# Patient Record
Sex: Female | Born: 1960 | Race: White | Hispanic: No | Marital: Married | Smoking: Former smoker
Health system: Southern US, Community
[De-identification: ages and names within clinical notes are randomized; demographics above are authoritative.]

## PROBLEM LIST (undated history)

## (undated) DIAGNOSIS — E039 Hypothyroidism, unspecified: Secondary | ICD-10-CM

## (undated) DIAGNOSIS — E079 Disorder of thyroid, unspecified: Secondary | ICD-10-CM

## (undated) DIAGNOSIS — D6800 Von Willebrand disease, unspecified: Secondary | ICD-10-CM

---

## 1993-12-20 HISTORY — PX: CYST REMOVAL NECK: SHX6281

## 1999-06-15 ENCOUNTER — Ambulatory Visit (HOSPITAL_COMMUNITY): Admission: RE | Admit: 1999-06-15 | Discharge: 1999-06-15 | Payer: Self-pay | Admitting: *Deleted

## 2000-01-29 ENCOUNTER — Inpatient Hospital Stay (HOSPITAL_COMMUNITY): Admission: AD | Admit: 2000-01-29 | Discharge: 2000-01-29 | Payer: Self-pay | Admitting: Gynecology

## 2001-10-11 ENCOUNTER — Other Ambulatory Visit: Admission: RE | Admit: 2001-10-11 | Discharge: 2001-10-11 | Payer: Self-pay | Admitting: Gynecology

## 2003-03-06 ENCOUNTER — Encounter: Admission: RE | Admit: 2003-03-06 | Discharge: 2003-03-06 | Payer: Self-pay | Admitting: Family Medicine

## 2003-03-06 ENCOUNTER — Encounter: Payer: Self-pay | Admitting: Family Medicine

## 2003-12-09 ENCOUNTER — Other Ambulatory Visit: Admission: RE | Admit: 2003-12-09 | Discharge: 2003-12-09 | Payer: Self-pay | Admitting: Gynecology

## 2005-03-24 ENCOUNTER — Other Ambulatory Visit: Admission: RE | Admit: 2005-03-24 | Discharge: 2005-03-24 | Payer: Self-pay | Admitting: Gynecology

## 2006-04-24 ENCOUNTER — Emergency Department (HOSPITAL_COMMUNITY): Admission: EM | Admit: 2006-04-24 | Discharge: 2006-04-24 | Payer: Self-pay | Admitting: Family Medicine

## 2006-05-24 ENCOUNTER — Other Ambulatory Visit: Admission: RE | Admit: 2006-05-24 | Discharge: 2006-05-24 | Payer: Self-pay | Admitting: Gynecology

## 2006-11-11 ENCOUNTER — Inpatient Hospital Stay (HOSPITAL_COMMUNITY): Admission: AD | Admit: 2006-11-11 | Discharge: 2006-11-11 | Payer: Self-pay | Admitting: Gynecology

## 2008-01-16 ENCOUNTER — Other Ambulatory Visit: Admission: RE | Admit: 2008-01-16 | Discharge: 2008-01-16 | Payer: Self-pay | Admitting: Gynecology

## 2009-03-12 ENCOUNTER — Encounter: Payer: Self-pay | Admitting: Cardiology

## 2009-03-12 ENCOUNTER — Ambulatory Visit: Payer: Self-pay

## 2009-03-14 ENCOUNTER — Ambulatory Visit: Payer: Self-pay | Admitting: Cardiology

## 2009-03-14 ENCOUNTER — Encounter: Payer: Self-pay | Admitting: Cardiology

## 2009-03-14 DIAGNOSIS — Z862 Personal history of diseases of the blood and blood-forming organs and certain disorders involving the immune mechanism: Secondary | ICD-10-CM | POA: Insufficient documentation

## 2009-03-14 DIAGNOSIS — Z8639 Personal history of other endocrine, nutritional and metabolic disease: Secondary | ICD-10-CM | POA: Insufficient documentation

## 2009-03-14 DIAGNOSIS — R002 Palpitations: Secondary | ICD-10-CM | POA: Insufficient documentation

## 2009-03-14 LAB — CONVERTED CEMR LAB: TSH: 0.75 microintl units/mL (ref 0.35–5.50)

## 2010-09-22 ENCOUNTER — Encounter: Admission: RE | Admit: 2010-09-22 | Discharge: 2010-09-22 | Payer: Self-pay | Admitting: Internal Medicine

## 2011-01-10 ENCOUNTER — Encounter: Payer: Self-pay | Admitting: Internal Medicine

## 2011-12-23 ENCOUNTER — Emergency Department (HOSPITAL_COMMUNITY): Payer: BC Managed Care – PPO

## 2011-12-23 ENCOUNTER — Emergency Department (HOSPITAL_COMMUNITY)
Admission: EM | Admit: 2011-12-23 | Discharge: 2011-12-23 | Disposition: A | Discharge status: Home / Self Care | Payer: BC Managed Care – PPO | Attending: Emergency Medicine | Admitting: Emergency Medicine

## 2011-12-23 ENCOUNTER — Encounter: Payer: Self-pay | Admitting: Emergency Medicine

## 2011-12-23 DIAGNOSIS — R6883 Chills (without fever): Secondary | ICD-10-CM | POA: Insufficient documentation

## 2011-12-23 DIAGNOSIS — R0602 Shortness of breath: Secondary | ICD-10-CM | POA: Insufficient documentation

## 2011-12-23 DIAGNOSIS — J3489 Other specified disorders of nose and nasal sinuses: Secondary | ICD-10-CM | POA: Insufficient documentation

## 2011-12-23 DIAGNOSIS — J329 Chronic sinusitis, unspecified: Secondary | ICD-10-CM | POA: Insufficient documentation

## 2011-12-23 DIAGNOSIS — R059 Cough, unspecified: Secondary | ICD-10-CM | POA: Insufficient documentation

## 2011-12-23 DIAGNOSIS — E079 Disorder of thyroid, unspecified: Secondary | ICD-10-CM | POA: Insufficient documentation

## 2011-12-23 DIAGNOSIS — J40 Bronchitis, not specified as acute or chronic: Secondary | ICD-10-CM

## 2011-12-23 DIAGNOSIS — R05 Cough: Secondary | ICD-10-CM | POA: Insufficient documentation

## 2011-12-23 DIAGNOSIS — R079 Chest pain, unspecified: Secondary | ICD-10-CM | POA: Insufficient documentation

## 2011-12-23 HISTORY — DX: Disorder of thyroid, unspecified: E07.9

## 2011-12-23 MED ORDER — PREDNISONE 10 MG PO TABS: 20.0000 mg | ORAL_TABLET | Freq: Every day | ORAL | Status: DC

## 2011-12-23 MED ORDER — PREDNISONE 20 MG PO TABS
60.0000 mg | ORAL_TABLET | Freq: Once | ORAL | Status: AC
  Administered 2011-12-23: 60 mg via ORAL
  Filled 2011-12-23: qty 3

## 2011-12-23 NOTE — ED Provider Notes (Signed)
History     CSN: [id number]  Arrival date & time 12/23/11  2130   First MD Initiated Contact with Patient 12/23/11 0715      Chief Complaint  Patient presents with  . Cough  . Nasal Congestion    (Consider location/radiation/quality/duration/timing/severity/associated sxs/prior treatment) Patient is a 51 y.o. female presenting with cough. The history is provided by the patient and the spouse.  Cough This is a recurrent problem. The current episode started more than 1 week ago. The problem occurs constantly. The problem has been gradually worsening. The cough is productive of sputum. The maximum temperature recorded prior to her arrival was 100 to 100.9 F. The fever has been present for 5 days or more. Associated symptoms include chest pain, chills and shortness of breath. Pertinent negatives include no ear pain, no headaches, no sore throat, no myalgias, no wheezing and no eye redness. Her past medical history is significant for bronchitis.    Past Medical History  Diagnosis Date  . Thyroid disease     History reviewed. No pertinent past surgical history.  No family history on file.  History  Substance Use Topics  . Smoking status: Not on file  . Smokeless tobacco: Not on file  . Alcohol Use:     OB History    Grav Para Term Preterm Abortions TAB SAB Ect Mult Living                  Review of Systems  Constitutional: Positive for chills.  HENT: Positive for congestion and sinus pressure. Negative for ear pain, sore throat and neck pain.   Eyes: Negative for redness.  Respiratory: Positive for cough and shortness of breath. Negative for wheezing.   Cardiovascular: Positive for chest pain.  Gastrointestinal: Negative for nausea, vomiting, abdominal pain and diarrhea.  Genitourinary: Negative for dysuria.  Musculoskeletal: Negative for myalgias and back pain.  Skin: Negative for rash.  Neurological: Negative for headaches.  Hematological: Does not bruise/bleed  easily.    Allergies  Levofloxacin and Sulfonamide derivatives  Home Medications   Current Outpatient Rx  Name Route Sig Dispense Refill  . ALBUTEROL SULFATE (2.5 MG/3ML) 0.083% IN NEBU Nebulization Take 2.5 mg by nebulization every 6 (six) hours as needed.      Marland Kitchen CIPROFLOXACIN HCL 500 MG PO TABS Oral Take 500 mg by mouth 2 (two) times daily.      Marland Kitchen PREDNISONE 10 MG PO TABS Oral Take 2 tablets (20 mg total) by mouth daily. 10 tablet 0    BP 129/81  Pulse 91  Temp(Src) 99 F (37.2 C) (Oral)  Resp 16  Wt 110 lb (49.896 kg)  SpO2 99%  Physical Exam  Nursing note and vitals reviewed. Constitutional: She is oriented to person, place, and time. She appears well-developed and well-nourished.  HENT:  Head: Normocephalic and atraumatic.  Mouth/Throat: Oropharynx is clear and moist.  Eyes: Conjunctivae and EOM are normal. Pupils are equal, round, and reactive to light.  Neck: Normal range of motion. Neck supple.  Cardiovascular: Normal rate, regular rhythm and normal heart sounds.   No murmur heard. Pulmonary/Chest: Effort normal and breath sounds normal. She has no wheezes.  Abdominal: Soft. Bowel sounds are normal. There is no tenderness.  Musculoskeletal: Normal range of motion.  Neurological: She is alert and oriented to person, place, and time. No cranial nerve deficit. She exhibits normal muscle tone. Coordination normal.  Skin: Skin is warm. No rash noted.    ED Course  Procedures (including  critical care time)  Labs Reviewed - No data to display Dg Chest 2 View  12/23/2011  *RADIOLOGY REPORT*  Clinical Data: Congestion.  CHEST - 2 VIEW  Comparison: None.  Findings: A mild pectus excavatum deformity. Midline trachea. Normal heart size and mediastinal contours.  Patient minimally rotated to the left. No pleural effusion or pneumothorax.  Diffuse peribronchial thickening.  Clear lungs.  IMPRESSION: 1.  No acute cardiopulmonary disease. 2.  Mild peribronchial thickening which  may relate to chronic bronchitis or smoking.  Original Report Authenticated By: Consuello Bossier, M.D.     1. Bronchitis   2. Sinusitis       MDM   Patient with persistent cough and sinusitis like symptoms since Thanksgiving has been on multiple antibiotics since that time, to include Z-Pak doxycycline and Augmentin and currently now Cipro. 2 weeks ago had a course of prednisone for 5 days. Seen by her primary care provider on Monday and started on Cipro and got a shot of steroid. Patient presented today because she had significant persistent coughing overnight and was having trouble getting her breath. He has recently in the last 5-7 days started with fever and chills. No significant bodyaches no nausea vomiting or diarrhea. Chest x-ray today is negative for pneumonia. Patient already using albuterol inhaler. Suspect persistent bronchitis due to the duration possible consideration for pertussis patient will follow up with her primary care provider for this. We'll give second course of prednisone starting today 60 mg by mouth given in the emergency department a five-day course take at home. Patient using Tussionex, will consider using Robitussin-DM in place of that.        Shelda Jakes, MD 12/23/11 [phone]

## 2011-12-23 NOTE — ED Notes (Signed)
Pt alert, nad, c/o cough, congestion, seen PCP on Monday, placed on Cipro, breathing tx tid, resp even unlabored, skin pwd, moist npc noted

## 2016-05-31 ENCOUNTER — Institutional Professional Consult (permissible substitution): Payer: Self-pay | Admitting: Pulmonary Disease

## 2016-07-13 ENCOUNTER — Encounter: Payer: Self-pay | Admitting: Pulmonary Disease

## 2016-07-13 ENCOUNTER — Ambulatory Visit (INDEPENDENT_AMBULATORY_CARE_PROVIDER_SITE_OTHER): Payer: Self-pay | Admitting: Pulmonary Disease

## 2016-07-13 VITALS — BP 114/76 | HR 66 | Ht 63.75 in

## 2016-07-13 DIAGNOSIS — R05 Cough: Secondary | ICD-10-CM

## 2016-07-13 DIAGNOSIS — R490 Dysphonia: Secondary | ICD-10-CM

## 2016-07-13 DIAGNOSIS — J4520 Mild intermittent asthma, uncomplicated: Secondary | ICD-10-CM | POA: Insufficient documentation

## 2016-07-13 DIAGNOSIS — R059 Cough, unspecified: Secondary | ICD-10-CM | POA: Insufficient documentation

## 2016-07-13 NOTE — Assessment & Plan Note (Signed)
Because of her lengthy smoking history and the hoarseness she has been experiencing in the last several months I would like for her to see her nose and throat to evaluate her larynx. This may also shed some light on the reason why her cough persists for so long.

## 2016-07-13 NOTE — Patient Instructions (Signed)
We will refer you to ENT to evaluate the hoarseness We will call you with the results of the CT chest We will see you back in 6-8 weeks or sooner if needed

## 2016-07-13 NOTE — Progress Notes (Signed)
Subjective:    Patient ID: Rachel Lucas, female    DOB: Sep 01, 1961, 55 y.o.   MRN: [id number]  HPI Chief Complaint  Patient presents with  . Advice Only    referred by Dr. Daleen Squibb for chronic cough.  pt states s/s have been present intermittently Xapprox 2 years.     Rachel Lucas has been referred to me by Dr. Daleen Squibb for recurrent cough.  She says that whenever she gets a cold she will end up having "bronchitis" which she describes as having chest tightness some mucus production and a frequent persistent cough.  She recently had a strep throat infection which eventually led to a cough with ongoing mucus production. This is emblematic of multiple recent episode she's had over the years where she will get an upper respiratory infection that leads to cough.  She feels OK between episodes of cough.  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  Used to smoke 3 cigarettes a day smoked from 25 years, quit 1991; never smoked 3-4 ciegarettes a day.  She had "croup" when she was a kid and she has had recurrent cough ever since.  She was hospitalized and was in an oxygen tent for this around age [age].  She has a family history of asthma in her mother, some other distant relatives with asthma.  She does not have a diagnosis of asthma but she has been given albuterol in the past to help with the cough. It works, but she doesn't like the way it makes her feel.    She will sometimes feel "panicky" when her throat is full of mucus.  She has been hoarse for the last 4-6 months or so.    Past Medical History:  Diagnosis Date  . Thyroid disease      Family History  Problem Relation Age of Onset  . Asthma Mother   . Eczema Mother      Social History   Social History  . Marital status: Married    Spouse name: N/A  . Number of children: N/A  . Years of education: N/A   Occupational History  . Not on file.   Social History Main Topics  . Smoking  status: Former Smoker    Packs/day: 0.20    Years: 10.00    Types: Cigarettes    Quit date: 12/20/1989  . Smokeless tobacco: Not on file     Comment: Pt also notes passive exposure- father smoked in house growing up.   . Alcohol use Not on file  . Drug use: Unknown  . Sexual activity: Not on file   Other Topics Concern  . Not on file   Social History Narrative  . No narrative on file     Allergies  Allergen Reactions  . Levofloxacin   . Sulfonamide Derivatives      Outpatient Medications Prior to Visit  Medication Sig Dispense Refill  . albuterol (PROVENTIL) (2.5 MG/3ML) 0.083% nebulizer solution Take 2.5 mg by nebulization every 6 (six) hours as needed.      . ciprofloxacin (CIPRO) 500 MG tablet Take 500 mg by mouth 2 (two) times daily.      . predniSONE (DELTASONE) 10 MG tablet Take 2 tablets (20 mg total) by mouth daily. 10 tablet 0   No facility-administered medications prior to visit.       Review of Systems  Constitutional: Negative for fever and unexpected weight change.  HENT: Negative for congestion, dental problem, ear  pain, nosebleeds, postnasal drip, rhinorrhea, sinus pressure, sneezing, sore throat and trouble swallowing.   Eyes: Negative for redness and itching.  Respiratory: Negative for cough, chest tightness, shortness of breath and wheezing.   Cardiovascular: Negative for palpitations and leg swelling.  Gastrointestinal: Negative for nausea and vomiting.  Genitourinary: Negative for dysuria.  Musculoskeletal: Negative for joint swelling.  Skin: Negative for rash.  Neurological: Negative for headaches.  Hematological: Does not bruise/bleed easily.  Psychiatric/Behavioral: Negative for dysphoric mood. The patient is not nervous/anxious.        Objective:   Physical Exam  Vitals:   07/13/16 1448  BP: 114/76  Pulse: 66  SpO2: 98%  Height: 5' 3.75" (1.619 m)   RA  Gen: well appearing, no acute distress HENT: NCAT, OP clear, neck supple  without masses Eyes: PERRL, EOMi Lymph: no cervical lymphadenopathy PULM: CTA B CV: RRR, no mgr, no JVD GI: BS+, soft, nontender, no hsm Derm: no rash or skin breakdown MSK: normal bulk and tone Neuro: A&Ox4, CN II-XII intact, strength 5/5 in all 4 extremities Psyche: normal mood and affect  2013 chest x-ray images personally reviewed showing normal-appearing lung parenchyma with the exception of some mild pulmonary wall thickening.        Assessment & Plan:  Cough She describes years of persistent coughing episodes after upper respiratory infections. Objectively her lungs are clear to auscultation but she has a chest x-ray from 2013 which shows some airway thickening.  It's difficult to say that she has an underlying lung condition contributing, but with her history of a severe childhood respiratory illness and the abnormal chest x-ray I do worry about the possibility of bronchiectasis. I also think that there is a significant amount of postnasal drip contributing to the duration of her cough.  Given the recent hoarseness, with her distant smoking history I think it's reasonable for her to have an ear nose and throat evaluation.  She has no dyspnea or wheeze in between these episodes so I do not strongly suspect an underlying lung disease like COPD.  Plan: High-resolution CT scan of the chest to evaluate for bronchiectasis See hoarseness below  Hoarseness Because of her lengthy smoking history and the hoarseness she has been experiencing in the last several months I would like for her to see her nose and throat to evaluate her larynx. This may also shed some light on the reason why her cough persists for so long.    Current Outpatient Prescriptions:  .  estradiol (VIVELLE-DOT) 0.05 MG/24HR patch, Place 1 patch onto the skin 2 (two) times a week., Disp: , Rfl:  .  levothyroxine (SYNTHROID) 88 MCG tablet, Take 88 mcg by mouth daily before breakfast., Disp: , Rfl:  .   progesterone (PROMETRIUM) 200 MG capsule, Take 200 mg by mouth daily., Disp: , Rfl:

## 2016-07-13 NOTE — Assessment & Plan Note (Signed)
She describes years of persistent coughing episodes after upper respiratory infections. Objectively her lungs are clear to auscultation but she has a chest x-ray from 2013 which shows some airway thickening.  It's difficult to say that she has an underlying lung condition contributing, but with her history of a severe childhood respiratory illness and the abnormal chest x-ray I do worry about the possibility of bronchiectasis. I also think that there is a significant amount of postnasal drip contributing to the duration of her cough.  Given the recent hoarseness, with her distant smoking history I think it's reasonable for her to have an ear nose and throat evaluation.  She has no dyspnea or wheeze in between these episodes so I do not strongly suspect an underlying lung disease like COPD.  Plan: High-resolution CT scan of the chest to evaluate for bronchiectasis See hoarseness below

## 2016-07-19 ENCOUNTER — Encounter (INDEPENDENT_AMBULATORY_CARE_PROVIDER_SITE_OTHER): Payer: Self-pay

## 2016-07-19 ENCOUNTER — Ambulatory Visit (INDEPENDENT_AMBULATORY_CARE_PROVIDER_SITE_OTHER)
Admission: RE | Admit: 2016-07-19 | Discharge: 2016-07-19 | Disposition: A | Discharge status: Home / Self Care | Payer: 59 | Source: Ambulatory Visit | Attending: Pulmonary Disease | Admitting: Pulmonary Disease

## 2016-07-19 ENCOUNTER — Inpatient Hospital Stay: Admission: RE | Admit: 2016-07-19 | Payer: Self-pay | Source: Ambulatory Visit

## 2016-07-19 DIAGNOSIS — R05 Cough: Secondary | ICD-10-CM

## 2016-07-19 DIAGNOSIS — R059 Cough, unspecified: Secondary | ICD-10-CM

## 2016-07-22 ENCOUNTER — Telehealth: Payer: Self-pay | Admitting: Pulmonary Disease

## 2016-07-22 DIAGNOSIS — R05 Cough: Secondary | ICD-10-CM

## 2016-07-22 DIAGNOSIS — R059 Cough, unspecified: Secondary | ICD-10-CM

## 2016-07-22 NOTE — Telephone Encounter (Signed)
Called and spoke with pt and she is aware of ct results per BQ.  Pt requested that a copy of this be mailed to her and this has been done.  I have ordered her PFT and pt is aware that we will call her to schedule this.

## 2016-07-22 NOTE — Telephone Encounter (Signed)
[phone] pt calling back very nervous

## 2016-08-19 ENCOUNTER — Ambulatory Visit (HOSPITAL_COMMUNITY)
Admission: RE | Admit: 2016-08-19 | Discharge: 2016-08-19 | Disposition: A | Discharge status: Home / Self Care | Payer: 59 | Source: Ambulatory Visit | Attending: Pulmonary Disease | Admitting: Pulmonary Disease

## 2016-08-19 DIAGNOSIS — R059 Cough, unspecified: Secondary | ICD-10-CM

## 2016-08-19 DIAGNOSIS — R05 Cough: Secondary | ICD-10-CM | POA: Insufficient documentation

## 2016-08-19 LAB — PULMONARY FUNCTION TEST
DL/VA % PRED: 123 %
DL/VA: 5.76 ml/min/mmHg/L
DLCO UNC % PRED: 108 %
DLCO UNC: 24.89 ml/min/mmHg
FEF 25-75 POST: 2.5 L/s
FEF 25-75 PRE: 1.82 L/s
FEF2575-%Change-Post: 37 %
FEF2575-%PRED-PRE: 70 %
FEF2575-%Pred-Post: 96 %
FEV1-%Change-Post: 9 %
FEV1-%PRED-POST: 93 %
FEV1-%Pred-Pre: 85 %
FEV1-Post: 2.49 L
FEV1-Pre: 2.27 L
FEV1FVC-%Change-Post: 4 %
FEV1FVC-%PRED-PRE: 95 %
FEV6-%CHANGE-POST: 4 %
FEV6-%Pred-Post: 95 %
FEV6-%Pred-Pre: 91 %
FEV6-POST: 3.12 L
FEV6-Pre: 2.97 L
FEV6FVC-%CHANGE-POST: 0 %
FEV6FVC-%PRED-POST: 102 %
FEV6FVC-%Pred-Pre: 103 %
FVC-%Change-Post: 4 %
FVC-%PRED-PRE: 88 %
FVC-%Pred-Post: 92 %
FVC-POST: 3.12 L
FVC-PRE: 2.97 L
PRE FEV1/FVC RATIO: 76 %
Post FEV1/FVC ratio: 80 %
Post FEV6/FVC ratio: 100 %
Pre FEV6/FVC Ratio: 100 %
RV % pred: 79 %
RV: 1.42 L
TLC % PRED: 90 %
TLC: 4.43 L

## 2016-08-19 MED ORDER — ALBUTEROL SULFATE (2.5 MG/3ML) 0.083% IN NEBU
2.5000 mg | INHALATION_SOLUTION | Freq: Once | RESPIRATORY_TRACT | Status: AC
  Administered 2016-08-19: 2.5 mg via RESPIRATORY_TRACT

## 2016-08-30 ENCOUNTER — Encounter (INDEPENDENT_AMBULATORY_CARE_PROVIDER_SITE_OTHER): Payer: Self-pay

## 2016-08-30 ENCOUNTER — Encounter: Payer: Self-pay | Admitting: Pulmonary Disease

## 2016-08-30 ENCOUNTER — Ambulatory Visit (INDEPENDENT_AMBULATORY_CARE_PROVIDER_SITE_OTHER): Payer: 59 | Admitting: Pulmonary Disease

## 2016-08-30 VITALS — BP 132/72 | HR 62 | Ht 63.75 in

## 2016-08-30 DIAGNOSIS — R059 Cough, unspecified: Secondary | ICD-10-CM

## 2016-08-30 DIAGNOSIS — R05 Cough: Secondary | ICD-10-CM | POA: Diagnosis not present

## 2016-08-30 DIAGNOSIS — R0602 Shortness of breath: Secondary | ICD-10-CM

## 2016-08-30 DIAGNOSIS — J4520 Mild intermittent asthma, uncomplicated: Secondary | ICD-10-CM

## 2016-08-30 MED ORDER — ALBUTEROL SULFATE HFA 108 (90 BASE) MCG/ACT IN AERS
2.0000 | INHALATION_SPRAY | Freq: Four times a day (QID) | RESPIRATORY_TRACT | 6 refills | Status: DC | PRN
Start: 2016-08-30 — End: 2024-08-08

## 2016-08-30 MED ORDER — ALBUTEROL SULFATE (2.5 MG/3ML) 0.083% IN NEBU: 2.5000 mg | INHALATION_SOLUTION | Freq: Four times a day (QID) | RESPIRATORY_TRACT | 5 refills | Status: DC | PRN

## 2016-08-30 NOTE — Patient Instructions (Signed)
If you get a simple upper respiratory infection (cough or cold without significant mucus production, fever, or shortness of breath): for sinus congestion I recommend that she use saline rinses and or phenylephrine  For cough I recommend that she take dextromethorphan twice a day  If your cough does not improve with these measures then call us and we can call in something like Tessalon to help  When you are ill I recommend that you use albuterol as needed for chest tightness or shortness of breath  We will see back in 6 months or sooner if needed

## 2016-08-30 NOTE — Assessment & Plan Note (Signed)
Her lung function testing from this year showed mild small airways obstruction that improved with a bronchodilator and her high-resolution CT scan of the chest also was suggestive of small airways disease. Given the intermittent nature of her symptoms I think this is most consistent with mild intermittent asthma.  Her symptoms only seem to flareup when she hasn't acute cough or cold.  Plan: Use albuterol as needed for shortness of breath or chest tightness Refused Flu shot

## 2016-08-30 NOTE — Progress Notes (Signed)
   Subjective:    Patient ID: Rachel Lucas, female    DOB: 02/26/1961, 55 y.o.   MRN: [id number]  Synopsis: Referred in July 2017 for chronic cough. High-resolution CT scan of the chest showed no evidence of interstitial lung disease or bronchiectasis, there was a suggestion of small airways disease Lung function testing from August 2017 showed small airway obstruction change with bronchodilator only  HPI  Chief Complaint  Patient presents with  . Follow-up    review CT, pft, and ENT visit.  Pt's cough has improved since last ov.    She has been doing well since the last visit, no cough, cold, or shortness of breath. She saw ear nose and throat and they said that there is a small nodule on her larynx but nothing else. She has not had a cough or cold since the last visit.   Past Medical History:  Diagnosis Date  . Thyroid disease      Review of Systems     Objective:   Physical Exam Vitals:   08/30/16 1418  BP: 132/72  Pulse: 62  SpO2: 100%  Height: 5' 3.75" (1.619 m)   RA  Gen: well appearing HENT: OP clear, TM's clear, neck supple PULM: CTA B, normal percussion CV: RRR, no mgr, trace edema GI: BS+, soft, nontender Derm: no cyanosis or rash Psyche: normal mood and affect        Assessment & Plan:  Mild intermittent asthma Her lung function testing from this year showed mild small airways obstruction that improved with a bronchodilator and her high-resolution CT scan of the chest also was suggestive of small airways disease. Given the intermittent nature of her symptoms I think this is most consistent with mild intermittent asthma.  Her symptoms only seem to flareup when she hasn't acute cough or cold.  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 Today we talked about whenever she gets a cough or cold she should take  over-the-counter cold remedies to treat sinus congestion or cough which may contribute to ongoing irritation and inflammation in her vocal cords. Fortunately, the ear nose and throat evaluation showed no evidence of vocal cord pathology.      Current Outpatient Prescriptions:  .  estradiol (VIVELLE-DOT) 0.05 MG/24HR patch, Place 1 patch onto the skin 2 (two) times a week., Disp: , Rfl:  .  levothyroxine (SYNTHROID) 88 MCG tablet, Take 88 mcg by mouth daily before breakfast., Disp: , Rfl:  .  progesterone (PROMETRIUM) 200 MG capsule, Take 200 mg by mouth daily., Disp: , Rfl:  .  albuterol (PROVENTIL HFA;VENTOLIN HFA) 108 (90 Base) MCG/ACT inhaler, Inhale 2 puffs into the lungs every 6 (six) hours as needed for wheezing or shortness of breath., Disp: 1 Inhaler, Rfl: 6 .  albuterol (PROVENTIL) (2.5 MG/3ML) 0.083% nebulizer solution, Take 3 mLs (2.5 mg total) by nebulization every 6 (six) hours as needed for wheezing or shortness of breath., Disp: 360 mL, Rfl: 5

## 2016-08-30 NOTE — Assessment & Plan Note (Signed)
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 Today we talked about whenever she gets a cough or cold she should take over-the-counter cold remedies to treat sinus congestion or cough which may contribute to ongoing irritation and inflammation in her vocal cords. Fortunately, the ear nose and throat evaluation showed no evidence of vocal cord pathology.

## 2016-08-30 NOTE — Addendum Note (Signed)
Addended by: Velvet BatheAULFIELD, ASHLEY L on: 08/30/2016 03:37 PM   Modules accepted: Orders

## 2016-09-29 ENCOUNTER — Telehealth: Payer: Self-pay | Admitting: Pulmonary Disease

## 2016-09-29 MED ORDER — AZITHROMYCIN 250 MG PO TABS: ORAL_TABLET | ORAL | 0 refills | Status: DC

## 2016-09-29 NOTE — Telephone Encounter (Signed)
lmtcb X1 for pt to make aware of recs. [facility]

## 2016-09-29 NOTE — Telephone Encounter (Signed)
Pt aware of rx sent and nothing more needed at this time.

## 2016-09-29 NOTE — Telephone Encounter (Signed)
Spoke with the pt  She states developed a cold a few wks ago and is having some facial pressure for the past few days  She is coughing with yellow sputum  She is using saline spray and otc cold med (unsure of name)  With no relief  She states that she believes she has sinus infection b/c her teeth and face hurt and wants abx called in  Please advise thanks!

## 2016-09-29 NOTE — Telephone Encounter (Signed)
zpack

## 2017-02-15 ENCOUNTER — Encounter: Payer: Self-pay | Admitting: Obstetrics and Gynecology

## 2017-05-08 IMAGING — CT CT CHEST HIGH RESOLUTION W/O CM
2 of 6 series · 15 of 36 positions shown, 18 images · non-contrast
Comparison: No priors.

CLINICAL DATA: 54-year-old female with history of cough for 1 year.
Prior history of croup. Former smoker.

EXAM:
CT CHEST WITHOUT CONTRAST
TECHNIQUE: Multidetector CT imaging of the chest was performed following the
standard protocol without intravenous contrast. High resolution
imaging of the lungs, as well as inspiratory and expiratory imaging,
was performed.

[Series 4: high resolution · axial · 0.58mm/px · z∈[+1080,+1340]mm · 12 of 148 slices shown, 15 images]
[im 9/148  mediastinal]
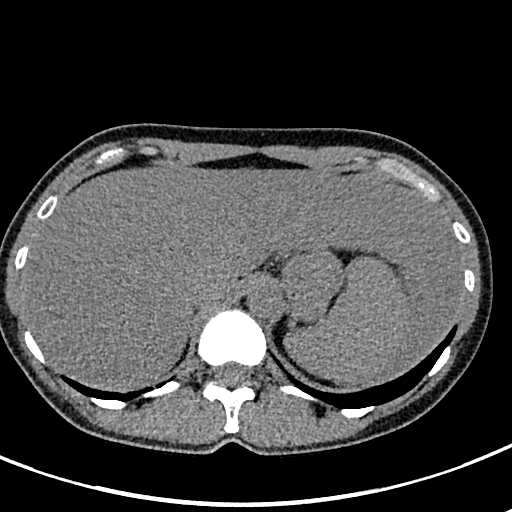
[im 9/148  lung]
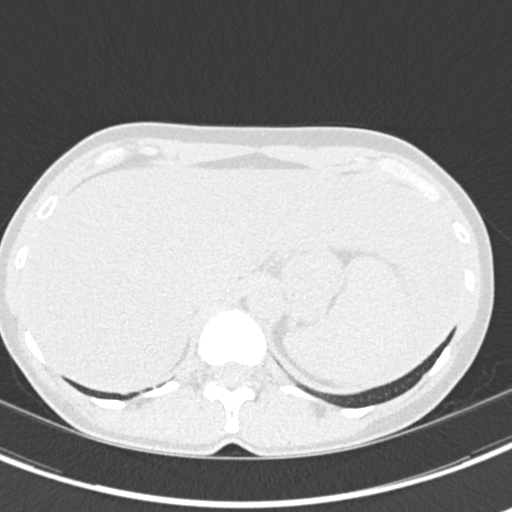
[im 25/148  lung]
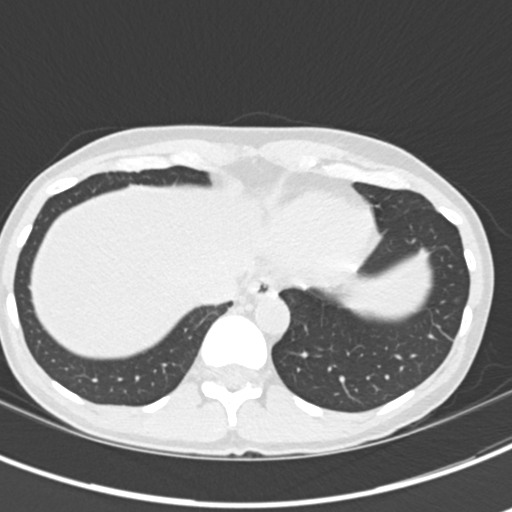
[im 33/148  lung]
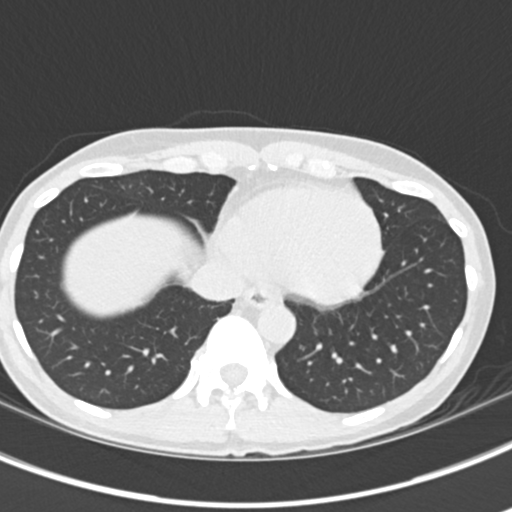
[im 41/148  lung]
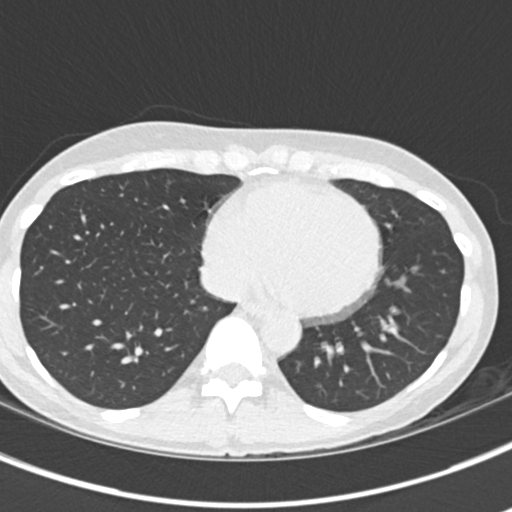
[im 58/148  mediastinal]
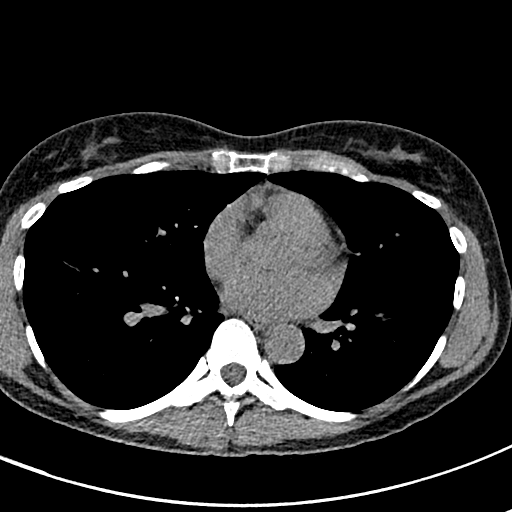
[im 58/148  lung]
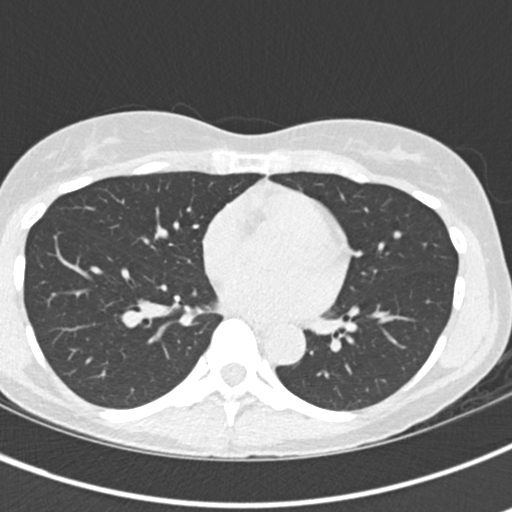
[im 66/148  lung]
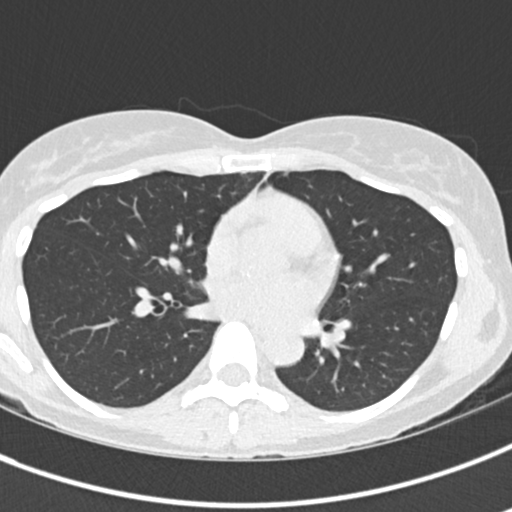
[im 82/148  lung]
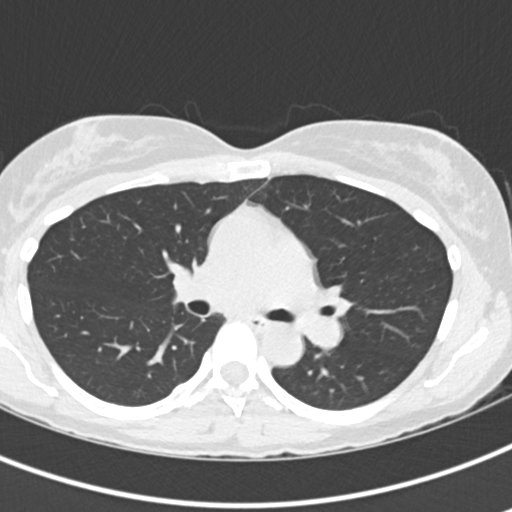
[im 90/148  lung]
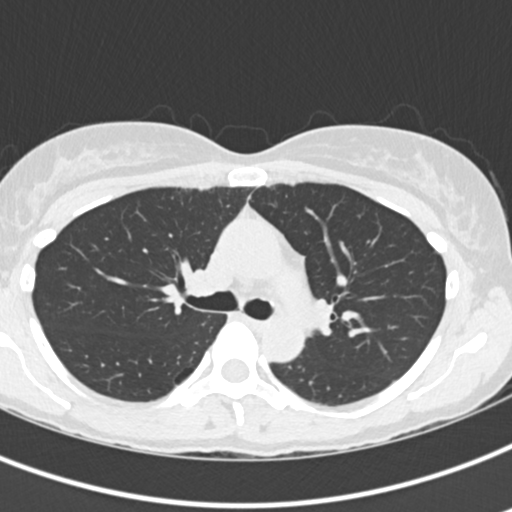
[im 107/148  mediastinal]
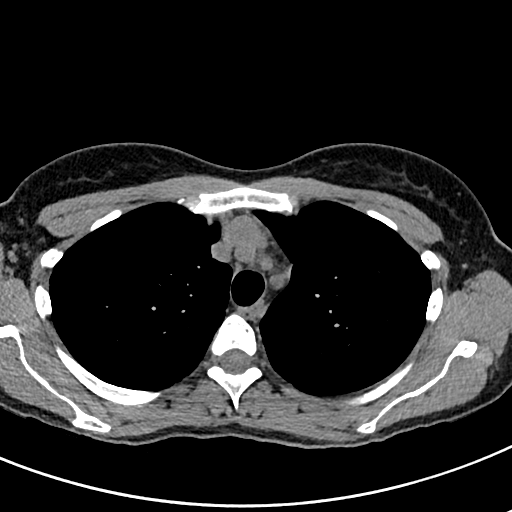
[im 107/148  lung]
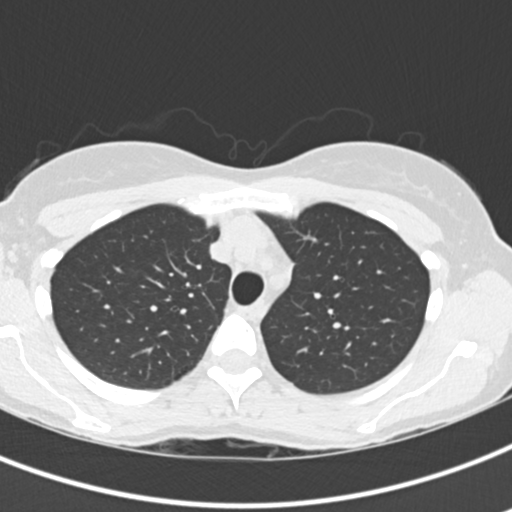
[im 115/148  lung]
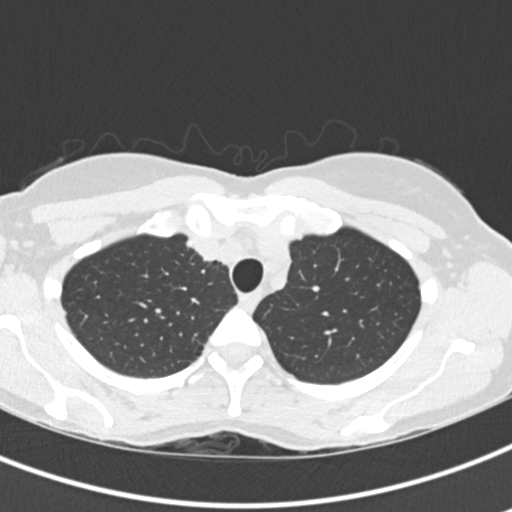
[im 123/148  lung]
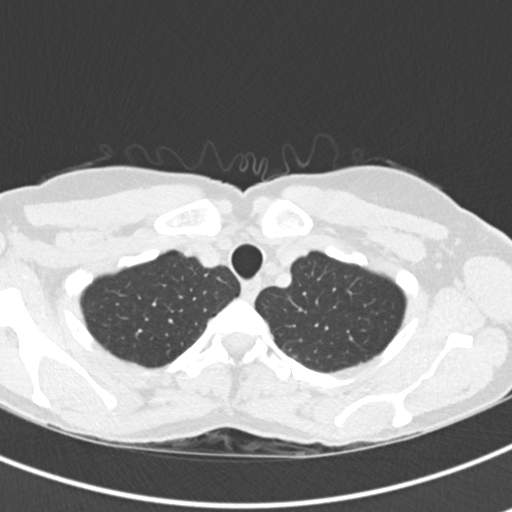
[im 139/148  lung]
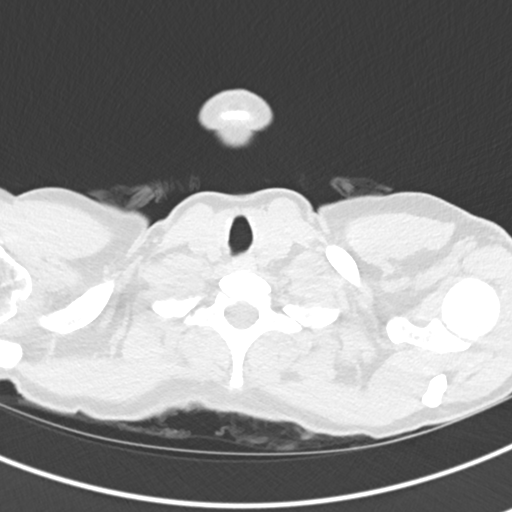

[Series 7: coronal · coronal · 0.57mm/px · 3 of 92 slices shown]
[im 19/92  lung]
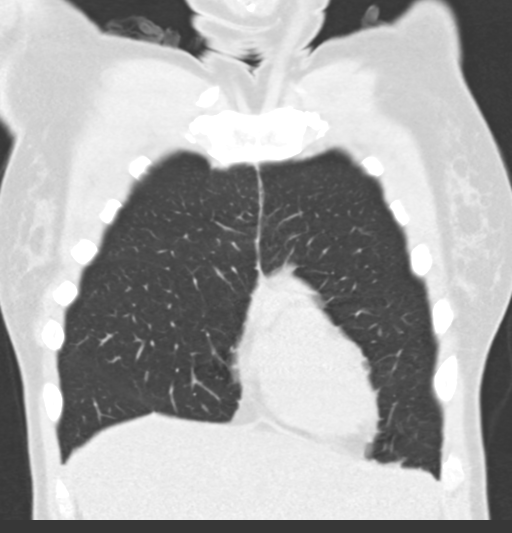
[im 37/92  lung]
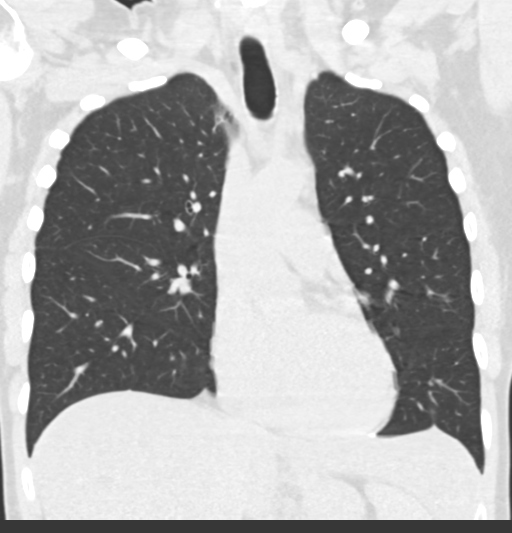
[im 55/92  lung]
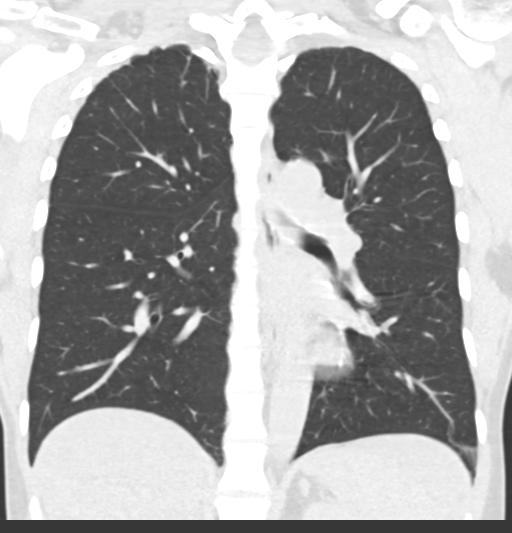

[15 of 36 positions shown; findings below may reference images not displayed]

FINDINGS: Cardiovascular: Heart size is normal. There is no significant
pericardial fluid, thickening or pericardial calcification.
Atherosclerotic calcification noted in the proximal innominate
artery. No coronary artery calcifications are identified.

Mediastinum/Nodes: No pathologically enlarged mediastinal or hilar
lymph nodes. Please note that accurate exclusion of hilar adenopathy
is limited on noncontrast CT scans. Esophagus is unremarkable in
appearance. No axillary lymphadenopathy.

Lungs/Pleura: High-resolution images demonstrate no significant
regions of ground-glass attenuation, subpleural reticulation,
parenchymal banding, traction bronchiectasis or frank honeycombing.
Inspiratory and expiratory imaging demonstrates some mild air
trapping, indicative of mild small airways disease. No acute
consolidative airspace disease. No pleural effusions. No suspicious
appearing pulmonary nodules or masses.

Upper Abdomen: Diffuse low attenuation throughout the visualized
hepatic parenchyma, indicative of hepatic steatosis.

Musculoskeletal: There are no aggressive appearing lytic or blastic
lesions noted in the visualized portions of the skeleton.
IMPRESSION: 1. No findings to suggest interstitial lung disease.
2. Mild air trapping, indicative of mild small airways disease.
3. Mild hepatic steatosis.
4. Atherosclerosis (mild).

## 2017-07-25 ENCOUNTER — Telehealth (INDEPENDENT_AMBULATORY_CARE_PROVIDER_SITE_OTHER): Payer: Self-pay | Admitting: "Endocrinology

## 2017-07-27 NOTE — Telephone Encounter (Signed)
Open wrong patient

## 2018-03-04 ENCOUNTER — Other Ambulatory Visit: Payer: Self-pay | Admitting: Pulmonary Disease

## 2022-03-04 DIAGNOSIS — H04123 Dry eye syndrome of bilateral lacrimal glands: Secondary | ICD-10-CM | POA: Diagnosis not present

## 2022-03-04 DIAGNOSIS — H1789 Other corneal scars and opacities: Secondary | ICD-10-CM | POA: Diagnosis not present

## 2022-03-04 DIAGNOSIS — H5203 Hypermetropia, bilateral: Secondary | ICD-10-CM | POA: Diagnosis not present

## 2022-11-15 DIAGNOSIS — H04122 Dry eye syndrome of left lacrimal gland: Secondary | ICD-10-CM | POA: Diagnosis not present

## 2022-11-15 DIAGNOSIS — H18592 Other hereditary corneal dystrophies, left eye: Secondary | ICD-10-CM | POA: Diagnosis not present

## 2022-11-15 DIAGNOSIS — H0100A Unspecified blepharitis right eye, upper and lower eyelids: Secondary | ICD-10-CM | POA: Diagnosis not present

## 2023-01-06 DIAGNOSIS — E039 Hypothyroidism, unspecified: Secondary | ICD-10-CM | POA: Diagnosis not present

## 2023-01-06 DIAGNOSIS — E7800 Pure hypercholesterolemia, unspecified: Secondary | ICD-10-CM | POA: Diagnosis not present

## 2023-01-06 DIAGNOSIS — Z131 Encounter for screening for diabetes mellitus: Secondary | ICD-10-CM | POA: Diagnosis not present

## 2023-01-06 DIAGNOSIS — Z87891 Personal history of nicotine dependence: Secondary | ICD-10-CM | POA: Diagnosis not present

## 2023-03-21 DIAGNOSIS — H1789 Other corneal scars and opacities: Secondary | ICD-10-CM | POA: Diagnosis not present

## 2023-03-21 DIAGNOSIS — H5203 Hypermetropia, bilateral: Secondary | ICD-10-CM | POA: Diagnosis not present

## 2024-08-08 ENCOUNTER — Inpatient Hospital Stay (HOSPITAL_BASED_OUTPATIENT_CLINIC_OR_DEPARTMENT_OTHER)
Admission: EM | Admit: 2024-08-08 | Discharge: 2024-08-15 | DRG: 432 | Disposition: A | Discharge status: Home / Self Care | Attending: Internal Medicine | Admitting: Internal Medicine

## 2024-08-08 ENCOUNTER — Encounter (HOSPITAL_COMMUNITY): Payer: Self-pay | Admitting: Internal Medicine

## 2024-08-08 ENCOUNTER — Emergency Department (HOSPITAL_BASED_OUTPATIENT_CLINIC_OR_DEPARTMENT_OTHER)

## 2024-08-08 ENCOUNTER — Other Ambulatory Visit: Payer: Self-pay

## 2024-08-08 ENCOUNTER — Inpatient Hospital Stay (HOSPITAL_COMMUNITY)

## 2024-08-08 DIAGNOSIS — Y848 Other medical procedures as the cause of abnormal reaction of the patient, or of later complication, without mention of misadventure at the time of the procedure: Secondary | ICD-10-CM | POA: Diagnosis not present

## 2024-08-08 DIAGNOSIS — B9689 Other specified bacterial agents as the cause of diseases classified elsewhere: Secondary | ICD-10-CM | POA: Diagnosis present

## 2024-08-08 DIAGNOSIS — K7040 Alcoholic hepatic failure without coma: Secondary | ICD-10-CM | POA: Diagnosis not present

## 2024-08-08 DIAGNOSIS — E872 Acidosis, unspecified: Secondary | ICD-10-CM | POA: Diagnosis not present

## 2024-08-08 DIAGNOSIS — R188 Other ascites: Secondary | ICD-10-CM | POA: Diagnosis not present

## 2024-08-08 DIAGNOSIS — T8089XA Other complications following infusion, transfusion and therapeutic injection, initial encounter: Secondary | ICD-10-CM | POA: Diagnosis not present

## 2024-08-08 DIAGNOSIS — E785 Hyperlipidemia, unspecified: Secondary | ICD-10-CM | POA: Diagnosis present

## 2024-08-08 DIAGNOSIS — D684 Acquired coagulation factor deficiency: Secondary | ICD-10-CM | POA: Diagnosis not present

## 2024-08-08 DIAGNOSIS — D696 Thrombocytopenia, unspecified: Secondary | ICD-10-CM

## 2024-08-08 DIAGNOSIS — K7460 Unspecified cirrhosis of liver: Secondary | ICD-10-CM | POA: Diagnosis not present

## 2024-08-08 DIAGNOSIS — Z881 Allergy status to other antibiotic agents status: Secondary | ICD-10-CM

## 2024-08-08 DIAGNOSIS — D689 Coagulation defect, unspecified: Secondary | ICD-10-CM | POA: Diagnosis not present

## 2024-08-08 DIAGNOSIS — K7290 Hepatic failure, unspecified without coma: Secondary | ICD-10-CM | POA: Diagnosis not present

## 2024-08-08 DIAGNOSIS — Z79899 Other long term (current) drug therapy: Secondary | ICD-10-CM

## 2024-08-08 DIAGNOSIS — H9222 Otorrhagia, left ear: Secondary | ICD-10-CM | POA: Diagnosis present

## 2024-08-08 DIAGNOSIS — Z832 Family history of diseases of the blood and blood-forming organs and certain disorders involving the immune mechanism: Secondary | ICD-10-CM

## 2024-08-08 DIAGNOSIS — K767 Hepatorenal syndrome: Secondary | ICD-10-CM | POA: Diagnosis present

## 2024-08-08 DIAGNOSIS — Z7141 Alcohol abuse counseling and surveillance of alcoholic: Secondary | ICD-10-CM

## 2024-08-08 DIAGNOSIS — M799 Soft tissue disorder, unspecified: Secondary | ICD-10-CM | POA: Diagnosis not present

## 2024-08-08 DIAGNOSIS — E876 Hypokalemia: Secondary | ICD-10-CM | POA: Diagnosis not present

## 2024-08-08 DIAGNOSIS — B9620 Unspecified Escherichia coli [E. coli] as the cause of diseases classified elsewhere: Secondary | ICD-10-CM | POA: Diagnosis present

## 2024-08-08 DIAGNOSIS — K5900 Constipation, unspecified: Secondary | ICD-10-CM | POA: Diagnosis present

## 2024-08-08 DIAGNOSIS — R509 Fever, unspecified: Secondary | ICD-10-CM | POA: Diagnosis not present

## 2024-08-08 DIAGNOSIS — E039 Hypothyroidism, unspecified: Secondary | ICD-10-CM | POA: Diagnosis present

## 2024-08-08 DIAGNOSIS — I8500 Esophageal varices without bleeding: Secondary | ICD-10-CM | POA: Diagnosis not present

## 2024-08-08 DIAGNOSIS — I864 Gastric varices: Secondary | ICD-10-CM | POA: Diagnosis not present

## 2024-08-08 DIAGNOSIS — K7031 Alcoholic cirrhosis of liver with ascites: Secondary | ICD-10-CM | POA: Diagnosis present

## 2024-08-08 DIAGNOSIS — K7689 Other specified diseases of liver: Secondary | ICD-10-CM | POA: Diagnosis not present

## 2024-08-08 DIAGNOSIS — E871 Hypo-osmolality and hyponatremia: Secondary | ICD-10-CM | POA: Diagnosis present

## 2024-08-08 DIAGNOSIS — K7010 Alcoholic hepatitis without ascites: Secondary | ICD-10-CM

## 2024-08-08 DIAGNOSIS — Z87891 Personal history of nicotine dependence: Secondary | ICD-10-CM

## 2024-08-08 DIAGNOSIS — H7292 Unspecified perforation of tympanic membrane, left ear: Secondary | ICD-10-CM | POA: Diagnosis not present

## 2024-08-08 DIAGNOSIS — N390 Urinary tract infection, site not specified: Secondary | ICD-10-CM | POA: Diagnosis not present

## 2024-08-08 DIAGNOSIS — F1010 Alcohol abuse, uncomplicated: Secondary | ICD-10-CM | POA: Diagnosis present

## 2024-08-08 DIAGNOSIS — D539 Nutritional anemia, unspecified: Secondary | ICD-10-CM | POA: Diagnosis present

## 2024-08-08 DIAGNOSIS — K921 Melena: Secondary | ICD-10-CM | POA: Diagnosis not present

## 2024-08-08 DIAGNOSIS — K7011 Alcoholic hepatitis with ascites: Secondary | ICD-10-CM | POA: Diagnosis present

## 2024-08-08 DIAGNOSIS — K7030 Alcoholic cirrhosis of liver without ascites: Secondary | ICD-10-CM | POA: Insufficient documentation

## 2024-08-08 DIAGNOSIS — S0990XA Unspecified injury of head, initial encounter: Secondary | ICD-10-CM | POA: Diagnosis not present

## 2024-08-08 DIAGNOSIS — E538 Deficiency of other specified B group vitamins: Secondary | ICD-10-CM | POA: Diagnosis present

## 2024-08-08 DIAGNOSIS — D6959 Other secondary thrombocytopenia: Secondary | ICD-10-CM | POA: Diagnosis not present

## 2024-08-08 DIAGNOSIS — K766 Portal hypertension: Secondary | ICD-10-CM | POA: Diagnosis not present

## 2024-08-08 DIAGNOSIS — R270 Ataxia, unspecified: Secondary | ICD-10-CM | POA: Diagnosis not present

## 2024-08-08 DIAGNOSIS — B961 Klebsiella pneumoniae [K. pneumoniae] as the cause of diseases classified elsewhere: Secondary | ICD-10-CM | POA: Diagnosis present

## 2024-08-08 DIAGNOSIS — Z79890 Hormone replacement therapy (postmenopausal): Secondary | ICD-10-CM

## 2024-08-08 DIAGNOSIS — K8020 Calculus of gallbladder without cholecystitis without obstruction: Secondary | ICD-10-CM | POA: Diagnosis not present

## 2024-08-08 DIAGNOSIS — T8092XA Unspecified transfusion reaction, initial encounter: Secondary | ICD-10-CM

## 2024-08-08 DIAGNOSIS — J90 Pleural effusion, not elsewhere classified: Secondary | ICD-10-CM | POA: Diagnosis not present

## 2024-08-08 DIAGNOSIS — Z882 Allergy status to sulfonamides status: Secondary | ICD-10-CM

## 2024-08-08 DIAGNOSIS — Z79818 Long term (current) use of other agents affecting estrogen receptors and estrogen levels: Secondary | ICD-10-CM

## 2024-08-08 DIAGNOSIS — I8510 Secondary esophageal varices without bleeding: Secondary | ICD-10-CM | POA: Diagnosis present

## 2024-08-08 DIAGNOSIS — J9811 Atelectasis: Secondary | ICD-10-CM | POA: Diagnosis not present

## 2024-08-08 DIAGNOSIS — R0602 Shortness of breath: Secondary | ICD-10-CM | POA: Diagnosis not present

## 2024-08-08 DIAGNOSIS — R109 Unspecified abdominal pain: Secondary | ICD-10-CM | POA: Diagnosis not present

## 2024-08-08 DIAGNOSIS — D529 Folate deficiency anemia, unspecified: Secondary | ICD-10-CM | POA: Diagnosis not present

## 2024-08-08 DIAGNOSIS — Z7189 Other specified counseling: Secondary | ICD-10-CM | POA: Diagnosis not present

## 2024-08-08 DIAGNOSIS — Z515 Encounter for palliative care: Secondary | ICD-10-CM | POA: Diagnosis not present

## 2024-08-08 DIAGNOSIS — R7881 Bacteremia: Secondary | ICD-10-CM | POA: Diagnosis not present

## 2024-08-08 DIAGNOSIS — K8511 Biliary acute pancreatitis with uninfected necrosis: Secondary | ICD-10-CM | POA: Diagnosis not present

## 2024-08-08 DIAGNOSIS — E8770 Fluid overload, unspecified: Secondary | ICD-10-CM | POA: Diagnosis present

## 2024-08-08 DIAGNOSIS — H6690 Otitis media, unspecified, unspecified ear: Secondary | ICD-10-CM | POA: Diagnosis not present

## 2024-08-08 DIAGNOSIS — Z825 Family history of asthma and other chronic lower respiratory diseases: Secondary | ICD-10-CM

## 2024-08-08 HISTORY — DX: Von Willebrand disease, unspecified: D68.00

## 2024-08-08 HISTORY — DX: Hypothyroidism, unspecified: E03.9

## 2024-08-08 LAB — CBC WITH DIFFERENTIAL/PLATELET
Abs Immature Granulocytes: 0.1 K/uL — ABNORMAL HIGH (ref 0.00–0.07)
Basophils Absolute: 0 K/uL (ref 0.0–0.1)
Basophils Relative: 0 %
Eosinophils Absolute: 0 K/uL (ref 0.0–0.5)
Eosinophils Relative: 0 %
HCT: 32 % — ABNORMAL LOW (ref 36.0–46.0)
Hemoglobin: 11.5 g/dL — ABNORMAL LOW (ref 12.0–15.0)
Immature Granulocytes: 1 %
Lymphocytes Relative: 8 %
Lymphs Abs: 0.7 K/uL (ref 0.7–4.0)
MCH: 38 pg — ABNORMAL HIGH (ref 26.0–34.0)
MCHC: 35.9 g/dL (ref 30.0–36.0)
MCV: 105.6 fL — ABNORMAL HIGH (ref 80.0–100.0)
Monocytes Absolute: 1.1 K/uL — ABNORMAL HIGH (ref 0.1–1.0)
Monocytes Relative: 12 %
Neutro Abs: 7.8 K/uL — ABNORMAL HIGH (ref 1.7–7.7)
Neutrophils Relative %: 79 %
Platelets: 33 K/uL — ABNORMAL LOW (ref 150–400)
RBC: 3.03 MIL/uL — ABNORMAL LOW (ref 3.87–5.11)
RDW: 15.7 % — ABNORMAL HIGH (ref 11.5–15.5)
WBC: 9.8 K/uL (ref 4.0–10.5)
nRBC: 0 % (ref 0.0–0.2)

## 2024-08-08 LAB — BASIC METABOLIC PANEL WITH GFR
Anion gap: 14 (ref 5–15)
BUN: 6 mg/dL (ref 6–20)
CO2: 20 mmol/L — ABNORMAL LOW (ref 22–32)
Calcium: 7.3 mg/dL — ABNORMAL LOW (ref 8.9–10.3)
Chloride: 92 mmol/L — ABNORMAL LOW (ref 98–111)
Creatinine, Ser: 0.66 mg/dL (ref 0.44–1.00)
GFR, Estimated: >60 mL/min (ref >60)
Glucose, Bld: 123 mg/dL — ABNORMAL HIGH (ref 70–99)
Potassium: 3.6 mmol/L (ref 3.5–5.1)
Sodium: 126 mmol/L — ABNORMAL LOW (ref 135–145)

## 2024-08-08 LAB — COMPREHENSIVE METABOLIC PANEL WITH GFR
ALT: 77 U/L — ABNORMAL HIGH (ref 0–44)
AST: 353 U/L — ABNORMAL HIGH (ref 15–41)
Albumin: 2.7 g/dL — ABNORMAL LOW (ref 3.5–5.0)
Alkaline Phosphatase: 148 U/L — ABNORMAL HIGH (ref 38–126)
Anion gap: 22 — ABNORMAL HIGH (ref 5–15)
BUN: 7 mg/dL (ref 6–20)
CO2: 17 mmol/L — ABNORMAL LOW (ref 22–32)
Calcium: 8 mg/dL — ABNORMAL LOW (ref 8.9–10.3)
Chloride: 89 mmol/L — ABNORMAL LOW (ref 98–111)
Creatinine, Ser: 0.57 mg/dL (ref 0.44–1.00)
GFR, Estimated: >60 mL/min (ref >60)
Glucose, Bld: 88 mg/dL (ref 70–99)
Potassium: 3.9 mmol/L (ref 3.5–5.1)
Sodium: 128 mmol/L — ABNORMAL LOW (ref 135–145)
Total Bilirubin: 6.7 mg/dL — ABNORMAL HIGH (ref 0.0–1.2)
Total Protein: 7.6 g/dL (ref 6.5–8.1)

## 2024-08-08 LAB — HEPATIC FUNCTION PANEL
ALT: 64 U/L — ABNORMAL HIGH (ref 0–44)
AST: 299 U/L — ABNORMAL HIGH (ref 15–41)
Albumin: 1.8 g/dL — ABNORMAL LOW (ref 3.5–5.0)
Alkaline Phosphatase: 113 U/L (ref 38–126)
Bilirubin, Direct: 3.3 mg/dL — ABNORMAL HIGH (ref 0.0–0.2)
Indirect Bilirubin: 3.9 mg/dL — ABNORMAL HIGH (ref 0.3–0.9)
Total Bilirubin: 7.2 mg/dL — ABNORMAL HIGH (ref 0.0–1.2)
Total Protein: 7.3 g/dL (ref 6.5–8.1)

## 2024-08-08 LAB — PROTIME-INR
INR: 1.6 — ABNORMAL HIGH (ref 0.8–1.2)
Prothrombin Time: 20.1 s — ABNORMAL HIGH (ref 11.4–15.2)

## 2024-08-08 LAB — HEPATITIS PANEL, ACUTE
HCV Ab: NONREACTIVE
Hep A IgM: NONREACTIVE
Hep B C IgM: NONREACTIVE
Hepatitis B Surface Ag: NONREACTIVE

## 2024-08-08 LAB — LIPASE, BLOOD: Lipase: 57 U/L — ABNORMAL HIGH (ref 11–51)

## 2024-08-08 LAB — TYPE AND SCREEN
ABO/RH(D): O POS
Antibody Screen: NEGATIVE

## 2024-08-08 LAB — TSH: TSH: 3.397 u[IU]/mL (ref 0.350–4.500)

## 2024-08-08 LAB — CBC
HCT: 31 % — ABNORMAL LOW (ref 36.0–46.0)
Hemoglobin: 11.2 g/dL — ABNORMAL LOW (ref 12.0–15.0)
MCH: 38.4 pg — ABNORMAL HIGH (ref 26.0–34.0)
MCHC: 36.1 g/dL — ABNORMAL HIGH (ref 30.0–36.0)
MCV: 106.2 fL — ABNORMAL HIGH (ref 80.0–100.0)
Platelets: 27 K/uL — CL (ref 150–400)
RBC: 2.92 MIL/uL — ABNORMAL LOW (ref 3.87–5.11)
RDW: 15.8 % — ABNORMAL HIGH (ref 11.5–15.5)
WBC: 8.1 K/uL (ref 4.0–10.5)
nRBC: 0 % (ref 0.0–0.2)

## 2024-08-08 LAB — ABO/RH: ABO/RH(D): O POS

## 2024-08-08 LAB — PHOSPHORUS: Phosphorus: 1.9 mg/dL — ABNORMAL LOW (ref 2.5–4.6)

## 2024-08-08 LAB — LACTIC ACID, PLASMA: Lactic Acid, Venous: 3.1 mmol/L (ref 0.5–1.9)

## 2024-08-08 LAB — MAGNESIUM: Magnesium: 1.5 mg/dL — ABNORMAL LOW (ref 1.7–2.4)

## 2024-08-08 LAB — AMMONIA: Ammonia: 57 umol/L — ABNORMAL HIGH (ref 9–35)

## 2024-08-08 MED ORDER — ONDANSETRON HCL 4 MG/2ML IJ SOLN: 4.0000 mg | Freq: Four times a day (QID) | INTRAMUSCULAR | Status: DC | PRN

## 2024-08-08 MED ORDER — IOHEXOL 300 MG/ML  SOLN
100.0000 mL | Freq: Once | INTRAMUSCULAR | Status: AC | PRN
  Administered 2024-08-08: 100 mL via INTRAVENOUS

## 2024-08-08 MED ORDER — LACTATED RINGERS IV BOLUS
1000.0000 mL | Freq: Once | INTRAVENOUS | Status: AC
  Administered 2024-08-08: 1000 mL via INTRAVENOUS

## 2024-08-08 MED ORDER — FOLIC ACID 1 MG PO TABS
1.0000 mg | ORAL_TABLET | Freq: Every day | ORAL | Status: DC
  Administered 2024-08-09: 1 mg via ORAL
  Filled 2024-08-08 (×2): qty 1

## 2024-08-08 MED ORDER — BOOST / RESOURCE BREEZE PO LIQD CUSTOM
1.0000 | Freq: Three times a day (TID) | ORAL | Status: DC
  Administered 2024-08-09 – 2024-08-12 (×9): 1 via ORAL

## 2024-08-08 MED ORDER — SODIUM CHLORIDE 0.9% FLUSH
3.0000 mL | Freq: Two times a day (BID) | INTRAVENOUS | Status: DC
  Administered 2024-08-08 – 2024-08-14 (×13): 3 mL via INTRAVENOUS

## 2024-08-08 MED ORDER — LEVOTHYROXINE SODIUM 88 MCG PO TABS
88.0000 ug | ORAL_TABLET | Freq: Every day | ORAL | Status: DC
  Administered 2024-08-09 – 2024-08-15 (×7): 88 ug via ORAL
  Filled 2024-08-08 (×7): qty 1

## 2024-08-08 MED ORDER — SODIUM CHLORIDE 0.9 % IV SOLN
1.0000 g | INTRAVENOUS | Status: DC
  Administered 2024-08-08: 1 g via INTRAVENOUS
  Filled 2024-08-08: qty 10

## 2024-08-08 MED ORDER — DIAZEPAM 5 MG PO TABS
0.0000 mg | ORAL_TABLET | ORAL | Status: DC | PRN
  Filled 2024-08-08 (×2): qty 1

## 2024-08-08 MED ORDER — ADULT MULTIVITAMIN W/MINERALS CH
1.0000 | ORAL_TABLET | Freq: Every day | ORAL | Status: DC
  Administered 2024-08-09 – 2024-08-15 (×7): 1 via ORAL
  Filled 2024-08-08 (×8): qty 1

## 2024-08-08 MED ORDER — PANTOPRAZOLE SODIUM 40 MG IV SOLR
40.0000 mg | Freq: Two times a day (BID) | INTRAVENOUS | Status: DC
  Administered 2024-08-08 – 2024-08-13 (×11): 40 mg via INTRAVENOUS
  Filled 2024-08-08 (×12): qty 10

## 2024-08-08 MED ORDER — MELATONIN 3 MG PO TABS
6.0000 mg | ORAL_TABLET | Freq: Every evening | ORAL | Status: DC | PRN
  Filled 2024-08-08 (×2): qty 2

## 2024-08-08 MED ORDER — DIAZEPAM 5 MG/ML IJ SOLN: 0.0000 mg | INTRAMUSCULAR | Status: DC | PRN

## 2024-08-08 MED ORDER — ALBUTEROL SULFATE (2.5 MG/3ML) 0.083% IN NEBU: 2.5000 mg | INHALATION_SOLUTION | RESPIRATORY_TRACT | Status: DC | PRN

## 2024-08-08 MED ORDER — SODIUM CHLORIDE 0.9% IV SOLUTION: Freq: Once | INTRAVENOUS | Status: DC

## 2024-08-08 MED ORDER — THIAMINE HCL 100 MG/ML IJ SOLN
100.0000 mg | Freq: Every day | INTRAMUSCULAR | Status: DC
  Administered 2024-08-09: 100 mg via INTRAVENOUS
  Filled 2024-08-08 (×4): qty 2

## 2024-08-08 MED ORDER — THIAMINE MONONITRATE 100 MG PO TABS
100.0000 mg | ORAL_TABLET | Freq: Every day | ORAL | Status: DC
  Administered 2024-08-10 – 2024-08-15 (×6): 100 mg via ORAL
  Filled 2024-08-08 (×6): qty 1

## 2024-08-08 NOTE — H&P (Addendum)
 History and Physical    [name] FMW:[id number] DOB: [birth year] DOA: 08/08/2024  PCP: Patient, No Pcp Per   Patient coming from: [facility] ED    Chief Complaint:  Chief Complaint  Patient presents with   Ear Bleeding   Jaundice    HPI:  Rachel Lucas is a 63 y.o. female with hx of hypothyroidism, HLD, alcohol use, who is transferred from [facility] ED with new diagnosed cirrhosis and likely alcoholic hepatitis. 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   Patient reports she noted this morning that she had blood on her pillow.,  Without significant pain.  In the past few days she had noticed some ear fullness on the left side and tightness in her neck.  Denies any recent URI symptoms, fever.  No history of trauma or fall.  She does not recall using a Q-tip on the side although ED said that he had pulled out a remanent.  She continues to have slow bleeding from the left ear canal.  No history of antiplatelet or anticoagulant use.  Otherwise she has no known history of liver disease prior.  Does acknowledge heavy alcohol use about 3 to 4 glasses of wine on a regular basis.  Had not noticed any jaundice although about a week ago saw a chiropractor who commented that she appeared jaundiced.  Other than recent jaundice noted a few months of early satiety, decreased p.o. intake, fatigue, hypersomnolence.  Prior to departing from [facility] ED RN noted dark stool in commode.  Patient and family deny.  Since has been having brown stool denies any dark/black, bloody stools.  Has been having nausea but no vomiting recently.   Review of Systems:  ROS complete and negative except as marked above   Allergies  Allergen Reactions   Levofloxacin    Sulfonamide Derivatives     Prior to Admission medications   Medication Sig Start Date End Date Taking? Authorizing Provider  albuterol  (PROVENTIL  HFA;VENTOLIN  HFA) 108 (90 Base) MCG/ACT  inhaler Inhale 2 puffs into the lungs every 6 (six) hours as needed for wheezing or shortness of breath. 08/30/16   Alaine Vicenta NOVAK, MD  albuterol  (PROVENTIL ) (2.5 MG/3ML) 0.083% nebulizer solution Take 3 mLs (2.5 mg total) by nebulization every 6 (six) hours as needed for wheezing or shortness of breath. 08/30/16   Alaine Vicenta NOVAK, MD  azithromycin  (ZITHROMAX ) 250 MG tablet Take 2 today, then 1 daily until gone. 09/29/16   Alaine Vicenta NOVAK, MD  estradiol (VIVELLE-DOT) 0.05 MG/24HR patch Place 1 patch onto the skin 2 (two) times a week.    [provider]  levothyroxine  (SYNTHROID ) 88 MCG tablet Take 88 mcg by mouth daily before breakfast.    [provider]  progesterone (PROMETRIUM) 200 MG capsule Take 200 mg by mouth daily.    [provider]    Past Medical History:  Diagnosis Date   Thyroid  disease     No past surgical history on file.   reports that she quit smoking about 34 years ago. Her smoking use included cigarettes. She started smoking about 44 years ago. She has a 2 pack-year smoking history. She does not have any smokeless tobacco history on file. No history on file for alcohol use and drug use.  Family History  Problem Relation Age of Onset   Asthma Mother    Eczema Mother      Physical Exam: Vitals:  08/08/24 1730 08/08/24 1745 08/08/24 1800 08/08/24 1815  BP: 104/72 115/73 112/73 121/75  Pulse: (!) 108 (!) 107 (!) 110 (!) 108  Resp:      Temp:      TempSrc:      SpO2: 92% 92% 95% 96%    Gen: Awake, alert, chronically ill-appearing HEENT: Head is atraumatic. There is small amount of frank blood pooling in the left ear  CV: Regular, tachycardic, normal S1, S2, 1/6 SEM Resp: Normal WOB, minimal rales in the right base. Abd: Rounded slightly distended, normoactive, soft, nontender MSK: Symmetric, there is symmetric 1-2+ pitting edema from the mid shin down Skin: Jaundice, scattered ecchymosis on the arms. Neuro: Alert and  interactive, fully oriented, no asterixis. Psych: euthymic, appropriate    Data review:   Labs reviewed, notable for:   NA 128, bicarb 17, AG 22, creatinine 0.57 T. bili 6.7, not fractionated, AST 353, ALT 77, alk phos 148, WBC 9, hemoglobin hemoglobin 11, macrocytic, platelet 33. INR 1.6  Micro:  No results found for this or any previous visit.  Imaging reviewed:  CT ABDOMEN PELVIS W CONTRAST Result Date: 08/08/2024 CLINICAL DATA:  Acute abdominal pain.  Jaundice. EXAM: CT ABDOMEN AND PELVIS WITH CONTRAST TECHNIQUE: Multidetector CT imaging of the abdomen and pelvis was performed using the standard protocol following bolus administration of intravenous contrast. RADIATION DOSE REDUCTION: This exam was performed according to the departmental dose-optimization program which includes automated exposure control, adjustment of the mA and/or kV according to patient size and/or use of iterative reconstruction technique. CONTRAST:  100mL OMNIPAQUE  IOHEXOL  300 MG/ML  SOLN COMPARISON:  None Available. FINDINGS: Lower Chest: Tiny right pleural effusion and mild right lower lobe atelectasis. Hepatobiliary: Mild capsular nodularity of the liver is noted. Numerous punctate low-attenuation nodules are seen throughout the hepatic parenchyma. These findings are consistent with micronodular cirrhosis. No hypervascular hepatic masses are identified. Perihepatic ascites noted. Tiny calcified gallstone noted, without signs of cholecystitis or biliary ductal dilatation. No evidence of biliary ductal dilatation. Pancreas:  No mass or inflammatory changes. Spleen: Within normal limits in size and appearance. Adrenals/Urinary Tract: No suspicious masses identified. No evidence of ureteral calculi or hydronephrosis. Unremarkable unopacified urinary bladder. Stomach/Bowel: No evidence of obstruction, inflammatory process or abnormal fluid collections. Vascular/Lymphatic: No pathologically enlarged lymph nodes. No acute  vascular findings. Portosystemic venous collaterals are seen in the upper abdomen with gastroesophageal varices, consistent with portal venous hypertension. Reproductive:  No pelvic mass or inflammatory process identified. Other:  Moderate ascites and diffuse mesenteric edema seen. Musculoskeletal:  No suspicious bone lesions identified. IMPRESSION: Findings consistent with micronodular hepatic cirrhosis, and portal venous hypertension with gastroesophageal varices. No radiographic evidence of hepatocellular carcinoma. Moderate ascites and diffuse mesenteric edema. Cholelithiasis. No radiographic evidence of cholecystitis or biliary ductal dilatation. Tiny right pleural effusion and mild right lower lobe atelectasis. Electronically Signed   By: Norleen DELENA Kil M.D.   On: 08/08/2024 15:34   CT Temporal Bones Wo Contrast Result Date: 08/08/2024 EXAM: CT TEMPORAL BONES WITHOUT CONTRAST 08/08/2024 03:07:34 PM TECHNIQUE: CT of the temporal bones was performed without the administration of intravenous contrast. Multiplanar reformatted images are provided for review. Automated exposure control, iterative reconstruction, and/or weight based adjustment of the mA/kV was utilized to reduce the radiation dose to as low as reasonably achievable. COMPARISON: None available. CLINICAL HISTORY: Ataxia, head trauma; Persistent Bleeding from Ear, TM perforation. left ear bleeding this morning. No injuries to area., gen abd pain. Patient appears jaundice but not aware FINDINGS: RIGHT  TEMPORAL BONE: EXTERNAL AUDITORY CANAL: Clear. No bony erosion. Scutum is intact. MIDDLE EAR CAVITY: Clear. Ossicular chain is intact. MASTOID AIR CELLS: Clear. INNER EAR: The cochlea and vestibule are unremarkable. Normal mineralization of the otic capsule. Normal semicircular canals. The vestibular aqueduct is not dilated. INTERNAL AUDITORY CANAL: Unremarkable. Normal bony canal of the facial nerve. LEFT TEMPORAL BONE: EXTERNAL AUDITORY CANAL: Large  amount of external material at the opening of the left external auditory canal. The left external auditory canal is completely opacified. MIDDLE EAR CAVITY: There is partial opacification of the left middle ear cavity with sparing of the epitympanum. The tympanic membrane is not visible because of the abutting soft tissue density material. No ossicular erosion. MASTOID AIR CELLS: Clear. INNER EAR: The cochlea and vestibule are unremarkable. Normal mineralization of the otic capsule. Normal semicircular canals. The vestibular aqueduct is not dilated. INTERNAL AUDITORY CANAL: Unremarkable. Normal bony canal of the facial nerve. VASCULAR: Normal jugular bulbs. Normal carotid canals. BRAIN: Unremarkable. ORBITS: No acute abnormality. SINUSES: Clear. IMPRESSION: 1. Large amount of external material at the opening of the left external auditory canal and complete opacification of the canal, obscuring the tympanic membrane. 2. Partial opacification of the left middle ear cavity with sparing of the epitympanum. No ossicular erosion. Electronically signed by: Franky Stanford MD 08/08/2024 03:30 PM EDT RP Workstation: [id number]    EKG: Pending     ED Course:  On exam suctioned out remanant of Q tip on the L.    Assessment/Plan:  63 y.o. female with hx hypothyroidism, HLD, alcohol use, who is transferred from [facility] ED with new diagnosed cirrhosis and likely alcoholic hepatitis. 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  Decompensated cirrhosis, newly diagnosed Likely alcoholic hepatitis, severe  On initial presentation labs with T. bili 6.7, not fractionated, AST 353, ALT 77, alk phos 148. CT with findings of micronodular cirrhosis, moderate ascites, gastroesophageal varices. Associated coagulopathy, thrombocytopenia per below. Etiology of underlying cirrhosis unknown, suspect alcohol related possible other contributors. MELD-Na is 25, MDF is 44 --  GI consult, contacted [facility], Dr. Larita   - For alcoholic hepatitis, start NAC infusion; consideration for glucocorticoids.  -- Workup for new diagnosis of cirrhosis, check Hep B, C serology, defer additional autoimmune / inhereted eval to GI  -- HE: None, check ammonia  -- EV: Imaging suggestive of gastroesophageal varices; will need EGD at some point, consideration for bblockers  -- Ascites: Moderate ascites; IR order for Dx paracentesis  -- Strict alcohol abstinence counseled  Question upper GI bleed Prior to transfer noted to have dark stool in commode, patient and family denies any recent melana / hematochezia. Hb 11, currently hemodynamically stable. PLT 33. Not on antiPLT/ or AC. Does have signs of varices on imaging.  -- GI consult per above  -- Start on Pantoprazole  40 mg IV BID, at this point hold on octreotide gtt does not appear consistent with large bleed  -- CTX 1 g IV q24 hr for SBP ppx  -- Give 1 U of PLT, transfuse for Hb <7 or large volume bleed, or PLT < 20 unless active bleed < 50  -- Maintain 2 large bore IV  -- CLD, NPO except sip with meds after MN   Suspect TM rupture left Small amount of frank blood in the left canal and pooling in the ear. Per ED provider Q tip aspirated from the L ear canal. CT temporal bones  with large amount of blood obscuring TM, partial opacification of the middle ear with sparing of the epitypanum, no ossicular erosion.  -- If bleeding uncontrolled despite PLT transfusion, may need ENT involvement for intervention  - Has Fluoroquinolone allergy unable to use ofloxacin gtt; is on CTX for GI ppx which should provide coverage for OM   Thrombocytopenia Coagulopathy Related to underlying liver disease. - See plan transfusion above - Monitor CBC, INR  Anion gap metabolic acidosis -Check lactate  Hyponatremia Likely related to cirrhosis - Trend BMP  Alcohol use disorder Drinking 3-4 glasses of wine nightly on regular basis. No hx of  withdrawals   -- Counsled on strict alcohol abstinence  - CIWA with diazepam  as needed - Consider medication assisted therapy including acamprosate given her liver injury. - Thiamine , multivitamin, folate. - TOC for substance use resources  Chronic medical problems: Hypothyroidism: Check TSH. Continue home Levothyroxine   Hyperlipidemia: Noted    MELD 3.0: 25 at 08/08/2024  2:29 PM MELD-Na: 25 at 08/08/2024  2:29 PM Calculated from: Serum Creatinine: 0.57 mg/dL (Using min of 1 mg/dL) at 1/79/7974  8:74 PM Serum Sodium: 128 mmol/L at 08/08/2024  1:25 PM Total Bilirubin: 6.7 mg/dL at 1/79/7974  8:74 PM Serum Albumin: 2.7 g/dL at 1/79/7974  8:74 PM INR(ratio): 1.6 at 08/08/2024  2:29 PM Age at listing (hypothetical): 62 years Sex: Female at 08/08/2024  2:29 PM    There is no height or weight on file to calculate BMI.    DVT prophylaxis:  SCDs Code Status:  Full Code   Diet:  Diet Orders (From admission, onward)    None      Family Communication:  Yes discussed with husband, sister/ BIL at bedside    Consults:  [facility]  Admission status:   Inpatient, Step Down Unit  Severity of Illness: The appropriate patient status for this patient is INPATIENT. Inpatient status is judged to be reasonable and necessary in order to provide the required intensity of service to ensure the patient's safety. The patient's presenting symptoms, physical exam findings, and initial radiographic and laboratory data in the context of their chronic comorbidities is felt to place them at high risk for further clinical deterioration. Furthermore, it is not anticipated that the patient will be medically stable for discharge from the hospital within 2 midnights of admission.   * I certify that at the point of admission it is my clinical judgment that the patient will require inpatient hospital care spanning beyond 2 midnights from the point of admission due to high intensity of service, high risk for further  deterioration and high frequency of surveillance required.*   Dorn Dawson, MD [facility]  How to contact the [facility] Attending or Consulting provider 7A - 7P or covering provider during after hours 7P -7A, for this patient.  Check the care team in [facility] and look for a) attending/consulting [facility] provider listed and b) the [facility] team listed Log into [email or web address] and use [place]'s universal password to access. If you do not have the password, please contact the hospital operator. Locate the [facility] provider you are looking for under [facility] and page to a number that you can be directly reached. If you still have difficulty reaching the provider, please page the [facility] (Director on Call) for the Hospitalists listed on amion for assistance.  08/08/2024, 7:56 PM

## 2024-08-08 NOTE — ED Notes (Signed)
 Called [facility] to transport the patient to [facility] rm# [id number]

## 2024-08-08 NOTE — ED Notes (Addendum)
 Patient unsteady walking to the bathroom. Assist needed.   After using the toilet RN noted dark tar colored stool in toilet.   RN attempted to call report to 2 Chad at [facility] and was asked to give a call back number, receiving RN unavailable at this time. Secretary was made aware [facility] had left with patient already.

## 2024-08-08 NOTE — ED Triage Notes (Signed)
 Reports left ear bleeding this morning. No injuries to area.  Patient appears jaundice but not aware.

## 2024-08-08 NOTE — ED Provider Notes (Addendum)
 [place] EMERGENCY DEPARTMENT AT [facility] Provider Note   CSN: [id number] Arrival date & time: 08/08/24  1225     Patient presents with: Ear Bleeding and Jaundice   Rachel Lucas is a 63 y.o. female.   63 year old female with past medical history of hypothyroidism presenting to the emergency department today with bleeding from her left ear.  The patient states she was having some pain there 2 days ago.  Reports that she did try using some Q-tips because she was having some ear fullness.  She states that she woke up this morning and had some spontaneous bleeding from her ear.  She came to the ER for further evaluation regarding this.  She was noted to be jaundiced and have scleral icterus when she arrived here.  The patient states that she does drink 1.5 glasses of wine daily.  She came to the emergency department for further evaluation regarding this.  She denies any abdominal pain but has had decreased appetite now over the past month or 2.        Prior to Admission medications   Medication Sig Start Date End Date Taking? Authorizing Provider  albuterol  (PROVENTIL  HFA;VENTOLIN  HFA) 108 (90 Base) MCG/ACT inhaler Inhale 2 puffs into the lungs every 6 (six) hours as needed for wheezing or shortness of breath. 08/30/16   McQuaid, Douglas B, MD  albuterol  (PROVENTIL ) (2.5 MG/3ML) 0.083% nebulizer solution Take 3 mLs (2.5 mg total) by nebulization every 6 (six) hours as needed for wheezing or shortness of breath. 08/30/16   Alaine Vicenta NOVAK, MD  azithromycin  (ZITHROMAX ) 250 MG tablet Take 2 today, then 1 daily until gone. 09/29/16   Alaine Vicenta NOVAK, MD  estradiol (VIVELLE-DOT) 0.05 MG/24HR patch Place 1 patch onto the skin 2 (two) times a week.    [provider]  levothyroxine  (SYNTHROID ) 88 MCG tablet Take 88 mcg by mouth daily before breakfast.    [provider]  progesterone (PROMETRIUM) 200 MG capsule Take 200 mg by mouth daily.    [provider]    Allergies: Levofloxacin and Sulfonamide derivatives    Review of Systems  Hematological:  Bruises/bleeds easily.  All other systems reviewed and are negative.   Updated Vital Signs BP 116/75   Pulse (!) 108   Temp 98 F (36.7 C) (Oral)   Resp 19   SpO2 98%   Physical Exam Vitals and nursing note reviewed.   Gen: NAD Eyes: PERRL, EOMI, scleral icterus HEENT: no oropharyngeal swelling Neck: trachea midline Resp: clear to auscultation bilaterally Card: Tachycardic, no murmurs, rubs, or gallops Abd: nontender, moderately distended Extremities: no calf tenderness, no edema Vascular: 2+ radial pulses bilaterally, 2+ DP pulses bilaterally Skin: no rashes, jaundiced Psyc: acting appropriately   (all labs ordered are listed, but only abnormal results are displayed) Labs Reviewed  CBC WITH DIFFERENTIAL/PLATELET - Abnormal; Notable for the following components:      Result Value   RBC 3.03 (*)    Hemoglobin 11.5 (*)    HCT 32.0 (*)    MCV 105.6 (*)    MCH 38.0 (*)    RDW 15.7 (*)    Platelets 33 (*)    Neutro Abs 7.8 (*)    Monocytes Absolute 1.1 (*)    Abs Immature Granulocytes 0.10 (*)    All other components within normal limits  COMPREHENSIVE METABOLIC PANEL WITH GFR - Abnormal; Notable for the following components:   Sodium 128 (*)    Chloride 89 (*)  CO2 17 (*)    Calcium 8.0 (*)    Albumin 2.7 (*)    AST 353 (*)    ALT 77 (*)    Alkaline Phosphatase 148 (*)    Total Bilirubin 6.7 (*)    Anion gap 22 (*)    All other components within normal limits  LIPASE, BLOOD - Abnormal; Notable for the following components:   Lipase 57 (*)    All other components within normal limits  PROTIME-INR    EKG: None  Radiology: No results found.   Procedures   Medications Ordered in the ED  iohexol  (OMNIPAQUE ) 300 MG/ML solution 100 mL (has no administration in time range)    Clinical Course as of 08/08/24 1559  Wed Aug 08, 2024  1548  Assumed care from Dr. Ula.  63 year old female presents to the emergency department with bleeding from her ear.  Found to be in liver failure.  Also some cytopenic.  Suspect alcohol use but says that she only drinks 1.5 glasses of wine a day. [RP]    Clinical Course User Index [RP] Yolande Lamar BROCKS, MD                                 Medical Decision Making 63 year old female with past medical history of hypothyroidism presenting to the emergency department today with bleeding from her left ear.  This does appear to be a possible TM rupture on the left.  Was able to suction out what appears to be remnants of a Q-tip.  This does continue to bleed.  Will further evaluate the patient here with basic labs including LFTs and an INR.  Will also obtain a CT scan of her temporal bones as well as CT abdomen for further evaluation for obstructive jaundice.  I will reevaluate but the patient does appear to potentially have decompensated liver failure with coagulopathy so she may require admission.  The patient does have some mild hyponatremia and LFTs and bilirubin are elevated.  Her platelet count is low at 33,000 consistent with acute liver failure.  CT imaging is pending at time of signout.  Plan is for likely admission as the patient is having the spontaneous bleeding in light of decompensated liver failure and thrombocytopenia.  Imaging studies do show cirrhosis.  CT temporal bones is essentially nondiagnostic due to the blood in the ear canal.  Given her presenting with decompensated liver failure calls placed to the hospitalist service for admission.  Amount and/or Complexity of Data Reviewed Labs: ordered. Radiology: ordered.  Risk Prescription drug management. Decision regarding hospitalization.        Final diagnoses:  Liver failure without hepatic coma, unspecified chronicity (HCC)  Thrombocytopenia [facility])    ED Discharge Orders     None          Ula Prentice SAUNDERS, MD 08/08/24  1445    Ula Prentice SAUNDERS, MD 08/08/24 [phone]

## 2024-08-08 NOTE — ED Notes (Signed)
 Blue top saved in lab

## 2024-08-08 NOTE — ED Provider Notes (Signed)
  Physical Exam  BP 108/73   Pulse (!) 103   Temp 98 F (36.7 C) (Oral)   Resp 19   SpO2 95%   Physical Exam  Procedures  Procedures  ED Course / MDM   Clinical Course as of 08/08/24 1630  Wed Aug 08, 2024  1548 Assumed care from Dr. Ula.  63 year old female presents to the emergency department with bleeding from her ear.  Found to be in liver failure.  Also some cytopenic.  Suspect alcohol use but says that she only drinks 1.5 glasses of wine a day. [RP]  1628 Dr Donavon from hospitalist consulted for admission.  [RP]    Clinical Course User Index [RP] Rachel Lamar BROCKS, MD   Medical Decision Making Amount and/or Complexity of Data Reviewed Labs: ordered. Radiology: ordered.  Risk Prescription drug management. Decision regarding hospitalization.      Rachel Lamar BROCKS, MD 08/08/24 1630

## 2024-08-09 ENCOUNTER — Inpatient Hospital Stay (HOSPITAL_COMMUNITY)

## 2024-08-09 DIAGNOSIS — R7881 Bacteremia: Secondary | ICD-10-CM

## 2024-08-09 DIAGNOSIS — E871 Hypo-osmolality and hyponatremia: Secondary | ICD-10-CM

## 2024-08-09 DIAGNOSIS — D529 Folate deficiency anemia, unspecified: Secondary | ICD-10-CM

## 2024-08-09 DIAGNOSIS — H6690 Otitis media, unspecified, unspecified ear: Secondary | ICD-10-CM

## 2024-08-09 DIAGNOSIS — D689 Coagulation defect, unspecified: Secondary | ICD-10-CM | POA: Diagnosis not present

## 2024-08-09 DIAGNOSIS — K7010 Alcoholic hepatitis without ascites: Secondary | ICD-10-CM

## 2024-08-09 DIAGNOSIS — D696 Thrombocytopenia, unspecified: Secondary | ICD-10-CM

## 2024-08-09 DIAGNOSIS — H9222 Otorrhagia, left ear: Secondary | ICD-10-CM | POA: Diagnosis not present

## 2024-08-09 DIAGNOSIS — I8500 Esophageal varices without bleeding: Secondary | ICD-10-CM | POA: Diagnosis not present

## 2024-08-09 DIAGNOSIS — B9689 Other specified bacterial agents as the cause of diseases classified elsewhere: Secondary | ICD-10-CM

## 2024-08-09 DIAGNOSIS — E039 Hypothyroidism, unspecified: Secondary | ICD-10-CM | POA: Diagnosis not present

## 2024-08-09 DIAGNOSIS — R188 Other ascites: Secondary | ICD-10-CM

## 2024-08-09 DIAGNOSIS — K7460 Unspecified cirrhosis of liver: Secondary | ICD-10-CM | POA: Diagnosis not present

## 2024-08-09 DIAGNOSIS — T8092XA Unspecified transfusion reaction, initial encounter: Secondary | ICD-10-CM

## 2024-08-09 LAB — BLOOD CULTURE ID PANEL (REFLEXED) - BCID2

## 2024-08-09 LAB — BASIC METABOLIC PANEL WITH GFR
Anion gap: 12 (ref 5–15)
BUN: <5 mg/dL — ABNORMAL LOW (ref 6–20)
CO2: 21 mmol/L — ABNORMAL LOW (ref 22–32)
Calcium: 7.2 mg/dL — ABNORMAL LOW (ref 8.9–10.3)
Chloride: 93 mmol/L — ABNORMAL LOW (ref 98–111)
Creatinine, Ser: 0.76 mg/dL (ref 0.44–1.00)
GFR, Estimated: >60 mL/min (ref >60)
Glucose, Bld: 98 mg/dL (ref 70–99)
Potassium: 3.1 mmol/L — ABNORMAL LOW (ref 3.5–5.1)
Sodium: 126 mmol/L — ABNORMAL LOW (ref 135–145)

## 2024-08-09 LAB — URINALYSIS, COMPLETE (UACMP) WITH MICROSCOPIC
Glucose, UA: NEGATIVE mg/dL
Ketones, ur: 40 mg/dL — AB
Nitrite: NEGATIVE
Protein, ur: 100 mg/dL — AB
Specific Gravity, Urine: 1.015 (ref 1.005–1.030)
WBC, UA: >50 WBC/hpf (ref 0–5)
pH: 6.5 (ref 5.0–8.0)

## 2024-08-09 LAB — HEPATIC FUNCTION PANEL
ALT: 56 U/L — ABNORMAL HIGH (ref 0–44)
AST: 255 U/L — ABNORMAL HIGH (ref 15–41)
Albumin: 1.7 g/dL — ABNORMAL LOW (ref 3.5–5.0)
Alkaline Phosphatase: 89 U/L (ref 38–126)
Bilirubin, Direct: 2.8 mg/dL — ABNORMAL HIGH (ref 0.0–0.2)
Indirect Bilirubin: 3.1 mg/dL — ABNORMAL HIGH (ref 0.3–0.9)
Total Bilirubin: 5.9 mg/dL — ABNORMAL HIGH (ref 0.0–1.2)
Total Protein: 6.6 g/dL (ref 6.5–8.1)

## 2024-08-09 LAB — IRON AND TIBC: Iron: 42 ug/dL (ref 28–170)

## 2024-08-09 LAB — BPAM PLATELET PHERESIS
Blood Product Expiration Date: 202508232359
ISSUE DATE / TIME: 202508210326
Unit Type and Rh: 6200

## 2024-08-09 LAB — PROTIME-INR
INR: 1.7 — ABNORMAL HIGH (ref 0.8–1.2)
Prothrombin Time: 21 s — ABNORMAL HIGH (ref 11.4–15.2)

## 2024-08-09 LAB — PHOSPHORUS: Phosphorus: 1.8 mg/dL — ABNORMAL LOW (ref 2.5–4.6)

## 2024-08-09 LAB — CBC
HCT: 27.9 % — ABNORMAL LOW (ref 36.0–46.0)
Hemoglobin: 10.1 g/dL — ABNORMAL LOW (ref 12.0–15.0)
MCH: 38.1 pg — ABNORMAL HIGH (ref 26.0–34.0)
MCHC: 36.2 g/dL — ABNORMAL HIGH (ref 30.0–36.0)
MCV: 105.3 fL — ABNORMAL HIGH (ref 80.0–100.0)
Platelets: 43 K/uL — ABNORMAL LOW (ref 150–400)
RBC: 2.65 MIL/uL — ABNORMAL LOW (ref 3.87–5.11)
RDW: 15.9 % — ABNORMAL HIGH (ref 11.5–15.5)
WBC: 7.5 K/uL (ref 4.0–10.5)
nRBC: 0.3 % — ABNORMAL HIGH (ref 0.0–0.2)

## 2024-08-09 LAB — LACTATE DEHYDROGENASE: LDH: 354 U/L — ABNORMAL HIGH (ref 98–192)

## 2024-08-09 LAB — PREPARE PLATELET PHERESIS: Unit division: 0

## 2024-08-09 LAB — HEPATITIS C ANTIBODY: HCV Ab: NONREACTIVE

## 2024-08-09 LAB — VITAMIN B12: Vitamin B-12: 1051 pg/mL — ABNORMAL HIGH (ref 180–914)

## 2024-08-09 LAB — LACTIC ACID, PLASMA
Lactic Acid, Venous: 3.2 mmol/L (ref 0.5–1.9)
Lactic Acid, Venous: 2.5 mmol/L (ref 0.5–1.9)

## 2024-08-09 LAB — FOLATE: Folate: 3.2 ng/mL — ABNORMAL LOW (ref >5.9)

## 2024-08-09 LAB — HIV ANTIBODY (ROUTINE TESTING W REFLEX): HIV Screen 4th Generation wRfx: NONREACTIVE

## 2024-08-09 LAB — FERRITIN: Ferritin: 4931 ng/mL — ABNORMAL HIGH (ref 11–307)

## 2024-08-09 LAB — HEPATITIS B SURFACE ANTIGEN: Hepatitis B Surface Ag: NONREACTIVE

## 2024-08-09 LAB — HEPATITIS B SURFACE ANTIBODY,QUALITATIVE: Hep B S Ab: NONREACTIVE

## 2024-08-09 LAB — MAGNESIUM: Magnesium: 1.3 mg/dL — ABNORMAL LOW (ref 1.7–2.4)

## 2024-08-09 MED ORDER — OXYMETAZOLINE HCL 0.05 % NA SOLN
1.0000 | Freq: Once | NASAL | Status: AC
  Administered 2024-08-09: 1 via NASAL
  Filled 2024-08-09: qty 30

## 2024-08-09 MED ORDER — LACTATED RINGERS IV BOLUS
500.0000 mL | Freq: Once | INTRAVENOUS | Status: AC
  Administered 2024-08-09: 500 mL via INTRAVENOUS

## 2024-08-09 MED ORDER — SODIUM CHLORIDE 0.9% IV SOLUTION: Freq: Once | INTRAVENOUS | Status: DC

## 2024-08-09 MED ORDER — ACETAMINOPHEN 500 MG PO TABS
500.0000 mg | ORAL_TABLET | Freq: Once | ORAL | Status: DC
  Filled 2024-08-09: qty 1

## 2024-08-09 MED ORDER — MAGNESIUM SULFATE 4 GM/100ML IV SOLN
4.0000 g | Freq: Once | INTRAVENOUS | Status: AC
  Administered 2024-08-09: 4 g via INTRAVENOUS
  Filled 2024-08-09: qty 100

## 2024-08-09 MED ORDER — POTASSIUM PHOSPHATES 15 MMOLE/5ML IV SOLN
30.0000 mmol | Freq: Once | INTRAVENOUS | Status: AC
  Administered 2024-08-09: 30 mmol via INTRAVENOUS
  Filled 2024-08-09: qty 10

## 2024-08-09 MED ORDER — POTASSIUM CHLORIDE CRYS ER 20 MEQ PO TBCR
40.0000 meq | EXTENDED_RELEASE_TABLET | Freq: Once | ORAL | Status: AC
  Administered 2024-08-09: 40 meq via ORAL
  Filled 2024-08-09: qty 2

## 2024-08-09 MED ORDER — FOLIC ACID 1 MG PO TABS
2.0000 mg | ORAL_TABLET | Freq: Every day | ORAL | Status: DC
  Administered 2024-08-10 – 2024-08-15 (×6): 2 mg via ORAL
  Filled 2024-08-09 (×6): qty 2

## 2024-08-09 MED ORDER — LACTATED RINGERS IV BOLUS: 500.0000 mL | Freq: Once | INTRAVENOUS | Status: DC

## 2024-08-09 MED ORDER — LIDOCAINE-EPINEPHRINE 1 %-1:100000 IJ SOLN
INTRAMUSCULAR | Status: AC
  Filled 2024-08-09: qty 1

## 2024-08-09 MED ORDER — IBUPROFEN 400 MG PO TABS: 200.0000 mg | ORAL_TABLET | Freq: Once | ORAL | Status: DC

## 2024-08-09 MED ORDER — LACTULOSE 10 GM/15ML PO SOLN
10.0000 g | Freq: Two times a day (BID) | ORAL | Status: DC
  Administered 2024-08-10 – 2024-08-15 (×8): 10 g via ORAL
  Filled 2024-08-09 (×9): qty 30

## 2024-08-09 MED ORDER — FUROSEMIDE 40 MG PO TABS
40.0000 mg | ORAL_TABLET | Freq: Every day | ORAL | Status: DC
  Administered 2024-08-09 – 2024-08-11 (×3): 40 mg via ORAL
  Filled 2024-08-09 (×3): qty 1

## 2024-08-09 MED ORDER — SODIUM CHLORIDE 0.9 % IV SOLN
2.0000 g | INTRAVENOUS | Status: DC
  Administered 2024-08-09 – 2024-08-14 (×6): 2 g via INTRAVENOUS
  Filled 2024-08-09 (×6): qty 20

## 2024-08-09 MED ORDER — PHYTONADIONE 5 MG PO TABS
10.0000 mg | ORAL_TABLET | Freq: Every day | ORAL | Status: AC
  Administered 2024-08-09 – 2024-08-11 (×3): 10 mg via ORAL
  Filled 2024-08-09 (×3): qty 2

## 2024-08-09 MED ORDER — ACETAMINOPHEN 325 MG PO TABS: 650.0000 mg | ORAL_TABLET | Freq: Four times a day (QID) | ORAL | Status: DC | PRN

## 2024-08-09 NOTE — Plan of Care (Signed)
  Problem: Clinical Measurements: Goal: Ability to maintain clinical measurements within normal limits will improve Outcome: Progressing Goal: Will remain free from infection Outcome: Progressing Goal: Diagnostic test results will improve Outcome: Progressing Goal: Respiratory complications will improve Outcome: Progressing   Problem: Activity: Goal: Risk for activity intolerance will decrease Outcome: Progressing   Problem: Nutrition: Goal: Adequate nutrition will be maintained Outcome: Not Progressing

## 2024-08-09 NOTE — Plan of Care (Addendum)
  Patient continues to have left-sided ear bleeding.  Newly diagnosis of decompensated hepatic cirrhosis that causing platelet dysfunction and causing ER bleeding. - Patient receiving total 2 units of platelet transfusion and will give 1 unit of FFP. - Discussed with ENT Dr. Tobie recommended apply 5 to 10 of drops of Afrin soak with a cottonball and apply to the left ear.  ENT will evaluate patient in the morning.   Demetrios Byron, MD [facility] 08/09/2024, 1:31 AM

## 2024-08-09 NOTE — Progress Notes (Signed)
 0057 Pt temp and HR elevated. Mews yellow. Patient continues to have bleeding for her ear. Provider notified. Order for to be placed on cotton swab and placed in ear. ENT will see in the morning.   315 Patient temp remains elevated. Mews now red. Provider, charge nurse and rapid response nurse notified. Order for tylenol  placed. Patient refused. Order for ibuprofen  placed. Patient refuses. Pt agreeable to placing a few ice pack on her to attempt to control fever.   0330 Provider questions if transfusion reaction. Platelets already completed. Protocol initiated. Blood bank notified. Provider states once temp is within normal limits ok to give fresh frozen plasma. Provider also order LR bolus.

## 2024-08-09 NOTE — Plan of Care (Signed)
 Patient arrived to unit A&O X4, family at bedside. Patient bleeding moderately from left and stated that she ruptured her ear drum by using a Q-tip. Scattered bruising bilateral upper extremities, bruising on left flank and anterior right lower leg. Patient abdomin distended. Patient educated on care plan, lab results, and medications. Patient refused all medications except IV antibiotic. Patient and husband re educated on care plan, diagnosis and relation to medication. Patient moved to [address] [place].   Problem: Education: Goal: Knowledge of General Education information will improve Description: Including pain rating scale, medication(s)/side effects and non-pharmacologic comfort measures Outcome: Progressing   Problem: Clinical Measurements: Goal: Ability to maintain clinical measurements within normal limits will improve Outcome: Progressing Goal: Will remain free from infection Outcome: Progressing Goal: Diagnostic test results will improve Outcome: Progressing   Problem: Coping: Goal: Level of anxiety will decrease Outcome: Progressing   Problem: Safety: Goal: Ability to remain free from injury will improve Outcome: Progressing

## 2024-08-09 NOTE — Consult Note (Signed)
 ENT CONSULT:  Reason for Consult: Left ear bleeding  Referring Physician:  Dr. Lee  HPI: Rachel Lucas is an 63 y.o. female currently admitted for Decompensated cirrhosis who ENT was consulted for left ear bleeding. Patient reports that she was in her usual state of health otologically when she used a qtip and reports scratching her left ear with subsequent bleeding. Did not resolve, and thus came to ED. No intervention from bleeding standpoint. Currently reports left ear fullness but denies: Patient denies: ear pain, vertigo, purulent drainage, tinnitus Patient additionally denies: deep pain in ear canal Patient also denies barotrauma, vestibular suppressant use, ototoxic medication use Prior ear surgery: denies Denies ear problems in the past.  Is thrombocytopenic. CT Temporal bones was also done which is interpreted below.    Past Medical History:  Diagnosis Date   Hypothyroidism    Thyroid  disease    Von Willebrand disease (HCC)     Past Surgical History:  Procedure Laterality Date   CYST REMOVAL NECK  1995    Family History  Problem Relation Age of Onset   Asthma Mother    Eczema Mother     Social History:  reports that she quit smoking about 34 years ago. Her smoking use included cigarettes. She started smoking about 44 years ago. She has a 2 pack-year smoking history. She does not have any smokeless tobacco history on file. She reports current alcohol use of about 8.0 standard drinks of alcohol per week. She reports that she does not use drugs.  Allergies:  Allergies  Allergen Reactions   Levofloxacin Other (See Comments)   Sulfonamide Derivatives Palpitations    Medications: I have reviewed the patient's current medications.  Results for orders placed or performed during the hospital encounter of 08/08/24 (from the past 48 hours)  CBC with Differential     Status: Abnormal   Collection Time: 08/08/24  1:25 PM  Result Value Ref Range   WBC 9.8 4.0 -  10.5 K/uL   RBC 3.03 (L) 3.87 - 5.11 MIL/uL   Hemoglobin 11.5 (L) 12.0 - 15.0 g/dL   HCT 67.9 (L) 63.9 - 53.9 %   MCV 105.6 (H) 80.0 - 100.0 fL   MCH 38.0 (H) 26.0 - 34.0 pg   MCHC 35.9 30.0 - 36.0 g/dL   RDW 84.2 (H) 88.4 - 84.4 %   Platelets 33 (L) 150 - 400 K/uL    Comment: SPECIMEN CHECKED FOR CLOTS PLATELET COUNT CONFIRMED BY SMEAR REPEATED TO VERIFY Immature Platelet Fraction may be clinically indicated, consider ordering this additional test [id number]    nRBC 0.0 0.0 - 0.2 %   Neutrophils Relative % 79 %   Neutro Abs 7.8 (H) 1.7 - 7.7 K/uL   Lymphocytes Relative 8 %   Lymphs Abs 0.7 0.7 - 4.0 K/uL   Monocytes Relative 12 %   Monocytes Absolute 1.1 (H) 0.1 - 1.0 K/uL   Eosinophils Relative 0 %   Eosinophils Absolute 0.0 0.0 - 0.5 K/uL   Basophils Relative 0 %   Basophils Absolute 0.0 0.0 - 0.1 K/uL   Immature Granulocytes 1 %   Abs Immature Granulocytes 0.10 (H) 0.00 - 0.07 K/uL    Comment: Performed at [facility], [address] [place], [place], [place] [address]  Comprehensive metabolic panel     Status: Abnormal   Collection Time: 08/08/24  1:25 PM  Result Value Ref Range   Sodium 128 (L) 135 - 145 mmol/L    Comment: Electrolytes repeated to  confirm.   Potassium 3.9 3.5 - 5.1 mmol/L   Chloride 89 (L) 98 - 111 mmol/L   CO2 17 (L) 22 - 32 mmol/L   Glucose, Bld 88 70 - 99 mg/dL    Comment: Glucose reference range applies only to samples taken after fasting for at least 8 hours.   BUN 7 6 - 20 mg/dL   Creatinine, Ser 9.42 0.44 - 1.00 mg/dL    Comment: ICTERUS AT THIS LEVEL MAY AFFECT RESULT   Calcium 8.0 (L) 8.9 - 10.3 mg/dL   Total Protein 7.6 6.5 - 8.1 g/dL   Albumin 2.7 (L) 3.5 - 5.0 g/dL   AST 646 (H) 15 - 41 U/L   ALT 77 (H) 0 - 44 U/L   Alkaline Phosphatase 148 (H) 38 - 126 U/L   Total Bilirubin 6.7 (H) 0.0 - 1.2 mg/dL   GFR, Estimated >39 >39 mL/min    Comment: (NOTE) Calculated using the CKD-EPI Creatinine Equation (2021)    Anion gap  22 (H) 5 - 15    Comment: Performed at [facility], [address] [place], [place] [address]  Lipase, blood     Status: Abnormal   Collection Time: 08/08/24  1:25 PM  Result Value Ref Range   Lipase 57 (H) 11 - 51 U/L    Comment: Performed at [facility], [address] [place], [place] [address]  Protime-INR     Status: Abnormal   Collection Time: 08/08/24  2:29 PM  Result Value Ref Range   Prothrombin Time 20.1 (H) 11.4 - 15.2 seconds   INR 1.6 (H) 0.8 - 1.2    Comment: (NOTE) INR goal varies based on device and disease states. Performed at [facility], [address], [place], [place] [address]   Hepatitis panel, acute     Status: None   Collection Time: 08/08/24  4:02 PM  Result Value Ref Range   Hepatitis B Surface Ag NON REACTIVE NON REACTIVE   HCV Ab NON REACTIVE NON REACTIVE    Comment: (NOTE) Nonreactive HCV antibody screen is consistent with no HCV infections,  unless recent infection is suspected or other evidence exists to indicate HCV infection.     Hep A IgM NON REACTIVE NON REACTIVE   Hep B C IgM NON REACTIVE NON REACTIVE    Comment: Performed at [facility], [address] [place], [place] [address]  Prepare platelet pheresis     Status: None (Preliminary result)   Collection Time: 08/08/24  8:17 PM  Result Value Ref Range   Unit Number [id number]    Blood Component Type PLTP2 PSORALEN TREATED    Unit division 00    Status of Unit ISSUED    Transfusion Status OK TO TRANSFUSE   Type and screen     Status: None   Collection Time: 08/08/24  9:15 PM  Result Value Ref Range   ABO/RH(D) O POS    Antibody Screen NEG    Sample Expiration      08/11/2024,2359 Performed at [facility], [address] [place], [place] [address]   Basic metabolic panel with GFR     Status: Abnormal   Collection Time: 08/08/24  9:18 PM  Result Value Ref Range   Sodium 126 (L) 135 - 145 mmol/L    Potassium 3.6 3.5 - 5.1 mmol/L   Chloride 92 (L) 98 - 111 mmol/L   CO2 20 (L) 22 - 32 mmol/L   Glucose, Bld  123 (H) 70 - 99 mg/dL    Comment: Glucose reference range applies only to samples taken after fasting for at least 8 hours.   BUN 6 6 - 20 mg/dL   Creatinine, Ser 9.33 0.44 - 1.00 mg/dL   Calcium 7.3 (L) 8.9 - 10.3 mg/dL   GFR, Estimated >39 >39 mL/min    Comment: (NOTE) Calculated using the CKD-EPI Creatinine Equation (2021)    Anion gap 14 5 - 15    Comment: Performed at [facility], [address], [place], [place] [address]  Lactic acid, plasma     Status: Abnormal   Collection Time: 08/08/24  9:18 PM  Result Value Ref Range   Lactic Acid, Venous 3.1 (HH) 0.5 - 1.9 mmol/L    Comment: CRITICAL RESULT CALLED TO, READ BACK BY AND VERIFIED WITH LOIS PIETY RN [phone] M. [facility] Performed at [facility], [address] [place], [place] [address]   Hepatic function panel     Status: Abnormal   Collection Time: 08/08/24  9:18 PM  Result Value Ref Range   Total Protein 7.3 6.5 - 8.1 g/dL   Albumin 1.8 (L) 3.5 - 5.0 g/dL   AST 700 (H) 15 - 41 U/L   ALT 64 (H) 0 - 44 U/L   Alkaline Phosphatase 113 38 - 126 U/L   Total Bilirubin 7.2 (H) 0.0 - 1.2 mg/dL   Bilirubin, Direct 3.3 (H) 0.0 - 0.2 mg/dL   Indirect Bilirubin 3.9 (H) 0.3 - 0.9 mg/dL    Comment: Performed at [facility], [address] [place], [place] [address]  Magnesium      Status: Abnormal   Collection Time: 08/08/24  9:18 PM  Result Value Ref Range   Magnesium  1.5 (L) 1.7 - 2.4 mg/dL    Comment: Performed at [facility], [address], [place], [place] [address]  Phosphorus     Status: Abnormal   Collection Time: 08/08/24  9:18 PM  Result Value Ref Range   Phosphorus 1.9 (L) 2.5 - 4.6 mg/dL    Comment: Performed at [facility], [address] [place], [place] [address]  TSH     Status: None   Collection Time: 08/08/24  9:18 PM  Result Value Ref Range   TSH 3.397 0.350  - 4.500 uIU/mL    Comment: Performed by a 3rd Generation assay with a functional sensitivity of <=0.01 uIU/mL. Performed at [facility], [address], [place], [place] [address]   CBC     Status: Abnormal   Collection Time: 08/08/24  9:18 PM  Result Value Ref Range   WBC 8.1 4.0 - 10.5 K/uL   RBC 2.92 (L) 3.87 - 5.11 MIL/uL   Hemoglobin 11.2 (L) 12.0 - 15.0 g/dL   HCT 68.9 (L) 63.9 - 53.9 %   MCV 106.2 (H) 80.0 - 100.0 fL   MCH 38.4 (H) 26.0 - 34.0 pg   MCHC 36.1 (H) 30.0 - 36.0 g/dL   RDW 84.1 (H) 88.4 - 84.4 %   Platelets 27 (LL) 150 - 400 K/uL    Comment: SPECIMEN CHECKED FOR CLOTS PLATELET COUNT CONFIRMED BY SMEAR Immature Platelet Fraction may be clinically indicated, consider ordering this additional test [id number] REPEATED TO VERIFY CRITICAL RESULT CALLED TO, READ BACK BY AND VERIFIED WITH: K. PIETY, RN at [phone] 08/08/24 by Roider Satrain    nRBC 0.0 0.0 - 0.2 %    Comment: Performed at [facility], [address] [place], [place] [address]  Ammonia     Status: Abnormal   Collection Time: 08/08/24  9:18 PM  Result Value Ref Range   Ammonia 57 (H) 9 - 35 umol/L    Comment: Performed at [facility], [address], [place], [place] [address]  ABO/Rh     Status: None   Collection Time: 08/08/24  9:30 PM  Result Value Ref Range   ABO/RH(D)      O POS Performed at [facility], [address] [place], [place] [address]   Lactic acid, plasma     Status: Abnormal   Collection Time: 08/09/24 12:10 AM  Result Value Ref Range   Lactic Acid, Venous 3.2 (HH) 0.5 - 1.9 mmol/L    Comment: CRITICAL VALUE NOTED. VALUE IS CONSISTENT WITH PREVIOUSLY REPORTED/CALLED VALUE Performed at [facility], [address], [place], [place] [address]   Prepare platelet pheresis     Status: None   Collection Time: 08/09/24  1:30 AM  Result Value Ref Range   Unit Number [id number]    Blood Component Type PLTP1 PSORALEN TREATED    Unit division 00     Status of Unit REL FROM [facility]    Transfusion Status      OK TO TRANSFUSE Performed at [facility], [address], [place], [place] [address]   Prepare fresh frozen plasma     Status: None (Preliminary result)   Collection Time: 08/09/24  1:31 AM  Result Value Ref Range   Unit Number [id number]    Blood Component Type THW PLS APHR    Unit division 00    Status of Unit ALLOCATED    Transfusion Status OK TO TRANSFUSE   Basic metabolic panel with GFR     Status: Abnormal   Collection Time: 08/09/24  5:18 AM  Result Value Ref Range   Sodium 126 (L) 135 - 145 mmol/L   Potassium 3.1 (L) 3.5 - 5.1 mmol/L   Chloride 93 (L) 98 - 111 mmol/L   CO2 21 (L) 22 - 32 mmol/L   Glucose, Bld 98 70 - 99 mg/dL    Comment: Glucose reference range applies only to samples taken after fasting for at least 8 hours.   BUN <5 (L) 6 - 20 mg/dL   Creatinine, Ser 9.23 0.44 - 1.00 mg/dL   Calcium 7.2 (L) 8.9 - 10.3 mg/dL   GFR, Estimated >39 >39 mL/min    Comment: (NOTE) Calculated using the CKD-EPI Creatinine Equation (2021)    Anion gap 12 5 - 15    Comment: Performed at [facility], [address], [place], [place] [address]  CBC     Status: Abnormal   Collection Time: 08/09/24  5:18 AM  Result Value Ref Range   WBC 7.5 4.0 - 10.5 K/uL   RBC 2.65 (L) 3.87 - 5.11 MIL/uL   Hemoglobin 10.1 (L) 12.0 - 15.0 g/dL   HCT 72.0 (L) 63.9 - 53.9 %   MCV 105.3 (H) 80.0 - 100.0 fL   MCH 38.1 (H) 26.0 - 34.0 pg   MCHC 36.2 (H) 30.0 - 36.0 g/dL   RDW 84.0 (H) 88.4 - 84.4 %   Platelets 43 (L) 150 - 400 K/uL    Comment: SPECIMEN CHECKED FOR CLOTS REPEATED TO VERIFY PLATELET COUNT CONFIRMED BY SMEAR Immature Platelet Fraction may be clinically indicated, consider ordering this additional test [id number]    nRBC  0.3 (H) 0.0 - 0.2 %    Comment: Performed at [facility] Lab, [address], [place], [place] [address]  Magnesium      Status: Abnormal   Collection Time: 08/09/24  5:18 AM   Result Value Ref Range   Magnesium  1.3 (L) 1.7 - 2.4 mg/dL    Comment: Performed at [facility], [address] [place], [place] [address]  Phosphorus     Status: Abnormal   Collection Time: 08/09/24  5:18 AM  Result Value Ref Range   Phosphorus 1.8 (L) 2.5 - 4.6 mg/dL    Comment: Performed at [facility], [address], [place], [place] [address]  Hepatic function panel     Status: Abnormal   Collection Time: 08/09/24  5:18 AM  Result Value Ref Range   Total Protein 6.6 6.5 - 8.1 g/dL   Albumin 1.7 (L) 3.5 - 5.0 g/dL   AST 744 (H) 15 - 41 U/L   ALT 56 (H) 0 - 44 U/L   Alkaline Phosphatase 89 38 - 126 U/L   Total Bilirubin 5.9 (H) 0.0 - 1.2 mg/dL   Bilirubin, Direct 2.8 (H) 0.0 - 0.2 mg/dL   Indirect Bilirubin 3.1 (H) 0.3 - 0.9 mg/dL    Comment: Performed at [facility], [address] [place], [place] [address]  Vitamin B12     Status: Abnormal   Collection Time: 08/09/24  5:18 AM  Result Value Ref Range   Vitamin B-12 1,051 (H) 180 - 914 pg/mL    Comment: (NOTE) This assay is not validated for testing neonatal or myeloproliferative syndrome specimens for Vitamin B12 levels. Performed at [facility], [address] [place], [place] [address]   Folate     Status: Abnormal   Collection Time: 08/09/24  5:18 AM  Result Value Ref Range   Folate 3.2 (L) >5.9 ng/mL    Comment: Performed at [facility], [address], [place], [place] [address]  Lactic acid, plasma     Status: Abnormal   Collection Time: 08/09/24  5:18 AM  Result Value Ref Range   Lactic Acid, Venous 2.5 (HH) 0.5 - 1.9 mmol/L    Comment: CRITICAL VALUE NOTED. VALUE IS CONSISTENT WITH PREVIOUSLY REPORTED/CALLED VALUE Performed at [facility], [address] [place], [place] [address]   Transfusion reaction     Status: None (Preliminary result)   Collection Time: 08/09/24  5:18 AM  Result Value Ref Range   Post RXN DAT IgG NEG    DAT C3      NEG Performed at  [facility], [address], [place], [place] [address]    Path interp tx rxn PENDING   Lactate dehydrogenase     Status: Abnormal   Collection Time: 08/09/24  5:18 AM  Result Value Ref Range   LDH 354 (H) 98 - 192 U/L    Comment: Performed at [facility], [address] [place], [place] [address]  Protime-INR     Status: Abnormal   Collection Time: 08/09/24  5:18 AM  Result Value Ref Range   Prothrombin Time 21.0 (H) 11.4 - 15.2 seconds   INR 1.7 (H) 0.8 - 1.2    Comment: (NOTE) INR goal varies based on device and disease states. Performed at [facility], [address] [place], [place] [address]     DG Chest 1 View Result Date: 08/08/2024 CLINICAL DATA:  Decompensated cirrhosis. EXAM: CHEST  1 VIEW COMPARISON:  Chest radiograph dated 12/23/2011 FINDINGS: Shallow inspiration. Trace right pleural effusion. There are bibasilar atelectasis. No pneumothorax. The cardiac silhouette is within normal limits. No acute osseous pathology. IMPRESSION: Trace right pleural effusion and bibasilar atelectasis. Electronically Signed   By: Vanetta Chou M.D.   On: 08/08/2024 20:54   CT ABDOMEN PELVIS W CONTRAST Result Date: 08/08/2024 CLINICAL DATA:  Acute abdominal pain.  Jaundice. EXAM: CT ABDOMEN AND PELVIS WITH CONTRAST TECHNIQUE: Multidetector CT imaging of the abdomen and pelvis was performed using the standard protocol following bolus administration of intravenous contrast. RADIATION DOSE REDUCTION: This exam was performed according to the departmental dose-optimization program which includes automated exposure control, adjustment of the mA and/or kV according to patient size and/or use of iterative reconstruction technique. CONTRAST:  100mL OMNIPAQUE  IOHEXOL  300 MG/ML  SOLN COMPARISON:  None Available. FINDINGS: Lower Chest: Tiny right pleural effusion and mild right lower lobe atelectasis. Hepatobiliary: Mild capsular nodularity of the liver is noted. Numerous punctate  low-attenuation nodules are seen throughout the hepatic parenchyma. These findings are consistent with micronodular cirrhosis. No hypervascular hepatic masses are identified. Perihepatic ascites noted. Tiny calcified gallstone noted, without signs of cholecystitis or biliary ductal dilatation. No evidence of biliary ductal dilatation. Pancreas:  No mass or inflammatory changes. Spleen: Within normal limits in size and appearance. Adrenals/Urinary Tract: No suspicious masses identified. No evidence of ureteral calculi or hydronephrosis. Unremarkable unopacified urinary bladder. Stomach/Bowel: No evidence of obstruction, inflammatory process or abnormal fluid collections. Vascular/Lymphatic: No pathologically enlarged lymph nodes. No acute vascular findings. Portosystemic venous collaterals are seen in the upper abdomen with gastroesophageal varices, consistent with portal venous hypertension. Reproductive:  No pelvic mass or inflammatory process identified. Other:  Moderate ascites and diffuse mesenteric edema seen. Musculoskeletal:  No suspicious bone lesions identified. IMPRESSION: Findings consistent with micronodular hepatic cirrhosis, and portal venous hypertension with gastroesophageal varices. No radiographic evidence of hepatocellular carcinoma. Moderate ascites and diffuse mesenteric edema. Cholelithiasis. No radiographic evidence of cholecystitis or biliary ductal dilatation. Tiny right pleural effusion and mild right lower lobe atelectasis. Electronically Signed   By: Norleen DELENA Kil M.D.   On: 08/08/2024 15:34   CT Temporal Bones Wo Contrast Result Date: 08/08/2024 EXAM: CT TEMPORAL BONES WITHOUT CONTRAST 08/08/2024 03:07:34 PM TECHNIQUE: CT of the temporal bones was performed without the administration of intravenous contrast. Multiplanar reformatted images are provided for review. Automated exposure control, iterative reconstruction, and/or weight based adjustment of the mA/kV was utilized to reduce the  radiation dose to as low as reasonably achievable. COMPARISON: None available. CLINICAL HISTORY: Ataxia, head trauma; Persistent Bleeding from Ear, TM perforation. left ear bleeding this morning. No injuries to area., gen abd pain. Patient appears jaundice but not aware FINDINGS: RIGHT TEMPORAL BONE: EXTERNAL AUDITORY CANAL: Clear. No bony erosion. Scutum is intact. MIDDLE EAR CAVITY: Clear. Ossicular chain is intact. MASTOID AIR CELLS: Clear. INNER EAR: The cochlea and vestibule are unremarkable. Normal mineralization of the otic capsule. Normal semicircular canals. The vestibular aqueduct is not dilated. INTERNAL AUDITORY CANAL: Unremarkable. Normal bony canal of the facial nerve. LEFT TEMPORAL BONE: EXTERNAL AUDITORY CANAL: Large amount of external material at the opening of the left external auditory canal. The left external auditory canal is completely opacified. MIDDLE EAR CAVITY: There is partial opacification of the left middle ear cavity with sparing of the epitympanum. The tympanic membrane is not visible because of the abutting soft tissue density material. No ossicular erosion. MASTOID AIR CELLS: Clear. INNER  EAR: The cochlea and vestibule are unremarkable. Normal mineralization of the otic capsule. Normal semicircular canals. The vestibular aqueduct is not dilated. INTERNAL AUDITORY CANAL: Unremarkable. Normal bony canal of the facial nerve. VASCULAR: Normal jugular bulbs. Normal carotid canals. BRAIN: Unremarkable. ORBITS: No acute abnormality. SINUSES: Clear. IMPRESSION: 1. Large amount of external material at the opening of the left external auditory canal and complete opacification of the canal, obscuring the tympanic membrane. 2. Partial opacification of the left middle ear cavity with sparing of the epitympanum. No ossicular erosion. Electronically signed by: Franky Stanford MD 08/08/2024 03:30 PM EDT RP Workstation: [id number]    ROS:as above  Blood pressure 114/67, pulse (!) 108, temperature  (!) 100.7 F (38.2 C), temperature source (P) Oral, resp. rate 20, height 5' 3 (1.6 m), weight 53.5 kg, SpO2 92%.  PHYSICAL EXAM:  CONSTITUTIONAL: well developed, nourished, no distress and alert and oriented x 3 CARDIOVASCULAR: normal rate PULMONARY/CHEST WALL: effort normal and no stridor, no stertor, no dysphonia HENT: Head : normocephalic and atraumatic Ears: Right ear:   canal normal, external ear normal and TM intact with aerated ME Left ear: fair amount of blood in canal so unable to visualize TM; likely small inferior canal laceration; no postauricular fluctuance, tenderness, erythema Nose: nose normal and no purulence Mouth/Throat:  Mouth: uvula midline and no oral lesions EYES: EOM normal and PERRL NECK: supple, trachea normal and no thyromegaly or cervical LAD  Studies Reviewed: CT Temporal bones without contrast 08/08/2024 independently interpreted, agree with read - left EAC opacification, partial left ME opacification without ossicular erosion; b/l mastoids well aerated CBC 08/09/2024: WBC 7.5, Plt 43  Assessment/Plan: 63 y.o. with:  Decompensated Cirrhosis Thrombocytopenia Left ear bleeding - Likely result of q-tip/trauma to canal. Given hemostatic currently, will leave dressing in place; some strike through expected and reinforce as needed; will change dressing tomorrow; recommend no qtips or trauma to ear canal; keep ear dry; start ciprodex  drops once dressing comes down tomorrow afternoon with 4 drops twice daily - Management of thrombocytopenia per primary team - If patient needs O2 supplemental, would recommend face mask, not nasal cannula to avoid epistaxis risk - ENT will see patient tomorrow  Eldora KATHEE Blanch   08/09/2024, 7:23 AM

## 2024-08-09 NOTE — Progress Notes (Signed)
 Provided prayer.    Rayleen Dade, [place],  [facility], Pager [phone]

## 2024-08-09 NOTE — Progress Notes (Signed)
 ENT Note: Notified by staff that patient had some oozing from ear. On evaluation, hemostatic. Dressing removed, and 4 otowicks placed in ear canal. Glasscock dressing placed. Noted hemostatic.  Some serosanguinous or bloody drainage expected for next 24-48 hours. Can reinforce gauze as needed after taking cup dressing off or replace gauze.  Tomorrow afternoon, can take glasscock dressing down, take out cotton ball from ear canal and start putting ciprodex  ear drops on top of visible red packing in ear canal twice daily 4 drops. Replace cotton ball afterwards. In 48 hours if hemostatic, can just take off glasscock dressing. No submerging head under water Can shower in 48 hours and wash hair beauty parlor style, but would recommend cotton ball coated in vaseline on left ear.   ENT will follow peripherally - will change otowicks in 2 weeks if still in hospital, otherwise page for outpatient follow up Eldora KATHEE Blanch

## 2024-08-09 NOTE — Progress Notes (Signed)
 PHARMACY - PHYSICIAN COMMUNICATION CRITICAL VALUE ALERT - BLOOD CULTURE IDENTIFICATION (BCID)  Rachel Lucas is an 63 y.o. female who presented to [facility] on 08/08/2024 with a chief complaint of liver cirrhosis  Assessment:  Pt with kleb pneumo bacteremia  Name of physician (or Provider) Contacted: Dr. Raenelle  Current antibiotics: Rocephin  1gm IV q24h  Changes to prescribed antibiotics recommended:  Change Rocephin  to 2gm IV q24h  Results for orders placed or performed during the hospital encounter of 08/08/24  Blood Culture ID Panel (Reflexed) (Collected: 08/08/2024  9:18 PM)  Result Value Ref Range   Enterococcus faecalis NOT DETECTED NOT DETECTED   Enterococcus Faecium NOT DETECTED NOT DETECTED   Listeria monocytogenes NOT DETECTED NOT DETECTED   Staphylococcus species NOT DETECTED NOT DETECTED   Staphylococcus aureus (BCID) NOT DETECTED NOT DETECTED   Staphylococcus epidermidis NOT DETECTED NOT DETECTED   Staphylococcus lugdunensis NOT DETECTED NOT DETECTED   Streptococcus species NOT DETECTED NOT DETECTED   Streptococcus agalactiae NOT DETECTED NOT DETECTED   Streptococcus pneumoniae NOT DETECTED NOT DETECTED   Streptococcus pyogenes NOT DETECTED NOT DETECTED   A.calcoaceticus-baumannii NOT DETECTED NOT DETECTED   Bacteroides fragilis NOT DETECTED NOT DETECTED   Enterobacterales DETECTED (A) NOT DETECTED   Enterobacter cloacae complex NOT DETECTED NOT DETECTED   Escherichia coli NOT DETECTED NOT DETECTED   Klebsiella aerogenes NOT DETECTED NOT DETECTED   Klebsiella oxytoca NOT DETECTED NOT DETECTED   Klebsiella pneumoniae DETECTED (A) NOT DETECTED   Proteus species NOT DETECTED NOT DETECTED   Salmonella species NOT DETECTED NOT DETECTED   Serratia marcescens NOT DETECTED NOT DETECTED   Haemophilus influenzae NOT DETECTED NOT DETECTED   Neisseria meningitidis NOT DETECTED NOT DETECTED   Pseudomonas aeruginosa NOT DETECTED NOT DETECTED   Stenotrophomonas  maltophilia NOT DETECTED NOT DETECTED   Candida albicans NOT DETECTED NOT DETECTED   Candida auris NOT DETECTED NOT DETECTED   Candida glabrata NOT DETECTED NOT DETECTED   Candida krusei NOT DETECTED NOT DETECTED   Candida parapsilosis NOT DETECTED NOT DETECTED   Candida tropicalis NOT DETECTED NOT DETECTED   Cryptococcus neoformans/gattii NOT DETECTED NOT DETECTED   CTX-M ESBL NOT DETECTED NOT DETECTED   Carbapenem resistance IMP NOT DETECTED NOT DETECTED   Carbapenem resistance KPC NOT DETECTED NOT DETECTED   Carbapenem resistance NDM NOT DETECTED NOT DETECTED   Carbapenem resist OXA 48 LIKE NOT DETECTED NOT DETECTED   Carbapenem resistance VIM NOT DETECTED NOT DETECTED    Vito Ralph, PharmD, BCPS Please see amion for complete clinical pharmacist phone list 08/09/2024  5:38 PM

## 2024-08-09 NOTE — Plan of Care (Addendum)
 Patient developed fever after first unit of platelet transfusion.  Holding the second unit of blood transfusion.  Patient declining both one-time dose of Tylenol  or ibuprofen  as she is very worried that she has a lot of medication side effect.  Will not give the second unit of platelet transfusion.   Plan to give the FFP after fever subsides.  Angely Dietz, MD [facility] 08/09/2024, 4:02 AM

## 2024-08-09 NOTE — Procedures (Signed)
 Patient presents for paracentesis therapeutic and diagnostic. US  limited abdomen shows trace amount of peritoneal fluid noted  Insufficient to perform a safe paracentesis. Procedure not performed.  Thank you for allowing our service to participate in CHAMPAGNE PALETTA 's care.  Electronically Signed: Lavanda JAYSON Jurist, PA-C   08/09/2024, 8:37 AM

## 2024-08-09 NOTE — Consult Note (Addendum)
 Consultation Note   Referring Provider:  [facility] PCP: Patient, No Pcp Per Primary Gastroenterologist:   Unassigned      Reason for Consultation:  Decompensated cirrhosis DOA: 08/08/2024         Hospital Day: 2   ASSESSMENT    Patient Profile:  63 y.o. 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 Also with probabe superimposed Etoh hepatitis.   MELD 3.0: 28 at 08/09/2024  5:18 AM Maddrey's discriminant function 33.5 portending poor prognosis.   ? GI bleed.  Reports of some blood in commode in Epic notes Patient not aware of any GI bleeding at home or inpatient.   Left ear bleeding Evaluated by ENT and felt to be secondary to Q-tip /trauma to canal.  Dressing is in place to start Ciprodex  drops tomorrow.  Received platelet transfusion to help with the bleeding.     Principal Problem:   Liver failure (HCC) Active Problems:   Blood transfusion reaction-febrile illness      PLAN:   --CIWA protocol in place --Continue thiamine  , Ensure --Continue twice daily IV PPI --Continue IV Rocephin  given ?  of GI bleed --Low-dose lactulose  started  --Trend H&H --If continues to bleed from left ear consider giving vitamin K --Despite moderate amount of ascites on CT scan, ultrasound by IR showed insufficient quantity for safe paracentesis today. --Will need to hold off on starting diuretics until sodium improves --She can have a 2 g sodium restricted diet --Will need EGD for esophageal varices screening ( inpatient versus outpatient).  --Hemochromatosis studies given the markedly elevated ferritin (though probably alcohol related ).  Chronic hep B and hepatitis C studies are negative. In outpatient setting will need  to complete remainder of hepatic serologic workup to evaluate for any other causes of cirrhosis.  Additionally will need AFP --Need for indefinite, total abstinence from alcohol.  This was discussed with the patient as well as her husband in the room during this visit   HPI   Patient presented to the ED yesterday with bleeding from her left ear and jaundice.  She has been admitted with newly diagnosed decompensated cirrhosis and ?  GI bleed.  Notes in epic mention some blood in the commode but patient is not aware of any gastrointestinal bleeding from below or above so details are unclear.  Her chiropractor mentioned that she looked jaundiced a while back but she herself says that she had not noticed it.  She has never been told of any prior history of liver disease.  She reports drinking 2 to 3 glasses of wine a night.  No family history of liver disease.   Currently her hemoglobin is 10, down from 11.5 yesterday MCV 105 Platelets 43, up from 27 INR 1.7 Sodium 126 Renal function is normal Phosphorus low at 1.8 Mag low at 1.3 Albumin low at 1.7 AST 255 /ALT 56 Ammonia 57 Total bilirubin 5.9, down from 7.2 yesterday Direct bilirubin 2.8 Ferritin 4931 Lactic acid 2.5, down to 3.1 B12 1051 Viral hepatitis B surface antigen negative, HCV antibody negative  CTAP with  contrast Findings consistent with micronodular hepatic cirrhosis, and portal venous hypertension with gastroesophageal varices. No radiographic evidence of hepatocellular carcinoma. Moderate ascites and diffuse mesenteric edema. Cholelithiasis. No radiographic evidence of cholecystitis or biliary ductal dilatation. Tiny right pleural effusion and mild right lower lobe atelectasis.  Limited ultrasound IR shows only trace amount of ascites, insufficient quantity for safe paracentesis    Labs and Imaging:  Recent Labs    08/08/24 1325 08/08/24 2118 08/09/24 0518  PROT 7.6 7.3 6.6  ALBUMIN 2.7* 1.8* 1.7*  AST 353* 299*  255*  ALT 77* 64* 56*  ALKPHOS 148* 113 89  BILITOT 6.7* 7.2* 5.9*  BILIDIR  --  3.3* 2.8*  IBILI  --  3.9* 3.1*   Recent Labs    08/08/24 1325 08/08/24 2118 08/09/24 0518  WBC 9.8 8.1 7.5  HGB 11.5* 11.2* 10.1*  HCT 32.0* 31.0* 27.9*  MCV 105.6* 106.2* 105.3*  PLT 33* 27* 43*   Recent Labs    08/08/24 1325 08/08/24 2118 08/09/24 0518  NA 128* 126* 126*  K 3.9 3.6 3.1*  CL 89* 92* 93*  CO2 17* 20* 21*  GLUCOSE 88 123* 98  BUN 7 6 <5*  CREATININE 0.57 0.66 0.76  CALCIUM 8.0* 7.3* 7.2*     IR ABDOMEN US  LIMITED INDICATION: 63 year old female with cirrhosis and ascites for diagnostic paracentesis.  EXAM: ULTRASOUND ABDOMEN LIMITED  FINDINGS: Imaging of all 4 quadrants of the abdomen reveal very minimal ascites  IMPRESSION: Trace ascites seen in ultrasound.  After discussion of the risks versus benefits of the procedure the patient decided to defer paracentesis at this time.  Performed By Lavanda Jurist, PA-C  Electronically Signed   By: [name].  Shick M.D.   On: 08/09/2024 09:20     Past Medical History:  Diagnosis Date   Hypothyroidism    Thyroid  disease    Von Willebrand disease (HCC)     *Rachel Lucas has never had a colonoscopy.  She does give a history of negative Cologuard's, last one was a few years back.  There is a possible history of colon cancer in a sister   Past Surgical History:  Procedure Laterality Date   CYST REMOVAL NECK  1995    Family History  Problem Relation Age of Onset   Asthma Mother    Eczema Mother     Prior to Admission medications   Medication Sig Start Date End Date Taking? Authorizing Provider  levothyroxine  (SYNTHROID ) 88 MCG tablet Take 88 mcg by mouth daily before breakfast.   Yes [provider]    Current Facility-Administered Medications  Medication Dose Route Frequency Provider Last Rate Last Admin   0.9 %  sodium chloride  infusion (Manually program via Guardrails IV Fluids)   Intravenous Once  Sundil, Subrina, MD       0.9 %  sodium chloride  infusion (Manually program via Guardrails IV Fluids)   Intravenous Once Sundil, Subrina, MD       acetaminophen  (TYLENOL ) tablet 500 mg  500 mg Oral Once Segars, Jonathan, MD       albuterol  (PROVENTIL ) (2.5 MG/3ML) 0.083% nebulizer solution 2.5 mg  2.5 mg Nebulization Q4H PRN Segars, Dorn, MD       cefTRIAXone  (ROCEPHIN ) 1 g in sodium chloride  0.9 % 100 mL IVPB  1 g Intravenous Q24H Segars, Jonathan, MD 200 mL/hr at 08/08/24 2230 1 g at 08/08/24 2230   diazepam  (VALIUM ) tablet 0-20 mg  0-20 mg Oral Q4H PRN Keturah Dorn, MD  Or   diazepam  (VALIUM ) injection 0-20 mg  0-20 mg Intravenous Q4H PRN Segars, Dorn, MD       feeding supplement (BOOST / RESOURCE BREEZE) liquid 1 Container  1 Container Oral TID BM Anderson, Krystal G, RN   1 Container at 08/09/24 1038   [START ON 08/10/2024] folic acid  (FOLVITE ) tablet 2 mg  2 mg Oral Daily Ghimire, Donalda HERO, MD       furosemide  (LASIX ) tablet 40 mg  40 mg Oral Daily Ghimire, Donalda HERO, MD       ibuprofen  (ADVIL ) tablet 200 mg  200 mg Oral Once Sundil, Subrina, MD       levothyroxine  (SYNTHROID ) tablet 88 mcg  88 mcg Oral QAC breakfast Segars, Jonathan, MD   88 mcg at 08/09/24 9362   melatonin tablet 6 mg  6 mg Oral QHS PRN Segars, Jonathan, MD       multivitamin with minerals tablet 1 tablet  1 tablet Oral Daily Segars, Dorn, MD   1 tablet at 08/09/24 1038   ondansetron  (ZOFRAN ) injection 4 mg  4 mg Intravenous Q6H PRN Keturah Dorn, MD       pantoprazole  (PROTONIX ) injection 40 mg  40 mg Intravenous Q12H Segars, Jonathan, MD   40 mg at 08/09/24 1037   potassium PHOSPHATE  30 mmol in dextrose 5 % 500 mL infusion  30 mmol Intravenous Once Ghimire, Shanker M, MD 85 mL/hr at 08/09/24 1045 30 mmol at 08/09/24 1045   sodium chloride  flush (NS) 0.9 % injection 3 mL  3 mL Intravenous Q12H Segars, Jonathan, MD   3 mL at 08/09/24 1038   thiamine  (VITAMIN B1) tablet 100 mg  100 mg Oral Daily  Segars, Dorn, MD       Or   thiamine  (VITAMIN B1) injection 100 mg  100 mg Intravenous Daily Segars, Jonathan, MD   100 mg at 08/09/24 1038    Allergies as of 08/08/2024 - Review Complete 08/08/2024  Allergen Reaction Noted   Levofloxacin Other (See Comments) 03/14/2009   Sulfonamide derivatives Palpitations 03/14/2009    Social History   Socioeconomic History   Marital status: Married    Spouse name: Rachel Lucas   Number of children: 0   Years of education: 12   Highest education level: Bachelor's degree (e.g., BA, AB, BS)  Occupational History   Not on file  Tobacco Use   Smoking status: Former    Current packs/day: 0.00    Average packs/day: 0.2 packs/day for 10.0 years (2.0 ttl pk-yrs)    Types: Cigarettes    Start date: 12/21/1979    Quit date: 12/20/1989    Years since quitting: 34.6   Smokeless tobacco: Not on file   Tobacco comments:    Pt also notes passive exposure- father smoked in house growing up.   Vaping Use   Vaping status: Never Used  Substance and Sexual Activity   Alcohol use: Yes    Alcohol/week: 8.0 standard drinks of alcohol    Types: 8 Glasses of wine per week   Drug use: Never   Sexual activity: Yes    Birth control/protection: None  Other Topics Concern   Not on file  Social History Narrative   Not on file   Social Drivers of Health   Financial Resource Strain: Not on file  Food Insecurity: No Food Insecurity (08/08/2024)   Hunger Vital Sign    Worried About Running Out of Food in the Last Year: Never true    Ran Out of  Food in the Last Year: Never true  Transportation Needs: No Transportation Needs (08/08/2024)   PRAPARE - Administrator, Civil Service (Medical): No    Lack of Transportation (Non-Medical): No  Physical Activity: Not on file  Stress: Not on file  Social Connections: Not on file  Intimate Partner Violence: Not At Risk (08/08/2024)   Humiliation, Afraid, Rape, and Kick questionnaire    Fear of Current or  Ex-Partner: No    Emotionally Abused: No    Physically Abused: No    Sexually Abused: No     Code Status   Code Status: Full Code  Review of Systems: All systems reviewed and negative except where noted in HPI.  Physical Exam: Vital signs in last 24 hours: Temp:  [98 F (36.7 C)-102.8 F (39.3 C)] 98.9 F (37.2 C) (08/21 0815) Pulse Rate:  [79-118] 109 (08/21 0815) Resp:  [18-29] 27 (08/21 0900) BP: (104-135)/(66-85) 110/68 (08/21 0900) SpO2:  [92 %-100 %] 95 % (08/21 0700) Weight:  [53.5 kg] 53.5 kg (08/20 1954) Last BM Date :  (Patient and husband state she does not remember the last time patient had bowel movement)  General:  Pleasant female in NAD Psych:  Cooperative. Normal mood and affect Eyes: Pupils equal Ears:  Normal auditory acuity.  Fresh blood dressing over left ear Nose: No deformity, discharge or lesions Neck:  Supple, no masses felt Lungs:  Clear to auscultation.  Heart:  Regular rate, regular rhythm.  Abdomen:  Soft, moderately distended , Active bowel sounds, no masses felt Rectal :  Deferred Msk: Symmetrical without gross deformities.  Neurologic:  Alert, oriented, grossly normal neurologically.  No asterixis Extremities :  1-2+ Bilateral LE edema   Intake/Output from previous day: 08/20 0701 - 08/21 0700 In: 302 [Blood:302] Out: -  Intake/Output this shift:  No intake/output data recorded.   Rachel Dasen, NP-C   08/09/2024, 11:25 AM  I personally saw the patient and performed a substantive portion of this encounter (>50% time spent), including a complete performance of at least one of the key components (MDM, Hx and/or Exam), in conjunction with the APP.  I agree with the APP's note, impression, and recommendations with additional input as follows.   63 year old female with history of hypothyroidism, von Willebrand's disease, and alcohol use presented with left ear bleeding and jaundice.  Patient does have significant alcohol use history.   She denies any melena or hematochezia.  Denies confusion.  Her labs upon arrival are notable for significantly low sodium of 126 and elevated LFTs including total bilirubin of 6.9, AST 353, ALT 77, and alk phos of 148.  Patient is also noted to have significantly low platelets of 33 along with anemia with hemoglobin of 11.5.  INR is elevated at 1.6.  Acute hepatitis panel was negative.  Anemia workup showed a low folate of 3.2.  Iron studies appear to be more consistent with anemia of chronic disease.  Overall patient's presentation is consistent with alcoholic hepatitis. Her MDF is >32, which could justify treatment with prednisolone. However, patient's blood culture came back with 1/2 positive for enterobacterales and Klebsiella pneumonia. Thus steroids cannot be started until her infection is fully controlled. IR was not able to find enough fluid for a paracentesis. Urine studies are pending, though these were collected after patient was already started on ceftriaxone  therapy. Will perform a basic liver lab work up. Patient's significant hyponatremia will be monitored over time. Patient would likely benefit from EGD for variceal  surveillance in the future. Patient is being followed by ENT for left ear bleeding. Recommend keeping plts >50 to reduce risk of bleeding. Will give some vitamin K for elevated INR.  Rachel Kidney, MD

## 2024-08-09 NOTE — Progress Notes (Signed)
 PROGRESS NOTE        PATIENT DETAILS Name: Rachel Lucas Age: 63 y.o. Sex: female Date of Birth: 12-21-1960 Admit Date: 08/08/2024 Admitting Physician Mauricio Toribio Furnace, MD ERE:[facility], No Pcp Per  Brief Summary: Patient is a 63 y.o.  female with history of hypothyroidism-longstanding EtOH use-presented with left ear bleeding (Q-tip injury)-upon further evaluation-she was found to have new diagnosis of liver cirrhosis (apparently was told by her chiropractor that she looked yellow approximately 2 weeks back)  Significant events: 8/20>> admit to [facility]  Significant studies: 8/20>> CT abdomen/pelvis: Consistent with micronodular hepatic cirrhosis/portal venous hypertension with gastroesophageal varices.  Moderate ascites. 8/20>> CT temporal bones: Large amount of external material at the opening of the left external auditory canal-complete opacification of the canal.  Significant microbiology data: 8/20>> blood culture: No growth  Procedures: None  Consults: GI ENT  Subjective: Lying comfortably in bed-denies any chest pain or shortness of breath.  Objective: Vitals: Blood pressure 110/68, pulse (!) 109, temperature 98.9 F (37.2 C), temperature source Oral, resp. rate (!) 27, height 5' 3 (1.6 m), weight 53.5 kg, SpO2 95%.   Exam: Gen Exam:Alert awake-not in any distress HEENT:atraumatic, normocephalic Chest: B/L clear to auscultation anteriorly CVS:S1S2 regular Abdomen:soft non tender, non distended Extremities:+ edema Neurology: Non focal Skin: no rash  Pertinent Labs/Radiology:    Latest Ref Rng & Units 08/09/2024    5:18 AM 08/08/2024    9:18 PM 08/08/2024    1:25 PM  CBC  WBC 4.0 - 10.5 K/uL 7.5  8.1  9.8   Hemoglobin 12.0 - 15.0 g/dL 89.8  88.7  88.4   Hematocrit 36.0 - 46.0 % 27.9  31.0  32.0   Platelets 150 - 400 K/uL 43  27  33     Lab Results  Component Value Date   NA 126 (L) 08/09/2024   K 3.1 (L) 08/09/2024    CL 93 (L) 08/09/2024   CO2 21 (L) 08/09/2024      Assessment/Plan: Left ear bleeding Secondary Q-tip trauma to canal No further bleeding-ENT following  Fever. Likely secondary to left ear bleeding/possible otitis media.  No other source apparent-all cultures negative to date. IV Rocephin  Eardrops when hemostatic dressing removed.  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 Evaluated by radiology this a.m.-only trace amount of peritoneal fluid-insufficient for paracentesis-so doubt SBP Since not actively bleeding-do not think patient requires any further plasma or platelet infusions. GI following-await recommendations-regarding further workup to determine other alternative etiologies.  Alcoholic hepatitis Transaminase elevation pattern consistent with alcoholic hepatitis Discriminant function more than 32-admitting MD had discussed with GI-recommended to hold off on starting prednisone  due to question of GI bleed. Follow LFTs-await GI recommendations  ?  1 episode of hematochezia at med center Patient denies any hematochezia Hb relatively stable Has evidence of portal hypertension/varices by imaging Follow Hb-await GI input.  Normocytic anemia Likely secondary to underlying liver cirrhosis/folic acid  deficiency LDH somewhat elevated-bilirubin elevation is likely from liver issues Clinically does not appear to be hemolysis. Increase folic acid  to 2 mg daily Monitor for now  Hyponatremia Second to liver cirrhosis-does appear to have mild volume overload on exam Starting Lasix  Add Aldactone over the next several days  Hypokalemia/hypomagnesemia/hypophosphatemia Due to EtOH use Replete/recheck  EtOH use Last drink per patient on  8/16 Continue CIWA protocol Some tremors but no withdrawal major symptoms Counseled extensively regarding stopping any further alcohol use going forward.  Hypothyroidism TSH stable Synthroid   Code status:   Code Status: Full Code   DVT Prophylaxis: SCDs Start: 08/08/24 2113   Family Communication: None at bedside   Disposition Plan: Status is: Inpatient Remains inpatient appropriate because: Severity of illness   Planned Discharge Destination:Home   Diet: Diet Order             Diet clear liquid Room service appropriate? Yes; Fluid consistency: Thin  Diet effective now                     Antimicrobial agents: Anti-infectives (From admission, onward)    Start     Dose/Rate Route Frequency Ordered Stop   08/08/24 2200  cefTRIAXone  (ROCEPHIN ) 1 g in sodium chloride  0.9 % 100 mL IVPB        1 g 200 mL/hr over 30 Minutes Intravenous Every 24 hours 08/08/24 2133 08/15/24 2159        MEDICATIONS: Scheduled Meds:  sodium chloride    Intravenous Once   sodium chloride    Intravenous Once   acetaminophen   500 mg Oral Once   feeding supplement  1 Container Oral TID BM   folic acid   1 mg Oral Daily   ibuprofen   200 mg Oral Once   levothyroxine   88 mcg Oral QAC breakfast   multivitamin with minerals  1 tablet Oral Daily   pantoprazole  (PROTONIX ) IV  40 mg Intravenous Q12H   sodium chloride  flush  3 mL Intravenous Q12H   thiamine   100 mg Oral Daily   Or   thiamine   100 mg Intravenous Daily   Continuous Infusions:  cefTRIAXone  (ROCEPHIN )  IV 1 g (08/08/24 2230)   potassium PHOSPHATE  IVPB (in mmol) 30 mmol (08/09/24 1045)   PRN Meds:.albuterol , diazepam  **OR** diazepam , melatonin, ondansetron  (ZOFRAN ) IV   I have personally reviewed following labs and imaging studies  LABORATORY DATA: CBC: Recent Labs  Lab 08/08/24 1325 08/08/24 2118 08/09/24 0518  WBC 9.8 8.1 7.5  NEUTROABS 7.8*  --   --   HGB 11.5* 11.2* 10.1*  HCT 32.0* 31.0* 27.9*  MCV 105.6*  106.2* 105.3*  PLT 33* 27* 43*    Basic Metabolic Panel: Recent Labs  Lab 08/08/24 1325 08/08/24 2118 08/09/24 0518  NA 128* 126* 126*  K 3.9 3.6 3.1*  CL 89* 92* 93*  CO2 17* 20* 21*  GLUCOSE 88 123* 98  BUN 7 6 <5*  CREATININE 0.57 0.66 0.76  CALCIUM 8.0* 7.3* 7.2*  MG  --  1.5* 1.3*  PHOS  --  1.9* 1.8*    GFR: Estimated Creatinine Clearance: 61.9 mL/min (by C-G formula based on SCr of 0.76 mg/dL).  Liver Function Tests: Recent Labs  Lab 08/08/24 1325 08/08/24 2118 08/09/24 0518  AST 353* 299* 255*  ALT 77* 64* 56*  ALKPHOS 148* 113 89  BILITOT 6.7* 7.2* 5.9*  PROT 7.6 7.3 6.6  ALBUMIN 2.7* 1.8* 1.7*   Recent Labs  Lab 08/08/24 1325  LIPASE 57*   Recent Labs  Lab 08/08/24 2118  AMMONIA 57*    Coagulation Profile: Recent Labs  Lab 08/08/24 1429 08/09/24 0518  INR 1.6* 1.7*    Cardiac Enzymes: No results for input(s): CKTOTAL, CKMB, CKMBINDEX, TROPONINI in the last 168 hours.  BNP (last 3 results)  No results for input(s): PROBNP in the last 8760 hours.  Lipid Profile: No results for input(s): CHOL, HDL, LDLCALC, TRIG, CHOLHDL, LDLDIRECT in the last 72 hours.  Thyroid  Function Tests: Recent Labs    08/08/24 2118  TSH 3.397    Anemia Panel: Recent Labs    08/09/24 0518 08/09/24 0748  VITAMINB12 1,051*  --   FOLATE 3.2*  --   FERRITIN  --  4,931*  TIBC  --  NOT CALCULATED  IRON  --  42    Urine analysis:    Component Value Date/Time   COLORURINE AMBER (A) 08/09/2024 0752   APPEARANCEUR CLOUDY (A) 08/09/2024 0752   LABSPEC 1.015 08/09/2024 0752   PHURINE 6.5 08/09/2024 0752   GLUCOSEU NEGATIVE 08/09/2024 0752   HGBUR MODERATE (A) 08/09/2024 0752   BILIRUBINUR MODERATE (A) 08/09/2024 0752   KETONESUR 40 (A) 08/09/2024 0752   PROTEINUR 100 (A) 08/09/2024 0752   NITRITE NEGATIVE 08/09/2024 0752   LEUKOCYTESUR MODERATE (A) 08/09/2024 0752    Sepsis Labs: Lactic Acid, Venous    Component Value  Date/Time   LATICACIDVEN 2.5 (HH) 08/09/2024 0518    MICROBIOLOGY: Recent Results (from the past 240 hours)  Culture, blood (Routine X 2) w Reflex to ID Panel     Status: None (Preliminary result)   Collection Time: 08/08/24  9:18 PM   Specimen: BLOOD  Result Value Ref Range Status   Specimen Description BLOOD BLOOD LEFT ARM  Final   Special Requests   Final    BOTTLES DRAWN AEROBIC AND ANAEROBIC Blood Culture adequate volume   Culture   Final    NO GROWTH < 12 HOURS Performed at [facility], [address] [place], [place] [address]    Report Status PENDING  Incomplete  Culture, blood (Routine X 2) w Reflex to ID Panel     Status: None (Preliminary result)   Collection Time: 08/08/24  9:31 PM   Specimen: BLOOD  Result Value Ref Range Status   Specimen Description BLOOD BLOOD RIGHT HAND  Final   Special Requests   Final    BOTTLES DRAWN AEROBIC AND ANAEROBIC Blood Culture adequate volume   Culture   Final    NO GROWTH < 12 HOURS Performed at [facility], [address], [place], [place] [address]    Report Status PENDING  Incomplete    RADIOLOGY STUDIES/RESULTS: IR ABDOMEN US  LIMITED Result Date: 08/09/2024 INDICATION: 63 year old female with cirrhosis and ascites for diagnostic paracentesis. EXAM: ULTRASOUND ABDOMEN LIMITED FINDINGS: Imaging of all 4 quadrants of the abdomen reveal very minimal ascites IMPRESSION: Trace ascites seen in ultrasound. After discussion of the risks versus benefits of the procedure the patient decided to defer paracentesis at this time. Performed By Lavanda Jurist, PA-C Electronically Signed   By: [name].  Shick M.D.   On: 08/09/2024 09:20   DG Chest 1 View Result Date: 08/08/2024 CLINICAL DATA:  Decompensated cirrhosis. EXAM: CHEST  1 VIEW COMPARISON:  Chest radiograph dated 12/23/2011 FINDINGS: Shallow inspiration. Trace right pleural effusion. There are bibasilar atelectasis. No pneumothorax. The cardiac silhouette is within normal  limits. No acute osseous pathology. IMPRESSION: Trace right pleural effusion and bibasilar atelectasis. Electronically Signed   By: Vanetta Chou M.D.   On: 08/08/2024 20:54   CT ABDOMEN PELVIS W CONTRAST Result Date: 08/08/2024 CLINICAL DATA:  Acute abdominal pain.  Jaundice. EXAM: CT ABDOMEN AND PELVIS WITH CONTRAST TECHNIQUE: Multidetector CT imaging of the abdomen and pelvis was performed using the standard  protocol following bolus administration of intravenous contrast. RADIATION DOSE REDUCTION: This exam was performed according to the departmental dose-optimization program which includes automated exposure control, adjustment of the mA and/or kV according to patient size and/or use of iterative reconstruction technique. CONTRAST:  OMNIPAQUE  IOHEXOL  300 MG/ML  SOLN COMPARISON:  None Available. FINDINGS: Lower Chest: Tiny right pleural effusion and mild right lower lobe atelectasis. Hepatobiliary: Mild capsular nodularity of the liver is noted. Numerous punctate low-attenuation nodules are seen throughout the hepatic parenchyma. These findings are consistent with micronodular cirrhosis. No hypervascular hepatic masses are identified. Perihepatic ascites noted. Tiny calcified gallstone noted, without signs of cholecystitis or biliary ductal dilatation. No evidence of biliary ductal dilatation. Pancreas:  No mass or inflammatory changes. Spleen: Within normal limits in size and appearance. Adrenals/Urinary Tract: No suspicious masses identified. No evidence of ureteral calculi or hydronephrosis. Unremarkable unopacified urinary bladder. Stomach/Bowel: No evidence of obstruction, inflammatory process or abnormal fluid collections. Vascular/Lymphatic: No pathologically enlarged lymph nodes. No acute vascular findings. Portosystemic venous collaterals are seen in the upper abdomen with gastroesophageal varices, consistent with portal venous hypertension. Reproductive:  No pelvic mass or inflammatory  process identified. Other:  Moderate ascites and diffuse mesenteric edema seen. Musculoskeletal:  No suspicious bone lesions identified. IMPRESSION: Findings consistent with micronodular hepatic cirrhosis, and portal venous hypertension with gastroesophageal varices. No radiographic evidence of hepatocellular carcinoma. Moderate ascites and diffuse mesenteric edema. Cholelithiasis. No radiographic evidence of cholecystitis or biliary ductal dilatation. Tiny right pleural effusion and mild right lower lobe atelectasis. Electronically Signed   By: Norleen DELENA Kil M.D.   On: 08/08/2024 15:34   CT Temporal Bones Wo Contrast Result Date: 08/08/2024 EXAM: CT TEMPORAL BONES WITHOUT CONTRAST 08/08/2024 03:07:34 PM TECHNIQUE: CT of the temporal bones was performed without the administration of intravenous contrast. Multiplanar reformatted images are provided for review. Automated exposure control, iterative reconstruction, and/or weight based adjustment of the mA/kV was utilized to reduce the radiation dose to as low as reasonably achievable. COMPARISON: None available. CLINICAL HISTORY: Ataxia, head trauma; Persistent Bleeding from Ear, TM perforation. left ear bleeding this morning. No injuries to area., gen abd pain. Patient appears jaundice but not aware FINDINGS: RIGHT TEMPORAL BONE: EXTERNAL AUDITORY CANAL: Clear. No bony erosion. Scutum is intact. MIDDLE EAR CAVITY: Clear. Ossicular chain is intact. MASTOID AIR CELLS: Clear. INNER EAR: The cochlea and vestibule are unremarkable. Normal mineralization of the otic capsule. Normal semicircular canals. The vestibular aqueduct is not dilated. INTERNAL AUDITORY CANAL: Unremarkable. Normal bony canal of the facial nerve. LEFT TEMPORAL BONE: EXTERNAL AUDITORY CANAL: Large amount of external material at the opening of the left external auditory canal. The left external auditory canal is completely opacified. MIDDLE EAR CAVITY: There is partial opacification of the left middle  ear cavity with sparing of the epitympanum. The tympanic membrane is not visible because of the abutting soft tissue density material. No ossicular erosion. MASTOID AIR CELLS: Clear. INNER EAR: The cochlea and vestibule are unremarkable. Normal mineralization of the otic capsule. Normal semicircular canals. The vestibular aqueduct is not dilated. INTERNAL AUDITORY CANAL: Unremarkable. Normal bony canal of the facial nerve. VASCULAR: Normal jugular bulbs. Normal carotid canals. BRAIN: Unremarkable. ORBITS: No acute abnormality. SINUSES: Clear. IMPRESSION: 1. Large amount of external material at the opening of the left external auditory canal and complete opacification of the canal, obscuring the tympanic membrane. 2. Partial opacification of the left middle ear cavity with sparing of the epitympanum. No ossicular erosion. Electronically signed by: Franky Stanford MD 08/08/2024  03:30 PM EDT RP Workstation: [id number]     LOS: 1 day   Donalda Applebaum, MD  [facility]    To contact the attending provider between 7A-7P or the covering provider during after hours 7P-7A, please log into the web site [email or web address] and access using universal  password for that web site. If you do not have the password, please call the hospital operator.  08/09/2024, 10:50 AM

## 2024-08-09 NOTE — Progress Notes (Signed)
 PT Cancellation Note  Patient Details Name: Rachel Lucas MRN: [id number] DOB: 03-06-1961   Cancelled Treatment:    Reason Eval/Treat Not Completed: Other (comment). PT attempted x 2. Pt declining due to bowel incontinence and multiple trips to the bathroom. PT to re-attempt as time allows.    [name] 08/09/2024, 12:28 PM

## 2024-08-10 ENCOUNTER — Telehealth (INDEPENDENT_AMBULATORY_CARE_PROVIDER_SITE_OTHER): Payer: Self-pay | Admitting: Otolaryngology

## 2024-08-10 DIAGNOSIS — D696 Thrombocytopenia, unspecified: Secondary | ICD-10-CM | POA: Diagnosis not present

## 2024-08-10 DIAGNOSIS — K7011 Alcoholic hepatitis with ascites: Secondary | ICD-10-CM | POA: Diagnosis not present

## 2024-08-10 DIAGNOSIS — E871 Hypo-osmolality and hyponatremia: Secondary | ICD-10-CM | POA: Diagnosis not present

## 2024-08-10 DIAGNOSIS — K7010 Alcoholic hepatitis without ascites: Secondary | ICD-10-CM | POA: Diagnosis not present

## 2024-08-10 DIAGNOSIS — B9620 Unspecified Escherichia coli [E. coli] as the cause of diseases classified elsewhere: Secondary | ICD-10-CM

## 2024-08-10 DIAGNOSIS — R7881 Bacteremia: Secondary | ICD-10-CM | POA: Diagnosis not present

## 2024-08-10 DIAGNOSIS — B9689 Other specified bacterial agents as the cause of diseases classified elsewhere: Secondary | ICD-10-CM

## 2024-08-10 DIAGNOSIS — D529 Folate deficiency anemia, unspecified: Secondary | ICD-10-CM

## 2024-08-10 LAB — CBC
HCT: 27.5 % — ABNORMAL LOW (ref 36.0–46.0)
Hemoglobin: 10 g/dL — ABNORMAL LOW (ref 12.0–15.0)
MCH: 38.8 pg — ABNORMAL HIGH (ref 26.0–34.0)
MCHC: 36.4 g/dL — ABNORMAL HIGH (ref 30.0–36.0)
MCV: 106.6 fL — ABNORMAL HIGH (ref 80.0–100.0)
Platelets: 34 K/uL — ABNORMAL LOW (ref 150–400)
RBC: 2.58 MIL/uL — ABNORMAL LOW (ref 3.87–5.11)
RDW: 15.6 % — ABNORMAL HIGH (ref 11.5–15.5)
WBC: 7.8 K/uL (ref 4.0–10.5)
nRBC: 0 % (ref 0.0–0.2)

## 2024-08-10 LAB — BASIC METABOLIC PANEL WITH GFR
Anion gap: 7 (ref 5–15)
BUN: <5 mg/dL — ABNORMAL LOW (ref 6–20)
CO2: 25 mmol/L (ref 22–32)
Calcium: 7 mg/dL — ABNORMAL LOW (ref 8.9–10.3)
Chloride: 94 mmol/L — ABNORMAL LOW (ref 98–111)
Creatinine, Ser: 0.68 mg/dL (ref 0.44–1.00)
GFR, Estimated: >60 mL/min (ref >60)
Glucose, Bld: 88 mg/dL (ref 70–99)
Potassium: 2.8 mmol/L — ABNORMAL LOW (ref 3.5–5.1)
Sodium: 126 mmol/L — ABNORMAL LOW (ref 135–145)

## 2024-08-10 LAB — HEPATIC FUNCTION PANEL
ALT: 60 U/L — ABNORMAL HIGH (ref 0–44)
AST: 255 U/L — ABNORMAL HIGH (ref 15–41)
Albumin: 1.6 g/dL — ABNORMAL LOW (ref 3.5–5.0)
Alkaline Phosphatase: 90 U/L (ref 38–126)
Bilirubin, Direct: 2.9 mg/dL — ABNORMAL HIGH (ref 0.0–0.2)
Indirect Bilirubin: 3.1 mg/dL — ABNORMAL HIGH (ref 0.3–0.9)
Total Bilirubin: 6 mg/dL — ABNORMAL HIGH (ref 0.0–1.2)
Total Protein: 6.5 g/dL (ref 6.5–8.1)

## 2024-08-10 LAB — BPAM FFP
Blood Product Expiration Date: 202508262359
Unit Type and Rh: 5100

## 2024-08-10 LAB — HEPATITIS B CORE ANTIBODY, TOTAL: HEP B CORE AB: NEGATIVE

## 2024-08-10 LAB — PREPARE PLATELET PHERESIS: Unit division: 0

## 2024-08-10 LAB — PROTIME-INR
INR: 2 — ABNORMAL HIGH (ref 0.8–1.2)
Prothrombin Time: 23.7 s — ABNORMAL HIGH (ref 11.4–15.2)

## 2024-08-10 LAB — BPAM PLATELET PHERESIS
Blood Product Expiration Date: 202508232359
ISSUE DATE / TIME: 202508210039
Unit Type and Rh: 202508232359
Unit Type and Rh: 5100

## 2024-08-10 LAB — PHOSPHORUS: Phosphorus: 2.4 mg/dL — ABNORMAL LOW (ref 2.5–4.6)

## 2024-08-10 LAB — TRANSFUSION REACTION
DAT C3: NEGATIVE
Post RXN DAT IgG: NEGATIVE

## 2024-08-10 LAB — MAGNESIUM: Magnesium: 1.9 mg/dL (ref 1.7–2.4)

## 2024-08-10 LAB — PREPARE FRESH FROZEN PLASMA: Unit division: 0

## 2024-08-10 MED ORDER — SIMETHICONE 40 MG/0.6ML PO SUSP
40.0000 mg | Freq: Four times a day (QID) | ORAL | Status: DC | PRN
  Administered 2024-08-10 – 2024-08-15 (×7): 40 mg via ORAL
  Filled 2024-08-10 (×10): qty 0.6

## 2024-08-10 MED ORDER — MAGNESIUM SULFATE 2 GM/50ML IV SOLN
2.0000 g | Freq: Once | INTRAVENOUS | Status: AC
  Administered 2024-08-10: 2 g via INTRAVENOUS
  Filled 2024-08-10: qty 50

## 2024-08-10 MED ORDER — CIPROFLOXACIN-DEXAMETHASONE 0.3-0.1 % OT SUSP
4.0000 [drp] | Freq: Two times a day (BID) | OTIC | Status: DC
  Administered 2024-08-10 – 2024-08-12 (×6): 4 [drp] via OTIC
  Filled 2024-08-10 (×2): qty 7.5

## 2024-08-10 MED ORDER — POTASSIUM CHLORIDE 10 MEQ/100ML IV SOLN
10.0000 meq | INTRAVENOUS | Status: AC
  Administered 2024-08-10 (×4): 10 meq via INTRAVENOUS
  Filled 2024-08-10 (×4): qty 100

## 2024-08-10 MED ORDER — POTASSIUM CHLORIDE CRYS ER 20 MEQ PO TBCR
40.0000 meq | EXTENDED_RELEASE_TABLET | Freq: Once | ORAL | Status: AC
  Administered 2024-08-10: 40 meq via ORAL
  Filled 2024-08-10: qty 2

## 2024-08-10 MED ORDER — POTASSIUM PHOSPHATES 15 MMOLE/5ML IV SOLN
30.0000 mmol | Freq: Once | INTRAVENOUS | Status: AC
  Administered 2024-08-10: 30 mmol via INTRAVENOUS
  Filled 2024-08-10: qty 10

## 2024-08-10 NOTE — Progress Notes (Signed)
 PROGRESS NOTE        PATIENT DETAILS Name: Rachel Lucas Age: 63 y.o. Sex: female Date of Birth: November 24, 1961 Admit Date: 08/08/2024 Admitting Physician Mauricio Toribio Furnace, MD ERE:[id number], No Pcp Per  Brief Summary: Patient is a 63 y.o.  female with history of hypothyroidism-longstanding EtOH use-presented with left ear bleeding (Q-tip injury)-upon further evaluation-she was found to have new diagnosis of liver cirrhosis (apparently was told by her chiropractor that she looked yellow approximately 2 weeks back) and Klebsiella bacteremia.  Significant events: 8/20>> admit to [facility]  Significant studies: 8/20>> CT abdomen/pelvis: Consistent with micronodular hepatic cirrhosis/portal venous hypertension with gastroesophageal varices.  Moderate ascites. 8/20>> CT temporal bones: Large amount of external material at the opening of the left external auditory canal-complete opacification of the canal.  Significant microbiology data: 8/20>> blood culture: Klebsiella pneumoniae  Procedures: None  Consults: GI ENT  Subjective: Left ear dressing is uncomfortable but completely awake and alert.  No major issues overnight.  Objective: Vitals: Blood pressure 98/71, pulse (!) 107, temperature 99.5 F (37.5 C), temperature source Oral, resp. rate 20, height 5' 3 (1.6 m), weight 53.5 kg, SpO2 94%.   Exam: Gen Exam:Alert awake-not in any distress HEENT:atraumatic, normocephalic Chest: B/L clear to auscultation anteriorly CVS:S1S2 regular Abdomen:soft non tender, non distended Extremities:+ edema Neurology: Non focal Skin: no rash  Pertinent Labs/Radiology:    Latest Ref Rng & Units 08/10/2024    5:45 AM 08/09/2024    5:18 AM 08/08/2024    9:18 PM  CBC  WBC 4.0 - 10.5 K/uL 7.8  7.5  8.1   Hemoglobin 12.0 - 15.0 g/dL 89.9  89.8  88.7   Hematocrit 36.0 - 46.0 % 27.5  27.9  31.0   Platelets 150 - 400 K/uL 34  43  27     Lab Results  Component Value  Date   NA 126 (L) 08/10/2024   K 2.8 (L) 08/10/2024   CL 94 (L) 08/10/2024   CO2 25 08/10/2024      Assessment/Plan: Left ear bleeding Secondary Q-tip trauma to canal Appreciate ENT input-Glasscock dressing to be removed later today Once dressing removed-starting Ciprodex  4 drops twice daily.  Klebsiella bacteremia Unclear whether this is from left ear issues or from UTI (UA grossly abnormal-mild dysuria).  Also has new diagnosis of cirrhosis-not enough ascites for paracentesis per IR note on 8/21. Urine cultures ordered on 8/21-never sent CT abdomen without any major issues Continue IV Rocephin -once a bit more stable-will switch to oral antibiotic-7 days total antibiotics planned.  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  Alcoholic hepatitis Transaminase elevation pattern consistent with alcoholic hepatitis Discriminant function more than 32-unable to use steroids given active infection with bacteremia Follow LFTs. GI following.  ?  1 episode of hematochezia at med center Patient denies any hematochezia at med center-this was apparently seen by RN-no further hematochezia episodes since hospitalization Hb relatively stable Has evidence of portal hypertension/varices by CT imaging-GI planning endoscopy at some point in the future-possibly as an outpatient.  Normocytic anemia Likely secondary to underlying liver cirrhosis/folic acid  deficiency LDH somewhat elevated-bilirubin elevation is likely from liver issues Clinically  does not appear to be hemolysis. Continue folic acid  2 mg daily Monitor for  now  Hyponatremia Second to liver cirrhosis-does appear to have mild volume overload on exam Continue furosemide  Salt/fluid restriction emphasized. Add Aldactone over the next several days  Hypokalemia/hypomagnesemia/hypophosphatemia Due to EtOH use Replete/recheck  EtOH use Last drink per patient on 8/16 Continue CIWA protocol Some tremors but no withdrawal major symptoms Counseled extensively regarding stopping any further alcohol use going forward.  Hypothyroidism TSH stable Synthroid   Code status:   Code Status: Full Code   DVT Prophylaxis: SCDs Start: 08/08/24 2113   Family Communication: None at bedside   Disposition Plan: Status is: Inpatient Remains inpatient appropriate because: Severity of illness   Planned Discharge Destination:Home   Diet: Diet Order             Diet clear liquid Room service appropriate? Yes; Fluid consistency: Thin  Diet effective now                     Antimicrobial agents: Anti-infectives (From admission, onward)    Start     Dose/Rate Route Frequency Ordered Stop   08/09/24 2000  cefTRIAXone  (ROCEPHIN ) 2 g in sodium chloride  0.9 % 100 mL IVPB        2 g 200 mL/hr over 30 Minutes Intravenous Every 24 hours 08/09/24 1733     08/08/24 2200  cefTRIAXone  (ROCEPHIN ) 1 g in sodium chloride  0.9 % 100 mL IVPB  Status:  Discontinued        1 g 200 mL/hr over 30 Minutes Intravenous Every 24 hours 08/08/24 2133 08/09/24 1733        MEDICATIONS: Scheduled Meds:  feeding supplement  1 Container Oral TID BM   folic acid   2 mg Oral Daily   furosemide   40 mg Oral Daily   lactulose   10 g Oral BID   levothyroxine   88 mcg Oral QAC breakfast   multivitamin with minerals  1 tablet Oral Daily   pantoprazole  (PROTONIX ) IV  40 mg Intravenous Q12H   phytonadione   10 mg Oral Daily   potassium chloride   40 mEq Oral Once   sodium chloride  flush  3 mL Intravenous Q12H   thiamine   100 mg Oral Daily   Or   thiamine   100 mg  Intravenous Daily   Continuous Infusions:  cefTRIAXone  (ROCEPHIN )  IV 2 g (08/09/24 2048)   potassium chloride  10 mEq (08/10/24 1117)   potassium PHOSPHATE  IVPB (in mmol) 30 mmol (08/10/24 1007)   PRN Meds:.albuterol , diazepam  **OR** diazepam , melatonin, ondansetron  (ZOFRAN ) IV   I have personally reviewed following labs and imaging studies  LABORATORY DATA: CBC: Recent Labs  Lab 08/08/24 1325 08/08/24 2118 08/09/24 0518 08/10/24 0545  WBC 9.8 8.1 7.5 7.8  NEUTROABS 7.8*  --   --   --   HGB 11.5* 11.2* 10.1* 10.0*  HCT 32.0* 31.0* 27.9* 27.5*  MCV 105.6* 106.2* 105.3* 106.6*  PLT 33* 27* 43* 34*    Basic Metabolic Panel: Recent Labs  Lab 08/08/24 1325 08/08/24 2118 08/09/24 0518 08/10/24 0545  NA 128* 126* 126* 126*  K 3.9 3.6 3.1* 2.8*  CL 89* 92* 93* 94*  CO2 17* 20* 21* 25  GLUCOSE 88 123* 98 88  BUN 7 6 <5* <5*  CREATININE 0.57 0.66 0.76 0.68  CALCIUM 8.0* 7.3* 7.2* 7.0*  MG  --  1.5* 1.3* 1.9  PHOS  --  1.9* 1.8* 2.4*    GFR: Estimated Creatinine Clearance: 61.9  mL/min (by C-G formula based on SCr of 0.68 mg/dL).  Liver Function Tests: Recent Labs  Lab 08/08/24 1325 08/08/24 2118 08/09/24 0518 08/10/24 0545  AST 353* 299* 255* 255*  ALT 77* 64* 56* 60*  ALKPHOS 148* 113 89 90  BILITOT 6.7* 7.2* 5.9* 6.0*  PROT 7.6 7.3 6.6 6.5  ALBUMIN 2.7* 1.8* 1.7* 1.6*   Recent Labs  Lab 08/08/24 1325  LIPASE 57*   Recent Labs  Lab 08/08/24 2118  AMMONIA 57*    Coagulation Profile: Recent Labs  Lab 08/08/24 1429 08/09/24 0518 08/10/24 0545  INR 1.6* 1.7* 2.0*    Cardiac Enzymes: No results for input(s): CKTOTAL, CKMB, CKMBINDEX, TROPONINI in the last 168 hours.  BNP (last 3 results) No results for input(s): PROBNP in the last 8760 hours.  Lipid Profile: No results for input(s): CHOL, HDL, LDLCALC, TRIG, CHOLHDL, LDLDIRECT in the last 72 hours.  Thyroid  Function Tests: Recent Labs    08/08/24 2118  TSH 3.397     Anemia Panel: Recent Labs    08/09/24 0518 08/09/24 0748  VITAMINB12 1,051*  --   FOLATE 3.2*  --   FERRITIN  --  4,931*  TIBC  --  NOT CALCULATED  IRON  --  42    Urine analysis:    Component Value Date/Time   COLORURINE AMBER (A) 08/09/2024 0752   APPEARANCEUR CLOUDY (A) 08/09/2024 0752   LABSPEC 1.015 08/09/2024 0752   PHURINE 6.5 08/09/2024 0752   GLUCOSEU NEGATIVE 08/09/2024 0752   HGBUR MODERATE (A) 08/09/2024 0752   BILIRUBINUR MODERATE (A) 08/09/2024 0752   KETONESUR 40 (A) 08/09/2024 0752   PROTEINUR 100 (A) 08/09/2024 0752   NITRITE NEGATIVE 08/09/2024 0752   LEUKOCYTESUR MODERATE (A) 08/09/2024 0752    Sepsis Labs: Lactic Acid, Venous    Component Value Date/Time   LATICACIDVEN 2.5 (HH) 08/09/2024 0518    MICROBIOLOGY: Recent Results (from the past 240 hours)  Culture, blood (Routine X 2) w Reflex to ID Panel     Status: Abnormal (Preliminary result)   Collection Time: 08/08/24  9:18 PM   Specimen: BLOOD LEFT ARM  Result Value Ref Range Status   Specimen Description BLOOD LEFT ARM  Final   Special Requests   Final    BOTTLES DRAWN AEROBIC AND ANAEROBIC Blood Culture adequate volume   Culture  Setup Time   Final    GRAM NEGATIVE RODS CRITICAL RESULT CALLED TO, READ BACK BY AND VERIFIED WITH: PHARMD AMEND, K. [id number] AT 1728, ADC    Culture (A)  Final    KLEBSIELLA PNEUMONIAE SUSCEPTIBILITIES TO FOLLOW Performed at [facility], [address] [place], [place] [address]    Report Status PENDING  Incomplete  Blood Culture ID Panel (Reflexed)     Status: Abnormal   Collection Time: 08/08/24  9:18 PM  Result Value Ref Range Status   Enterococcus faecalis NOT DETECTED NOT DETECTED Final   Enterococcus Faecium NOT DETECTED NOT DETECTED Final   Listeria monocytogenes NOT DETECTED NOT DETECTED Final   Staphylococcus species NOT DETECTED NOT DETECTED Final   Staphylococcus aureus (BCID) NOT DETECTED NOT DETECTED Final   Staphylococcus  epidermidis NOT DETECTED NOT DETECTED Final   Staphylococcus lugdunensis NOT DETECTED NOT DETECTED Final   Streptococcus species NOT DETECTED NOT DETECTED Final   Streptococcus agalactiae NOT DETECTED NOT DETECTED Final   Streptococcus pneumoniae NOT DETECTED NOT DETECTED Final   Streptococcus pyogenes NOT DETECTED NOT DETECTED Final   A.calcoaceticus-baumannii NOT DETECTED NOT DETECTED  Final   Bacteroides fragilis NOT DETECTED NOT DETECTED Final   Enterobacterales DETECTED (A) NOT DETECTED Final    Comment: Enterobacterales represent a large order of gram negative bacteria, not a single organism. CRITICAL RESULT CALLED TO, READ BACK BY AND VERIFIED WITH: PHARMD AMEND, K. [id number] AT 1728, ADC    Enterobacter cloacae complex NOT DETECTED NOT DETECTED Final   Escherichia coli NOT DETECTED NOT DETECTED Final   Klebsiella aerogenes NOT DETECTED NOT DETECTED Final   Klebsiella oxytoca NOT DETECTED NOT DETECTED Final   Klebsiella pneumoniae DETECTED (A) NOT DETECTED Final    Comment: CRITICAL RESULT CALLED TO, READ BACK BY AND VERIFIED WITH: PHARMD AMEND, K. [id number] AT 1728, ADC    Proteus species NOT DETECTED NOT DETECTED Final   Salmonella species NOT DETECTED NOT DETECTED Final   Serratia marcescens NOT DETECTED NOT DETECTED Final   Haemophilus influenzae NOT DETECTED NOT DETECTED Final   Neisseria meningitidis NOT DETECTED NOT DETECTED Final   Pseudomonas aeruginosa NOT DETECTED NOT DETECTED Final   Stenotrophomonas maltophilia NOT DETECTED NOT DETECTED Final   Candida albicans NOT DETECTED NOT DETECTED Final   Candida auris NOT DETECTED NOT DETECTED Final   Candida glabrata NOT DETECTED NOT DETECTED Final   Candida krusei NOT DETECTED NOT DETECTED Final   Candida parapsilosis NOT DETECTED NOT DETECTED Final   Candida tropicalis NOT DETECTED NOT DETECTED Final   Cryptococcus neoformans/gattii NOT DETECTED NOT DETECTED Final   CTX-M ESBL NOT DETECTED NOT DETECTED Final   Carbapenem  resistance IMP NOT DETECTED NOT DETECTED Final   Carbapenem resistance KPC NOT DETECTED NOT DETECTED Final   Carbapenem resistance NDM NOT DETECTED NOT DETECTED Final   Carbapenem resist OXA 48 LIKE NOT DETECTED NOT DETECTED Final   Carbapenem resistance VIM NOT DETECTED NOT DETECTED Final    Comment: Performed at [facility], [address], [place], [place] [address]  Culture, blood (Routine X 2) w Reflex to ID Panel     Status: None (Preliminary result)   Collection Time: 08/08/24  9:31 PM   Specimen: BLOOD  Result Value Ref Range Status   Specimen Description BLOOD BLOOD RIGHT HAND  Final   Special Requests   Final    BOTTLES DRAWN AEROBIC AND ANAEROBIC Blood Culture adequate volume   Culture   Final    NO GROWTH 2 DAYS Performed at [facility], [address], [place], [place] [address]    Report Status PENDING  Incomplete    RADIOLOGY STUDIES/RESULTS: IR ABDOMEN US  LIMITED Result Date: 08/09/2024 INDICATION: 62 year old female with cirrhosis and ascites for diagnostic paracentesis. EXAM: ULTRASOUND ABDOMEN LIMITED FINDINGS: Imaging of all 4 quadrants of the abdomen reveal very minimal ascites IMPRESSION: Trace ascites seen in ultrasound. After discussion of the risks versus benefits of the procedure the patient decided to defer paracentesis at this time. Performed By Lavanda Jurist, PA-C Electronically Signed   By: [name].  Shick M.D.   On: 08/09/2024 09:20   DG Chest 1 View Result Date: 08/08/2024 CLINICAL DATA:  Decompensated cirrhosis. EXAM: CHEST  1 VIEW COMPARISON:  Chest radiograph dated 12/23/2011 FINDINGS: Shallow inspiration. Trace right pleural effusion. There are bibasilar atelectasis. No pneumothorax. The cardiac silhouette is within normal limits. No acute osseous pathology. IMPRESSION: Trace right pleural effusion and bibasilar atelectasis. Electronically Signed   By: Vanetta Chou M.D.   On: 08/08/2024 20:54   CT ABDOMEN PELVIS W CONTRAST Result Date:  08/08/2024 CLINICAL DATA:  Acute abdominal pain.  Jaundice. EXAM:  CT ABDOMEN AND PELVIS WITH CONTRAST TECHNIQUE: Multidetector CT imaging of the abdomen and pelvis was performed using the standard protocol following bolus administration of intravenous contrast. RADIATION DOSE REDUCTION: This exam was performed according to the departmental dose-optimization program which includes automated exposure control, adjustment of the mA and/or kV according to patient size and/or use of iterative reconstruction technique. CONTRAST:  100mL OMNIPAQUE  IOHEXOL  300 MG/ML  SOLN COMPARISON:  None Available. FINDINGS: Lower Chest: Tiny right pleural effusion and mild right lower lobe atelectasis. Hepatobiliary: Mild capsular nodularity of the liver is noted. Numerous punctate low-attenuation nodules are seen throughout the hepatic parenchyma. These findings are consistent with micronodular cirrhosis. No hypervascular hepatic masses are identified. Perihepatic ascites noted. Tiny calcified gallstone noted, without signs of cholecystitis or biliary ductal dilatation. No evidence of biliary ductal dilatation. Pancreas:  No mass or inflammatory changes. Spleen: Within normal limits in size and appearance. Adrenals/Urinary Tract: No suspicious masses identified. No evidence of ureteral calculi or hydronephrosis. Unremarkable unopacified urinary bladder. Stomach/Bowel: No evidence of obstruction, inflammatory process or abnormal fluid collections. Vascular/Lymphatic: No pathologically enlarged lymph nodes. No acute vascular findings. Portosystemic venous collaterals are seen in the upper abdomen with gastroesophageal varices, consistent with portal venous hypertension. Reproductive:  No pelvic mass or inflammatory process identified. Other:  Moderate ascites and diffuse mesenteric edema seen. Musculoskeletal:  No suspicious bone lesions identified. IMPRESSION: Findings consistent with micronodular hepatic cirrhosis, and portal venous  hypertension with gastroesophageal varices. No radiographic evidence of hepatocellular carcinoma. Moderate ascites and diffuse mesenteric edema. Cholelithiasis. No radiographic evidence of cholecystitis or biliary ductal dilatation. Tiny right pleural effusion and mild right lower lobe atelectasis. Electronically Signed   By: Norleen DELENA Kil M.D.   On: 08/08/2024 15:34   CT Temporal Bones Wo Contrast Result Date: 08/08/2024 EXAM: CT TEMPORAL BONES WITHOUT CONTRAST 08/08/2024 03:07:34 PM TECHNIQUE: CT of the temporal bones was performed without the administration of intravenous contrast. Multiplanar reformatted images are provided for review. Automated exposure control, iterative reconstruction, and/or weight based adjustment of the mA/kV was utilized to reduce the radiation dose to as low as reasonably achievable. COMPARISON: None available. CLINICAL HISTORY: Ataxia, head trauma; Persistent Bleeding from Ear, TM perforation. left ear bleeding this morning. No injuries to area., gen abd pain. Patient appears jaundice but not aware FINDINGS: RIGHT TEMPORAL BONE: EXTERNAL AUDITORY CANAL: Clear. No bony erosion. Scutum is intact. MIDDLE EAR CAVITY: Clear. Ossicular chain is intact. MASTOID AIR CELLS: Clear. INNER EAR: The cochlea and vestibule are unremarkable. Normal mineralization of the otic capsule. Normal semicircular canals. The vestibular aqueduct is not dilated. INTERNAL AUDITORY CANAL: Unremarkable. Normal bony canal of the facial nerve. LEFT TEMPORAL BONE: EXTERNAL AUDITORY CANAL: Large amount of external material at the opening of the left external auditory canal. The left external auditory canal is completely opacified. MIDDLE EAR CAVITY: There is partial opacification of the left middle ear cavity with sparing of the epitympanum. The tympanic membrane is not visible because of the abutting soft tissue density material. No ossicular erosion. MASTOID AIR CELLS: Clear. INNER EAR: The cochlea and vestibule are  unremarkable. Normal mineralization of the otic capsule. Normal semicircular canals. The vestibular aqueduct is not dilated. INTERNAL AUDITORY CANAL: Unremarkable. Normal bony canal of the facial nerve. VASCULAR: Normal jugular bulbs. Normal carotid canals. BRAIN: Unremarkable. ORBITS: No acute abnormality. SINUSES: Clear. IMPRESSION: 1. Large amount of external material at the opening of the left external auditory canal and complete opacification of the canal, obscuring the tympanic membrane. 2. Partial opacification of  the left middle ear cavity with sparing of the epitympanum. No ossicular erosion. Electronically signed by: Franky Stanford MD 08/08/2024 03:30 PM EDT RP Workstation: [id number]     LOS: 2 days   Donalda Applebaum, MD  [facility]    To contact the attending provider between 7A-7P or the covering provider during after hours 7P-7A, please log into the web site [email or web address] and access using universal [place] password for that web site. If you do not have the password, please call the hospital operator.  08/10/2024, 12:01 PM

## 2024-08-10 NOTE — Plan of Care (Signed)
   Problem: Education: Goal: Knowledge of General Education information will improve Description Including pain rating scale, medication(s)/side effects and non-pharmacologic comfort measures Outcome: Progressing   Problem: Health Behavior/Discharge Planning: Goal: Ability to manage health-related needs will improve Outcome: Progressing

## 2024-08-10 NOTE — Evaluation (Signed)
 Physical Therapy Evaluation Patient Details Name: Rachel Lucas MRN: [id number] DOB: Dec 19, 1961 Today's Date: 08/10/2024  History of Present Illness  Rachel Lucas is a 63 y.o. female who is transferred from [facility] ED with new diagnosed cirrhosis and likely alcoholic hepatitis. 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 Phx of hypothyroidism, HLD, alcohol use.  Clinical Impression  Pt presents with admitting diagnosis above. Pt today was able to ambulate short distance in hallway. Pt initially started out CGA with no AD however fatigued quickly requiring Min A HHA. Pt also noted to be very tremulous as gait progressed. PTA pt was fully independent. Recommend HHPT upon DC. PT will continue to follow.         If plan is discharge home, recommend the following: A little help with walking and/or transfers;A little help with bathing/dressing/bathroom;Assistance with cooking/housework;Direct supervision/assist for medications management;Assist for transportation;Help with stairs or ramp for entrance;Supervision due to cognitive status   Can travel by private vehicle        Equipment Recommendations Rolling walker (2 wheels)  Recommendations for Other Services       Functional Status Assessment Patient has had a recent decline in their functional status and demonstrates the ability to make significant improvements in function in a reasonable and predictable amount of time.     Precautions / Restrictions Precautions Precautions: Fall Recall of Precautions/Restrictions: Intact Restrictions Weight Bearing Restrictions Per Provider Order: No      Mobility  Bed Mobility Overal bed mobility: Modified Independent             General bed mobility comments: HOB elevated    Transfers Overall transfer level: Needs assistance Equipment used: 1 person hand held assist Transfers: Sit to/from Stand Sit to Stand: Min  assist           General transfer comment: Very light min A to stand. Pt requesting HHA.    Ambulation/Gait Ambulation/Gait assistance: Contact guard assist, Min assist Gait Distance (Feet): 100 Feet Assistive device: None, 1 person hand held assist Gait Pattern/deviations: Wide base of support, Drifts right/left, Decreased stride length, Decreased step length - right Gait velocity: decreased     General Gait Details: Pt initially started out CGA with no AD however fatigued quickly requiring Min A HHA. Pt also noted to be very tremulous as gait progressed.  Stairs            Wheelchair Mobility     Tilt Bed    Modified Rankin (Stroke Patients Only)       Balance Overall balance assessment: Mild deficits observed, not formally tested                                           Pertinent Vitals/Pain Pain Assessment Pain Assessment: Faces Faces Pain Scale: Hurts a little bit Pain Location: IV site Pain Descriptors / Indicators: Burning Pain Intervention(s): Monitored during session, Limited activity within patient's tolerance, Repositioned    Home Living Family/patient expects to be discharged to:: Private residence Living Arrangements: Spouse/significant other Available Help at Discharge: Family;Available PRN/intermittently Type of Home: House Home Access: Stairs to enter Entrance Stairs-Rails: Right;Left Entrance Stairs-Number of Steps: 3   Home Layout: Multi-level;Able to live on main level with bedroom/bathroom Home Equipment: Shower seat - built in      Prior Function  Prior Level of Function : Independent/Modified Independent             Mobility Comments: Ind ADLs Comments: Ind     Extremity/Trunk Assessment   Upper Extremity Assessment Upper Extremity Assessment: Overall WFL for tasks assessed    Lower Extremity Assessment Lower Extremity Assessment: Generalized weakness    Cervical / Trunk Assessment Cervical /  Trunk Assessment: Normal  Communication   Communication Communication: No apparent difficulties    Cognition Arousal: Alert Behavior During Therapy: WFL for tasks assessed/performed   PT - Cognitive impairments: Problem solving, Sequencing                       PT - Cognition Comments: Some slow processing noted. Following commands: Intact       Cueing Cueing Techniques: Verbal cues, Tactile cues     General Comments General comments (skin integrity, edema, etc.): VSS    Exercises     Assessment/Plan    PT Assessment Patient needs continued PT services  PT Problem List Decreased strength;Decreased range of motion;Decreased activity tolerance;Decreased balance;Decreased mobility;Decreased coordination;Decreased cognition;Decreased knowledge of use of DME;Decreased safety awareness;Decreased knowledge of precautions;Cardiopulmonary status limiting activity       PT Treatment Interventions DME instruction;Gait training;Functional mobility training;Stair training;Therapeutic activities;Therapeutic exercise;Balance training;Neuromuscular re-education;Cognitive remediation;Patient/family education    PT Goals (Current goals can be found in the Care Plan section)  Acute Rehab PT Goals Patient Stated Goal: to go home PT Goal Formulation: With patient Time For Goal Achievement: 08/24/24 Potential to Achieve Goals: Good    Frequency Min 2X/week     Co-evaluation               AM-PAC PT 6 Clicks Mobility  Outcome Measure Help needed turning from your back to your side while in a flat bed without using bedrails?: None Help needed moving from lying on your back to sitting on the side of a flat bed without using bedrails?: None Help needed moving to and from a bed to a chair (including a wheelchair)?: A Little Help needed standing up from a chair using your arms (e.g., wheelchair or bedside chair)?: A Little Help needed to walk in hospital room?: A Little Help  needed climbing 3-5 steps with a railing? : A Lot 6 Click Score: 19    End of Session Equipment Utilized During Treatment: Gait belt Activity Tolerance: Patient tolerated treatment well Patient left: in bed;with call bell/phone within reach;with bed alarm set;with family/visitor present Nurse Communication: Mobility status PT Visit Diagnosis: Other abnormalities of gait and mobility (R26.89)    Time: 8988-8963 PT Time Calculation (min) (ACUTE ONLY): 25 min   Charges:   PT Evaluation $PT Eval Moderate Complexity: 1 Mod PT Treatments $Gait Training: 8-22 mins PT General Charges $$ ACUTE PT VISIT: 1 Visit         Sueellen NOVAK, PT, DPT Acute Rehab Services [phone]   Raisa Ditto 08/10/2024, 12:09 PM

## 2024-08-10 NOTE — Progress Notes (Signed)
 Gastroenterology Inpatient Follow Up    Subjective: Left ear bleeding has resolved.  She now just has a cottonball in her left ear and not any overlying gauze.  ENT recommends some eardrops that have helped.  Patient has been passing stools that are brown/green.  She does not describe any excessive swelling at this time.  Objective: Vital signs in last 24 hours: Temp:  [99.1 F (37.3 C)-100.4 F (38 C)] 99.6 F (37.6 C) (08/22 1516) Pulse Rate:  [97-113] 97 (08/22 1600) Resp:  [20-23] 23 (08/22 1600) BP: (93-107)/(62-71) 95/65 (08/22 1600) SpO2:  [94 %-98 %] 94 % (08/22 1600) Last BM Date : 08/10/24  Intake/Output from previous day: 08/21 0701 - 08/22 0700 In: 0  Out: 500 [Urine:500] Intake/Output this shift: Total I/O In: 837.4 [IV Piggyback:837.4] Out: -   General appearance: alert and cooperative Resp: no increased WOB Cardio: regular rate GI: non-tender, soft, non-distended  Lab Results: Recent Labs    08/08/24 2118 08/09/24 0518 08/10/24 0545  WBC 8.1 7.5 7.8  HGB 11.2* 10.1* 10.0*  HCT 31.0* 27.9* 27.5*  PLT 27* 43* 34*   BMET Recent Labs    08/08/24 2118 08/09/24 0518 08/10/24 0545  NA 126* 126* 126*  K 3.6 3.1* 2.8*  CL 92* 93* 94*  CO2 20* 21* 25  GLUCOSE 123* 98 88  BUN 6 <5* <5*  CREATININE 0.66 0.76 0.68  CALCIUM 7.3* 7.2* 7.0*   LFT Recent Labs    08/10/24 0545  PROT 6.5  ALBUMIN 1.6*  AST 255*  ALT 60*  ALKPHOS 90  BILITOT 6.0*  BILIDIR 2.9*  IBILI 3.1*   PT/INR Recent Labs    08/09/24 0518 08/10/24 0545  LABPROT 21.0* 23.7*  INR 1.7* 2.0*   Hepatitis Panel Recent Labs    08/08/24 1602 08/09/24 0518  HEPBSAG NON REACTIVE NON REACTIVE  HCVAB NON REACTIVE NON REACTIVE  HEPAIGM NON REACTIVE  --   HEPBIGM NON REACTIVE  --    C-Diff No results for input(s): CDIFFTOX in the last 72 hours.  Studies/Results: IR ABDOMEN US  LIMITED Result Date: 08/09/2024 INDICATION: 63 year old female with cirrhosis and ascites  for diagnostic paracentesis. EXAM: ULTRASOUND ABDOMEN LIMITED FINDINGS: Imaging of all 4 quadrants of the abdomen reveal very minimal ascites IMPRESSION: Trace ascites seen in ultrasound. After discussion of the risks versus benefits of the procedure the patient decided to defer paracentesis at this time. Performed By Lavanda Jurist, PA-C Electronically Signed   By: [name].  Shick M.D.   On: 08/09/2024 09:20   DG Chest 1 View Result Date: 08/08/2024 CLINICAL DATA:  Decompensated cirrhosis. EXAM: CHEST  1 VIEW COMPARISON:  Chest radiograph dated 12/23/2011 FINDINGS: Shallow inspiration. Trace right pleural effusion. There are bibasilar atelectasis. No pneumothorax. The cardiac silhouette is within normal limits. No acute osseous pathology. IMPRESSION: Trace right pleural effusion and bibasilar atelectasis. Electronically Signed   By: Vanetta Chou M.D.   On: 08/08/2024 20:54    Medications: I have reviewed the patient's current medications. Scheduled:  ciprofloxacin -dexamethasone   4 drop Left EAR BID   feeding supplement  1 Container Oral TID BM   folic acid   2 mg Oral Daily   furosemide   40 mg Oral Daily   lactulose   10 g Oral BID   levothyroxine   88 mcg Oral QAC breakfast   multivitamin with minerals  1 tablet Oral Daily   pantoprazole  (PROTONIX ) IV  40 mg Intravenous Q12H   phytonadione   10 mg Oral Daily   potassium  chloride  40 mEq Oral Once   sodium chloride  flush  3 mL Intravenous Q12H   thiamine   100 mg Oral Daily   Or   thiamine   100 mg Intravenous Daily   Continuous:  cefTRIAXone  (ROCEPHIN )  IV 2 g (08/09/24 2048)   PRN:albuterol , diazepam  **OR** diazepam , melatonin, ondansetron  (ZOFRAN ) IV  Assessment/Plan: 63 year old female with history of hypothyroidism, von Willebrand's disease, and alcohol use presented with left ear bleeding and jaundice.   Alcoholic hepatitis Mild ascites Hyponatremia Elevated INR MDF would warrant steroid therapy but due to active infection we will  hold off for now.  Not enough ascites to be tapped.  Patient would not be able to tolerate diuretic therapy due to significant hyponatremia no signs of hepatic encephalopathy.  LFTs are slowly improving. - Continue to trend LFTs and sodium - Follow-up HFE, ASMA, ANA, and IgG - P.o. vitamin K 10 mg QD x 3 days - Low-sodium diet less than 2 g/day - Encouraged protein intake of 1 to 1.5 g/kg/day  - Nutrition consult ordered to help with diet education - Could reconsider prednisolone therapy in the future if infection is deemed to be well-controlled - Patient will need outpatient follow-up with GI in the future  Klebsiella/Enterobacterales bacteremia Source could be UTI since patient did describe some dysuria prior to presentation - Continue antibiotic therapy - Follow-up urine studies, though these were collected after she was already on antibiotic therapy  Anemia Low folate level Thrombocytopenia Left ear bleeding Patient would benefit from an EGD for variceal screening in the future, but I would hold off for now due to significant thrombocytopenia.  Left ear bleeding appears to be improving.  Hemoglobin remained stable today. -If patient has recurrent bleeding, then would recommend transfusing platelets to over 50 - Continue folate repletion   LOS: 2 days   Rosario JAYSON Kidney 08/10/2024, 5:37 PM

## 2024-08-10 NOTE — Telephone Encounter (Signed)
 Tried to call to schedule follow-up with Dr. Tobie - phone cut off.

## 2024-08-10 NOTE — Progress Notes (Signed)
 NT Note: Glasscock dressing removed, hemostatic without bleeding from ear.  Recommend ciprodex  ear drops 4 drops BID on top of visible red packing in ear canal for 2 weeks. Replace cotton ball afterwards Can shower tomorrow and wash hair beauty parlor style, but would recommend cotton ball coated in vaseline on left ear while showering   Will request follow up in 2 weeks Rachel Lucas

## 2024-08-11 DIAGNOSIS — K7011 Alcoholic hepatitis with ascites: Secondary | ICD-10-CM | POA: Diagnosis not present

## 2024-08-11 DIAGNOSIS — D696 Thrombocytopenia, unspecified: Secondary | ICD-10-CM | POA: Diagnosis not present

## 2024-08-11 DIAGNOSIS — B961 Klebsiella pneumoniae [K. pneumoniae] as the cause of diseases classified elsewhere: Secondary | ICD-10-CM

## 2024-08-11 DIAGNOSIS — R7881 Bacteremia: Secondary | ICD-10-CM | POA: Diagnosis not present

## 2024-08-11 DIAGNOSIS — E871 Hypo-osmolality and hyponatremia: Secondary | ICD-10-CM | POA: Diagnosis not present

## 2024-08-11 LAB — URINALYSIS, ROUTINE W REFLEX MICROSCOPIC
Bacteria, UA: NONE SEEN
Bilirubin Urine: NEGATIVE
Glucose, UA: NEGATIVE mg/dL
Ketones, ur: NEGATIVE mg/dL
Leukocytes,Ua: NEGATIVE
Nitrite: NEGATIVE
Protein, ur: NEGATIVE mg/dL
Specific Gravity, Urine: 1.014 (ref 1.005–1.030)
pH: 5 (ref 5.0–8.0)

## 2024-08-11 LAB — COMPREHENSIVE METABOLIC PANEL WITH GFR
ALT: 67 U/L — ABNORMAL HIGH (ref 0–44)
AST: 266 U/L — ABNORMAL HIGH (ref 15–41)
Albumin: 1.7 g/dL — ABNORMAL LOW (ref 3.5–5.0)
Alkaline Phosphatase: 98 U/L (ref 38–126)
Anion gap: 10 (ref 5–15)
BUN: 5 mg/dL — ABNORMAL LOW (ref 6–20)
CO2: 24 mmol/L (ref 22–32)
Calcium: 7.2 mg/dL — ABNORMAL LOW (ref 8.9–10.3)
Chloride: 91 mmol/L — ABNORMAL LOW (ref 98–111)
Creatinine, Ser: 0.8 mg/dL (ref 0.44–1.00)
GFR, Estimated: >60 mL/min (ref >60)
Glucose, Bld: 99 mg/dL (ref 70–99)
Potassium: 3 mmol/L — ABNORMAL LOW (ref 3.5–5.1)
Sodium: 125 mmol/L — ABNORMAL LOW (ref 135–145)
Total Bilirubin: 8.3 mg/dL — ABNORMAL HIGH (ref 0.0–1.2)
Total Protein: 7.3 g/dL (ref 6.5–8.1)

## 2024-08-11 LAB — ANA: Anti Nuclear Antibody (ANA): NEGATIVE

## 2024-08-11 LAB — CBC
HCT: 31.4 % — ABNORMAL LOW (ref 36.0–46.0)
Hemoglobin: 11.1 g/dL — ABNORMAL LOW (ref 12.0–15.0)
MCH: 37.8 pg — ABNORMAL HIGH (ref 26.0–34.0)
MCHC: 35.4 g/dL (ref 30.0–36.0)
MCV: 106.8 fL — ABNORMAL HIGH (ref 80.0–100.0)
Platelets: 35 K/uL — ABNORMAL LOW (ref 150–400)
RBC: 2.94 MIL/uL — ABNORMAL LOW (ref 3.87–5.11)
RDW: 15.4 % (ref 11.5–15.5)
WBC: 7.5 K/uL (ref 4.0–10.5)
nRBC: 0 % (ref 0.0–0.2)

## 2024-08-11 LAB — PROTIME-INR
INR: 2.3 — ABNORMAL HIGH (ref 0.8–1.2)
Prothrombin Time: 26.2 s — ABNORMAL HIGH (ref 11.4–15.2)

## 2024-08-11 LAB — OSMOLALITY, URINE: Osmolality, Ur: 387 mosm/kg (ref 300–900)

## 2024-08-11 LAB — CULTURE, BLOOD (ROUTINE X 2): Special Requests: ADEQUATE

## 2024-08-11 LAB — SODIUM, URINE, RANDOM: Sodium, Ur: <10 mmol/L

## 2024-08-11 LAB — URIC ACID: Uric Acid, Serum: 3.5 mg/dL (ref 2.5–7.1)

## 2024-08-11 LAB — MAGNESIUM: Magnesium: 1.8 mg/dL (ref 1.7–2.4)

## 2024-08-11 LAB — PHOSPHORUS: Phosphorus: 2.6 mg/dL (ref 2.5–4.6)

## 2024-08-11 LAB — CREATININE, URINE, RANDOM: Creatinine, Urine: 126 mg/dL

## 2024-08-11 LAB — OSMOLALITY: Osmolality: 264 mosm/kg — ABNORMAL LOW (ref 275–295)

## 2024-08-11 MED ORDER — ALBUMIN HUMAN 25 % IV SOLN
25.0000 g | Freq: Once | INTRAVENOUS | Status: AC
  Administered 2024-08-11: 25 g via INTRAVENOUS
  Filled 2024-08-11: qty 100

## 2024-08-11 MED ORDER — MIDODRINE HCL 5 MG PO TABS
5.0000 mg | ORAL_TABLET | Freq: Three times a day (TID) | ORAL | Status: DC
  Administered 2024-08-11 – 2024-08-15 (×12): 5 mg via ORAL
  Filled 2024-08-11 (×12): qty 1

## 2024-08-11 MED ORDER — FUROSEMIDE 40 MG PO TABS: 40.0000 mg | ORAL_TABLET | Freq: Every day | ORAL | Status: DC

## 2024-08-11 MED ORDER — MAGNESIUM SULFATE 2 GM/50ML IV SOLN
2.0000 g | Freq: Once | INTRAVENOUS | Status: AC
  Administered 2024-08-11: 2 g via INTRAVENOUS
  Filled 2024-08-11: qty 50

## 2024-08-11 MED ORDER — POTASSIUM CHLORIDE CRYS ER 20 MEQ PO TBCR
40.0000 meq | EXTENDED_RELEASE_TABLET | Freq: Four times a day (QID) | ORAL | Status: AC
  Administered 2024-08-11 (×2): 40 meq via ORAL
  Filled 2024-08-11 (×2): qty 2

## 2024-08-11 MED ORDER — FUROSEMIDE 40 MG PO TABS: 60.0000 mg | ORAL_TABLET | Freq: Every day | ORAL | Status: DC

## 2024-08-11 MED ORDER — ORAL CARE MOUTH RINSE
15.0000 mL | OROMUCOSAL | Status: DC | PRN
Start: 2024-08-11 — End: 2024-08-15

## 2024-08-11 MED ORDER — SODIUM CHLORIDE 0.9% IV SOLUTION: Freq: Once | INTRAVENOUS | Status: AC

## 2024-08-11 MED ORDER — DIPHENHYDRAMINE HCL 25 MG PO CAPS
25.0000 mg | ORAL_CAPSULE | Freq: Once | ORAL | Status: AC
  Administered 2024-08-11: 25 mg via ORAL
  Filled 2024-08-11: qty 1

## 2024-08-11 MED ORDER — ACETAMINOPHEN 325 MG PO TABS
650.0000 mg | ORAL_TABLET | Freq: Once | ORAL | Status: DC
Start: 2024-08-11 — End: 2024-08-13

## 2024-08-11 MED ORDER — SODIUM CHLORIDE 0.9 % IV SOLN: INTRAVENOUS | Status: AC

## 2024-08-11 MED ORDER — VITAMIN K1 10 MG/ML IJ SOLN: 10.0000 mg | Freq: Once | INTRAVENOUS | Status: DC

## 2024-08-11 NOTE — Progress Notes (Signed)
 Per Nightshift RN, patient has had significant bloody drainage from left ear during the early morning hours.  She paged K. Tobie, MD.  Per Dr. Tobie, oozing to be expected.  Ear drops and Afrin spray given per Dr. Tobie request.

## 2024-08-11 NOTE — Progress Notes (Signed)
 RN called to patient bedside by patient's spouse.  Family and patient concerned with continuous bloody drainage from left ear.  Rn noted saturated cotton ball in left ear and sanguineous drainage running down face and neck from left ear.  Cotton ball removed.  Puddle of drainage noted in ear.  Attempted to soak drainage with cotton ball, but more drainage occurred.  Does not appear any drainage is going down ear canal.   New cotton ball placed and ENT paged to come to bedside per family request.

## 2024-08-11 NOTE — Progress Notes (Signed)
 PROGRESS NOTE        PATIENT DETAILS Name: Rachel Lucas Age: 63 y.o. Sex: female Date of Birth: Nov 24, 1961 Admit Date: 08/08/2024 Admitting Physician Mauricio Toribio Furnace, MD ERE:[id number], No Pcp Per  Brief Summary: Patient is a 63 y.o.  female with history of hypothyroidism-longstanding EtOH use-presented with left ear bleeding (Q-tip injury)-upon further evaluation-she was found to have new diagnosis of liver cirrhosis (apparently was told by her chiropractor that she looked yellow approximately 2 weeks back) and Klebsiella bacteremia.  Significant events: 8/20>> admit to [facility]  Significant studies: 8/20>> CT abdomen/pelvis: Consistent with micronodular hepatic cirrhosis/portal venous hypertension with gastroesophageal varices.  Moderate ascites. 8/20>> CT temporal bones: Large amount of external material at the opening of the left external auditory canal-complete opacification of the canal.  Significant microbiology data: 8/20>> blood culture: Klebsiella pneumoniae  Procedures: None  Consults: GI ENT  Subjective: No chest pain or shortness of breath.  Lying comfortably in bed.  Objective: Vitals: Blood pressure 95/64, pulse 89, temperature 99 F (37.2 C), temperature source Oral, resp. rate 18, height 5' 3 (1.6 m), weight 55.5 kg, SpO2 93%.   Exam: Awake/alert-sclera still icteric Atraumatic/normocephalic Chest: Clear to auscultation CVS: S1-S2 regular Abdomen: Soft nontender nondistended Extremities: No edema Neurology: Nonfocal.  Pertinent Labs/Radiology:    Latest Ref Rng & Units 08/11/2024    9:43 AM 08/10/2024    5:45 AM 08/09/2024    5:18 AM  CBC  WBC 4.0 - 10.5 K/uL 7.5  7.8  7.5   Hemoglobin 12.0 - 15.0 g/dL 88.8  89.9  89.8   Hematocrit 36.0 - 46.0 % 31.4  27.5  27.9   Platelets 150 - 400 K/uL 35  34  43     Lab Results  Component Value Date   NA 125 (L) 08/11/2024   K 3.0 (L) 08/11/2024   CL 91 (L) 08/11/2024    CO2 24 08/11/2024      Assessment/Plan: Left ear bleeding Secondary Q-tip trauma to canal ENT following-Glasscock dressing removed 8/22-on Ciprodex  4 drops twice daily x 2 weeks See ENT note on 8/22 for wound/further instructions.  Klebsiella bacteremia Unclear whether this is from left ear issues or from UTI (UA grossly abnormal-mild dysuria).  No significant ascites on IR evaluation for paracentesis-hence unlikely to be SBP related bacteremia. Urine cultures ordered 8/21-never sent-doubt of any utility at this point CT abdomen/pelvis without any major urologic/biliary issues Continue IV Rocephin -since slowly improving-will plan on transition to oral antibiotic in the next day or so.  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  Alcoholic hepatitis Transaminase elevation pattern consistent with alcoholic hepatitis Discriminant function more than 32-unable to use steroids given active infection with bacteremia Follow LFTs. GI following.  ?  1 episode of hematochezia at med center Patient denies any hematochezia at med center-this was apparently seen by RN-no further hematochezia episodes since hospitalization Hb relatively stable Has evidence of portal hypertension/varices by CT imaging-GI planning endoscopy at some point in the future-possibly as an outpatient.  Normocytic anemia Likely secondary to underlying liver cirrhosis/folic acid  deficiency No active GI bleeding noted Hb stable and actually improving Continue folic  acid supplementation LDH minimally elevated-no clinical evidence of hemolysis.  Bilirubin is  elevated due to liver cirrhosis-pattern of bilirubin elevation is also not consistent with hemolysis. Follow CBC.  Hyponatremia Secondary liver cirrhosis has some leg edema-minimal ascites on imaging studies Continue salt/fluid restriction Increase furosemide  to 60 mg daily-add midodrine  for BP support Follow closely-currently asymptomatic.  Hypokalemia/hypomagnesemia/hypophosphatemia Due to EtOH use Continue to replete/recheck.  EtOH use Last drink per patient on 8/16 Would be out of window for any withdrawal symptoms Counseled extensively regarding stopping any further alcohol use going forward.  Hypothyroidism TSH stable Synthroid   Code status:   Code Status: Full Code   DVT Prophylaxis: SCDs Start: 08/08/24 2113   Family Communication: None at bedside   Disposition Plan: Status is: Inpatient Remains inpatient appropriate because: Severity of illness   Planned Discharge Destination:Home   Diet: Diet Order             Diet Heart Fluid consistency: Thin; Fluid restriction: 1500 mL Fluid  Diet effective now                     Antimicrobial agents: Anti-infectives (From admission, onward)    Start     Dose/Rate Route Frequency Ordered Stop   08/09/24 2000  cefTRIAXone  (ROCEPHIN ) 2 g in sodium chloride  0.9 % 100 mL IVPB        2 g 200 mL/hr over 30 Minutes Intravenous Every 24 hours 08/09/24 1733     08/08/24 2200  cefTRIAXone  (ROCEPHIN ) 1 g in sodium chloride  0.9 % 100 mL IVPB  Status:  Discontinued        1 g 200 mL/hr over 30 Minutes Intravenous Every 24 hours 08/08/24 2133 08/09/24 1733        MEDICATIONS: Scheduled Meds:  ciprofloxacin -dexamethasone   4 drop Left EAR BID   feeding supplement  1 Container Oral TID BM   folic acid   2 mg Oral Daily   furosemide   40 mg Oral Daily   lactulose   10 g Oral BID   levothyroxine   88 mcg Oral QAC breakfast   multivitamin with minerals  1 tablet Oral Daily   pantoprazole  (PROTONIX ) IV  40 mg  Intravenous Q12H   sodium chloride  flush  3 mL Intravenous Q12H   thiamine   100 mg Oral Daily   Or   thiamine   100 mg Intravenous Daily   Continuous Infusions:  cefTRIAXone  (ROCEPHIN )  IV 2 g (08/10/24 2031)   PRN Meds:.albuterol , diazepam  **OR** diazepam , melatonin, ondansetron  (ZOFRAN ) IV, mouth rinse, simethicone    I have personally reviewed following labs and imaging studies  LABORATORY DATA: CBC: Recent Labs  Lab 08/08/24 1325 08/08/24 2118 08/09/24 0518 08/10/24 0545 08/11/24 0943  WBC 9.8 8.1 7.5 7.8 7.5  NEUTROABS 7.8*  --   --   --   --   HGB 11.5* 11.2* 10.1* 10.0* 11.1*  HCT 32.0* 31.0* 27.9* 27.5* 31.4*  MCV 105.6* 106.2* 105.3* 106.6* 106.8*  PLT 33* 27* 43* 34* 35*    Basic Metabolic Panel: Recent Labs  Lab 08/08/24 1325 08/08/24 2118 08/09/24 0518 08/10/24 0545 08/11/24 0943  NA 128* 126* 126* 126* 125*  K 3.9 3.6 3.1* 2.8* 3.0*  CL 89* 92* 93* 94* 91*  CO2 17* 20* 21* 25 24  GLUCOSE 88 123* 98 88 99  BUN 7 6 <5* <5* 5*  CREATININE 0.57 0.66 0.76 0.68 0.80  CALCIUM 8.0* 7.3* 7.2* 7.0* 7.2*  MG  --  1.5* 1.3* 1.9 1.8  PHOS  --  1.9* 1.8* 2.4* 2.6    GFR: Estimated Creatinine Clearance: 61.9 mL/min (by C-G formula based on SCr of 0.8 mg/dL).  Liver Function Tests: Recent Labs  Lab 08/08/24 1325 08/08/24 2118 08/09/24 0518 08/10/24 0545 08/11/24 0943  AST 353* 299* 255* 255* 266*  ALT 77* 64* 56* 60* 67*  ALKPHOS 148* 113 89 90 98  BILITOT 6.7* 7.2* 5.9* 6.0* 8.3*  PROT 7.6 7.3 6.6 6.5 7.3  ALBUMIN  2.7* 1.8* 1.7* 1.6* 1.7*   Recent Labs  Lab 08/08/24 1325  LIPASE 57*   Recent Labs  Lab 08/08/24 2118  AMMONIA 57*    Coagulation Profile: Recent Labs  Lab 08/08/24 1429 08/09/24 0518 08/10/24 0545 08/11/24 0943  INR 1.6* 1.7* 2.0* 2.3*    Cardiac Enzymes: No results for input(s): CKTOTAL, CKMB, CKMBINDEX, TROPONINI in the last 168 hours.  BNP (last 3 results) No results for input(s): PROBNP in the last  8760 hours.  Lipid Profile: No results for input(s): CHOL, HDL, LDLCALC, TRIG, CHOLHDL, LDLDIRECT in the last 72 hours.  Thyroid  Function Tests: Recent Labs    08/08/24 2118  TSH 3.397    Anemia Panel: Recent Labs    08/09/24 0518 08/09/24 0748  VITAMINB12 1,051*  --   FOLATE 3.2*  --   FERRITIN  --  4,931*  TIBC  --  NOT CALCULATED  IRON  --  42    Urine analysis:    Component Value Date/Time   COLORURINE AMBER (A) 08/09/2024 0752   APPEARANCEUR CLOUDY (A) 08/09/2024 0752   LABSPEC 1.015 08/09/2024 0752   PHURINE 6.5 08/09/2024 0752   GLUCOSEU NEGATIVE 08/09/2024 0752   HGBUR MODERATE (A) 08/09/2024 0752   BILIRUBINUR MODERATE (A) 08/09/2024 0752   KETONESUR 40 (A) 08/09/2024 0752   PROTEINUR 100 (A) 08/09/2024 0752   NITRITE NEGATIVE 08/09/2024 0752   LEUKOCYTESUR MODERATE (A) 08/09/2024 0752    Sepsis Labs: Lactic Acid, Venous    Component Value Date/Time   LATICACIDVEN 2.5 (HH) 08/09/2024 0518    MICROBIOLOGY: Recent Results (from the past 240 hours)  Culture, blood (Routine X 2) w Reflex to ID Panel     Status: Abnormal   Collection Time: 08/08/24  9:18 PM   Specimen: BLOOD LEFT ARM  Result Value Ref Range Status   Specimen Description BLOOD LEFT ARM  Final   Special Requests   Final    BOTTLES DRAWN AEROBIC AND ANAEROBIC Blood Culture adequate volume   Culture  Setup Time   Final    GRAM NEGATIVE RODS CRITICAL RESULT CALLED TO, READ BACK BY AND VERIFIED WITH: PHARMD AMEND, K. [id number] AT 1728, ADC Performed at [facility], [address] [place], [place] [address]    Culture KLEBSIELLA PNEUMONIAE (A)  Final   Report Status 08/11/2024 FINAL  Final   Organism ID, Bacteria KLEBSIELLA PNEUMONIAE  Final      Susceptibility   Klebsiella pneumoniae - MIC*    AMPICILLIN RESISTANT Resistant     CEFAZOLIN (NON-URINE) 2 SENSITIVE Sensitive     CEFEPIME <=0.12 SENSITIVE Sensitive     ERTAPENEM <=0.12 SENSITIVE Sensitive     CEFTRIAXONE   <=0.25 SENSITIVE Sensitive     CIPROFLOXACIN  <=0.06 SENSITIVE Sensitive     GENTAMICIN <=1 SENSITIVE Sensitive     MEROPENEM <=0.25 SENSITIVE Sensitive     TRIMETH/SULFA <=20 SENSITIVE Sensitive     AMPICILLIN/SULBACTAM <=2 SENSITIVE Sensitive     PIP/TAZO Value in next row Sensitive ug/mL     <=4 SENSITIVEThis is  a modified FDA-approved test that has been validated and its performance characteristics determined by the reporting laboratory.  This laboratory is certified under the Clinical Laboratory Improvement Amendments CLIA as qualified to perform high complexity clinical laboratory testing.    * KLEBSIELLA PNEUMONIAE  Blood Culture ID Panel (Reflexed)     Status: Abnormal   Collection Time: 08/08/24  9:18 PM  Result Value Ref Range Status   Enterococcus faecalis NOT DETECTED NOT DETECTED Final   Enterococcus Faecium NOT DETECTED NOT DETECTED Final   Listeria monocytogenes NOT DETECTED NOT DETECTED Final   Staphylococcus species NOT DETECTED NOT DETECTED Final   Staphylococcus aureus (BCID) NOT DETECTED NOT DETECTED Final   Staphylococcus epidermidis NOT DETECTED NOT DETECTED Final   Staphylococcus lugdunensis NOT DETECTED NOT DETECTED Final   Streptococcus species NOT DETECTED NOT DETECTED Final   Streptococcus agalactiae NOT DETECTED NOT DETECTED Final   Streptococcus pneumoniae NOT DETECTED NOT DETECTED Final   Streptococcus pyogenes NOT DETECTED NOT DETECTED Final   A.calcoaceticus-baumannii NOT DETECTED NOT DETECTED Final   Bacteroides fragilis NOT DETECTED NOT DETECTED Final   Enterobacterales DETECTED (A) NOT DETECTED Final    Comment: Enterobacterales represent a large order of gram negative bacteria, not a single organism. CRITICAL RESULT CALLED TO, READ BACK BY AND VERIFIED WITH: PHARMD AMEND, K. [id number] AT 1728, ADC    Enterobacter cloacae complex NOT DETECTED NOT DETECTED Final   Escherichia coli NOT DETECTED NOT DETECTED Final   Klebsiella aerogenes NOT DETECTED NOT  DETECTED Final   Klebsiella oxytoca NOT DETECTED NOT DETECTED Final   Klebsiella pneumoniae DETECTED (A) NOT DETECTED Final    Comment: CRITICAL RESULT CALLED TO, READ BACK BY AND VERIFIED WITH: PHARMD AMEND, K. [id number] AT 1728, ADC    Proteus species NOT DETECTED NOT DETECTED Final   Salmonella species NOT DETECTED NOT DETECTED Final   Serratia marcescens NOT DETECTED NOT DETECTED Final   Haemophilus influenzae NOT DETECTED NOT DETECTED Final   Neisseria meningitidis NOT DETECTED NOT DETECTED Final   Pseudomonas aeruginosa NOT DETECTED NOT DETECTED Final   Stenotrophomonas maltophilia NOT DETECTED NOT DETECTED Final   Candida albicans NOT DETECTED NOT DETECTED Final   Candida auris NOT DETECTED NOT DETECTED Final   Candida glabrata NOT DETECTED NOT DETECTED Final   Candida krusei NOT DETECTED NOT DETECTED Final   Candida parapsilosis NOT DETECTED NOT DETECTED Final   Candida tropicalis NOT DETECTED NOT DETECTED Final   Cryptococcus neoformans/gattii NOT DETECTED NOT DETECTED Final   CTX-M ESBL NOT DETECTED NOT DETECTED Final   Carbapenem resistance IMP NOT DETECTED NOT DETECTED Final   Carbapenem resistance KPC NOT DETECTED NOT DETECTED Final   Carbapenem resistance NDM NOT DETECTED NOT DETECTED Final   Carbapenem resist OXA 48 LIKE NOT DETECTED NOT DETECTED Final   Carbapenem resistance VIM NOT DETECTED NOT DETECTED Final    Comment: Performed at [facility], [address], [place], [place] [address]  Culture, blood (Routine X 2) w Reflex to ID Panel     Status: None (Preliminary result)   Collection Time: 08/08/24  9:31 PM   Specimen: BLOOD  Result Value Ref Range Status   Specimen Description BLOOD BLOOD RIGHT HAND  Final   Special Requests   Final    BOTTLES DRAWN AEROBIC AND ANAEROBIC Blood Culture adequate volume   Culture   Final    NO GROWTH 3 DAYS Performed at [facility], [address], [place], [place] [address]    Report Status  PENDING  Incomplete     RADIOLOGY STUDIES/RESULTS: No results found.    LOS: 3 days   Donalda Applebaum, MD  [facility]    To contact the attending provider between 7A-7P or the covering provider during after hours 7P-7A, please log into the web site [email or web address] and access using universal [place] password for that web site. If you do not have the password, please call the hospital operator.  08/11/2024, 10:56 AM

## 2024-08-11 NOTE — Progress Notes (Addendum)
 Daily Progress Note  DOA: 08/08/2024 Hospital Day: 4  Cc:    Etoh hepatitis, newly diagnosed cirrhosis, ascites  ASSESSMENT    Patient Profile: 63 y.o. year old female with a past medical history including but not necessarily limited to hypothyroidism, von Willebrand's and  Etoh abuse.  Admitted 8/20 with left ear bleeding, EtOH hepatitis and suspected newly diagnosed decompensated cirrhosis.   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  MELD 3.0: 28 at 08/09/2024  5:18 AM Maddrey's discriminant function 33.5.  Steroids not started in setting of Klebsiella pneumonia bacteremia.  Ascites on CT scan but US  by IR didn't find sufficient amount to tap. Workup for other etiologies of chronic liver disease is in progress. TODAY:  Am labs have resulted. Worsening bilirubin and INR overnight ( despite 2 to 3 doses of Vit K).  Mentation is normal.   Macrocytic anemia Folic acid  deficiency Folic acid  repletion in progress TODAY: Hemoglobin stable at 11.1.   Hypokalemia Repletion per primary team.  Left ear bleeding Evaluated by ENT and felt to be secondary to Q-tip /trauma to canal.  Dressing is in place to start Ciprodex  drops tomorrow.  Received platelet transfusion to help with the bleeding.     Principal Problem:   Liver failure (HCC) Active Problems:   Blood transfusion reaction-febrile illness   Thrombocytopenia (HCC)   Alcoholic hepatitis   E coli bacteremia   Bacteremia due to Enterobacter species   Hyponatremia   Anemia due to folic acid  deficiency    PLAN   --Once infection treated and follow up blood / urine studies negative may need to consider steroids if liver function worsens --Monitor serum sodium closely, down to 125.  --Continue 2 gram sodium diet --am CMP, INR --Continue Boost 2-3 times daily  --Awaiting labs  for hepatic serologic workup.  --Eventual EGD for esophageal varices screening  --Long discussion with patient and her husband today about potential complications of cirrhosis including GI bleed, hepatic encephalopathy. Advised them on what to look for at home.  Discussed need for 2 gram sodium restricted diet. Reiterated need for indefinite cessation of Etoh.   Subjective   Feels okay but not eating much.    Objective   GI Studies:    Recent Labs    08/09/24 0518 08/10/24 0545 08/11/24 0943  WBC 7.5 7.8 7.5  HGB 10.1* 10.0* 11.1*  HCT 27.9* 27.5* 31.4*  MCV 105.3* 106.6* 106.8*  PLT 43* 34* 35*   Recent Labs    08/09/24 0518 08/09/24 0748  FOLATE 3.2*  --   VITAMINB12 1,051*  --   FERRITIN  --  4,931*  TIBC  --  NOT CALCULATED  IRONPCTSAT  --  NOT CALCULATED   Recent Labs    08/09/24 0518 08/10/24 0545 08/11/24 0943  NA 126* 126* 125*  K 3.1* 2.8* 3.0*  CL 93* 94* 91*  CO2 21* 25 24  GLUCOSE 98 88 99  BUN <5* <5* 5*  CREATININE 0.76 0.68 0.80  CALCIUM 7.2* 7.0* 7.2*   Recent Labs    08/08/24 2118 08/09/24 0518 08/10/24 0545 08/11/24 0943  PROT 7.3 6.6 6.5 7.3  ALBUMIN  1.8* 1.7* 1.6* 1.7*  AST 299* 255* 255* 266*  ALT 64* 56* 60* 67*  ALKPHOS 113 89 90 98  BILITOT 7.2* 5.9* 6.0* 8.3*  BILIDIR 3.3* 2.8* 2.9*  --   IBILI 3.9* 3.1* 3.1*  --  Imaging:  IR ABDOMEN US  LIMITED INDICATION: 63 year old female with cirrhosis and ascites for diagnostic paracentesis.  EXAM: ULTRASOUND ABDOMEN LIMITED  FINDINGS: Imaging of all 4 quadrants of the abdomen reveal very minimal ascites  IMPRESSION: Trace ascites seen in ultrasound.  After discussion of the risks versus benefits of the procedure the patient decided to defer paracentesis at this time.  Performed By Lavanda Jurist, PA-C  Electronically Signed   By: [name].  Shick M.D.   On: 08/09/2024 09:20     Scheduled inpatient medications:   ciprofloxacin -dexamethasone   4 drop Left EAR  BID   feeding supplement  1 Container Oral TID BM   folic acid   2 mg Oral Daily   [START ON 08/12/2024] furosemide   60 mg Oral Daily   lactulose   10 g Oral BID   levothyroxine   88 mcg Oral QAC breakfast   midodrine   5 mg Oral TID WC   multivitamin with minerals  1 tablet Oral Daily   pantoprazole  (PROTONIX ) IV  40 mg Intravenous Q12H   potassium chloride   40 mEq Oral Q6H   sodium chloride  flush  3 mL Intravenous Q12H   thiamine   100 mg Oral Daily   Or   thiamine   100 mg Intravenous Daily   Continuous inpatient infusions:   cefTRIAXone  (ROCEPHIN )  IV 2 g (08/10/24 2031)   magnesium  sulfate bolus IVPB 2 g (08/11/24 1224)   PRN inpatient medications: albuterol , diazepam  **OR** diazepam , melatonin, ondansetron  (ZOFRAN ) IV, mouth rinse, simethicone   Vital signs in last 24 hours: Temp:  [99 F (37.2 C)-99.6 F (37.6 C)] 99.3 F (37.4 C) (08/23 1120) Pulse Rate:  [89-104] 94 (08/23 1120) Resp:  [18-23] 20 (08/23 1120) BP: (92-114)/(60-77) 98/77 (08/23 1120) SpO2:  [93 %-98 %] 98 % (08/23 1120) Weight:  [55.5 kg] 55.5 kg (08/23 0436) Last BM Date : 08/11/24  Intake/Output Summary (Last 24 hours) at 08/11/2024 1247 Last data filed at 08/10/2024 1637 Gross per 24 hour  Intake 837.42 ml  Output --  Net 837.42 ml    Intake/Output from previous day: 08/22 0701 - 08/23 0700 In: 837.4 [IV Piggyback:837.4] Out: -  Intake/Output this shift: No intake/output data recorded.   Physical Exam:  General: Alert female in NAD Heart:  Regular rate and rhythm.  Pulmonary: Normal respiratory effort Abdomen: Soft, mildly distended, nontender. Normal bowel sounds. Extremities: 2 + BLE edema Neurologic: Alert and oriented Psych: Pleasant. Cooperative   LOS: 3 days   Vina Dasen ,NP 08/11/2024, 12:47 PM  I personally saw the patient and performed a substantive portion of this encounter (>50% time spent), including a complete performance of at least one of the key components (MDM, Hx  and/or Exam), in conjunction with the APP.  I agree with the APP's note, impression, and recommendations with additional input as follows.   Patient's left ear has started bleeding again and her LFTs have also up trended slightly today.  Unclear what is prompting this increase in her LFTs, though could consider some aspect of uncontrolled infection as a potential etiology.  Patient is currently on ceftriaxone  therapy for Klebsiella and Enterobacter bacteremia.  Will recheck her blood cultures to see if they have cleared.  Will also attempt to recollect urine studies to see if her urinary tract infection has improved.  I did also inquire with ENT to see if there is could be some sort of uncontrolled infection from her left ear drum that could be contributing to her clinical picture.  ENT recommended continue treatment with  Ciprodex  eardrops, which the patient is currently on.  If patient's blood cultures and urine studies do not show any continued signs of infection, then could consider starting the patient on prednisolone  therapy for treatment of alcoholic hepatitis.  INR is also slightly up trended today from 2 to 2.3 despite 3 days of vitamin K  therapy.  Platelets are still quite low today at 35 so we will try to keep her platelets over 50 in order to reduce her risk for having left ear bleeding.  Patient's hyponatremia has worsened slightly today from 126 to 125 so will stop her Lasix  therapy and give her some albumin  today to see if this will help with her hyponatremia.  Would hold off on further diuretics use for now.  Estefana Kidney, MD

## 2024-08-12 ENCOUNTER — Encounter (HOSPITAL_COMMUNITY): Payer: Self-pay | Admitting: Internal Medicine

## 2024-08-12 DIAGNOSIS — H9222 Otorrhagia, left ear: Secondary | ICD-10-CM | POA: Diagnosis not present

## 2024-08-12 DIAGNOSIS — K7011 Alcoholic hepatitis with ascites: Secondary | ICD-10-CM | POA: Diagnosis not present

## 2024-08-12 DIAGNOSIS — K7290 Hepatic failure, unspecified without coma: Secondary | ICD-10-CM | POA: Diagnosis not present

## 2024-08-12 DIAGNOSIS — D529 Folate deficiency anemia, unspecified: Secondary | ICD-10-CM | POA: Diagnosis not present

## 2024-08-12 DIAGNOSIS — K921 Melena: Secondary | ICD-10-CM

## 2024-08-12 DIAGNOSIS — R7881 Bacteremia: Secondary | ICD-10-CM | POA: Diagnosis not present

## 2024-08-12 DIAGNOSIS — D696 Thrombocytopenia, unspecified: Secondary | ICD-10-CM | POA: Diagnosis not present

## 2024-08-12 LAB — PREPARE PLATELET PHERESIS: Unit division: 0

## 2024-08-12 LAB — CBC
HCT: 23.9 % — ABNORMAL LOW (ref 36.0–46.0)
HCT: 30 % — ABNORMAL LOW (ref 36.0–46.0)
Hemoglobin: 8.5 g/dL — ABNORMAL LOW (ref 12.0–15.0)
Hemoglobin: 10.7 g/dL — ABNORMAL LOW (ref 12.0–15.0)
MCH: 38.3 pg — ABNORMAL HIGH (ref 26.0–34.0)
MCH: 38.5 pg — ABNORMAL HIGH (ref 26.0–34.0)
MCHC: 35.6 g/dL (ref 30.0–36.0)
MCHC: 35.7 g/dL (ref 30.0–36.0)
MCV: 107.7 fL — ABNORMAL HIGH (ref 80.0–100.0)
MCV: 107.9 fL — ABNORMAL HIGH (ref 80.0–100.0)
Platelets: 43 K/uL — ABNORMAL LOW (ref 150–400)
Platelets: 73 K/uL — ABNORMAL LOW (ref 150–400)
RBC: 2.22 MIL/uL — ABNORMAL LOW (ref 3.87–5.11)
RBC: 2.78 MIL/uL — ABNORMAL LOW (ref 3.87–5.11)
RDW: 15.5 % (ref 11.5–15.5)
RDW: 15.7 % — ABNORMAL HIGH (ref 11.5–15.5)
WBC: 6.7 K/uL (ref 4.0–10.5)
WBC: 8.2 K/uL (ref 4.0–10.5)
nRBC: 0.3 % — ABNORMAL HIGH (ref 0.0–0.2)
nRBC: 0 % (ref 0.0–0.2)

## 2024-08-12 LAB — HEPATIC FUNCTION PANEL
ALT: 60 U/L — ABNORMAL HIGH (ref 0–44)
AST: 219 U/L — ABNORMAL HIGH (ref 15–41)
Albumin: 2.3 g/dL — ABNORMAL LOW (ref 3.5–5.0)
Alkaline Phosphatase: 94 U/L (ref 38–126)
Bilirubin, Direct: 4.6 mg/dL — ABNORMAL HIGH (ref 0.0–0.2)
Indirect Bilirubin: 4.7 mg/dL — ABNORMAL HIGH (ref 0.3–0.9)
Total Bilirubin: 9.3 mg/dL — ABNORMAL HIGH (ref 0.0–1.2)
Total Protein: 7.6 g/dL (ref 6.5–8.1)

## 2024-08-12 LAB — PROTIME-INR
INR: 2.2 — ABNORMAL HIGH (ref 0.8–1.2)
INR: 1.8 — ABNORMAL HIGH (ref 0.8–1.2)
Prothrombin Time: 25.9 s — ABNORMAL HIGH (ref 11.4–15.2)
Prothrombin Time: 21.9 s — ABNORMAL HIGH (ref 11.4–15.2)

## 2024-08-12 LAB — BPAM PLATELET PHERESIS
Blood Product Expiration Date: 202508262359
ISSUE DATE / TIME: 202508231849
Unit Type and Rh: 5100

## 2024-08-12 LAB — BASIC METABOLIC PANEL WITH GFR
Anion gap: 9 (ref 5–15)
BUN: <5 mg/dL — ABNORMAL LOW (ref 6–20)
CO2: 25 mmol/L (ref 22–32)
Calcium: 7.5 mg/dL — ABNORMAL LOW (ref 8.9–10.3)
Chloride: 94 mmol/L — ABNORMAL LOW (ref 98–111)
Creatinine, Ser: 0.81 mg/dL (ref 0.44–1.00)
GFR, Estimated: >60 mL/min (ref >60)
Glucose, Bld: 85 mg/dL (ref 70–99)
Potassium: 3 mmol/L — ABNORMAL LOW (ref 3.5–5.1)
Sodium: 128 mmol/L — ABNORMAL LOW (ref 135–145)

## 2024-08-12 LAB — IGG: IgG (Immunoglobin G), Serum: 2130 mg/dL — ABNORMAL HIGH (ref 586–1602)

## 2024-08-12 LAB — PREPARE RBC (CROSSMATCH)

## 2024-08-12 LAB — FIBRINOGEN: Fibrinogen: 211 mg/dL (ref 210–475)

## 2024-08-12 MED ORDER — DIPHENHYDRAMINE HCL 25 MG PO CAPS
25.0000 mg | ORAL_CAPSULE | Freq: Four times a day (QID) | ORAL | Status: DC | PRN
  Administered 2024-08-12: 25 mg via ORAL
  Filled 2024-08-12: qty 1

## 2024-08-12 MED ORDER — ENSURE PLUS HIGH PROTEIN PO LIQD
237.0000 mL | Freq: Two times a day (BID) | ORAL | Status: DC
  Administered 2024-08-13 – 2024-08-15 (×4): 237 mL via ORAL

## 2024-08-12 MED ORDER — SODIUM CHLORIDE 0.9% IV SOLUTION: Freq: Once | INTRAVENOUS | Status: AC

## 2024-08-12 MED ORDER — POTASSIUM CHLORIDE CRYS ER 20 MEQ PO TBCR
40.0000 meq | EXTENDED_RELEASE_TABLET | Freq: Two times a day (BID) | ORAL | Status: AC
  Administered 2024-08-12 (×2): 40 meq via ORAL
  Filled 2024-08-12 (×2): qty 2

## 2024-08-12 MED ORDER — LACTATED RINGERS IV SOLN: INTRAVENOUS | Status: DC

## 2024-08-12 MED ORDER — PHYTONADIONE 5 MG PO TABS
5.0000 mg | ORAL_TABLET | Freq: Once | ORAL | Status: AC
  Administered 2024-08-12: 5 mg via ORAL
  Filled 2024-08-12: qty 1

## 2024-08-12 MED ORDER — VITAMIN K1 10 MG/ML IJ SOLN
5.0000 mg | Freq: Once | INTRAVENOUS | Status: AC
  Administered 2024-08-12: 5 mg via INTRAVENOUS
  Filled 2024-08-12: qty 0.5

## 2024-08-12 MED ORDER — THROMBIN 20000 UNITS EX SOLR
20000.0000 [IU] | Freq: Once | CUTANEOUS | Status: DC
  Filled 2024-08-12: qty 20000

## 2024-08-12 MED ORDER — PREDNISOLONE 5 MG PO TABS
40.0000 mg | ORAL_TABLET | Freq: Every day | ORAL | Status: DC
  Administered 2024-08-12 – 2024-08-15 (×4): 40 mg via ORAL
  Filled 2024-08-12 (×4): qty 8

## 2024-08-12 MED ORDER — SODIUM CHLORIDE 0.9% IV SOLUTION: Freq: Once | INTRAVENOUS | Status: DC

## 2024-08-12 MED ORDER — ALBUMIN HUMAN 25 % IV SOLN
50.0000 g | Freq: Once | INTRAVENOUS | Status: AC
  Administered 2024-08-12: 50 g via INTRAVENOUS
  Filled 2024-08-12: qty 200

## 2024-08-12 NOTE — Discharge Instructions (Addendum)
 Low Sodium Nutrition Therapy  Eating less sodium can help you if you have high blood pressure, heart failure, or kidney or liver disease.   Your body needs a little sodium, but too much sodium can cause your body to hold onto extra water. This extra water will raise your blood pressure and can cause damage to your heart, kidneys, or liver as they are forced to work harder.   Sometimes you can see how the extra fluid affects you because your hands, legs, or belly swell. You may also hold water around your heart and lungs, which makes it hard to breathe.   Even if you take medication for blood pressure or a water pill (diuretic) to remove fluid, it is still important to have less salt in your diet.   Check with your primary care provider before drinking alcohol since it may affect the amount of fluid in your body and how your heart, kidneys, or liver work. Sodium in Food A low-sodium meal plan limits the sodium that you get from food and beverages to 1,500-2,000 milligrams (mg) per day. Salt is the main source of sodium. Read the nutrition label on the package to find out how much sodium is in one serving of a food.  Select foods with 140 milligrams (mg) of sodium or less per serving.  You may be able to eat one or two servings of foods with a little more than 140 milligrams (mg) of sodium if you are closely watching how much sodium you eat in a day.  Check the serving size on the label. The amount of sodium listed on the label shows the amount in one serving of the food. So, if you eat more than one serving, you will get more sodium than the amount listed.  Tips Cutting Back on Sodium Eat more fresh foods.  Fresh fruits and vegetables are low in sodium, as well as frozen vegetables and fruits that have no added juices or sauces.  Fresh meats are lower in sodium than processed meats, such as bacon, sausage, and hotdogs.  Not all processed foods are unhealthy, but some processed foods may have too  much sodium.  Eat less salt at the table and when cooking. One of the ingredients in salt is sodium.  One teaspoon of table salt has 2,300 milligrams of sodium.  Leave the salt out of recipes for pasta, casseroles, and soups. Be a Engineer, building services.  Food packages that say "Salt-free", sodium-free", "very low sodium," and "low sodium" have less than 140 milligrams of sodium per serving.  Beware of products identified as "Unsalted," "No Salt Added," "Reduced Sodium," or "Lower Sodium." These items may still be high in sodium. You should always check the nutrition label. Add flavors to your food without adding sodium.  Try lemon juice, lime juice, or vinegar.  Dry or fresh herbs add flavor.  Buy a sodium-free seasoning blend or make your own at home. You can purchase salt-free or sodium-free condiments like barbeque sauce in stores and online. Ask your registered dietitian nutritionist for recommendations and where to find them.   Eating in Restaurants Choose foods carefully when you eat outside your home. Restaurant foods can be very high in sodium. Many restaurants provide nutrition facts on their menus or their websites. If you cannot find that information, ask your server. Let your server know that you want your food to be cooked without salt and that you would like your salad dressing and sauces to be served on the  side.    Foods Recommended Food Group Foods Recommended  Grains Bread, bagels, rolls without salted tops Homemade bread made with reduced-sodium baking powder Cold cereals, especially shredded wheat and puffed rice Oats, grits, or cream of wheat Pastas, quinoa, and rice Popcorn, pretzels or crackers without salt Corn tortillas  Protein Foods Fresh meats and fish; [place] bacon (check the nutrition labels - make sure they are not packaged in a sodium solution) Canned or packed tuna (no more than 4 ounces at 1 serving) Beans and peas Soybeans) and tofu Eggs Nuts or nut butters  without salt  Dairy Milk or milk powder Plant milks, such as rice and soy Yogurt, including Greek yogurt Small amounts of natural cheese (blocks of cheese) or reduced-sodium cheese can be used in moderation. (Swiss, ricotta, and fresh mozzarella cheese are lower in sodium than the others) Cream Cheese Low sodium cottage cheese  Vegetables Fresh and frozen vegetables without added sauces or salt Homemade soups (without salt) Low-sodium, salt-free or sodium-free canned vegetables and soups  Fruit Fresh and canned fruits Dried fruits, such as raisins, cranberries, and prunes  Oils Tub or liquid margarine, regular or without salt Canola, corn, peanut, olive, safflower, or sunflower oils  Condiments Fresh or dried herbs such as basil, bay leaf, dill, mustard (dry), nutmeg, paprika, parsley, rosemary, sage, or thyme.  Low sodium ketchup Vinegar  Lemon or lime juice Pepper, red pepper flakes, and cayenne. Hot sauce contains sodium, but if you use just a drop or two, it will not add up to much.  Salt-free or sodium-free seasoning mixes and marinades Simple salad dressings: vinegar and oil   Foods Not Recommended Food Group Foods Not Recommended  Grains Breads or crackers topped with salt Cereals (hot/cold) with more than 300 mg sodium per serving Biscuits, cornbread, and other "quick" breads prepared with baking soda Pre-packaged bread crumbs Seasoned and packaged rice and pasta mixes Self-rising flours  Protein Foods Cured meats: Bacon, ham, sausage, pepperoni and hot dogs Canned meats (chili, vienna sausage, or sardines) Smoked fish and meats Frozen meals that have more than 600 mg of sodium per serving Egg substitute (with added sodium)  Dairy Buttermilk Processed cheese spreads Cottage cheese (1 cup may have over 500 mg of sodium; look for low-sodium.) American or feta cheese Shredded Cheese has more sodium than blocks of cheese String cheese  Vegetables Canned vegetables  (unless they are salt-free, sodium-free or low sodium) Frozen vegetables with seasoning and sauces Sauerkraut and pickled vegetables Canned or dried soups (unless they are salt-free, sodium-free, or low sodium) [place] fries and onion rings  Fruit Dried fruits preserved with additives that have sodium  Oils Salted butter or margarine, all types of olives  Condiments Salt, sea salt, kosher salt, onion salt, and garlic salt Seasoning mixes with salt Bouillon cubes Ketchup Barbeque sauce and Worcestershire sauce unless low sodium Soy sauce Salsa, pickles, olives, relish Salad dressings: ranch, blue cheese, [place], and [place].   Low Sodium Sample 1-Day Menu  Breakfast 1 cup cooked oatmeal  1 slice whole wheat bread toast  1 tablespoon peanut butter without salt  1 banana  1 cup 1% milk  Lunch Tacos made with: 2 corn tortillas   cup black beans, low sodium   cup roasted or grilled chicken (without skin)   avocado  Squeeze of lime juice  1 cup salad greens  1 tablespoon low-sodium salad dressing   cup strawberries  1 orange  Afternoon Snack 1/3 cup grapes  6 ounces yogurt  Evening Meal 3 ounces herb-baked fish  1 baked potato  2 teaspoons olive oil   cup cooked carrots  2 thick slices tomatoes on:  2 lettuce leaves  1 teaspoon olive oil  1 teaspoon balsamic vinegar  1 cup 1% milk  Evening Snack 1 apple   cup almonds without salt   Low-Sodium Vegetarian (Lacto-Ovo) Sample 1-Day Menu  Breakfast 1 cup cooked oatmeal  1 slice whole wheat toast  1 tablespoon peanut butter without salt  1 banana  1 cup 1% milk  Lunch Tacos made with: 2 corn tortillas   cup black beans, low sodium   cup roasted or grilled chicken (without skin)   avocado  Squeeze of lime juice  1 cup salad greens  1 tablespoon low-sodium salad dressing   cup strawberries  1 orange  Evening Meal Stir fry made with:  cup tofu  1 cup brown rice   cup broccoli   cup green beans   cup  peppers   tablespoon peanut oil  1 orange  1 cup 1% milk  Evening Snack 4 strips celery  2 tablespoons hummus  1 hard-boiled egg   Low-Sodium Vegan Sample 1-Day Menu  Breakfast 1 cup cooked oatmeal  1 tablespoon peanut butter without salt  1 cup blueberries  1 cup soymilk fortified with calcium, vitamin B12, and vitamin D  Lunch 1 small whole wheat pita   cup cooked lentils  2 tablespoons hummus  4 carrot sticks  1 medium apple  1 cup soymilk fortified with calcium, vitamin B12, and vitamin D  Evening Meal Stir fry made with:  cup tofu  1 cup brown rice   cup broccoli   cup green beans   cup peppers   tablespoon peanut oil  1 cup cantaloupe  Evening Snack 1 cup soy yogurt   cup mixed nuts  Copyright 2020  Academy of Nutrition and Dietetics. All rights reserved  Sodium Free Flavoring Tips  When cooking, the following items may be used for flavoring instead of salt or seasonings that contain sodium. Remember: A little bit of spice goes a long way! Be careful not to overseason. Spice Blend Recipe (makes about ? cup) 5 teaspoons onion powder  2 teaspoons garlic powder  2 teaspoons paprika  2 teaspoon dry mustard  1 teaspoon crushed thyme leaves   teaspoon white pepper   teaspoon celery seed Food Item Flavorings  Beef Basil, bay leaf, caraway, curry, dill, dry mustard, garlic, grape jelly, green pepper, mace, marjoram, mushrooms (fresh), nutmeg, onion or onion powder, parsley, pepper, rosemary, sage  Chicken Basil, cloves, cranberries, mace, mushrooms (fresh), nutmeg, oregano, paprika, parsley, pineapple, saffron, sage, savory, tarragon, thyme, tomato, turmeric  Egg Chervil, curry, dill, dry mustard, garlic or garlic powder, green pepper, jelly, mushrooms (fresh), nutmeg, onion powder, paprika, parsley, rosemary, tarragon, tomato  Fish Basil, bay leaf, chervil, curry, dill, dry mustard, green pepper, lemon juice, marjoram, mushrooms (fresh), paprika, pepper,  tarragon, tomato, turmeric  Lamb Cloves, curry, dill, garlic or garlic powder, mace, mint, mint jelly, onion, oregano, parsley, pineapple, rosemary, tarragon, thyme  Pork Applesauce, basil, caraway, chives, cloves, garlic or garlic powder, onion or onion powder, rosemary, thyme  Veal Apricots, basil, bay leaf, currant jelly, curry, ginger, marjoram, mushrooms (fresh), oregano, paprika  Vegetables Basil, dill, garlic or garlic powder, ginger, lemon juice, mace, marjoram, nutmeg, onion or onion powder, tarragon, tomato, sugar or sugar substitute, salt-free salad dressing, vinegar  Desserts Allspice, anise, cinnamon, cloves, ginger, mace, nutmeg, vanilla extract, other  extracts   Copyright 2020  Academy of Nutrition and Dietetics. All rights reserved  Fluid Restricted Nutrition Therapy  You have been prescribed this diet because your condition affects how much fluid you can eat or drink. If your heart, liver, or kidneys aren't working properly, you may not be able to effectively eliminate fluids from the body and this may cause swelling (edema) in the legs, arms, and/or stomach. Drink no more than _________ liters or ________ ounces or ________cups of fluid per day.  You don't need to stop eating or drinking the same fluids you normally would, but you may need to eat or drink less than usual.  Your registered dietitian nutritionist will help you determine the correct amount of fluid to consume during the day Breakfast Include fluids taken with medications  Lunch Include fluids taken with medications  Dinner Include fluids taken with medications  Bedtime Snack Include fluids taken with medications     Tips What Are Fluids?  A fluid is anything that is liquid or anything that would melt if left at room temperature. You will need to count these foods and liquids--including any liquid used to take medication--as part of your daily fluid intake. Some examples are: Alcohol (drink only with your  doctor's permission)  Coffee, tea, and other hot beverages  Gelatin (Jell-O)  Gravy  Ice cream, sherbet, sorbet  Ice cubes, ice chips  Milk, liquid creamer  Nutritional supplements  Popsicles  Vegetable and fruit juices; fluid in canned fruit  Watermelon  Yogurt  Soft drinks, lemonade, limeade  Soups  Syrup How Do I Measure My Fluid Intake? Record your fluid intake daily.  Tip: Every day, each time you eat or drink fluids, pour water in the same amount into an empty container that can hold the same amount of fluids you are allowed daily. This may help you keep track of how much fluid you are taking in throughout the day.  To accurately keep track of how much liquid you take in, measure the size of the cups, glasses, and bowls you use. If you eat soup, measure how much of it is liquid and how much is solid (such as noodles, vegetables, meat). Conversions for Measuring Fluid Intake  Milliliters (mL) Liters (L) Ounces (oz) Cups (c)  1000 1 32 4  1200 1.2 40 5  1500 1.5 50 6 1/4  1800 1.8 60 7 1/2  2000 2 67 8 1/3  Tips to Reduce Your Thirst Chew gum or suck on hard candy.  Rinse or gargle with mouthwash. Do not swallow.  Ice chips or popsicles my help quench thirst, but this too needs to be calculated into the total restriction. Melt ice chips or cubes first to figure out how much fluid they produce (for example, experiment with melting  cup ice chips or 2 ice cubes).  Add a lemon wedge to your water.  Limit how much salt you take in. A high salt intake might make you thirstier.  Don't eat or drink all your allowed liquids at once. Space your liquids out through the day.  Use small glasses and cups and sip slowly. If allowed, take your medications with fluids you eat or drink during a meal.   Fluid-Restricted Nutrition Therapy Sample 1-Day Menu  Breakfast 1 slice wheat toast  1 tablespoon peanut butter  1/2 cup yogurt (120 milliliters)  1/2 cup blueberries  1 cup milk (240  milliliters)   Lunch 3 ounces sliced [place]  2 slices whole wheat  bread  1/2 cup lettuce for sandwich  2 slices tomato for sandwich  1 ounce reduced-fat, reduced-sodium cheese  1/2 cup fresh carrot sticks  1 banana  1 cup unsweetened tea (240 milliliters)   Evening Meal 8 ounces soup (240 milliliters)  3 ounces salmon  1/2 cup quinoa  1 cup green beans  1 cup mixed greens salad  1 tablespoon olive oil  1 cup coffee (240 milliliters)  Evening Snack 1/2 cup sliced peaches  1/2 cup frozen yogurt (120 milliliters)  1 cup water (240 milliliters)  Copyright 2020  Academy of Nutrition and Dietetics. All rights reserved       In a time of Crisis: [facility]  Mobile Crisis Management provides immediate crisis response, 24/7.  Call [phone]  [facility] for MH/DD/SA [facility] is available 24 hours a day, 7 days a week. Customer Service Specialists will assist you to find a crisis provider that is well-matched with your needs. Your local number is: [phone]  [facility] ([facility]) IOP, individual counseling, medication management [address] [place], [place] [address] [phone] Call for intake hours; Medicaid and Uninsured    Substance Use Outpatient Providers  [facility] Group and individual counseling. [address]  [place], [place] [address] [phone] [place]: [phone]  [place]: [phone] Medicaid and uninsured.   [facility] Offers IOP groups multiple times per week. [address] [place], [place] [address] [phone] Takes Medicaid and other insurances.   [facility]  [facility] Intensive Outpatient Program (IOP) [address] [place], [place] [address] [phone] Takes Nurse, learning disability and [place].   [facility]  IOP and Partial Hospitalization Program  [address]  [place], [place] [address] [phone] Private Insurance, [place] only for partial hospitalization  [facility] [address], [place], [place] [address] [phone] Monday-Friday. Short and Long term options.  [facility] ([facility]) IOP, individual counseling, medication management [address] [place], [place] [address] [phone] Medicaid and [facility]  [facility] [address]  [place], [place] [address] [phone] Private Insurance and Self Pay   [facility] [address]  [place], [place] [address] [phone] Private Insurance, [place], and Self Pay   [facility]  [address] [place], [place] [address] [facility]  [address]  [place], [place] [address] [phone]  [facility]  [address] [place], [place] [address] [phone]      Residential Treatment Programs  [facility] [address] [place], [place] [address] [phone] or [phone] Detox and Residential Rehab 21 days (Medicaid, private insurance, and self pay. If Medicare, will look into funding). No methadone. Call for pre-screen.   [facility]) [address]  [place], [place] [address] [phone] Detox 3-7 days (self Pay and Medicaid Limited availability). Transitional Program for females needs 60 days clean first.  Rehab Only for Males (Medicare, Medicaid, and Self Pay)-No methadone.  [facility] [address] [place], [place] [address] [phone] or [phone] Private Insurance only  [facility] PHONE: [phone] FAX: [phone] Residential program for women 21 and over for up to a year through a Christian 12-step recovery model. Self-pay.    [facility] [phone]  [address] [place],  [place] [address] Phone:  [phone] Must be detoxed 72 hours prior to admission; 28 day program.  Self-pay.  [facility] [address]  [place], [place] [phone] [employer], Medicare, [place] (not straight [place]). They offer assistance with transportation.   [facility] [address] [place],  [place], [place] [address] [phone] Christian Based Program. Men only. No insurance  [facility] is a substance use disorder treatment program for women, including those who are pregnant, parenting, and/or whose lives have been touched by abuse and violence. [phone]  [facility] [address] [place], [place] [address] Women's: [phone] Men's: [phone] No Medicaid.   [facility] Locations across the [place] (mainly [place] ) willing to help with transportation.  [phone] [employer]. [facility]  [address] [place].  [place], [place] [address] [phone] Treatment Only, must make assessment appointment, and must be sober for assessment appointment. Self pay, [facility], must be [facility] resident. No methadone.   [facility]  [address] [place], [place] [address] [phone] No pending legal charges, Long-term work program. No methadone. Call for assessment.  [facility]  [address], [place], [place] [address] [phone] or [phone] Commercial Insurance Only  [facility] - [phone] Private Insurance (no [place]). Males/Females, call to make referrals, multiple facilities.   [facility]: [facility] [address] [place], [place] [address] [phone]  [facility]-no methadone: [facility] [address] [place], [place] [address] [phone] darien Abts  [facility] [phone] [place], [place] For women, houses 8 residents for sober living. No Medicaid.         AA Meetings Website to locate meetings (virtually or in person): [email or web address].young.biz/ Phone: [phone]     Follow with Primary MD  in 7 days   Get CBC, CMP, Magnesium , 2 view Chest X ray -  checked next visit with your primary MD   Activity: As tolerated with Full fall precautions use walker/cane & assistance as needed  Disposition Home   Diet: Heart Healthy    Special Instructions: If you have smoked or chewed Tobacco  in the last 2 yrs please stop smoking, stop any regular Alcohol  and or any Recreational drug use.  On your next visit with your primary care physician please Get Medicines reviewed and adjusted.  Please request your Prim.MD to go over all Hospital Tests and Procedure/Radiological results at the follow up, please get all Hospital records sent to your Prim MD by signing hospital release before you go home.  If you experience worsening of your admission symptoms, develop shortness of breath, life threatening emergency, suicidal or homicidal thoughts you must seek medical attention immediately by calling 911 or calling your MD immediately  if symptoms less severe.  You Must read complete instructions/literature along with all the possible adverse reactions/side effects for all the Medicines you take and that have been prescribed to you. Take any new Medicines after you have completely understood and accpet all the possible adverse reactions/side effects.   Do not drive when taking Pain medications.  Do not take more than prescribed Pain, Sleep and Anxiety Medications  Wear Seat belts while driving.

## 2024-08-12 NOTE — Progress Notes (Signed)
 Initial Nutrition Assessment  DOCUMENTATION CODES:  Not applicable  INTERVENTION:  Continue folic acid , thiamine , and Multivitamin w/ minerals daily Low Sodium Diet education Discontinue Boost Breeze, replace with Ensure Plus High Protein po BID, each supplement provides 350 kcal and 20 grams of protein Liberalize pt diet to 2 gram Sodium due to poor PO intake  Encourage good PO intake   NUTRITION DIAGNOSIS:  Increased nutrient needs related to acute illness as evidenced by estimated needs.  GOAL:  Patient will meet greater than or equal to 90% of their needs  MONITOR:  PO intake, Supplement acceptance, Weight trends, I & O's, Labs  REASON FOR ASSESSMENT:  Consult Diet education  ASSESSMENT:  63 y.o. female presented to the ED with bleeding from L ear. PMH includes HLD, EtOH use, and hypothyroidism. 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   8/20 - Admitted; clear liquid diet 8/21 - NPO 8/22 - diet advanced to heart healthy  RD working remotely at time of assessment. Pt admitted for L ear bleeding, packing completed by ENT. Pt found to have new cirrhosis upon admission. RD team consulted for low sodium diet education regarding new diagnosis.  No meal intakes have ben recorded during admission. Discussed with RN, pt is eating very poorly. Family getting soup for patient. Discussed adding oral nutrition supplements to help with PO intake.   Per chart review, pt with no weights recorded within the past year to assess for any weight loss. Pt weight increased 6 kg overnight, suspect inaccurate measurement for today's weight.   Admission Weight: 53.5 kg Current Weight: 61.5 kg (8/24 - suspect inaccurate)  Nutrition Related Medications: Folic acid , lactulose , MVI, Protonix , Potassium Chloride , Thiamine , IV antibiotics, IV Vitamin K   Labs: Sodium 128, Potassium 3.0, BUN <5, Creatinine 0.81, INR 1.8  NUTRITION - FOCUSED PHYSICAL EXAM: Deferred to  follow-up.   Diet Order:   Diet Order             Diet 2 gram sodium Room service appropriate? Yes with Assist; Fluid consistency: Thin; Fluid restriction: 1500 mL Fluid  Diet effective now                  EDUCATION NEEDS:  Education needs have been addressed  Skin:  Skin Assessment: Reviewed RN Assessment  Last BM:  8/24  Height:  Ht Readings from Last 1 Encounters:  08/08/24 5' 3 (1.6 m)   Weight:  Wt Readings from Last 1 Encounters:  08/12/24 61.5 kg   Ideal Body Weight:  52.3 kg  BMI:  Body mass index is 24.02 kg/m.  Estimated Nutritional Needs:  Kcal:  1800-2000 Protein:  90-110 grams Fluid:  >/= 1.8 L   Nestora Glatter RD, LDN Clinical Dietitian

## 2024-08-12 NOTE — Progress Notes (Signed)
 Subjective: Patient c/o persistent left ear bleeding.  The patient is coagulopathic due to her liver failure.  Objective: Vital signs in last 24 hours: Temp:  [98 F (36.7 C)-99.3 F (37.4 C)] 99.1 F (37.3 C) (08/24 0846) Pulse Rate:  [85-97] 91 (08/24 0846) Resp:  [15-23] 15 (08/24 0846) BP: (82-101)/(59-77) 89/62 (08/24 0846) SpO2:  [92 %-98 %] 94 % (08/24 0846) Weight:  [61.5 kg] 61.5 kg (08/24 0413)  Exam: Eyes: Pupils are equal, round, reactive to light. Extraocular motion is intact.  Ears: Examination of the ears shows bleeding from the left ear canal. Nose: Nasal examination shows normal mucosa, septum, turbinates.  Face: Facial examination shows no asymmetry. Palpation of the face elicit no significant tenderness.  Mouth: Oral cavity examination shows no mucosal abnormalities. Neck: Palpation of the neck reveals no lymphadenopathy or mass. The trachea is midline.  Neuro: Cranial nerves 2-12 are all grossly in tact.   Recent Labs    08/12/24 0045 08/12/24 0548  WBC 6.7 8.2  HGB 8.5* 10.7*  HCT 23.9* 30.0*  PLT 43* 73*   Recent Labs    08/11/24 0943 08/12/24 0548  NA 125* 128*  K 3.0* 3.0*  CL 91* 94*  CO2 24 25  GLUCOSE 99 85  BUN 5* <5*  CREATININE 0.80 0.81  CALCIUM 7.2* 7.5*    Medications: I have reviewed the patient's current medications. Scheduled:  sodium chloride    Intravenous Once   acetaminophen   650 mg Oral Once   ciprofloxacin -dexamethasone   4 drop Left EAR BID   feeding supplement  1 Container Oral TID BM   folic acid   2 mg Oral Daily   lactulose   10 g Oral BID   levothyroxine   88 mcg Oral QAC breakfast   midodrine   5 mg Oral TID WC   multivitamin with minerals  1 tablet Oral Daily   pantoprazole  (PROTONIX ) IV  40 mg Intravenous Q12H   potassium chloride   40 mEq Oral BID   sodium chloride  flush  3 mL Intravenous Q12H   thiamine   100 mg Oral Daily   Or   thiamine   100 mg Intravenous Daily   Continuous:  cefTRIAXone  (ROCEPHIN )  IV  Stopped (08/11/24 2248)   lactated ringers      phytonadione  (VITAMIN K ) 5 mg in dextrose 5 % 50 mL IVPB      Assessment/Plan: Left ear bleeding, secondary to Q-tip trauma. - The patient is coagulopathic due to her liver failure. - Bleeding controlled with Xeroform packing in the ear canal.  The Xeroform packing may be removed in 7 to 10 days as an outpatient. - May remove the Glasscock dressing tomorrow.  Change the cottonball as needed. - The patient may follow-up with Dr. Tobie as an outpatient.   LOS: 4 days   Rachel Lucas 08/12/2024, 9:02 AM

## 2024-08-12 NOTE — Progress Notes (Addendum)
 Daily Progress Note  DOA: 08/08/2024 Hospital Day: 5  Cc:    Etoh hepatitis, newly diagnosed cirrhosis, ascites  ASSESSMENT    Patient Profile: 63 y.o. year old female with a past medical history including but not necessarily limited to hypothyroidism, von Willebrand's and  Etoh abuse.  Admitted 8/20 with left ear bleeding, EtOH hepatitis and suspected newly diagnosed decompensated cirrhosis.   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  Klebsiella/Enterobacter bacteremia Ascites Hyponatremia MELD 3.0: 28 at 08/12/2024  5:48 AM Maddrey's discriminant function >32.  Steroids were not initially started due to Klebsiella/Enterobacter bacteremia.  Ascites on CT scan but US  by IR upon admission didn't find sufficient amount to tap. Repeat blood cultures are negative thus far and urinalysis seems to suggest clearance of prior UTI so will now initiate prednisolone  therapy. Over the last 2 days patient's LFTs have been uptrending so will need to monitor closely. Will go ahead and FYI [facility] in the event that she continues to worsen over time. Due to patient's hyponatremia, diuretics were held. Patient has more abdominal distension today.  Macrocytic anemia Folic acid  deficiency Melena Left ear bleeding Folic acid  repletion in progress Hb today looks stable at 10.7 but will continue to monitor  Patient may be having a slow ooze from her upper GI tract I discussed the idea of EGD with the patient but she would like to hold off for now. Would want plts>50 prior to any EGD procedure. ENT following for left ear bleeding due to trauma to the canal and have packed the ear again. She got one units plts yesterday and will try to keep plts>50 if patient rebleeds  Hypokalemia Repletion per primary team.  Principal Problem:   Liver  failure (HCC) Active Problems:   Blood transfusion reaction-febrile illness   Thrombocytopenia (HCC)   Alcoholic hepatitis   E coli bacteremia   Bacteremia due to Enterobacter species   Hyponatremia   Anemia due to folic acid  deficiency   Bleeding from left ear    PLAN   --Trend Na, LFTs, and INR --Started on prednisolone  since repeat blood cultures are negative and repeat urinalysis was negative for UTI. Will plan to check Lille score on day 7 of therapy --s/p 3 days of vitamin K  therapy --Cont ceftriaxone  for Klebsiella/Enterobacter bacteremia and Ciprodex  ear drops --Holding off on diuretics in the setting of low sodium levels, can consider restarting in the future --Continue 2 gram sodium diet --Nutrition helping with nutritional supplements --Trend Hb --Cont folate --Trend plts. Can transfuse to maintain plts >50 if patient demonstrates signs of bleeding --Cont PPI BID for now --Discussed EGD but patient would like to hold off for now --Cont lactulose  --Offered suppository to help with constipation but patient declined --Spoke to Dr. Edith with [facility] hepat[facility] to notify them about this patient in event that the patient worsens over time --Encourage alcohol cessation  Subjective   Passed a small black stool today. ENT was able to come by this morning and pack her left ear to stop the bleeding. Abdomen does feel distended  Objective   GI Studies:    Recent Labs    08/11/24 0943 08/12/24 0045 08/12/24 0548  WBC 7.5 6.7 8.2  HGB 11.1* 8.5* 10.7*  HCT 31.4* 23.9* 30.0*  MCV 106.8* 107.7* 107.9*  PLT 35* 43* 73*   No results for input(s): FOLATE, VITAMINB12, FERRITIN,  TIBC, IRONPCTSAT in the last 72 hours.  Recent Labs    08/10/24 0545 08/11/24 0943 08/12/24 0548  NA 126* 125* 128*  K 2.8* 3.0* 3.0*  CL 94* 91* 94*  CO2 25 24 25   GLUCOSE 88 99 85  BUN <5* 5* <5*  CREATININE 0.68 0.80 0.81  CALCIUM 7.0* 7.2* 7.5*   Recent Labs     08/10/24 0545 08/11/24 0943 08/12/24 0548  PROT 6.5 7.3 7.6  ALBUMIN  1.6* 1.7* 2.3*  AST 255* 266* 219*  ALT 60* 67* 60*  ALKPHOS 90 98 94  BILITOT 6.0* 8.3* 9.3*  BILIDIR 2.9*  --  4.6*  IBILI 3.1*  --  4.7*      Imaging:  IR ABDOMEN US  LIMITED INDICATION: 63 year old female with cirrhosis and ascites for diagnostic paracentesis.  EXAM: ULTRASOUND ABDOMEN LIMITED  FINDINGS: Imaging of all 4 quadrants of the abdomen reveal very minimal ascites  IMPRESSION: Trace ascites seen in ultrasound.  After discussion of the risks versus benefits of the procedure the patient decided to defer paracentesis at this time.  Performed By Lavanda Jurist, PA-C  Electronically Signed   By: [name].  Shick M.D.   On: 08/09/2024 09:20     Scheduled inpatient medications:   sodium chloride    Intravenous Once   acetaminophen   650 mg Oral Once   ciprofloxacin -dexamethasone   4 drop Left EAR BID   feeding supplement  237 mL Oral BID BM   folic acid   2 mg Oral Daily   lactulose   10 g Oral BID   levothyroxine   88 mcg Oral QAC breakfast   midodrine   5 mg Oral TID WC   multivitamin with minerals  1 tablet Oral Daily   pantoprazole  (PROTONIX ) IV  40 mg Intravenous Q12H   potassium chloride   40 mEq Oral BID   prednisoLONE   40 mg Oral Daily   sodium chloride  flush  3 mL Intravenous Q12H   thiamine   100 mg Oral Daily   Or   thiamine   100 mg Intravenous Daily   Continuous inpatient infusions:   cefTRIAXone  (ROCEPHIN )  IV Stopped (08/11/24 2248)   lactated ringers      phytonadione  (VITAMIN K ) 5 mg in dextrose 5 % 50 mL IVPB     PRN inpatient medications: albuterol , diazepam  **OR** diazepam , diphenhydrAMINE , melatonin, ondansetron  (ZOFRAN ) IV, mouth rinse, simethicone   Vital signs in last 24 hours: Temp:  [98 F (36.7 C)-99.3 F (37.4 C)] 98.4 F (36.9 C) (08/24 1324) Pulse Rate:  [85-97] 86 (08/24 1324) Resp:  [15-24] 24 (08/24 1324) BP: (82-107)/(59-72) 107/69 (08/24 1324) SpO2:   [92 %-98 %] 94 % (08/24 1324) Weight:  [61.5 kg] 61.5 kg (08/24 0413) Last BM Date : 08/12/24  Intake/Output Summary (Last 24 hours) at 08/12/2024 1345 Last data filed at 08/12/2024 1323 Gross per 24 hour  Intake 2120.08 ml  Output --  Net 2120.08 ml    Intake/Output from previous day: 08/23 0701 - 08/24 0700 In: 1059.1 [I.V.:404.4; Blood:356; IV Piggyback:298.7] Out: -  Intake/Output this shift: Total I/O In: 1061 [Blood:1061] Out: -    Physical Exam:  General: Alert female in NAD Heart:  Regular rate Pulmonary: Normal respiratory effort Abdomen: Soft, distended, nontender. Tympanic. Extremities: 1+ BLE edema Neurologic: Alert and oriented Psych: Pleasant. Cooperative   LOS: 4 days   Rosario JAYSON Kidney, MD 08/12/2024, 1:45 PM

## 2024-08-12 NOTE — Progress Notes (Signed)
 PROGRESS NOTE        PATIENT DETAILS Name: Rachel Lucas Age: 63 y.o. Sex: female Date of Birth: January 12, 1961 Admit Date: 08/08/2024 Admitting Physician Mauricio Toribio Furnace, MD ERE:[id number], No Pcp Per  Brief Summary: Patient is a 63 y.o.  female with history of hypothyroidism-longstanding EtOH use-presented with left ear bleeding (Q-tip injury)-upon further evaluation-she was found to have new diagnosis of liver cirrhosis (apparently was told by her chiropractor that she looked yellow approximately 2 weeks back) and Klebsiella bacteremia.  Significant events: 8/20>> admit to [facility]  Significant studies: 8/20>> CT abdomen/pelvis: Consistent with micronodular hepatic cirrhosis/portal venous hypertension with gastroesophageal varices.  Moderate ascites. 8/20>> CT temporal bones: Large amount of external material at the opening of the left external auditory canal-complete opacification of the canal.  Significant microbiology data: 8/20>> blood culture: Klebsiella pneumoniae  Procedures: None  Consults: GI ENT Palliative care  Subjective: No chest pain or shortness of breath.  Lying comfortably in bed.  Objective: Vitals: Blood pressure (!) 89/62, pulse 91, temperature 99.1 F (37.3 C), temperature source Oral, resp. rate 15, height 5' 3 (1.6 m), weight 61.5 kg, SpO2 94%.   Exam:  Severely weak and cachectic Caucasian female, awake Alert, No new F.N deficits, severe icterus, left earlobe under bandage,  [place].AT,PERRAL Supple Neck, No JVD,   Symmetrical Chest wall movement, Good air movement bilaterally, CTAB RRR,No Gallops, Rubs or new Murmurs,  +ve B.Sounds, Abd Soft, No tenderness,   No Cyanosis, Clubbing or edema    Assessment/Plan:  Left ear bleeding Secondary Q-tip trauma to canal ENT following-Glasscock dressing removed 8/22-on Ciprodex  4 drops twice daily x 2 weeks Seen by ENT Dr. ROSALEA on 08/12/2024, given her FFP and vitamin K  on  08/12/2024 as likely ongoing bleeding in the setting of ESLD is due to that, H&H and platelets have improved will continue to monitor  Klebsiella bacteremia Unclear whether this is from left ear issues or from UTI (UA grossly abnormal-mild dysuria).  No significant ascites on IR evaluation for paracentesis-hence unlikely to be SBP related bacteremia. Urine cultures ordered 8/21-never sent-doubt of any utility at this point CT abdomen/pelvis without any major urologic/biliary issues Continue IV Rocephin -since slowly improving-will plan on transition to oral antibiotic in the next day or so.  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  Alcoholic hepatitis Transaminase elevation pattern consistent with alcoholic hepatitis Discriminant function more than 32-unable to use steroids given active infection with bacteremia Follow LFTs. GI following.  Counseled to abstain from alcohol.  ?  1 episode of hematochezia at med center Patient denies any hematochezia at med center-this was apparently seen by RN-no further hematochezia episodes since hospitalization Hb relatively stable Has evidence of portal hypertension/varices by CT imaging-GI planning endoscopy at some point in the future-possibly as an outpatient.  Normocytic anemia Likely secondary to underlying liver cirrhosis/folic acid  deficiency No active GI bleeding noted Hb stable and actually improving Continue folic acid  supplementation LDH minimally elevated-no clinical evidence of hemolysis.  Bilirubin is elevated due to liver cirrhosis-pattern of bilirubin elevation is also not  consistent with hemolysis. Follow CBC.  Hyponatremia Currently appears dehydrated on 08/12/2024 gentle IV  fluids after transfusions are done on 08/12/2024 urine sodium less than 10, monitor  Hypokalemia/hypomagnesemia/hypophosphatemia Due to EtOH use Continue to replete/recheck.  EtOH use Last drink per patient on 8/16, counseled to abstain completely from alcohol use. Would be out of window for any withdrawal symptoms Counseled extensively regarding stopping any further alcohol use going forward.  Hypothyroidism TSH stable Synthroid   Code status:   Code Status: Full Code   DVT Prophylaxis: SCDs Start: 08/08/24 2113   Family Communication: None at bedside   Disposition Plan: Status is: Inpatient Remains inpatient appropriate because: Severity of illness   Planned Discharge Destination:Home   Diet: Diet Order             Diet Heart Fluid consistency: Thin; Fluid restriction: 1500 mL Fluid  Diet effective now                     Data Review:    Data Review:   Inpatient Medications  Scheduled Meds:  sodium chloride    Intravenous Once   acetaminophen   650 mg Oral Once   ciprofloxacin -dexamethasone   4 drop Left EAR BID   feeding supplement  1 Container Oral TID BM   folic acid   2 mg Oral Daily   lactulose   10 g Oral BID   levothyroxine   88 mcg Oral QAC breakfast   midodrine   5 mg Oral TID WC   multivitamin with minerals  1 tablet Oral Daily   pantoprazole  (PROTONIX ) IV  40 mg Intravenous Q12H   potassium chloride   40 mEq Oral BID   sodium chloride  flush  3 mL Intravenous Q12H   thiamine   100 mg Oral Daily   Or   thiamine   100 mg Intravenous Daily   Continuous Infusions:  cefTRIAXone  (ROCEPHIN )  IV Stopped (08/11/24 2248)   lactated ringers      phytonadione  (VITAMIN K ) 5 mg in dextrose 5 % 50 mL IVPB     PRN Meds:.albuterol , diazepam  **OR** diazepam , diphenhydrAMINE , melatonin, ondansetron  (ZOFRAN ) IV, mouth rinse, simethicone   DVT  Prophylaxis  SCDs Start: 08/08/24 2113   Recent Labs  Lab 08/08/24 1325 08/08/24 2118 08/09/24 0518 08/10/24 0545 08/11/24 0943 08/12/24 0045 08/12/24 0548  WBC 9.8   < > 7.5 7.8 7.5 6.7 8.2  HGB 11.5*   < > 10.1* 10.0* 11.1* 8.5* 10.7*  HCT 32.0*   < > 27.9* 27.5* 31.4* 23.9* 30.0*  PLT 33*   < > 43* 34* 35* 43* 73*  MCV 105.6*   < > 105.3* 106.6* 106.8* 107.7* 107.9*  MCH 38.0*   < > 38.1* 38.8* 37.8* 38.3* 38.5*  MCHC 35.9   < > 36.2* 36.4* 35.4 35.6 35.7  RDW 15.7*   < > 15.9* 15.6* 15.4 15.5 15.7*  LYMPHSABS 0.7  --   --   --   --   --   --   MONOABS 1.1*  --   --   --   --   --   --   EOSABS 0.0  --   --   --   --   --   --   BASOSABS 0.0  --   --   --   --   --   --    < > = values in this interval not displayed.    Recent Labs  Lab 08/08/24 2118 08/09/24 0010 08/09/24 0518 08/10/24 0545 08/11/24 0943 08/12/24 0045 08/12/24 0548  NA 126*  --  126* 126* 125*  --  128*  K 3.6  --  3.1* 2.8* 3.0*  --  3.0*  CL 92*  --  93* 94* 91*  --  94*  CO2 20*  --  21* 25 24  --  25  ANIONGAP 14  --  12 7 10   --  9  GLUCOSE 123*  --  98 88 99  --  85  BUN 6  --  <5* <5* 5*  --  <5*  CREATININE 0.66  --  0.76 0.68 0.80  --  0.81  AST 299*  --  255* 255* 266*  --  219*  ALT 64*  --  56* 60* 67*  --  60*  ALKPHOS 113  --  89 90 98  --  94  BILITOT 7.2*  --  5.9* 6.0* 8.3*  --  9.3*  ALBUMIN  1.8*  --  1.7* 1.6* 1.7*  --  2.3*  LATICACIDVEN 3.1* 3.2* 2.5*  --   --   --   --   INR  --   --  1.7* 2.0* 2.3* 2.2* 1.8*  TSH 3.397  --   --   --   --   --   --   AMMONIA 57*  --   --   --   --   --   --   MG 1.5*  --  1.3* 1.9 1.8  --   --   PHOS 1.9*  --  1.8* 2.4* 2.6  --   --   CALCIUM 7.3*  --  7.2* 7.0* 7.2*  --  7.5*      Recent Labs  Lab 08/08/24 2118 08/09/24 0010 08/09/24 0518 08/10/24 0545 08/11/24 0943 08/12/24 0045 08/12/24 0548  LATICACIDVEN 3.1* 3.2* 2.5*  --   --   --   --   INR  --   --  1.7* 2.0* 2.3* 2.2* 1.8*  TSH 3.397  --   --   --   --   --    --   AMMONIA 57*  --   --   --   --   --   --   MG 1.5*  --  1.3* 1.9 1.8  --   --   CALCIUM 7.3*  --  7.2* 7.0* 7.2*  --  7.5*    Radiology Reports  No results found.    Signature  -   Lavada Stank M.D on 08/12/2024 at 8:58 AM   -  To page go to [email or web address]

## 2024-08-12 NOTE — Consult Note (Incomplete)
 Palliative Medicine Inpatient Consult Note  Consulting Provider:  Dennise Lavada POUR, MD   Reason for consult:   Palliative Care Consult Services Palliative Medicine Consult  Reason for Consult? Alcoholic cirrhosis, auto anticoagulation, extremely weak and cachectic, goals of care.   08/12/2024  HPI:  Per intake H&P -->  Patient is a 63 y.o.  female with history of hypothyroidism-longstanding EtOH use-presented with left ear bleeding (Q-tip injury)-upon further evaluation-she was found to have new diagnosis of liver cirrhosis (apparently was told by her chiropractor that she looked yellow approximately 2 weeks back) and Klebsiella bacteremia. Palliative care is involved to support goals of care conversation(s).   Clinical Assessment/Goals of Care:  *Please note that this is a verbal dictation therefore any spelling or grammatical errors are due to the Dragon Medical One system interpretation.  I have reviewed medical records including EPIC notes, labs and imaging, received report from bedside RN, assessed the patient.    I met with *** to further discuss diagnosis prognosis, GOC, EOL wishes, disposition and options.   I introduced Palliative Medicine as specialized medical care for people living with serious illness. It focuses on providing relief from the symptoms and stress of a serious illness. The goal is to improve quality of life for both the patient and the family.  Medical History Review and Understanding:    Social History:    Functional and Nutritional State:    Palliative Symptoms:    Advance Directives: A detailed discussion was had today regarding advanced directives.    Code Status: Concepts specific to code status, artifical feeding and hydration, continued IV antibiotics and rehospitalization was had.  The difference between a aggressive medical intervention path  and a palliative comfort care path for this patient at this time was had.   Encouraged  patient/family to consider DNR/DNI status understanding evidenced based poor outcomes in similar hospitalized patient, as the cause of arrest is likely associated with advanced chronic/terminal illness rather than an easily reversible acute cardio-pulmonary event. I explained that DNR/DNI does not change the medical plan and it only comes into effect after a person has arrested (died).  It is a protective measure to keep us  from harming the patient in their last moments of life. *** was agreeable to DNR/DNI with understanding that patient would not receive CPR, defibrillation, ACLS medications, or intubation.   Discussion:    Discussed the importance of continued conversation with family and their  medical providers regarding overall plan of care and treatment options, ensuring decisions are within the context of the patients values and GOCs.  Provided *** Hard Choices for [employer] booklet.   Provided *** Gone From [employer] booklet.  Decision Maker:  SUMMARY OF RECOMMENDATIONS    Code Status/Advance Care Planning: FULL CODE  DNAR/DNI  Modified CODE   Symptom Management:   Palliative Prophylaxis:  Aspiration, Bowel Regimen, Delirium Protocol, Frequent Pain Assessment, Oral Care, Palliative Wound Care, and Turn Reposition  Additional Recommendations (Limitations, Scope, Preferences): Avoid Hospitalization, Full Scope Treatment, No Artificial Feeding, No Surgical Procedures, and No Tracheostomy  Psycho-social/Spiritual:  Desire for further Chaplaincy support:  Additional Recommendations:    Prognosis:   Discharge Planning:   ROS  Oral Intake %:   I/O:   Bowel Movements:   Mobility:   Vitals:   08/12/24 1405 08/12/24 1410  BP:  104/74  Pulse: 79 83  Resp: (!) 22 20  Temp:  98.6 F (37 C)  SpO2: 94% 96%    Intake/Output Summary (Last  24 hours) at 08/12/2024 1442 Last data filed at 08/12/2024 1405 Gross per 24 hour  Intake 2319.08 ml  Output --  Net  2319.08 ml   Last Weight  Most recent update: 08/12/2024  4:13 AM    Weight  61.5 kg (135 lb 9.3 oz)             LABS: CBC:    Component Value Date/Time   WBC 8.2 08/12/2024 0548   HGB 10.7 (L) 08/12/2024 0548   HCT 30.0 (L) 08/12/2024 0548   PLT 73 (L) 08/12/2024 0548   MCV 107.9 (H) 08/12/2024 0548   NEUTROABS 7.8 (H) 08/08/2024 1325   LYMPHSABS 0.7 08/08/2024 1325   MONOABS 1.1 (H) 08/08/2024 1325   EOSABS 0.0 08/08/2024 1325   BASOSABS 0.0 08/08/2024 1325   Comprehensive Metabolic Panel:    Component Value Date/Time   NA 128 (L) 08/12/2024 0548   K 3.0 (L) 08/12/2024 0548   CL 94 (L) 08/12/2024 0548   CO2 25 08/12/2024 0548   BUN <5 (L) 08/12/2024 0548   CREATININE 0.81 08/12/2024 0548   GLUCOSE 85 08/12/2024 0548   CALCIUM 7.5 (L) 08/12/2024 0548   AST 219 (H) 08/12/2024 0548   ALT 60 (H) 08/12/2024 0548   ALKPHOS 94 08/12/2024 0548   BILITOT 9.3 (H) 08/12/2024 0548   PROT 7.6 08/12/2024 0548   ALBUMIN  2.3 (L) 08/12/2024 0548    Gen:  NAD HEENT: moist mucous membranes CV: Regular rate and rhythm, no murmurs rubs or gallops PULM: clear to auscultation bilaterally. No wheezes/rales/rhonchi*** ABD: soft/nontender/nondistended/normal bowel sounds*** EXT: No edema*** Neuro: Alert and oriented x3***  PPS:   This conversation/these recommendations were discussed with patient primary care team, Dr. PIERRETTE  Time In: Time Out: Total Time: ***  Billing based on MDM: ***  {Problems Addressed:304933}  {Amount and/or Complexity of Ijuj:695065}  {Risks:304936} ______________________________________________________ Rosaline Becton [place] Palliative Medicine Team Team Cell Phone: [phone] Please utilize secure chat with additional questions, if there is no response within 30 minutes please call the above phone number  Palliative Medicine Team providers are available by phone from 7am to 7pm daily and can be reached through the team cell phone.   Should this patient require assistance outside of these hours, please call the patient's attending physician.

## 2024-08-12 NOTE — Progress Notes (Signed)
 Dressing placed over left ear this morning by Dr. Karis has accumulated quite a bit of sersanguineous drainage.  Dr. Karis indicated he would be by again this evening to examine.

## 2024-08-12 NOTE — Progress Notes (Signed)
 Pt dressing being changed hourly due to saturation. Oncall MD notified of severity of issue new orders given to obtain blood stat and continue q2 hour dress changes more frequent if needed dressing changes

## 2024-08-12 NOTE — Progress Notes (Signed)
 Platelets completed pt dressing changed again dressing changes q hour or less for the last few hours,

## 2024-08-12 NOTE — Progress Notes (Addendum)
 Lab results show a trending downward H&H, and elevated PT and INR, MD aware new orders given for Vit K, transfuse platelets and PRBCs. Vit K given, Pt pre medicated for platelet transfusion, platelets started. Awaiting type and screen to be completed. Pt dressing changed as needed has slowed some last dressing lasted 2.5 hours

## 2024-08-12 NOTE — Care Plan (Signed)
     Referral received for Rachel Lucas : goals of care discussion. Chart reviewed and updates received. Patient reports she is having a bad day and would like to postpone meeting with PMT until tomorrow. Her husband Meli Faley confirms he will be available tomorrow.  PMT will re-attempt to meet with patient and family Monday. Detailed note and recommendations to follow once GOC has been completed.   Thank you for your referral and allowing PMT to assist in Rachel Lucas's care.   Stephane Palin, NP Palliative Medicine Team  Team Phone # [phone]   NO CHARGE

## 2024-08-12 NOTE — Progress Notes (Signed)
 In to assess pt found that pt is draining blood from left ear per report and pt cotton ball completely saturated and pt holding bloody washcloth to her ear, pt face, neck, and hair line bloody. Pt cleaned, red bonnet applied to pt head and dry dressing gauze, drainage sponge and paper tape applied.

## 2024-08-13 ENCOUNTER — Inpatient Hospital Stay (HOSPITAL_COMMUNITY)

## 2024-08-13 ENCOUNTER — Telehealth (INDEPENDENT_AMBULATORY_CARE_PROVIDER_SITE_OTHER): Payer: Self-pay | Admitting: Otolaryngology

## 2024-08-13 DIAGNOSIS — Z7189 Other specified counseling: Secondary | ICD-10-CM | POA: Diagnosis not present

## 2024-08-13 DIAGNOSIS — Z515 Encounter for palliative care: Secondary | ICD-10-CM

## 2024-08-13 DIAGNOSIS — K7290 Hepatic failure, unspecified without coma: Secondary | ICD-10-CM | POA: Diagnosis not present

## 2024-08-13 DIAGNOSIS — R0602 Shortness of breath: Secondary | ICD-10-CM | POA: Diagnosis not present

## 2024-08-13 DIAGNOSIS — K7031 Alcoholic cirrhosis of liver with ascites: Secondary | ICD-10-CM | POA: Diagnosis not present

## 2024-08-13 DIAGNOSIS — J90 Pleural effusion, not elsewhere classified: Secondary | ICD-10-CM | POA: Diagnosis not present

## 2024-08-13 DIAGNOSIS — K7010 Alcoholic hepatitis without ascites: Secondary | ICD-10-CM | POA: Diagnosis not present

## 2024-08-13 DIAGNOSIS — H9222 Otorrhagia, left ear: Secondary | ICD-10-CM | POA: Diagnosis not present

## 2024-08-13 DIAGNOSIS — D696 Thrombocytopenia, unspecified: Secondary | ICD-10-CM | POA: Diagnosis not present

## 2024-08-13 DIAGNOSIS — K7011 Alcoholic hepatitis with ascites: Secondary | ICD-10-CM | POA: Diagnosis not present

## 2024-08-13 DIAGNOSIS — K7030 Alcoholic cirrhosis of liver without ascites: Secondary | ICD-10-CM | POA: Insufficient documentation

## 2024-08-13 LAB — PREPARE PLATELET PHERESIS: Unit division: 0

## 2024-08-13 LAB — CBC WITH DIFFERENTIAL/PLATELET
Abs Granulocyte: 4.8 K/uL (ref 1.5–6.5)
Abs Immature Granulocytes: 0.03 K/uL (ref 0.00–0.07)
Basophils Absolute: 0 K/uL (ref 0.0–0.1)
Basophils Relative: 0 %
Eosinophils Absolute: 0 K/uL (ref 0.0–0.5)
Eosinophils Relative: 0 %
HCT: 27.1 % — ABNORMAL LOW (ref 36.0–46.0)
Hemoglobin: 9.4 g/dL — ABNORMAL LOW (ref 12.0–15.0)
Immature Granulocytes: 1 %
Lymphocytes Relative: 8 %
Lymphs Abs: 0.4 K/uL — ABNORMAL LOW (ref 0.7–4.0)
MCH: 36 pg — ABNORMAL HIGH (ref 26.0–34.0)
MCHC: 34.7 g/dL (ref 30.0–36.0)
MCV: 103.8 fL — ABNORMAL HIGH (ref 80.0–100.0)
Monocytes Absolute: 0.6 K/uL (ref 0.1–1.0)
Monocytes Relative: 10 %
Neutro Abs: 4.8 K/uL (ref 1.7–7.7)
Neutrophils Relative %: 81 %
Platelets: 49 K/uL — ABNORMAL LOW (ref 150–400)
RBC: 2.61 MIL/uL — ABNORMAL LOW (ref 3.87–5.11)
RDW: 21.5 % — ABNORMAL HIGH (ref 11.5–15.5)
WBC: 5.9 K/uL (ref 4.0–10.5)
nRBC: 0 % (ref 0.0–0.2)

## 2024-08-13 LAB — BPAM FFP
Blood Product Expiration Date: 202508272359
Blood Product Expiration Date: 202508272359
ISSUE DATE / TIME: 202508241130
ISSUE DATE / TIME: 202508241331
Unit Type and Rh: 6200
Unit Type and Rh: 6200

## 2024-08-13 LAB — TYPE AND SCREEN
ABO/RH(D): O POS
Antibody Screen: NEGATIVE
Unit division: 0

## 2024-08-13 LAB — CULTURE, BLOOD (ROUTINE X 2)
Culture: NO GROWTH
Special Requests: ADEQUATE

## 2024-08-13 LAB — PREPARE FRESH FROZEN PLASMA: Unit division: 0

## 2024-08-13 LAB — COMPREHENSIVE METABOLIC PANEL WITH GFR
ALT: 41 U/L (ref 0–44)
AST: 126 U/L — ABNORMAL HIGH (ref 15–41)
Albumin: 2.5 g/dL — ABNORMAL LOW (ref 3.5–5.0)
Alkaline Phosphatase: 74 U/L (ref 38–126)
Anion gap: 6 (ref 5–15)
BUN: <5 mg/dL — ABNORMAL LOW (ref 6–20)
CO2: 25 mmol/L (ref 22–32)
Calcium: 7.7 mg/dL — ABNORMAL LOW (ref 8.9–10.3)
Chloride: 99 mmol/L (ref 98–111)
Creatinine, Ser: 0.65 mg/dL (ref 0.44–1.00)
GFR, Estimated: >60 mL/min (ref >60)
Glucose, Bld: 120 mg/dL — ABNORMAL HIGH (ref 70–99)
Potassium: 3.4 mmol/L — ABNORMAL LOW (ref 3.5–5.1)
Sodium: 130 mmol/L — ABNORMAL LOW (ref 135–145)
Total Bilirubin: 7.2 mg/dL — ABNORMAL HIGH (ref 0.0–1.2)
Total Protein: 6.3 g/dL — ABNORMAL LOW (ref 6.5–8.1)

## 2024-08-13 LAB — BPAM RBC
Blood Product Expiration Date: 202509162359
ISSUE DATE / TIME: 202508240821
Unit Type and Rh: 5100

## 2024-08-13 LAB — BPAM PLATELET PHERESIS
Blood Product Expiration Date: 202508262359
ISSUE DATE / TIME: 202508240347
Unit Type and Rh: 6200

## 2024-08-13 LAB — MAGNESIUM: Magnesium: 2 mg/dL (ref 1.7–2.4)

## 2024-08-13 LAB — UREA NITROGEN, URINE: Urea Nitrogen, Ur: 452 mg/dL

## 2024-08-13 LAB — PROTIME-INR
INR: 2.1 — ABNORMAL HIGH (ref 0.8–1.2)
Prothrombin Time: 24.4 s — ABNORMAL HIGH (ref 11.4–15.2)

## 2024-08-13 LAB — ANTI-SMOOTH MUSCLE ANTIBODY, IGG: F-Actin IgG: 15 U (ref 0–19)

## 2024-08-13 MED ORDER — POTASSIUM CHLORIDE CRYS ER 20 MEQ PO TBCR
40.0000 meq | EXTENDED_RELEASE_TABLET | Freq: Once | ORAL | Status: AC
  Administered 2024-08-13: 40 meq via ORAL
  Filled 2024-08-13: qty 2

## 2024-08-13 NOTE — TOC CM/SW Note (Signed)
 Transition of Care [facility]) - Inpatient Brief Assessment   Patient Details  Name: Rachel Lucas MRN: [id number] Date of Birth: [birth year]  [facility]) CM/SW Contact:    Tom-Johnson, Harvest Muskrat, RN Phone Number: 08/13/2024, 5:07 PM   Clinical Narrative:  Patient transferred from the [facility] to [facility] with Ear Bleeding and Jaundice. 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 Patient states she drinks 1.5 glasses of wine daily.  Has hx of Hypothyroidism, HLD and Alcohol use.  ENT consulted, no intervention at this time. GI following for Decompensated Liver Cirrhosis.    From home with husband, does not have children. Has three supportive children. Not currently employed, not on disability. Has a shower seat at home.  Patient states her PCP is at [facility] and recently retired. States she has a scheduled appointment with her new PCP but has not seen them yet. Will f/u at discharge.   PT updated recommendation to no f/u.  Patient not Medically ready for discharge.  CM will continue to follow as patient progresses with care towards discharge.             Transition of Care Asessment: Insurance and Status: Insurance coverage has been reviewed Patient has primary care physician: Yes Home environment has been reviewed: Yes Prior level of function:: Independent Prior/Current Home Services: No current home services Social Drivers of Health Review: SDOH reviewed no interventions necessary Readmission risk has been reviewed: Yes Transition of care needs: no transition of care needs at this time

## 2024-08-13 NOTE — Telephone Encounter (Signed)
 LVM for patient to return call to schedule followup with Dr. Tobie 1st week September.

## 2024-08-13 NOTE — Progress Notes (Signed)
 Progress Note   Subjective  Patient states she is feeling better, ate a good breakfast. States her ear bleeding has resolved. Husband at bedside.    Objective   Vital signs in last 24 hours: Temp:  [97.4 F (36.3 C)-98.6 F (37 C)] 97.5 F (36.4 C) (08/25 0900) Pulse Rate:  [72-86] 84 (08/25 0800) Resp:  [16-24] 20 (08/25 0800) BP: (96-112)/(60-76) 97/64 (08/25 0900) SpO2:  [92 %-96 %] 94 % (08/25 0800) Weight:  [56.6 kg] 56.6 kg (08/25 0440) Last BM Date : 08/12/24 General:    white female in NAD, jaundiced Neurologic:  Alert and oriented,  grossly normal neurologically. Psych:  Cooperative. Normal mood and affect.  Intake/Output from previous day: 08/24 0701 - 08/25 0700 In: 1613 [I.V.:3; Blood:1260; IV Piggyback:350] Out: -  Intake/Output this shift: Total I/O In: 360 [P.O.:360] Out: -   Lab Results: Recent Labs    08/12/24 0045 08/12/24 0548 08/13/24 0557  WBC 6.7 8.2 5.9  HGB 8.5* 10.7* 9.4*  HCT 23.9* 30.0* 27.1*  PLT 43* 73* 49*   BMET Recent Labs    08/11/24 0943 08/12/24 0548 08/13/24 0557  NA 125* 128* 130*  K 3.0* 3.0* 3.4*  CL 91* 94* 99  CO2 24 25 25   GLUCOSE 99 85 120*  BUN 5* <5* <5*  CREATININE 0.80 0.81 0.65  CALCIUM 7.2* 7.5* 7.7*   LFT Recent Labs    08/12/24 0548 08/13/24 0557  PROT 7.6 6.3*  ALBUMIN  2.3* 2.5*  AST 219* 126*  ALT 60* 41  ALKPHOS 94 74  BILITOT 9.3* 7.2*  BILIDIR 4.6*  --   IBILI 4.7*  --    PT/INR Recent Labs    08/12/24 0548 08/13/24 0557  LABPROT 21.9* 24.4*  INR 1.8* 2.1*   MELD 3.0: 27 at 08/13/2024  5:57 AM MELD-Na: 26 at 08/13/2024  5:57 AM Calculated from: Serum Creatinine: 0.65 mg/dL (Using min of 1 mg/dL) at 1/74/7974  4:42 AM Serum Sodium: 130 mmol/L at 08/13/2024  5:57 AM Total Bilirubin: 7.2 mg/dL at 1/74/7974  4:42 AM Serum Albumin : 2.5 g/dL at 1/74/7974  4:42 AM INR(ratio): 2.1 at 08/13/2024  5:57 AM Age at listing (hypothetical): 62 years Sex: Female at 08/13/2024  5:57  AM     Assessment / Plan:    63 y/o female here with the following:  Decompensated cirrhosis Alcoholic hepatitis Klebsiella / enterobacter bacteremia -  source, UTI vs. left ear Hyponatremia  Newly diagnosed cirrhosis with alcoholic hepatitis, DF > 32, MELD 27. Infection treated, started on prednisolone  yesterday. Stable at this time, seems to be eating okay. Bilirubin slightly today. Will need to trend labs and check Lille score on day 7.  Discussed severity of her liver disease, at risk for mortality. [facility] has been notified in case she worsens and needs transplant evaluation. I had a lengthy discussion with the patient and her husband today, explained risks for worsening over time, complete alcohol abstinence recommended and she seems motivated to abstain. Hopefully in time she can improve, counseled this will take weeks to months. Only mild ascites on US , no paracentesis done.   Of note, she has stopped bleeding from her ear, appreciate ENT assistance. She reports to me a strong family history of Von Willebrands and thinks she was diagnosed remotely with this. Has had chronic easy bruising / bleeding her entire life, bleeding with dental procedures in the past. May be difficult to assess for that if she has had FFP recently, will  discuss with primary service. 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 It would be good to do an EGD at some point to assess her bleeding risk from varices, can consider it prior to discharge if her platelets can get above 50 and coags stable. Otherwise continue supportive car, will follow.  PLAN: - continue prednisolone , repeat Lille score at day 7 - continue antibiotics - continue low NA diet - monitor for recurrent bleeding - checking INR, LFTs daily - continue lactulose  - complete alcohol abstinence - consideration for EGD prior to discharge if stable for it, screen for varices - workup for VonWillebrands per  primary service  Call with questions, we will follow.  Marcey Naval, MD [facility]

## 2024-08-13 NOTE — Consult Note (Signed)
 Consultation Note Date: 08/13/2024   Patient Name: Rachel Lucas  DOB: [birth year]  MRN: [id number]  Age / Sex: 63 y.o., female  PCP: Patient, No Pcp Per Referring Physician: Dennise Lavada POUR, MD  Reason for Consultation: Establishing goals of care  HPI/Patient Profile: 63 y.o. female  with past medical history of hypothyroidism, HLD, recently diagnosed admitted on 08/08/2024 with L ear bleeding and jaundice found to have decompensated liver cirrhosis, alcohol-related hepatitis, and Klebsiella bacteremia.   Clinical Assessment and Goals of Care: Consult received and chart review completed. I met today at [name]'s bedside along with her husband, [name], as well. I introduced myself and explained that I am from palliative care. [name] is resistant to speaking with me but tolerates my visit. We reviewed how she is feeling and she reports feeling much better today. She is happy that her ear bleeding is much improved after intervention from ENT and they have given her to approval for a shower. She shares that she had no pain but the large dressing and constant bleeding was very irritating. We reviewed labs and that they seem stable with slight improvement in bilirubin. [name] is avoidant of discussing her diagnosis and the significance of her current health issues at this time.   [name] lives at home with her husband and their dog. She enjoys cooking and is a good cook according to her husband - this makes eating the hospital food even more difficult! She is hopeful for further improvement and hopes to return home over the next few days. She reports frustrations with mixed communication with the hospital staff and hearing different things from different people at times. They do have good understanding of her illness but are hopeful for improvement and making changes to keep her as healthy as possible.   I left [name]  and [name] to visit with her sister and brother-in-law. I asked [name] if I could check on her tomorrow and she tells me maybe - it depends on how I'm feeling. She is still resistant to palliative although I did explain my role for support and to help optimize quality of life in the setting of significant life and health changes.   All questions/concerns addressed. Emotional support provided.   Primary Decision Maker PATIENT    SUMMARY OF RECOMMENDATIONS   - Will continue to offer support as patient allows - Resistant to palliative care and conversations  Code Status/Advance Care Planning: Full code - did not discuss today   Symptom Management:  Per attending, GI, ENT  Prognosis:  Overall prognosis poor with decompensated liver cirrhosis  Discharge Planning: Home with Home Health      Primary Diagnoses: Present on Admission:  Liver failure (HCC)   I have reviewed the medical record, interviewed the patient and family, and examined the patient. The following aspects are pertinent.  Past Medical History:  Diagnosis Date   Hypothyroidism    Thyroid  disease    Von Willebrand disease (HCC)    Social History   Socioeconomic History   Marital  status: Married    Spouse name: [name]   Number of children: 0   Years of education: 12   Highest education level: Bachelor's degree (e.g., BA, AB, BS)  Occupational History   Not on file  Tobacco Use   Smoking status: Former    Current packs/day: 0.00    Average packs/day: 0.2 packs/day for 10.0 years (2.0 ttl pk-yrs)    Types: Cigarettes    Start date: 12/21/1979    Quit date: 12/20/1989    Years since quitting: 34.6   Smokeless tobacco: Not on file   Tobacco comments:    Pt also notes passive exposure- father smoked in house growing up.   Vaping Use   Vaping status: Never Used  Substance and Sexual Activity   Alcohol use: Yes    Alcohol/week: 8.0 standard drinks of alcohol    Types: 8 Glasses of wine per week    Drug use: Never   Sexual activity: Yes    Birth control/protection: None  Other Topics Concern   Not on file  Social History Narrative   Not on file   Social Drivers of Health   Financial Resource Strain: Not on file  Food Insecurity: No Food Insecurity (08/08/2024)   Hunger Vital Sign    Worried About Running Out of Food in the Last Year: Never true    Ran Out of Food in the Last Year: Never true  Transportation Needs: No Transportation Needs (08/08/2024)   PRAPARE - Administrator, Civil Service (Medical): No    Lack of Transportation (Non-Medical): No  Physical Activity: Not on file  Stress: Not on file  Social Connections: Not on file   Family History  Problem Relation Age of Onset   Asthma Mother    Eczema Mother    Scheduled Meds:  ciprofloxacin -dexamethasone   4 drop Left EAR BID   feeding supplement  237 mL Oral BID BM   folic acid   2 mg Oral Daily   lactulose   10 g Oral BID   levothyroxine   88 mcg Oral QAC breakfast   midodrine   5 mg Oral TID WC   multivitamin with minerals  1 tablet Oral Daily   pantoprazole  (PROTONIX ) IV  40 mg Intravenous Q12H   prednisoLONE   40 mg Oral Daily   sodium chloride  flush  3 mL Intravenous Q12H   thiamine   100 mg Oral Daily   Or   thiamine   100 mg Intravenous Daily   Continuous Infusions:  cefTRIAXone  (ROCEPHIN )  IV Stopped (08/12/24 2025)   PRN Meds:.albuterol , diazepam  **OR** diazepam , diphenhydrAMINE , melatonin, ondansetron  (ZOFRAN ) IV, mouth rinse, simethicone  Allergies  Allergen Reactions   Levofloxacin Other (See Comments)   Sulfonamide Derivatives Palpitations   Review of Systems  Constitutional:  Positive for appetite change. Negative for activity change.  HENT:         L ear bleeding    Physical Exam Vitals and nursing note reviewed.  Constitutional:      General: She is not in acute distress. HENT:     Ears:     Comments: L ear packed with small amount of dried blood Eyes:     General: Scleral  icterus present.  Cardiovascular:     Rate and Rhythm: Normal rate.  Pulmonary:     Effort: No tachypnea, accessory muscle usage or respiratory distress.  Abdominal:     General: Abdomen is flat.     Palpations: Abdomen is soft.  Neurological:     Mental Status:  She is alert and oriented to person, place, and time.  Psychiatric:     Comments: Frustrated and anxious     Vital Signs: BP 97/64 (BP Location: Left Arm)   Pulse 84   Temp (!) 97.5 F (36.4 C) (Oral)   Resp (!) 22   Ht 5' 3 (1.6 m)   Wt 56.6 kg   SpO2 94%   BMI 22.10 kg/m  Pain Scale: 0-10   Pain Score: 0-No pain   SpO2: SpO2: 94 % O2 Device:SpO2: 94 % O2 Flow Rate: .   IO: Intake/output summary:  Intake/Output Summary (Last 24 hours) at 08/13/2024 1057 Last data filed at 08/13/2024 1000 Gross per 24 hour  Intake 1568.98 ml  Output --  Net 1568.98 ml    LBM: Last BM Date : 08/12/24 Baseline Weight: Weight: 53.5 kg Most recent weight: Weight: 56.6 kg     Palliative Assessment/Data:     Time Total: 75 min  Greater than 50%  of this time was spent counseling and coordinating care related to the above assessment and plan.  Signed by: Bernarda Kitty, NP Palliative Medicine Team Pager # [phone] (M-F 8a-5p) Team Phone # [phone] (Nights/Weekends)

## 2024-08-13 NOTE — Progress Notes (Signed)
 PROGRESS NOTE        PATIENT DETAILS Name: Rachel Lucas Age: 63 y.o. Sex: female Date of Birth: Mar 29, 1961 Admit Date: 08/08/2024 Admitting Physician Mauricio Toribio Furnace, MD ERE:[id number], No Pcp Per  Brief Summary: Patient is a 63 y.o.  female with history of hypothyroidism-longstanding EtOH use-presented with left ear bleeding (Q-tip injury)-upon further evaluation-she was found to have new diagnosis of liver cirrhosis (apparently was told by her chiropractor that she looked yellow approximately 2 weeks back) and Klebsiella bacteremia.  Significant events: 8/20>> admit to [facility]  Significant studies: 8/20>> CT abdomen/pelvis: Consistent with micronodular hepatic cirrhosis/portal venous hypertension with gastroesophageal varices.  Moderate ascites. 8/20>> CT temporal bones: Large amount of external material at the opening of the left external auditory canal-complete opacification of the canal.  Significant microbiology data: 8/20>> blood culture: Klebsiella pneumoniae  Procedures: None  Consults: GI ENT Palliative care  Subjective:  Patient in bed, appears comfortable, denies any headache, no fever, no chest pain or pressure, no shortness of breath , no abdominal pain. No new focal weakness.   Objective: Vitals: Blood pressure 112/76, pulse 76, temperature (!) 97.4 F (36.3 C), temperature source Oral, resp. rate 16, height 5' 3 (1.6 m), weight 56.6 kg, SpO2 94%.   Exam:  Severely weak and cachectic Caucasian female, awake Alert, No new F.N deficits, severe icterus, left earlobe under bandage,  Brethren.AT,PERRAL Supple Neck, No JVD,   Symmetrical Chest wall movement, Good air movement bilaterally, CTAB RRR,No Gallops, Rubs or new Murmurs,  +ve B.Sounds, Abd Soft, No tenderness,   No Cyanosis, Clubbing or edema    Assessment/Plan:  Left ear bleeding Secondary Q-tip trauma to canal ENT following-Glasscock dressing removed 8/22-on  Ciprodex  4 drops twice daily x 2 weeks Seen by ENT Dr. ROSALEA on 08/12/2024, given her FFP and vitamin K  on 08/12/2024 as likely ongoing bleeding in the setting of ESLD is due to that, H&H and platelets have improved will continue to monitor, bleeding has now resolved, appreciate ENT input  Klebsiella bacteremia Unclear whether this is from left ear issues or from UTI (UA grossly abnormal-mild dysuria).  No significant ascites on IR evaluation for paracentesis-hence unlikely to be SBP related bacteremia. Urine cultures ordered 8/21-never sent-doubt of any utility at this point CT abdomen/pelvis without any major urologic/biliary issues Continue IV Rocephin -since slowly improving-will plan on transition to oral antibiotic in the next day or so.  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  Alcoholic hepatitis Transaminase elevation pattern consistent with alcoholic hepatitis Discriminant function more than 32-  GI on board on prednisolone , midodrine , as needed albumin , once blood pressure and renal function stable then diuretics as tolerated. Counseled to abstain from alcohol.  ?  1 episode of hematochezia at med center Patient denies any hematochezia at med center-this was apparently seen by RN-no further hematochezia episodes since hospitalization Hb relatively stable Has evidence of portal hypertension/varices by CT imaging-GI planning endoscopy at some point in the future-possibly as an outpatient.  Normocytic anemia Likely secondary to underlying liver cirrhosis/folic acid  deficiency No active GI bleeding noted Hb stable and actually  improving Continue folic acid  supplementation LDH minimally elevated-no  clinical evidence of hemolysis.  Bilirubin is elevated due to liver cirrhosis-pattern of bilirubin elevation is also not consistent with hemolysis. Follow CBC.  Hyponatremia Is intravascularly dehydrated, diuretics held, s/p albumin  on 08/12/2024 per GI with improvement.  Continue to monitor, continue midodrine , blood pressure also stabilizing  Hypokalemia/hypomagnesemia/hypophosphatemia Due to EtOH use Continue to replete/recheck.  EtOH use Last drink per patient on 8/16, counseled to abstain completely from alcohol use. Would be out of window for any withdrawal symptoms Counseled extensively regarding stopping any further alcohol use going forward.  Hypothyroidism TSH stable Synthroid   Code status:   Code Status: Full Code   DVT Prophylaxis: SCDs Start: 08/08/24 2113   Family Communication: None at bedside   Disposition Plan: Status is: Inpatient Remains inpatient appropriate because: Severity of illness   Planned Discharge Destination:Home   Diet: Diet Order             Diet 2 gram sodium Room service appropriate? Yes with Assist; Fluid consistency: Thin  Diet effective now                     Data Review:    Data Review:   Inpatient Medications  Scheduled Meds:  sodium chloride    Intravenous Once   acetaminophen   650 mg Oral Once   ciprofloxacin -dexamethasone   4 drop Left EAR BID   feeding supplement  237 mL Oral BID BM   folic acid   2 mg Oral Daily   lactulose   10 g Oral BID   levothyroxine   88 mcg Oral QAC breakfast   midodrine   5 mg Oral TID WC   multivitamin with minerals  1 tablet Oral Daily   pantoprazole  (PROTONIX ) IV  40 mg Intravenous Q12H   potassium chloride   40 mEq Oral Once   prednisoLONE   40 mg Oral Daily   sodium chloride  flush  3 mL Intravenous Q12H   thiamine   100 mg Oral Daily   Or   thiamine   100 mg Intravenous Daily   thrombin   20,000 Units Topical  Once   Continuous Infusions:  cefTRIAXone  (ROCEPHIN )  IV Stopped (08/12/24 2025)   PRN Meds:.albuterol , diazepam  **OR** diazepam , diphenhydrAMINE , melatonin, ondansetron  (ZOFRAN ) IV, mouth rinse, simethicone   DVT Prophylaxis  SCDs Start: 08/08/24 2113   Recent Labs  Lab 08/08/24 1325 08/08/24 2118 08/09/24 0518 08/10/24 0545 08/11/24 0943 08/12/24 0045 08/12/24 0548  WBC 9.8   < > 7.5 7.8 7.5 6.7 8.2  HGB 11.5*   < > 10.1* 10.0* 11.1* 8.5* 10.7*  HCT 32.0*   < > 27.9* 27.5* 31.4* 23.9* 30.0*  PLT 33*   < > 43* 34* 35* 43* 73*  MCV 105.6*   < > 105.3* 106.6* 106.8* 107.7* 107.9*  MCH 38.0*   < > 38.1* 38.8* 37.8* 38.3* 38.5*  MCHC 35.9   < > 36.2* 36.4* 35.4 35.6 35.7  RDW 15.7*   < > 15.9* 15.6* 15.4 15.5 15.7*  LYMPHSABS 0.7  --   --   --   --   --   --   MONOABS 1.1*  --   --   --   --   --   --   EOSABS 0.0  --   --   --   --   --   --   BASOSABS 0.0  --   --   --   --   --   --    < > = values in this interval not displayed.  Recent Labs  Lab 08/08/24 2118 08/09/24 0010 08/09/24 0518 08/10/24 0545 08/11/24 0943 08/12/24 0045 08/12/24 0548 08/13/24 0557  NA 126*  --  126* 126* 125*  --  128* 130*  K 3.6  --  3.1* 2.8* 3.0*  --  3.0* 3.4*  CL 92*  --  93* 94* 91*  --  94* 99  CO2 20*  --  21* 25 24  --  25 25  ANIONGAP 14  --  12 7 10   --  9 6  GLUCOSE 123*  --  98 88 99  --  85 120*  BUN 6  --  <5* <5* 5*  --  <5* <5*  CREATININE 0.66  --  0.76 0.68 0.80  --  0.81 0.65  AST 299*  --  255* 255* 266*  --  219* 126*  ALT 64*  --  56* 60* 67*  --  60* 41  ALKPHOS 113  --  89 90 98  --  94 74  BILITOT 7.2*  --  5.9* 6.0* 8.3*  --  9.3* 7.2*  ALBUMIN  1.8*  --  1.7* 1.6* 1.7*  --  2.3* 2.5*  LATICACIDVEN 3.1* 3.2* 2.5*  --   --   --   --   --   INR  --   --  1.7* 2.0* 2.3* 2.2* 1.8* 2.1*  TSH 3.397  --   --   --   --   --   --   --   AMMONIA 57*  --   --   --   --   --   --   --   MG 1.5*  --  1.3* 1.9 1.8  --   --  2.0  PHOS 1.9*  --  1.8* 2.4* 2.6  --    --   --   CALCIUM 7.3*  --  7.2* 7.0* 7.2*  --  7.5* 7.7*      Recent Labs  Lab 08/08/24 2118 08/09/24 0010 08/09/24 0518 08/10/24 0545 08/11/24 0943 08/12/24 0045 08/12/24 0548 08/13/24 0557  LATICACIDVEN 3.1* 3.2* 2.5*  --   --   --   --   --   INR  --   --  1.7* 2.0* 2.3* 2.2* 1.8* 2.1*  TSH 3.397  --   --   --   --   --   --   --   AMMONIA 57*  --   --   --   --   --   --   --   MG 1.5*  --  1.3* 1.9 1.8  --   --  2.0  CALCIUM 7.3*  --  7.2* 7.0* 7.2*  --  7.5* 7.7*    Radiology Reports  DG Chest Port 1 View Result Date: 08/13/2024 EXAM: 1 VIEW XRAY OF THE CHEST 08/13/2024 07:05:00 AM COMPARISON: 08/08/2024 CLINICAL HISTORY: [id number] SOB (shortness of breath) [id number]. Table formatting from the original note was not included.; Reason for exam: ; SOB (shortness of breath SOB (shortness of breath) FINDINGS: LUNGS AND PLEURA: Mild increase in interstitial markings are noted bilaterally. No airspace consolidation or pneumothorax. Small bilateral pleural effusions noted, similar to the previous exam. HEART AND MEDIASTINUM: No acute abnormality of the cardiac and mediastinal silhouettes. BONES AND SOFT TISSUES: No acute osseous abnormality. IMPRESSION: 1. Small bilateral pleural effusions, similar to the previous exam. 2. Mild increase in interstitial markings bilaterally, which may reflect mild edema. 3. No airspace consolidation or  pneumothorax. Electronically signed by: Waddell Calk MD 08/13/2024 07:24 AM EDT RP Workstation: [id number]      Signature  -   Lavada Stank M.D on 08/13/2024 at 8:40 AM   -  To page go to [email or web address]

## 2024-08-13 NOTE — Progress Notes (Signed)
 Physical Therapy Treatment Patient Details Name: Rachel Lucas MRN: [id number] DOB: [birth year] Today's Date: 08/13/2024   History of Present Illness Rachel Lucas is a 63 y.o. female who is transferred from [facility] ED with new diagnosed cirrhosis and likely alcoholic hepatitis. 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 Phx of hypothyroidism, HLD, alcohol use.    PT Comments  Pt tolerated treatment well today. Pt able to progress ambulation in hallway today with no AD at supervision level and navigate stairs with CGA. DC recs updated to no PT follow. PT will continue to follow acutely.     If plan is discharge home, recommend the following: A little help with walking and/or transfers;A little help with bathing/dressing/bathroom;Assistance with cooking/housework;Direct supervision/assist for medications management;Assist for transportation;Help with stairs or ramp for entrance;Supervision due to cognitive status   Can travel by private vehicle        Equipment Recommendations  Rolling walker (2 wheels)    Recommendations for Other Services       Precautions / Restrictions Precautions Precautions: Fall Recall of Precautions/Restrictions: Intact Restrictions Weight Bearing Restrictions Per Provider Order: No     Mobility  Bed Mobility Overal bed mobility: Modified Independent                  Transfers Overall transfer level: Needs assistance Equipment used: None Transfers: Sit to/from Stand Sit to Stand: Supervision           General transfer comment: Supervision for safety due to impulsivity along with assistance with lines and leads.    Ambulation/Gait Ambulation/Gait assistance: Contact guard assist, Supervision Gait Distance (Feet): 400 Feet Assistive device: None Gait Pattern/deviations: Drifts right/left Gait velocity: decreased     General Gait Details: Pt much more steady today  compared to previous session. Occasional drift noted requiring CGA to correct otherwise supervision.   Stairs Stairs: Yes Stairs assistance: Contact guard assist Stair Management: Alternating pattern, No rails, Forwards Number of Stairs: 3 General stair comments: no LOB noted. Impulsive   Wheelchair Mobility     Tilt Bed    Modified Rankin (Stroke Patients Only)       Balance Overall balance assessment: Mild deficits observed, not formally tested                                          Communication Communication Communication: No apparent difficulties  Cognition Arousal: Alert Behavior During Therapy: WFL for tasks assessed/performed   PT - Cognitive impairments: Problem solving, Sequencing                       PT - Cognition Comments: Some slow processing noted. Following commands: Intact      Cueing Cueing Techniques: Verbal cues, Tactile cues  Exercises      General Comments General comments (skin integrity, edema, etc.): VSS on RA      Pertinent Vitals/Pain Pain Assessment Pain Assessment: No/denies pain    Home Living                          Prior Function            PT Goals (current goals can now be found in the care plan section) Progress towards PT goals: Progressing toward goals  Frequency    Min 2X/week      PT Plan      Co-evaluation              AM-PAC PT 6 Clicks Mobility   Outcome Measure  Help needed turning from your back to your side while in a flat bed without using bedrails?: None Help needed moving from lying on your back to sitting on the side of a flat bed without using bedrails?: None Help needed moving to and from a bed to a chair (including a wheelchair)?: A Little Help needed standing up from a chair using your arms (e.g., wheelchair or bedside chair)?: A Little Help needed to walk in hospital room?: A Little Help needed climbing 3-5 steps with a railing? : A  Little 6 Click Score: 20    End of Session Equipment Utilized During Treatment: Gait belt Activity Tolerance: Patient tolerated treatment well Patient left: in bed;with call bell/phone within reach;with bed alarm set;with family/visitor present Nurse Communication: Mobility status PT Visit Diagnosis: Other abnormalities of gait and mobility (R26.89)     Time: 8850-8797 PT Time Calculation (min) (ACUTE ONLY): 13 min  Charges:    $Gait Training: 8-22 mins PT General Charges $$ ACUTE PT VISIT: 1 Visit                    Rachel Lucas, PT, DPT Acute Rehab Services [phone]    Rachel Lucas 08/13/2024, 4:26 PM

## 2024-08-13 NOTE — Progress Notes (Signed)
 Subjective: No significant left ear bleeding overnight.   Objective: Vital signs in last 24 hours: Temp:  [97.4 F (36.3 C)-99.3 F (37.4 C)] 97.4 F (36.3 C) (08/25 0428) Pulse Rate:  [72-92] 76 (08/25 0428) Resp:  [15-24] 16 (08/25 0428) BP: (89-112)/(60-76) 112/76 (08/25 0428) SpO2:  [92 %-98 %] 94 % (08/25 0428) Weight:  [56.6 kg] 56.6 kg (08/25 0440)  Exam: Eyes: Pupils are equal, round, reactive to light. Extraocular motion is intact.  Ears: Examination of the ears shows the Xeroform packing in place. Nose: Nasal examination shows normal mucosa, septum, turbinates.  Face: Facial examination shows no asymmetry. Palpation of the face elicit no significant tenderness.  Mouth: Oral cavity examination shows no mucosal abnormalities. Neck: Palpation of the neck reveals no lymphadenopathy or mass. The trachea is midline.  Neuro: Cranial nerves 2-12 are all grossly in tact.   Recent Labs    08/12/24 0045 08/12/24 0548  WBC 6.7 8.2  HGB 8.5* 10.7*  HCT 23.9* 30.0*  PLT 43* 73*   Recent Labs    08/12/24 0548 08/13/24 0557  NA 128* 130*  K 3.0* 3.4*  CL 94* 99  CO2 25 25  GLUCOSE 85 120*  BUN <5* <5*  CREATININE 0.81 0.65  CALCIUM 7.5* 7.7*    Medications: I have reviewed the patient's current medications. Scheduled:  sodium chloride    Intravenous Once   acetaminophen   650 mg Oral Once   ciprofloxacin -dexamethasone   4 drop Left EAR BID   feeding supplement  237 mL Oral BID BM   folic acid   2 mg Oral Daily   lactulose   10 g Oral BID   levothyroxine   88 mcg Oral QAC breakfast   midodrine   5 mg Oral TID WC   multivitamin with minerals  1 tablet Oral Daily   pantoprazole  (PROTONIX ) IV  40 mg Intravenous Q12H   prednisoLONE   40 mg Oral Daily   sodium chloride  flush  3 mL Intravenous Q12H   thiamine   100 mg Oral Daily   Or   thiamine   100 mg Intravenous Daily   thrombin   20,000 Units Topical Once   Continuous:  cefTRIAXone  (ROCEPHIN )  IV Stopped (08/12/24 2025)     Assessment/Plan: Left ear bleeding, secondary to Q-tip trauma and coagulopathic state. - Bleeding now controlled with xeroform and surgicel packing. - May remove Glasscock dressing. - Leave Xeroform packing in place.  Xeroform packing may be removed in 7 to 10 days as an outpatient. - Change cottonball as needed. - The patient may follow-up with Dr. Tobie as an outpatient.   LOS: 5 days   Rachel Lucas 08/13/2024, 7:28 AM

## 2024-08-13 NOTE — Plan of Care (Signed)
  Problem: Education: Goal: Knowledge of General Education information will improve Description: Including pain rating scale, medication(s)/side effects and non-pharmacologic comfort measures Outcome: Progressing   Problem: Health Behavior/Discharge Planning: Goal: Ability to manage health-related needs will improve Outcome: Progressing   Problem: Clinical Measurements: Goal: Ability to maintain clinical measurements within normal limits will improve Outcome: Progressing Goal: Diagnostic test results will improve Outcome: Progressing Goal: Respiratory complications will improve Outcome: Progressing Goal: Cardiovascular complication will be avoided Outcome: Progressing   Problem: Activity: Goal: Risk for activity intolerance will decrease Outcome: Progressing   Problem: Nutrition: Goal: Adequate nutrition will be maintained Outcome: Progressing   Problem: Coping: Goal: Level of anxiety will decrease Outcome: Progressing   Problem: Pain Managment: Goal: General experience of comfort will improve and/or be controlled Outcome: Progressing   Problem: Safety: Goal: Ability to remain free from injury will improve Outcome: Progressing   Problem: Skin Integrity: Goal: Risk for impaired skin integrity will decrease Outcome: Progressing

## 2024-08-14 DIAGNOSIS — K7030 Alcoholic cirrhosis of liver without ascites: Secondary | ICD-10-CM

## 2024-08-14 DIAGNOSIS — D696 Thrombocytopenia, unspecified: Secondary | ICD-10-CM | POA: Diagnosis not present

## 2024-08-14 DIAGNOSIS — H9222 Otorrhagia, left ear: Secondary | ICD-10-CM | POA: Diagnosis not present

## 2024-08-14 DIAGNOSIS — K7290 Hepatic failure, unspecified without coma: Secondary | ICD-10-CM | POA: Diagnosis not present

## 2024-08-14 DIAGNOSIS — I8500 Esophageal varices without bleeding: Secondary | ICD-10-CM

## 2024-08-14 DIAGNOSIS — K7010 Alcoholic hepatitis without ascites: Secondary | ICD-10-CM | POA: Diagnosis not present

## 2024-08-14 DIAGNOSIS — Z7189 Other specified counseling: Secondary | ICD-10-CM | POA: Diagnosis not present

## 2024-08-14 DIAGNOSIS — Z515 Encounter for palliative care: Secondary | ICD-10-CM | POA: Diagnosis not present

## 2024-08-14 LAB — COMPREHENSIVE METABOLIC PANEL WITH GFR
ALT: 45 U/L — ABNORMAL HIGH (ref 0–44)
AST: 125 U/L — ABNORMAL HIGH (ref 15–41)
Albumin: 2.4 g/dL — ABNORMAL LOW (ref 3.5–5.0)
Alkaline Phosphatase: 111 U/L (ref 38–126)
Anion gap: 10 (ref 5–15)
BUN: 8 mg/dL (ref 6–20)
CO2: 25 mmol/L (ref 22–32)
Calcium: 8.4 mg/dL — ABNORMAL LOW (ref 8.9–10.3)
Chloride: 96 mmol/L — ABNORMAL LOW (ref 98–111)
Creatinine, Ser: 0.67 mg/dL (ref 0.44–1.00)
GFR, Estimated: >60 mL/min (ref >60)
Glucose, Bld: 114 mg/dL — ABNORMAL HIGH (ref 70–99)
Potassium: 3.8 mmol/L (ref 3.5–5.1)
Sodium: 131 mmol/L — ABNORMAL LOW (ref 135–145)
Total Bilirubin: 7 mg/dL — ABNORMAL HIGH (ref 0.0–1.2)
Total Protein: 6.4 g/dL — ABNORMAL LOW (ref 6.5–8.1)

## 2024-08-14 LAB — PROTIME-INR
INR: 2 — ABNORMAL HIGH (ref 0.8–1.2)
Prothrombin Time: 23.3 s — ABNORMAL HIGH (ref 11.4–15.2)

## 2024-08-14 LAB — CBC WITH DIFFERENTIAL/PLATELET
Abs Immature Granulocytes: 0.12 K/uL — ABNORMAL HIGH (ref 0.00–0.07)
Basophils Absolute: 0 K/uL (ref 0.0–0.1)
Basophils Relative: 0 %
Eosinophils Absolute: 0 K/uL (ref 0.0–0.5)
Eosinophils Relative: 0 %
HCT: 29.6 % — ABNORMAL LOW (ref 36.0–46.0)
Hemoglobin: 10.3 g/dL — ABNORMAL LOW (ref 12.0–15.0)
Immature Granulocytes: 1 %
Lymphocytes Relative: 8 %
Lymphs Abs: 0.8 K/uL (ref 0.7–4.0)
MCH: 36.1 pg — ABNORMAL HIGH (ref 26.0–34.0)
MCHC: 34.8 g/dL (ref 30.0–36.0)
MCV: 103.9 fL — ABNORMAL HIGH (ref 80.0–100.0)
Monocytes Absolute: 1.5 K/uL — ABNORMAL HIGH (ref 0.1–1.0)
Monocytes Relative: 14 %
Neutro Abs: 7.8 K/uL — ABNORMAL HIGH (ref 1.7–7.7)
Neutrophils Relative %: 77 %
Platelets: 74 K/uL — ABNORMAL LOW (ref 150–400)
RBC: 2.85 MIL/uL — ABNORMAL LOW (ref 3.87–5.11)
RDW: 21.2 % — ABNORMAL HIGH (ref 11.5–15.5)
WBC: 10.2 K/uL (ref 4.0–10.5)
nRBC: 0 % (ref 0.0–0.2)

## 2024-08-14 LAB — MAGNESIUM: Magnesium: 2 mg/dL (ref 1.7–2.4)

## 2024-08-14 LAB — HEMOCHROMATOSIS DNA-PCR(C282Y,H63D)

## 2024-08-14 MED ORDER — PROPRANOLOL HCL 10 MG PO TABS
10.0000 mg | ORAL_TABLET | Freq: Two times a day (BID) | ORAL | Status: DC
  Administered 2024-08-14 – 2024-08-15 (×3): 10 mg via ORAL
  Filled 2024-08-14 (×3): qty 1

## 2024-08-14 MED ORDER — PANTOPRAZOLE SODIUM 40 MG PO TBEC
40.0000 mg | DELAYED_RELEASE_TABLET | Freq: Two times a day (BID) | ORAL | Status: DC
  Administered 2024-08-14 – 2024-08-15 (×3): 40 mg via ORAL
  Filled 2024-08-14 (×3): qty 1

## 2024-08-14 NOTE — Progress Notes (Signed)
 Physical Therapy Treatment Patient Details Name: Rachel Lucas MRN: [id number] DOB: September 04, 1961 Today's Date: 08/14/2024   History of Present Illness Rachel Lucas is a 63 y.o. female who is transferred from [facility] ED with new diagnosed cirrhosis and likely alcoholic hepatitis. 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 Phx of hypothyroidism, HLD, alcohol use.    PT Comments  Pt tolerated treatment well today. Pt today was able ambulate in hallway independently and navigate stairs with supervision. Pt presents at or near baseline mobility. Pt has no further acute PT needs and will be signing off. Re consult PT if mobility status changes. Pt would benefit from continued mobility with mobility specialist during acute stay.     If plan is discharge home, recommend the following: A little help with walking and/or transfers;A little help with bathing/dressing/bathroom;Assistance with cooking/housework;Direct supervision/assist for medications management;Assist for transportation;Help with stairs or ramp for entrance;Supervision due to cognitive status   Can travel by private vehicle        Equipment Recommendations  Rolling walker (2 wheels)    Recommendations for Other Services       Precautions / Restrictions Precautions Precautions: Fall Recall of Precautions/Restrictions: Intact Restrictions Weight Bearing Restrictions Per Provider Order: No     Mobility  Bed Mobility Overal bed mobility: Modified Independent                  Transfers Overall transfer level: Modified independent Equipment used: None Transfers: Sit to/from Stand Sit to Stand: Modified independent (Device/Increase time)                Ambulation/Gait Ambulation/Gait assistance: Independent Gait Distance (Feet): 400 Feet Assistive device: None Gait Pattern/deviations: Step-through pattern Gait velocity: decreased      General Gait Details: no LOB noted.   Stairs   Stairs assistance: Supervision Stair Management: Alternating pattern, No rails, Forwards Number of Stairs: 10 General stair comments: no LOB noted. Impulsive   Wheelchair Mobility     Tilt Bed    Modified Rankin (Stroke Patients Only)       Balance Overall balance assessment: Mild deficits observed, not formally tested                                          Communication Communication Communication: No apparent difficulties  Cognition Arousal: Alert Behavior During Therapy: WFL for tasks assessed/performed   PT - Cognitive impairments: Problem solving, Sequencing                       PT - Cognition Comments: Some slow processing noted. Following commands: Intact      Cueing Cueing Techniques: Verbal cues, Tactile cues  Exercises      General Comments        Pertinent Vitals/Pain Pain Assessment Pain Assessment: No/denies pain    Home Living                          Prior Function            PT Goals (current goals can now be found in the care plan section) Progress towards PT goals: Goals met/education completed, patient discharged from PT    Frequency    Min 2X/week      PT Plan  Co-evaluation              AM-PAC PT 6 Clicks Mobility   Outcome Measure  Help needed turning from your back to your side while in a flat bed without using bedrails?: None Help needed moving from lying on your back to sitting on the side of a flat bed without using bedrails?: None Help needed moving to and from a bed to a chair (including a wheelchair)?: A Little Help needed standing up from a chair using your arms (e.g., wheelchair or bedside chair)?: A Little Help needed to walk in hospital room?: A Little Help needed climbing 3-5 steps with a railing? : A Little 6 Click Score: 20    End of Session Equipment Utilized During Treatment: Gait belt Activity  Tolerance: Patient tolerated treatment well Patient left: in bed;with call bell/phone within reach;with bed alarm set;with family/visitor present Nurse Communication: Mobility status PT Visit Diagnosis: Other abnormalities of gait and mobility (R26.89)     Time: 8386-8371 PT Time Calculation (min) (ACUTE ONLY): 15 min  Charges:    $Gait Training: 8-22 mins PT General Charges $$ ACUTE PT VISIT: 1 Visit                     Sueellen NOVAK, PT, DPT Acute Rehab Services [phone]    Kami Kube 08/14/2024, 6:07 PM

## 2024-08-14 NOTE — Plan of Care (Signed)
  Problem: Education: Goal: Knowledge of General Education information will improve Description: Including pain rating scale, medication(s)/side effects and non-pharmacologic comfort measures Outcome: Progressing   Problem: Health Behavior/Discharge Planning: Goal: Ability to manage health-related needs will improve Outcome: Progressing   Problem: Clinical Measurements: Goal: Ability to maintain clinical measurements within normal limits will improve Outcome: Progressing Goal: Will remain free from infection Outcome: Progressing Goal: Diagnostic test results will improve Outcome: Progressing Goal: Respiratory complications will improve Outcome: Progressing   Problem: Activity: Goal: Risk for activity intolerance will decrease Outcome: Progressing   Problem: Nutrition: Goal: Adequate nutrition will be maintained Outcome: Progressing   Problem: Coping: Goal: Level of anxiety will decrease Outcome: Progressing   Problem: Elimination: Goal: Will not experience complications related to bowel motility Outcome: Progressing Goal: Will not experience complications related to urinary retention Outcome: Progressing   Problem: Pain Managment: Goal: General experience of comfort will improve and/or be controlled Outcome: Progressing   Problem: Safety: Goal: Ability to remain free from injury will improve Outcome: Progressing   Problem: Skin Integrity: Goal: Risk for impaired skin integrity will decrease Outcome: Progressing

## 2024-08-14 NOTE — Progress Notes (Signed)
   08/14/24 0927  Mobility  Activity Ambulated independently  Level of Assistance Standby assist, set-up cues, supervision of patient - no hands on  Assistive Device None  Distance Ambulated (ft) 400 ft  Activity Response Tolerated fair  Mobility Referral Yes  Mobility visit 1 Mobility  Mobility Specialist Start Time (ACUTE ONLY) [phone]  Mobility Specialist Stop Time (ACUTE ONLY) 0933  Mobility Specialist Time Calculation (min) (ACUTE ONLY) 6 min   Mobility Specialist: Progress Note  During Mobility: HR 116  Post-Mobility:    HR 101, SpO2 98% RA  Pt agreeable to mobility session - received in bed. C/o R sided pain when standing happens at times - none during this occurrence.  Returned to bed with all needs met - call bell within reach. Husband present.   Virgle Boards, BS Mobility Specialist Please contact via SecureChat or  Rehab office at [phone].

## 2024-08-14 NOTE — Progress Notes (Signed)
 Palliative:  HPI: 63 y.o. female  with past medical history of hypothyroidism, HLD, recently diagnosed admitted on 08/08/2024 with L ear bleeding and jaundice found to have decompensated liver cirrhosis, alcohol-related hepatitis, and Klebsiella bacteremia.   I met again today with Rachel Lucas and husband, Rachel Lucas, at bedside. Rachel Lucas is in better spirits today. She feels much more herself after having a shower and having her hair cleaned. She is hoping that she may be able to return home as early as tomorrow. Her appetite has improved and she looks forward to making changes to a healthier lifestyle when she returns home. We discussed no alcohol and being careful of medications to limit toxins to the liver. We were able to discuss further palliative care as a support and assistance to optimize QOL. We discussed palliative care as a help if health declines further in assisting with difficult decisions as well. We reviewed the importance of Advance Directive and they both agree they need to update. I brought them two copies of Advance Directives to review and complete. I did mention that there has also been mention of connecting with [facility] and their transplant team. They seem surprised and share that they hope it doesn't get that far. I did explain that if this is recommended them it is good to keep all options in mind. They express understanding.   All questions/concerns addressed. Emotional support provided.   Exam: Alert, oriented. Good spirits today. No distress. Breathing regular, unlabored. Abd soft. No further bleeding from L ear. Moves all extremities.   Plan: - Full code, full scope - Hopeful for ongoing improvement and maintenance of liver disease  45 min  Bernarda Kitty, NP Palliative Medicine Team Pager [phone] (Please see [email or web address] for schedule) Team Phone [phone]

## 2024-08-14 NOTE — Progress Notes (Signed)
 Progress Note   Subjective  Patient doing much better today - ambulating. Eating well. States she has not eaten this well in months.    Objective   Vital signs in last 24 hours: Temp:  [97.3 F (36.3 C)-98.3 F (36.8 C)] 97.3 F (36.3 C) (08/26 0923) Pulse Rate:  [70-88] 85 (08/26 0923) Resp:  [14-20] 16 (08/26 0923) BP: (90-126)/(61-84) 126/80 (08/26 0900) SpO2:  [92 %-98 %] 98 % (08/26 0923) Weight:  [60.5 kg] 60.5 kg (08/26 0622) Last BM Date : 08/14/24 General:    white female in NAD, jaundiced Neurologic:  Alert and oriented,  grossly normal neurologically. Psych:  Cooperative. Normal mood and affect.  Intake/Output from previous day: 08/25 0701 - 08/26 0700 In: 963 [P.O.:960; I.V.:3] Out: -  Intake/Output this shift: No intake/output data recorded.  Lab Results: Recent Labs    08/12/24 0548 08/13/24 0557 08/14/24 0527  WBC 8.2 5.9 10.2  HGB 10.7* 9.4* 10.3*  HCT 30.0* 27.1* 29.6*  PLT 73* 49* 74*   BMET Recent Labs    08/12/24 0548 08/13/24 0557 08/14/24 0527  NA 128* 130* 131*  K 3.0* 3.4* 3.8  CL 94* 99 96*  CO2 25 25 25   GLUCOSE 85 120* 114*  BUN <5* <5* 8  CREATININE 0.81 0.65 0.67  CALCIUM 7.5* 7.7* 8.4*   LFT Recent Labs    08/12/24 0548 08/13/24 0557 08/14/24 0527  PROT 7.6   < > 6.4*  ALBUMIN  2.3*   < > 2.4*  AST 219*   < > 125*  ALT 60*   < > 45*  ALKPHOS 94   < > 111  BILITOT 9.3*   < > 7.0*  BILIDIR 4.6*  --   --   IBILI 4.7*  --   --    < > = values in this interval not displayed.   PT/INR Recent Labs    08/13/24 0557 08/14/24 0527  LABPROT 24.4* 23.3*  INR 2.1* 2.0*   MELD 3.0: 27 at 08/14/2024  5:27 AM MELD-Na: 26 at 08/14/2024  5:27 AM Calculated from: Serum Creatinine: 0.67 mg/dL (Using min of 1 mg/dL) at 1/73/7974  4:72 AM Serum Sodium: 131 mmol/L at 08/14/2024  5:27 AM Total Bilirubin: 7 mg/dL at 1/73/7974  4:72 AM Serum Albumin : 2.4 g/dL at 1/73/7974  4:72 AM INR(ratio): 2 at 08/14/2024  5:27 AM Age  at listing (hypothetical): 62 years Sex: Female at 08/14/2024  5:27 AM       Assessment / Plan:    63 y/o female here with the following:   Decompensated cirrhosis Alcoholic hepatitis Klebsiella / enterobacter bacteremia -  source, UTI vs. 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  Newly diagnosed cirrhosis with alcoholic hepatitis, DF > 32, MELD 27. Infection treated, started on prednisolone , now day #3. Stable at this time. INR and bili slightly trending in right direction.   We have discussed her course to date, severity of liver disease. The hope is that with time off alcohol she can recover from this, counseled this process will take weeks to months. They seem very motivated to maintain alcohol abstinence.    I reviewed CT changes with them, concern for esophageal varices. We discussed possible EGD to screen for varices. She is coagulopathic with thrombocytopenia but platelets now above 50 and rising which is good. She raises the concern historically that she has Von Willebrands with easy bruising / bleeding in the past, lab has been sent for this but will  take a few weeks to come back. If she truly has this, at much higher risk for variceal bleeding. Discussed if she would want to do a diagnostic EGD as inpatient to screen for varices, or consider empiric low dose beta blocker to see if she would tolerate it and treat empirically for varices, which I suspect she has based on the CT. She is rather anxious about having an EGD without knowing if she has Von Willebrands. I spoke with hematology who thought we could safely do a diagnostic exam but her preference is to hold off on EGD right now and do this in upcoming weeks as outpatient to allow more time to recover and to clarify if she has Von Willebrands.   On the other hand, while CT shows varices, we don't know her true risk for bleeding without doing an EGD. We discussed if she wanted to try an empiric low dose of beta blocker in  light of her bleeding risks. She is on midodrine  for BP but her pressure is 120/80 today on rounds. Discussed with Dr. Dennise, will try her on propranolol  10mg  BID to start and see how she tolerates it, would have less affect on her BP than Coreg. She wants to try this today and if tolerates will continue as outpatient.  Otherwise, continue prednisolone , check Lille score at day 7, complete course of antibiotics. Continue low Na diet, she is eating well. Anticipate home tomorrow should she continue to do well.  PLAN: - continue prednisolone , repeat Lille score at day 7 - complete course of antibiotics - continue low NA diet - monitor for recurrent bleeding - checking INR, LFTs daily - continue lactulose  - complete alcohol abstinence - trial of propranolol  10mg  BID to see if tolerated. As above, she wishes to defer EGD until hematology workup done - workup for VonWillebrands per hematology as outpatient, lab sent, will not be back for a few weeks - possible discharge home tomorrow    Will reassess her tomorrow, call with questions in the interim.  Marcey Naval, MD [facility]

## 2024-08-14 NOTE — Plan of Care (Signed)

## 2024-08-14 NOTE — Progress Notes (Signed)
 PROGRESS NOTE        PATIENT DETAILS Name: Rachel Lucas Age: 63 y.o. Sex: female Date of Birth: 1961-10-26 Admit Date: 08/08/2024 Admitting Physician Mauricio Toribio Furnace, MD ERE:[id number], No Pcp Per  Brief Summary: Patient is a 63 y.o.  female with history of hypothyroidism-longstanding EtOH use-presented with left ear bleeding (Q-tip injury)-upon further evaluation-she was found to have new diagnosis of liver cirrhosis (apparently was told by her chiropractor that she looked yellow approximately 2 weeks back) and Klebsiella bacteremia.  Significant events: 8/20>> admit to [facility]  Significant studies: 8/20>> CT abdomen/pelvis: Consistent with micronodular hepatic cirrhosis/portal venous hypertension with gastroesophageal varices.  Moderate ascites. 8/20>> CT temporal bones: Large amount of external material at the opening of the left external auditory canal-complete opacification of the canal.  Significant microbiology data: 8/20>> blood culture: Klebsiella pneumoniae  Procedures: None  Consults: GI ENT Palliative care  Subjective:  Patient in bed, appears comfortable, denies any headache, no fever, no chest pain or pressure, no shortness of breath , no abdominal pain. No new focal weakness.    Objective: Vitals: Blood pressure 111/74, pulse 88, temperature 97.7 F (36.5 C), temperature source Oral, resp. rate 18, height 5' 3 (1.6 m), weight 60.5 kg, SpO2 95%.   Exam:  Severely weak and cachectic Caucasian female, awake Alert, No new F.N deficits, severe icterus, left earlobe under bandage,  [place].AT,PERRAL Supple Neck, No JVD,   Symmetrical Chest wall movement, Good air movement bilaterally, CTAB RRR,No Gallops, Rubs or new Murmurs,  +ve B.Sounds, Abd Soft, No tenderness,   No Cyanosis, Clubbing or edema    Assessment/Plan:  Left ear bleeding Secondary Q-tip trauma to canal ENT following-Glasscock dressing removed 8/22-on Ciprodex   4 drops twice daily x 2 weeks Seen by ENT Dr. ROSALEA on 08/12/2024, given her FFP and vitamin K  on 08/12/2024 as likely ongoing bleeding in the setting of ESLD is due to that, H&H and platelets have improved will continue to monitor, bleeding has now resolved, appreciate ENT input  Klebsiella bacteremia Unclear whether this is from left ear issues or from UTI (UA grossly abnormal-mild dysuria).  No significant ascites on IR evaluation for paracentesis-hence unlikely to be SBP related bacteremia. Urine cultures ordered 8/21-never sent-doubt of any utility at this point CT abdomen/pelvis without any major urologic/biliary issues Continue IV Rocephin -since slowly improving-will plan on transition to oral antibiotic in the next day or so.  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  Alcoholic hepatitis Transaminase elevation pattern consistent with alcoholic hepatitis Discriminant function more than 32-  GI on board on prednisolone , midodrine , as needed albumin , once blood pressure and renal function stable then diuretics as tolerated. Counseled to abstain from alcohol.  ?  1 episode of hematochezia at med center Patient denies any hematochezia at med center-this was apparently seen by RN-no further hematochezia episodes since hospitalization Hb relatively stable Has evidence of portal hypertension/varices by CT imaging-GI planning endoscopy at some point in the future-possibly as an outpatient.  Normocytic anemia Likely secondary to underlying liver cirrhosis/folic acid   deficiency No active  GI bleeding noted Hb stable and actually improving Continue folic acid  supplementation LDH minimally elevated-no clinical evidence of hemolysis.  Bilirubin is elevated due to liver cirrhosis-pattern of bilirubin elevation is also not consistent with hemolysis. Follow CBC.  Hyponatremia Is intravascularly dehydrated, diuretics held, s/p albumin  on 08/12/2024 per GI with improvement.  Continue to monitor, continue midodrine , blood pressure also stabilizing  Hypokalemia/hypomagnesemia/hypophosphatemia Due to EtOH use Continue to replete/recheck.  EtOH use Last drink per patient on 8/16, counseled to abstain completely from alcohol use. Would be out of window for any withdrawal symptoms Counseled extensively regarding stopping any further alcohol use going forward.  Hypothyroidism TSH stable Synthroid   Code status:   Code Status: Full Code   DVT Prophylaxis: SCDs Start: 08/08/24 2113   Family Communication: None at bedside   Disposition Plan: Status is: Inpatient Remains inpatient appropriate because: Severity of illness   Planned Discharge Destination:Home   Diet: Diet Order             Diet 2 gram sodium Room service appropriate? Yes with Assist; Fluid consistency: Thin  Diet effective now                     Data Review:    Data Review:   Inpatient Medications  Scheduled Meds:  ciprofloxacin -dexamethasone   4 drop Left EAR BID   feeding supplement  237 mL Oral BID BM   folic acid   2 mg Oral Daily   lactulose   10 g Oral BID   levothyroxine   88 mcg Oral QAC breakfast   midodrine   5 mg Oral TID WC   multivitamin with minerals  1 tablet Oral Daily   pantoprazole  (PROTONIX ) IV  40 mg Intravenous Q12H   prednisoLONE   40 mg Oral Daily   sodium chloride  flush  3 mL Intravenous Q12H   thiamine   100 mg Oral Daily   Or   thiamine   100 mg Intravenous Daily   Continuous Infusions:  cefTRIAXone  (ROCEPHIN )  IV 2 g (08/13/24 2002)   PRN  Meds:.albuterol , diazepam  **OR** diazepam , diphenhydrAMINE , melatonin, ondansetron  (ZOFRAN ) IV, mouth rinse, simethicone   DVT Prophylaxis  SCDs Start: 08/08/24 2113   Recent Labs  Lab 08/08/24 1325 08/08/24 2118 08/11/24 0943 08/12/24 0045 08/12/24 0548 08/13/24 0557 08/14/24 0527  WBC 9.8   < > 7.5 6.7 8.2 5.9 10.2  HGB 11.5*   < > 11.1* 8.5* 10.7* 9.4* 10.3*  HCT 32.0*   < > 31.4* 23.9* 30.0* 27.1* 29.6*  PLT 33*   < > 35* 43* 73* 49* 74*  MCV 105.6*   < > 106.8* 107.7* 107.9* 103.8* 103.9*  MCH 38.0*   < > 37.8* 38.3* 38.5* 36.0* 36.1*  MCHC 35.9   < > 35.4 35.6 35.7 34.7 34.8  RDW 15.7*   < > 15.4 15.5 15.7* 21.5* 21.2*  LYMPHSABS 0.7  --   --   --   --  0.4* 0.8  MONOABS 1.1*  --   --   --   --  0.6 1.5*  EOSABS 0.0  --   --   --   --  0.0 0.0  BASOSABS 0.0  --   --   --   --  0.0 0.0   < > = values in this interval not displayed.    Recent Labs  Lab 08/08/24 2118 08/09/24 0010 08/09/24 0518 08/10/24 0545 08/11/24 9056 08/12/24 0045 08/12/24 0548 08/13/24 0557 08/14/24 0527  NA 126*  --  126* 126* 125*  --  128* 130* 131*  K 3.6  --  3.1* 2.8* 3.0*  --  3.0* 3.4* 3.8  CL 92*  --  93* 94* 91*  --  94* 99 96*  CO2 20*  --  21* 25 24  --  25 25 25   ANIONGAP 14  --  12 7 10   --  9 6 10   GLUCOSE 123*  --  98 88 99  --  85 120* 114*  BUN 6  --  <5* <5* 5*  --  <5* <5* 8  CREATININE 0.66  --  0.76 0.68 0.80  --  0.81 0.65 0.67  AST 299*  --  255* 255* 266*  --  219* 126* 125*  ALT 64*  --  56* 60* 67*  --  60* 41 45*  ALKPHOS 113  --  89 90 98  --  94 74 111  BILITOT 7.2*  --  5.9* 6.0* 8.3*  --  9.3* 7.2* 7.0*  ALBUMIN  1.8*  --  1.7* 1.6* 1.7*  --  2.3* 2.5* 2.4*  LATICACIDVEN 3.1* 3.2* 2.5*  --   --   --   --   --   --   INR  --   --  1.7* 2.0* 2.3* 2.2* 1.8* 2.1* 2.0*  TSH 3.397  --   --   --   --   --   --   --   --   AMMONIA 57*  --   --   --   --   --   --   --   --   MG 1.5*  --  1.3* 1.9 1.8  --   --  2.0 2.0  PHOS 1.9*  --  1.8* 2.4* 2.6  --   --    --   --   CALCIUM 7.3*  --  7.2* 7.0* 7.2*  --  7.5* 7.7* 8.4*      Recent Labs  Lab 08/08/24 2118 08/09/24 0010 08/09/24 0518 08/10/24 0545 08/11/24 0943 08/12/24 0045 08/12/24 0548 08/13/24 0557 08/14/24 0527  LATICACIDVEN 3.1* 3.2* 2.5*  --   --   --   --   --   --   INR  --   --  1.7* 2.0* 2.3* 2.2* 1.8* 2.1* 2.0*  TSH 3.397  --   --   --   --   --   --   --   --   AMMONIA 57*  --   --   --   --   --   --   --   --   MG 1.5*  --  1.3* 1.9 1.8  --   --  2.0 2.0  CALCIUM 7.3*  --  7.2* 7.0* 7.2*  --  7.5* 7.7* 8.4*    Radiology Reports  DG Chest Port 1 View Result Date: 08/13/2024 EXAM: 1 VIEW XRAY OF THE CHEST 08/13/2024 07:05:00 AM COMPARISON: 08/08/2024 CLINICAL HISTORY: [id number] SOB (shortness of breath) [id number]. Table formatting from the original note was not included.; Reason for exam: ; SOB (shortness of breath SOB (shortness of breath) FINDINGS: LUNGS AND PLEURA: Mild increase in interstitial markings are noted bilaterally. No airspace consolidation or pneumothorax. Small bilateral pleural effusions noted, similar to the previous exam. HEART AND MEDIASTINUM: No acute abnormality of the cardiac and mediastinal silhouettes. BONES AND SOFT TISSUES: No acute osseous abnormality. IMPRESSION: 1. Small bilateral pleural effusions, similar to the previous exam. 2. Mild increase in interstitial markings bilaterally,  which may reflect mild edema. 3. No airspace consolidation or pneumothorax. Electronically signed by: Waddell Calk MD 08/13/2024 07:24 AM EDT RP Workstation: [id number]      Signature  -   Lavada Stank M.D on 08/14/2024 at 8:18 AM   -  To page go to [email or web address]

## 2024-08-15 ENCOUNTER — Other Ambulatory Visit (HOSPITAL_COMMUNITY): Payer: Self-pay

## 2024-08-15 ENCOUNTER — Telehealth (INDEPENDENT_AMBULATORY_CARE_PROVIDER_SITE_OTHER): Payer: Self-pay | Admitting: Otolaryngology

## 2024-08-15 DIAGNOSIS — K7011 Alcoholic hepatitis with ascites: Secondary | ICD-10-CM | POA: Diagnosis not present

## 2024-08-15 DIAGNOSIS — I8500 Esophageal varices without bleeding: Secondary | ICD-10-CM | POA: Diagnosis not present

## 2024-08-15 DIAGNOSIS — K7031 Alcoholic cirrhosis of liver with ascites: Secondary | ICD-10-CM | POA: Diagnosis not present

## 2024-08-15 DIAGNOSIS — K7290 Hepatic failure, unspecified without coma: Secondary | ICD-10-CM | POA: Diagnosis not present

## 2024-08-15 LAB — URINE CULTURE: Culture: NO GROWTH

## 2024-08-15 LAB — COMPREHENSIVE METABOLIC PANEL WITH GFR
ALT: 56 U/L — ABNORMAL HIGH (ref 0–44)
AST: 140 U/L — ABNORMAL HIGH (ref 15–41)
Albumin: 2.5 g/dL — ABNORMAL LOW (ref 3.5–5.0)
Alkaline Phosphatase: 118 U/L (ref 38–126)
Anion gap: 11 (ref 5–15)
BUN: 8 mg/dL (ref 6–20)
CO2: 25 mmol/L (ref 22–32)
Calcium: 8.5 mg/dL — ABNORMAL LOW (ref 8.9–10.3)
Chloride: 98 mmol/L (ref 98–111)
Creatinine, Ser: 0.63 mg/dL (ref 0.44–1.00)
GFR, Estimated: >60 mL/min (ref >60)
Glucose, Bld: 113 mg/dL — ABNORMAL HIGH (ref 70–99)
Potassium: 3.8 mmol/L (ref 3.5–5.1)
Sodium: 134 mmol/L — ABNORMAL LOW (ref 135–145)
Total Bilirubin: 7.1 mg/dL — ABNORMAL HIGH (ref 0.0–1.2)
Total Protein: 6.7 g/dL (ref 6.5–8.1)

## 2024-08-15 LAB — CBC WITH DIFFERENTIAL/PLATELET
Basophils Absolute: 0 K/uL (ref 0.0–0.1)
Basophils Relative: 0 %
Eosinophils Absolute: 0 K/uL (ref 0.0–0.5)
Eosinophils Relative: 0 %
HCT: 32.8 % — ABNORMAL LOW (ref 36.0–46.0)
Hemoglobin: 11.2 g/dL — ABNORMAL LOW (ref 12.0–15.0)
Lymphocytes Relative: 5 %
Lymphs Abs: 0.5 K/uL — ABNORMAL LOW (ref 0.7–4.0)
MCH: 36 pg — ABNORMAL HIGH (ref 26.0–34.0)
MCHC: 34.1 g/dL (ref 30.0–36.0)
MCV: 105.5 fL — ABNORMAL HIGH (ref 80.0–100.0)
Monocytes Absolute: 1 K/uL (ref 0.1–1.0)
Monocytes Relative: 10 %
Neutro Abs: 8.2 K/uL — ABNORMAL HIGH (ref 1.7–7.7)
Neutrophils Relative %: 85 %
Platelets: 110 K/uL — ABNORMAL LOW (ref 150–400)
RBC: 3.11 MIL/uL — ABNORMAL LOW (ref 3.87–5.11)
RDW: 21.6 % — ABNORMAL HIGH (ref 11.5–15.5)
WBC: 9.7 K/uL (ref 4.0–10.5)
nRBC: 0 % (ref 0.0–0.2)

## 2024-08-15 LAB — PROTIME-INR
INR: 1.9 — ABNORMAL HIGH (ref 0.8–1.2)
Prothrombin Time: 22.4 s — ABNORMAL HIGH (ref 11.4–15.2)

## 2024-08-15 LAB — COAG STUDIES INTERP REPORT

## 2024-08-15 LAB — VON WILLEBRAND PANEL
Coagulation Factor VIII: 335 % — ABNORMAL HIGH (ref 56–140)
Ristocetin Co-factor, Plasma: >300 % — ABNORMAL HIGH (ref 50–200)
Von Willebrand Antigen, Plasma: 125 % (ref 50–200)

## 2024-08-15 LAB — MAGNESIUM: Magnesium: 1.9 mg/dL (ref 1.7–2.4)

## 2024-08-15 MED ORDER — MIDODRINE HCL 5 MG PO TABS
5.0000 mg | ORAL_TABLET | Freq: Two times a day (BID) | ORAL | 0 refills | Status: DC
  Filled 2024-08-15: qty 60, 30d supply, fill #0

## 2024-08-15 MED ORDER — PROPRANOLOL HCL 10 MG PO TABS
5.0000 mg | ORAL_TABLET | Freq: Two times a day (BID) | ORAL | 0 refills | Status: DC
  Filled 2024-08-15: qty 30, 30d supply, fill #0

## 2024-08-15 MED ORDER — LACTULOSE 10 GM/15ML PO SOLN
10.0000 g | Freq: Two times a day (BID) | ORAL | 0 refills | Status: DC
Start: 2024-08-15 — End: 2024-08-21
  Filled 2024-08-15: qty 237, 8d supply, fill #0

## 2024-08-15 MED ORDER — CEPHALEXIN 500 MG PO CAPS
500.0000 mg | ORAL_CAPSULE | Freq: Three times a day (TID) | ORAL | 0 refills | Status: AC
  Filled 2024-08-15: qty 12, 4d supply, fill #0

## 2024-08-15 MED ORDER — LEVOTHYROXINE SODIUM 88 MCG PO TABS
88.0000 ug | ORAL_TABLET | Freq: Every day | ORAL | 1 refills | Status: DC
  Filled 2024-08-15: qty 30, 30d supply, fill #0

## 2024-08-15 MED ORDER — THIAMINE HCL 100 MG PO TABS
100.0000 mg | ORAL_TABLET | Freq: Every day | ORAL | 0 refills | Status: DC
  Filled 2024-08-15: qty 30, 30d supply, fill #0

## 2024-08-15 MED ORDER — NEOMYCIN-POLYMYXIN-DEXAMETH 3.5-10000-0.1 OP SUSP
4.0000 [drp] | Freq: Three times a day (TID) | OPHTHALMIC | 0 refills | Status: DC
Start: 2024-08-15 — End: 2024-08-30
  Filled 2024-08-15: qty 5, 7d supply, fill #0

## 2024-08-15 MED ORDER — FOLIC ACID 1 MG PO TABS
1.0000 mg | ORAL_TABLET | Freq: Every day | ORAL | 0 refills | Status: DC
  Filled 2024-08-15: qty 30, 30d supply, fill #0

## 2024-08-15 MED ORDER — PREDNISONE 20 MG PO TABS
40.0000 mg | ORAL_TABLET | Freq: Every day | ORAL | 0 refills | Status: DC
  Filled 2024-08-15: qty 48, 24d supply, fill #0

## 2024-08-15 NOTE — Plan of Care (Signed)

## 2024-08-15 NOTE — Telephone Encounter (Signed)
 Sending note to Dr Tobie,  per Dr Karis.  The patient's husband only spoke of Dr Karis on the phone call.

## 2024-08-15 NOTE — TOC Transition Note (Signed)
 Transition of Care [facility]) - Discharge Note   Patient Details  Name: Rachel Lucas MRN: [id number] Date of Birth: Mar 28, 1961  [facility] CM/SW Contact:  Marval Gell, RN Phone Number: 08/15/2024, 9:47 AM   Clinical Narrative:     [name] w spouse at bedside, discussed private duty care, left with brochures.  No other ICM needs for DC   Final next level of care: Home/Self Care Barriers to Discharge: No Barriers Identified   Patient Goals and CMS Choice Patient states their goals for this hospitalization and ongoing recovery are:: to go home          Discharge Placement                       Discharge Plan and Services Additional resources added to the After Visit Summary for                  DME Arranged: N/A           HH Agency: NA        Social Drivers of Health (SDOH) Interventions SDOH Screenings   Food Insecurity: No Food Insecurity (08/08/2024)  Housing: Low Risk  (08/08/2024)  Transportation Needs: No Transportation Needs (08/08/2024)  Utilities: Not At Risk (08/08/2024)  Tobacco Use: Medium Risk (08/08/2024)     Readmission Risk Interventions    08/13/2024    5:06 PM  Readmission Risk Prevention Plan  Transportation Screening Complete  PCP or Specialist Appt within 5-7 Days Complete  Home Care Screening Complete  Medication Review (RN CM) Referral to Pharmacy

## 2024-08-15 NOTE — Discharge Summary (Signed)
 Rachel Lucas FMW:[id number] DOB: [birth year] DOA: 08/08/2024  PCP: Patient, No Pcp Per  Admit date: 08/08/2024  Discharge date: 08/15/2024  Admitted From: Home   Disposition:  Home   Recommendations for Outpatient Follow-up:   Follow up with PCP in 1-2 weeks  PCP Please obtain BMP/CBC, 2 view CXR in 1week,  (see Discharge instructions)   PCP Please follow up on the following pending results: Mildly arrange for outpatient GI, ENT and hematology follow-up.  Follow-up on the pending results of von Willebrand panel.   Home Health: None   Equipment/Devices: None  Consultations: GI, hematology Dr. Emery, ENT Discharge Condition: Stable     CODE STATUS: Full     Diet Recommendation: Heart Healthy      Chief Complaint  Patient presents with   Ear Bleeding   Jaundice     Brief history of present illness from the day of admission and additional interim summary    63 y.o.  female with history of hypothyroidism-longstanding EtOH use-presented with left ear bleeding (Q-tip injury)-upon further evaluation-she was found to have new diagnosis of liver cirrhosis (apparently was told by her chiropractor that she looked yellow approximately 2 weeks back) and Klebsiella bacteremia.   Significant events: 8/20>> admit to [facility]   Significant studies: 8/20>> CT abdomen/pelvis: Consistent with micronodular hepatic cirrhosis/portal venous hypertension with gastroesophageal varices.  Moderate ascites. 8/20>> CT temporal bones: Large amount of external material at the opening of the left external auditory canal-complete opacification of the canal.   Significant microbiology data: 8/20>> blood culture: Klebsiella pneumoniae                                                                 Hospital Course   Left ear  bleeding Secondary Q-tip trauma to canal ENT following-Glasscock dressing removed 8/22-on Ciprodex  4 drops twice daily x 2 weeks Seen by ENT Dr. ROSALEA on 08/12/2024, given her FFP and vitamin K  on 08/12/2024 as likely ongoing bleeding in the setting of ESLD is due to that, H&H and platelets have improved will continue to monitor, bleeding has now resolved, appreciate ENT input, she will be discharged on Ciprodex  eyedrops with outpatient follow-up with ENT within a week.  This problem has clinically resolved   Klebsiella bacteremia Unclear whether this is from left ear issues or from UTI (UA grossly abnormal-mild dysuria).  No significant ascites on IR evaluation for paracentesis-hence unlikely to be SBP related bacteremia. No dysuria CT abdomen/pelvis without any major urologic/biliary issues She was treated with IV cephalosporin, clinically no signs of infection, will get 4 more days of oral Keflex  upon discharge to complete total of 10..   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, coagulopathy, portal hypertension/radiographic evidence of varices Likely alcoholic in etiology-hepatitis serology negative-autoimmune workup/hemochromatosis workup still pending (elevated  ferritin levels)  Evaluated by radiology on 8/21-only trace amount of peritoneal fluid-insufficient for paracentesis-so doubt SBP GI saw the patient and planning to do EGD in outpatient setting postdischarge   Alcoholic hepatitis Transaminase elevation pattern consistent with alcoholic hepatitis Discriminant function more than 32-  GI on board on prednisolone  which she will get upon discharge for a total of 28 days, I also wanted her to be on Inderal  due to varices that were noted on CT scan, this was added with low-dose midodrine  to keep her blood pressure stable.  She has tolerated well overnight.   ?  1 episode of hematochezia at med center Patient denies any hematochezia at med center-this was  apparently seen by RN-no further hematochezia episodes since hospitalization Hb relatively stable Has evidence of portal hypertension/varices by CT imaging-GI planning endoscopy at some point in the future-possibly as an outpatient.   Normocytic anemia Likely secondary to underlying liver cirrhosis/folic acid  deficiency Currently stable.  PCP to monitor   Hyponatremia Due to cirrhosis, received few doses of albumin  and midodrine , stable.  PCP to monitor.   Hypokalemia/hypomagnesemia/hypophosphatemia Due to EtOH use Continue to replete/recheck.   EtOH use Last drink per patient on 8/16, counseled to abstain completely from alcohol use. Would be out of window for any withdrawal symptoms Counseled extensively regarding stopping any further alcohol use going forward.   Hypothyroidism TSH stable Synthroid   Strong family history of von Willebrand disease.  Upon patient request hematology consult was requested, Dr. Sherrod reviewed the chart and decided to follow in the outpatient setting von Willebrand panel has been sent out and results are pending PCP to monitor.   Discharge diagnosis     Principal Problem:   Liver failure (HCC) Active Problems:   Blood transfusion reaction-febrile illness   Thrombocytopenia (HCC)   Alcoholic hepatitis   E coli bacteremia   Bacteremia due to Enterobacter species   Hyponatremia   Anemia due to folic acid  deficiency   Bleeding from left ear   Melena   Alcoholic cirrhosis (HCC)   Esophageal varices without bleeding [facility])    Discharge instructions    Discharge Instructions     Discharge instructions   Complete by: As directed    Follow with Primary MD  in 7 days   Get CBC, CMP, Magnesium , 2 view Chest X ray -  checked next visit with your primary MD   Activity: As tolerated with Full fall precautions use walker/cane & assistance as needed  Disposition Home   Diet: Heart Healthy    Special Instructions: If you have smoked or  chewed Tobacco  in the last 2 yrs please stop smoking, stop any regular Alcohol  and or any Recreational drug use.  On your next visit with your primary care physician please Get Medicines reviewed and adjusted.  Please request your Prim.MD to go over all Hospital Tests and Procedure/Radiological results at the follow up, please get all Hospital records sent to your Prim MD by signing hospital release before you go home.  If you experience worsening of your admission symptoms, develop shortness of breath, life threatening emergency, suicidal or homicidal thoughts you must seek medical attention immediately by calling 911 or calling your MD immediately  if symptoms less severe.  You Must read complete instructions/literature along with all the possible adverse reactions/side effects for all the Medicines you take and that have been prescribed to you. Take any new Medicines after you have completely understood and accpet all the possible adverse reactions/side effects.  Do not drive when taking Pain medications.  Do not take more than prescribed Pain, Sleep and Anxiety Medications  Wear Seat belts while driving.   Discharge wound care:   Complete by: As directed    Kindly wear the provided TED stockings throughout the daytime for 2 weeks.   Increase activity slowly   Complete by: As directed        Discharge Medications   Allergies as of 08/15/2024       Reactions   Levofloxacin Other (See Comments)   Sulfonamide Derivatives Palpitations        Medication List     TAKE these medications    cephALEXin  500 MG capsule Commonly known as: KEFLEX  Take 1 capsule (500 mg total) by mouth 3 (three) times daily for 4 days.   ciprofloxacin -dexamethasone  OTIC suspension Commonly known as: CIPRODEX  Place 4 drops into the left ear 2 (two) times daily.   folic acid  1 MG tablet Commonly known as: FOLVITE  Take 1 tablet (1 mg total) by mouth daily. Start taking on: August 16, 2024    lactulose  10 GM/15ML solution Commonly known as: CHRONULAC  Take 15 mLs (10 g total) by mouth 2 (two) times daily.   levothyroxine  88 MCG tablet Commonly known as: Synthroid  Take 1 tablet (88 mcg total) by mouth daily before breakfast.   midodrine  5 MG tablet Commonly known as: PROAMATINE  Take 1 tablet (5 mg total) by mouth 2 (two) times daily with a meal.   prednisoLONE  5 MG Tabs tablet Take 8 tablets (40 mg total) by mouth daily for 24 days. Start taking on: August 16, 2024   propranolol  10 MG tablet Commonly known as: INDERAL  Take 0.5 tablets (5 mg total) by mouth 2 (two) times daily.   thiamine  100 MG tablet Commonly known as: Vitamin B-1 Take 1 tablet (100 mg total) by mouth daily. Start taking on: August 16, 2024               Discharge Care Instructions  (From admission, onward)           Start     Ordered   08/15/24 0000  Discharge wound care:       Comments: Kindly wear the provided TED stockings throughout the daytime for 2 weeks.   08/15/24 0904             Follow-up Information     [facility]. Schedule an appointment as soon as possible for a visit in 1 week(s).   Contact information: [address] [place]  [address] [phone]        Leigh Elspeth SQUIBB, MD. Schedule an appointment as soon as possible for a visit in 1 week(s).   Specialty: Gastroenterology Contact information: [address] [place] [address] [phone]         Sherrod Sherrod, MD. Schedule an appointment as soon as possible for a visit in 1 week(s).   Specialty: Oncology Contact information: [address] [place] [address] [phone]         Karis Clunes, MD. Schedule an appointment as soon as possible for a visit in 2 day(s).   Specialty: Otolaryngology Contact information: [address] [place] [address] [phone]                  Major procedures and Radiology Reports - PLEASE review detailed and final reports thoroughly  -  DG Chest Port 1 View Result Date: 08/13/2024 EXAM: 1 VIEW XRAY OF THE CHEST 08/13/2024 07:05:00 AM COMPARISON: 08/08/2024 CLINICAL HISTORY: 141880 SOB (shortness of breath) [id number]. Table formatting from the original note was not included.; Reason for exam: ; SOB (shortness of breath SOB (shortness of breath) FINDINGS: LUNGS AND PLEURA: Mild increase in interstitial markings are noted bilaterally. No airspace consolidation or pneumothorax. Small bilateral pleural effusions noted, similar to the previous exam. HEART AND MEDIASTINUM: No acute abnormality of the cardiac and mediastinal silhouettes. BONES AND SOFT TISSUES: No acute osseous abnormality. IMPRESSION: 1. Small bilateral pleural effusions, similar to the previous exam. 2. Mild increase in interstitial markings bilaterally, which may reflect mild edema. 3. No airspace consolidation or pneumothorax. Electronically signed by: Waddell Calk MD 08/13/2024 07:24 AM EDT RP Workstation: [id number]   IR ABDOMEN US  LIMITED Result Date: 08/09/2024 INDICATION: 63 year old female with cirrhosis and ascites for diagnostic paracentesis. EXAM: ULTRASOUND ABDOMEN LIMITED FINDINGS: Imaging of all 4 quadrants of the abdomen reveal very minimal ascites IMPRESSION: Trace ascites seen in ultrasound. After discussion of the risks versus benefits of the procedure the patient decided to defer paracentesis at this time. Performed By Lavanda Jurist, PA-C Electronically Signed   By: [name].  Shick M.D.   On: 08/09/2024 09:20   DG Chest 1 View Result Date: 08/08/2024 CLINICAL DATA:  Decompensated cirrhosis. EXAM: CHEST  1 VIEW COMPARISON:  Chest radiograph dated 12/23/2011 FINDINGS: Shallow inspiration. Trace right pleural effusion. There are bibasilar atelectasis. No pneumothorax. The cardiac silhouette is within normal limits. No acute osseous pathology. IMPRESSION: Trace  right pleural effusion and bibasilar atelectasis. Electronically Signed   By: Vanetta Chou M.D.   On: 08/08/2024 20:54   CT ABDOMEN PELVIS W CONTRAST Result Date: 08/08/2024 CLINICAL DATA:  Acute abdominal pain.  Jaundice. EXAM: CT ABDOMEN AND PELVIS WITH CONTRAST TECHNIQUE: Multidetector CT imaging of the abdomen and pelvis was performed using the standard protocol following bolus administration of intravenous contrast. RADIATION DOSE REDUCTION: This exam was performed according to the departmental dose-optimization program which includes automated exposure control, adjustment of the mA and/or kV according to patient size and/or use of iterative reconstruction technique. CONTRAST:  OMNIPAQUE  IOHEXOL  300 MG/ML  SOLN COMPARISON:  None Available. FINDINGS: Lower Chest: Tiny right pleural effusion and mild right lower lobe atelectasis. Hepatobiliary: Mild capsular nodularity of the liver is noted. Numerous punctate low-attenuation nodules are seen throughout the hepatic parenchyma. These findings are consistent with micronodular cirrhosis. No hypervascular hepatic masses are identified. Perihepatic ascites noted. Tiny calcified gallstone noted, without signs of cholecystitis or biliary ductal dilatation. No evidence of biliary ductal dilatation. Pancreas:  No mass or inflammatory changes. Spleen: Within normal limits in size and appearance. Adrenals/Urinary Tract: No suspicious masses identified. No evidence of ureteral calculi or hydronephrosis. Unremarkable unopacified urinary bladder. Stomach/Bowel: No evidence of obstruction, inflammatory process or abnormal fluid collections. Vascular/Lymphatic: No pathologically enlarged lymph nodes. No acute vascular findings. Portosystemic venous collaterals are seen in the upper abdomen with gastroesophageal varices, consistent with portal venous hypertension. Reproductive:  No pelvic mass or inflammatory process identified. Other:  Moderate ascites and diffuse  mesenteric edema seen. Musculoskeletal:  No suspicious bone lesions identified. IMPRESSION: Findings consistent with micronodular hepatic cirrhosis, and portal venous hypertension with gastroesophageal varices. No radiographic evidence of hepatocellular carcinoma. Moderate ascites and diffuse mesenteric edema. Cholelithiasis. No radiographic evidence of cholecystitis or biliary ductal dilatation. Tiny right pleural effusion and mild right lower lobe atelectasis. Electronically Signed   By: Norleen DELENA Graham [name].D.  On: 08/08/2024 15:34   CT Temporal Bones Wo Contrast Result Date: 08/08/2024 EXAM: CT TEMPORAL BONES WITHOUT CONTRAST 08/08/2024 03:07:34 PM TECHNIQUE: CT of the temporal bones was performed without the administration of intravenous contrast. Multiplanar reformatted images are provided for review. Automated exposure control, iterative reconstruction, and/or weight based adjustment of the mA/kV was utilized to reduce the radiation dose to as low as reasonably achievable. COMPARISON: None available. CLINICAL HISTORY: Ataxia, head trauma; Persistent Bleeding from Ear, TM perforation. left ear bleeding this morning. No injuries to area., gen abd pain. Patient appears jaundice but not aware FINDINGS: RIGHT TEMPORAL BONE: EXTERNAL AUDITORY CANAL: Clear. No bony erosion. Scutum is intact. MIDDLE EAR CAVITY: Clear. Ossicular chain is intact. MASTOID AIR CELLS: Clear. INNER EAR: The cochlea and vestibule are unremarkable. Normal mineralization of the otic capsule. Normal semicircular canals. The vestibular aqueduct is not dilated. INTERNAL AUDITORY CANAL: Unremarkable. Normal bony canal of the facial nerve. LEFT TEMPORAL BONE: EXTERNAL AUDITORY CANAL: Large amount of external material at the opening of the left external auditory canal. The left external auditory canal is completely opacified. MIDDLE EAR CAVITY: There is partial opacification of the left middle ear cavity with sparing of the epitympanum. The tympanic  membrane is not visible because of the abutting soft tissue density material. No ossicular erosion. MASTOID AIR CELLS: Clear. INNER EAR: The cochlea and vestibule are unremarkable. Normal mineralization of the otic capsule. Normal semicircular canals. The vestibular aqueduct is not dilated. INTERNAL AUDITORY CANAL: Unremarkable. Normal bony canal of the facial nerve. VASCULAR: Normal jugular bulbs. Normal carotid canals. BRAIN: Unremarkable. ORBITS: No acute abnormality. SINUSES: Clear. IMPRESSION: 1. Large amount of external material at the opening of the left external auditory canal and complete opacification of the canal, obscuring the tympanic membrane. 2. Partial opacification of the left middle ear cavity with sparing of the epitympanum. No ossicular erosion. Electronically signed by: Franky Stanford MD 08/08/2024 03:30 PM EDT RP Workstation: [id number]    Micro Results    Recent Results (from the past 240 hours)  Culture, blood (Routine X 2) w Reflex to ID Panel     Status: Abnormal   Collection Time: 08/08/24  9:18 PM   Specimen: BLOOD LEFT ARM  Result Value Ref Range Status   Specimen Description BLOOD LEFT ARM  Final   Special Requests   Final    BOTTLES DRAWN AEROBIC AND ANAEROBIC Blood Culture adequate volume   Culture  Setup Time   Final    GRAM NEGATIVE RODS CRITICAL RESULT CALLED TO, READ BACK BY AND VERIFIED WITH: PHARMD AMEND, K. [id number] AT 1728, ADC Performed at [facility], [address] [place], [place] [address]    Culture KLEBSIELLA PNEUMONIAE (A)  Final   Report Status 08/11/2024 FINAL  Final   Organism ID, Bacteria KLEBSIELLA PNEUMONIAE  Final      Susceptibility   Klebsiella pneumoniae - MIC*    AMPICILLIN RESISTANT Resistant     CEFAZOLIN (NON-URINE) 2 SENSITIVE Sensitive     CEFEPIME <=0.12 SENSITIVE Sensitive     ERTAPENEM <=0.12 SENSITIVE Sensitive     CEFTRIAXONE  <=0.25 SENSITIVE Sensitive     CIPROFLOXACIN  <=0.06 SENSITIVE Sensitive     GENTAMICIN <=1  SENSITIVE Sensitive     MEROPENEM <=0.25 SENSITIVE Sensitive     TRIMETH/SULFA <=20 SENSITIVE Sensitive     AMPICILLIN/SULBACTAM <=2 SENSITIVE Sensitive     PIP/TAZO Value in next row Sensitive ug/mL     <=4 SENSITIVEThis is a modified FDA-approved test that has  been validated and its performance characteristics determined by the reporting laboratory.  This laboratory is certified under the Clinical Laboratory Improvement Amendments CLIA as qualified to perform high complexity clinical laboratory testing.    * KLEBSIELLA PNEUMONIAE  Blood Culture ID Panel (Reflexed)     Status: Abnormal   Collection Time: 08/08/24  9:18 PM  Result Value Ref Range Status   Enterococcus faecalis NOT DETECTED NOT DETECTED Final   Enterococcus Faecium NOT DETECTED NOT DETECTED Final   Listeria monocytogenes NOT DETECTED NOT DETECTED Final   Staphylococcus species NOT DETECTED NOT DETECTED Final   Staphylococcus aureus (BCID) NOT DETECTED NOT DETECTED Final   Staphylococcus epidermidis NOT DETECTED NOT DETECTED Final   Staphylococcus lugdunensis NOT DETECTED NOT DETECTED Final   Streptococcus species NOT DETECTED NOT DETECTED Final   Streptococcus agalactiae NOT DETECTED NOT DETECTED Final   Streptococcus pneumoniae NOT DETECTED NOT DETECTED Final   Streptococcus pyogenes NOT DETECTED NOT DETECTED Final   A.calcoaceticus-baumannii NOT DETECTED NOT DETECTED Final   Bacteroides fragilis NOT DETECTED NOT DETECTED Final   Enterobacterales DETECTED (A) NOT DETECTED Final    Comment: Enterobacterales represent a large order of gram negative bacteria, not a single organism. CRITICAL RESULT CALLED TO, READ BACK BY AND VERIFIED WITH: PHARMD AMEND, K. [id number] AT 1728, ADC    Enterobacter cloacae complex NOT DETECTED NOT DETECTED Final   Escherichia coli NOT DETECTED NOT DETECTED Final   Klebsiella aerogenes NOT DETECTED NOT DETECTED Final   Klebsiella oxytoca NOT DETECTED NOT DETECTED Final   Klebsiella pneumoniae  DETECTED (A) NOT DETECTED Final    Comment: CRITICAL RESULT CALLED TO, READ BACK BY AND VERIFIED WITH: PHARMD AMEND, K. [id number] AT 1728, ADC    Proteus species NOT DETECTED NOT DETECTED Final   Salmonella species NOT DETECTED NOT DETECTED Final   Serratia marcescens NOT DETECTED NOT DETECTED Final   Haemophilus influenzae NOT DETECTED NOT DETECTED Final   Neisseria meningitidis NOT DETECTED NOT DETECTED Final   Pseudomonas aeruginosa NOT DETECTED NOT DETECTED Final   Stenotrophomonas maltophilia NOT DETECTED NOT DETECTED Final   Candida albicans NOT DETECTED NOT DETECTED Final   Candida auris NOT DETECTED NOT DETECTED Final   Candida glabrata NOT DETECTED NOT DETECTED Final   Candida krusei NOT DETECTED NOT DETECTED Final   Candida parapsilosis NOT DETECTED NOT DETECTED Final   Candida tropicalis NOT DETECTED NOT DETECTED Final   Cryptococcus neoformans/gattii NOT DETECTED NOT DETECTED Final   CTX-M ESBL NOT DETECTED NOT DETECTED Final   Carbapenem resistance IMP NOT DETECTED NOT DETECTED Final   Carbapenem resistance KPC NOT DETECTED NOT DETECTED Final   Carbapenem resistance NDM NOT DETECTED NOT DETECTED Final   Carbapenem resist OXA 48 LIKE NOT DETECTED NOT DETECTED Final   Carbapenem resistance VIM NOT DETECTED NOT DETECTED Final    Comment: Performed at [facility], [address], [place], [place] [address]  Culture, blood (Routine X 2) w Reflex to ID Panel     Status: None   Collection Time: 08/08/24  9:31 PM   Specimen: BLOOD  Result Value Ref Range Status   Specimen Description BLOOD BLOOD RIGHT HAND  Final   Special Requests   Final    BOTTLES DRAWN AEROBIC AND ANAEROBIC Blood Culture adequate volume   Culture   Final    NO GROWTH 5 DAYS Performed at [facility], [address] [place] [place], [place] [address]    Report Status 08/13/2024 FINAL  Final  Culture, blood (Routine  X 2) w Reflex to ID Panel     Status: None (Preliminary result)   Collection Time:  08/11/24  4:41 PM   Specimen: BLOOD RIGHT ARM  Result Value Ref Range Status   Specimen Description BLOOD RIGHT ARM  Final   Special Requests   Final    BOTTLES DRAWN AEROBIC AND ANAEROBIC Blood Culture results may not be optimal due to an inadequate volume of blood received in culture bottles   Culture   Final    NO GROWTH 3 DAYS Performed at [facility], [address], [place], [place] [address]    Report Status PENDING  Incomplete  Culture, blood (Routine X 2) w Reflex to ID Panel     Status: None (Preliminary result)   Collection Time: 08/11/24  4:41 PM   Specimen: BLOOD RIGHT HAND  Result Value Ref Range Status   Specimen Description BLOOD RIGHT HAND  Final   Special Requests   Final    BOTTLES DRAWN AEROBIC AND ANAEROBIC Blood Culture results may not be optimal due to an inadequate volume of blood received in culture bottles   Culture   Final    NO GROWTH 3 DAYS Performed at [facility], [address], [place], [place] [address]    Report Status PENDING  Incomplete    Today   Subjective    Dula Havlik today has no headache,no chest abdominal pain,no new weakness tingling or numbness, feels much better wants to go home today.    Objective   Blood pressure 94/60, pulse 65, temperature (!) 97.4 F (36.3 C), temperature source Oral, resp. rate 16, height 5' 3 (1.6 m), weight 60.5 kg, SpO2 94%.   Intake/Output Summary (Last 24 hours) at 08/15/2024 0909 Last data filed at 08/14/2024 1300 Gross per 24 hour  Intake 480 ml  Output --  Net 480 ml    Exam  Awake Alert, No new F.N deficits,    [place].AT,PERRAL Supple Neck,   Symmetrical Chest wall movement, Good air movement bilaterally, CTAB RRR,No Gallops,   +ve B.Sounds, Abd Soft, Non tender,  Chronic trace left leg edema per patient present for several months   Data Review   Recent Labs  Lab 08/08/24 1325 08/08/24 2118 08/12/24 0045 08/12/24 0548 08/13/24 0557 08/14/24 0527 08/15/24 0502   WBC 9.8   < > 6.7 8.2 5.9 10.2 9.7  HGB 11.5*   < > 8.5* 10.7* 9.4* 10.3* 11.2*  HCT 32.0*   < > 23.9* 30.0* 27.1* 29.6* 32.8*  PLT 33*   < > 43* 73* 49* 74* 110*  MCV 105.6*   < > 107.7* 107.9* 103.8* 103.9* 105.5*  MCH 38.0*   < > 38.3* 38.5* 36.0* 36.1* 36.0*  MCHC 35.9   < > 35.6 35.7 34.7 34.8 34.1  RDW 15.7*   < > 15.5 15.7* 21.5* 21.2* 21.6*  LYMPHSABS 0.7  --   --   --  0.4* 0.8 0.5*  MONOABS 1.1*  --   --   --  0.6 1.5* 1.0  EOSABS 0.0  --   --   --  0.0 0.0 0.0  BASOSABS 0.0  --   --   --  0.0 0.0 0.0   < > = values in this interval not displayed.    Recent Labs  Lab 08/08/24 2118 08/09/24 0010 08/09/24 0518 08/10/24 9454 08/11/24 9056 08/12/24 0045 08/12/24 0548 08/13/24 0557 08/14/24 0527 08/15/24 0502  NA 126*  --  126* 126*  125*  --  128* 130* 131* 134*  K 3.6  --  3.1* 2.8* 3.0*  --  3.0* 3.4* 3.8 3.8  CL 92*  --  93* 94* 91*  --  94* 99 96* 98  CO2 20*  --  21* 25 24  --  25 25 25 25   ANIONGAP 14  --  12 7 10   --  9 6 10 11   GLUCOSE 123*  --  98 88 99  --  85 120* 114* 113*  BUN 6  --  <5* <5* 5*  --  <5* <5* 8 8  CREATININE 0.66  --  0.76 0.68 0.80  --  0.81 0.65 0.67 0.63  AST 299*  --  255* 255* 266*  --  219* 126* 125* 140*  ALT 64*  --  56* 60* 67*  --  60* 41 45* 56*  ALKPHOS 113  --  89 90 98  --  94 74 111 118  BILITOT 7.2*  --  5.9* 6.0* 8.3*  --  9.3* 7.2* 7.0* 7.1*  ALBUMIN  1.8*  --  1.7* 1.6* 1.7*  --  2.3* 2.5* 2.4* 2.5*  LATICACIDVEN 3.1* 3.2* 2.5*  --   --   --   --   --   --   --   INR  --   --  1.7* 2.0* 2.3* 2.2* 1.8* 2.1* 2.0* 1.9*  TSH 3.397  --   --   --   --   --   --   --   --   --   AMMONIA 57*  --   --   --   --   --   --   --   --   --   MG 1.5*  --  1.3* 1.9 1.8  --   --  2.0 2.0 1.9  PHOS 1.9*  --  1.8* 2.4* 2.6  --   --   --   --   --   CALCIUM 7.3*  --  7.2* 7.0* 7.2*  --  7.5* 7.7* 8.4* 8.5*    Total Time in preparing paper work, data evaluation and todays exam - 35 minutes  Signature  -    Lavada Stank M.D on  08/15/2024 at 9:09 AM   -  To page go to [email or web address]

## 2024-08-15 NOTE — Progress Notes (Signed)
 Progress Note   Subjective  Doing well, eating well, tolerated propranolol . To be discharged this AM. Husband had several questions this AM about plans.    Objective   Vital signs in last 24 hours: Temp:  [97.4 F (36.3 C)-98.2 F (36.8 C)] 97.4 F (36.3 C) (08/27 0808) Pulse Rate:  [61-83] 65 (08/27 0808) Resp:  [15-20] 16 (08/27 0443) BP: (94-108)/(60-73) 94/60 (08/27 0808) SpO2:  [94 %-98 %] 94 % (08/27 0443) Last BM Date : 08/14/24 General:    white female in NAD, juandiced. Neurologic:  Alert and oriented,  grossly normal neurologically. Psych:  Cooperative. Normal mood and affect.  Intake/Output from previous day: 08/26 0701 - 08/27 0700 In: 480 [P.O.:480] Out: -  Intake/Output this shift: No intake/output data recorded.  Lab Results: Recent Labs    08/13/24 0557 08/14/24 0527 08/15/24 0502  WBC 5.9 10.2 9.7  HGB 9.4* 10.3* 11.2*  HCT 27.1* 29.6* 32.8*  PLT 49* 74* 110*   BMET Recent Labs    08/13/24 0557 08/14/24 0527 08/15/24 0502  NA 130* 131* 134*  K 3.4* 3.8 3.8  CL 99 96* 98  CO2 25 25 25   GLUCOSE 120* 114* 113*  BUN <5* 8 8  CREATININE 0.65 0.67 0.63  CALCIUM 7.7* 8.4* 8.5*   LFT Recent Labs    08/15/24 0502  PROT 6.7  ALBUMIN  2.5*  AST 140*  ALT 56*  ALKPHOS 118  BILITOT 7.1*   PT/INR Recent Labs    08/14/24 0527 08/15/24 0502  LABPROT 23.3* 22.4*  INR 2.0* 1.9*    Studies/Results: No results found.     Assessment / Plan:    63 y/o female here with the following:  Decompensated cirrhosis Alcoholic hepatitis Klebsiella / enterobacter bacteremia -  source, UTI vs. 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   See prior notes for details. Newly diagnosed cirrhosis with alcoholic hepatitis, DF > 32, MELD 27. Infection treated, started on prednisolone , now day #4. Stable at this time. INR and bili stable.  She is eating well, labs stable, okay for discharge today.   Recall we had a lengthy discussion  yesterday about suspected varices noted on CT scan. Given she likely has Von Willebrands and her thrombocytopenia, at high risk for bleeding. We had discussed doing an EGD to screen for varices but she wanted to do it at a later time as outpatient and give more time to improve. We discussed empirically putting her on a low dose of propranolol  if her BP tolerated it (on midodrine ). We discussed risks of propranolol  vs. risks of not being on it and her risks for bleeding from varices at length. So far she has tolerated it well and wishes to continue it as outpatient. We discussed that if she has dizziness / lightheadedness on it, she will need to stop it.   Otherwise she will continue course of antibiotics, continue prednisolone , continue lactulose . Holding off on diuretics given her BP, keeping on midodrine  while she is on propranolol  trial. We will plan on labs on Tuesday of next week (Monday is a holiday), calculate Lille score to determine if steroids are continue or not. Complete alcohol abstinence discussed. Also recommend low Na diet (< 2gm/day). All of this discussed with them, she agrees with the plan. We will otherwise coordinate close outpatient follow up and also EGD at some point. She will need outpatient follow up with hematology to complete workup and clarify if she has Von Willebrands.  PLAN: - discharge today - low Na diet - continue prednisolone  on discharge - repeat labs on Tuesday of next week, calculate Lille score - continuing lactulose  as outpatient - complete course of antibiotics - continue midodrine  - trial of propranolol  10mg  BID for now as outlined above given risks for bleeding. As above, she wishes to defer EGD until hematology workup done - workup for VonWillebrands per hematology as outpatient, lab sent, will not be back for a few weeks  Call with questions. We will sign off for now.     Rachel Naval, MD [facility]

## 2024-08-15 NOTE — Telephone Encounter (Signed)
 The patient was just discharged today from the hospital.  Her husband called in concerned about whether or not to continue the ear drops and/or to remove the cotton in her ear.  He reported that it took more than one treatment to get her ear to stop bleeding and he does not want to apply any drops into her ear until he hears back from us .  Please call MR as [phone].

## 2024-08-15 NOTE — Plan of Care (Signed)

## 2024-08-16 ENCOUNTER — Other Ambulatory Visit: Payer: Self-pay | Admitting: Oncology

## 2024-08-16 ENCOUNTER — Other Ambulatory Visit: Payer: Self-pay

## 2024-08-16 ENCOUNTER — Telehealth: Payer: Self-pay

## 2024-08-16 DIAGNOSIS — H9222 Otorrhagia, left ear: Secondary | ICD-10-CM

## 2024-08-16 DIAGNOSIS — D696 Thrombocytopenia, unspecified: Secondary | ICD-10-CM

## 2024-08-16 DIAGNOSIS — K7010 Alcoholic hepatitis without ascites: Secondary | ICD-10-CM

## 2024-08-16 NOTE — Telephone Encounter (Signed)
 Spoke to patient and her husband and informed her of providers recommendations. Patient scheduled with Dr. Tobie 08/23/2024.

## 2024-08-16 NOTE — Telephone Encounter (Signed)
 Unable to leave message for patient, VM is not an option. OV scheduled for 9/11 at 8:40 am with Alan, [place] & lab orders placed.

## 2024-08-16 NOTE — Telephone Encounter (Signed)
-----   Message from Rosario JAYSON Kidney sent at 08/15/2024 12:19 PM EDT ----- Regarding: RE: dorsey outpatient Got it, thanks Marcey ----- Message ----- From: Leigh Elspeth SQUIBB, MD Sent: 08/15/2024  10:14 AM EDT To: Rosario JAYSON Kidney, MD; [facility] Pod B Triage; [facility] P# Subject: dorsey outpatient                              This patient is being discharged from the hospital today. On Tuesday of next week she will need labs done - CBC, CMET, INR, if you can order for. She should also have follow up scheduled with APP or Dr. Kidney in the next 2-4 weeks if possible for alcoholic hepatitis / cirrhosis. I sent to both POD B and C, sorry can't recall which POD Dr. Kidney is in  [place] - I think this patient will belong to you, patient did well but please see my notes for details. 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 Very high risk for bleeding, she declined EGD during hospital stay until Vonwillebrands workup is done. Started her empirically on very low dose propranolol , she tolerated it in the hospital, but also on midocrine to keep BP up. Holding off on diuretics in this light. She will continue prednisolone , labs next week to see if it should be continued. Of note, just an FYI, her family is close friends with the [employer].   Marcey

## 2024-08-17 LAB — CULTURE, BLOOD (ROUTINE X 2)
Culture: NO GROWTH
Culture: NO GROWTH

## 2024-08-17 NOTE — Telephone Encounter (Signed)
 Unable to leave message for patient, VM is not an option.

## 2024-08-21 ENCOUNTER — Ambulatory Visit: Payer: Self-pay | Admitting: Internal Medicine

## 2024-08-21 ENCOUNTER — Other Ambulatory Visit: Payer: Self-pay | Admitting: Internal Medicine

## 2024-08-21 ENCOUNTER — Other Ambulatory Visit (INDEPENDENT_AMBULATORY_CARE_PROVIDER_SITE_OTHER)

## 2024-08-21 DIAGNOSIS — K7010 Alcoholic hepatitis without ascites: Secondary | ICD-10-CM | POA: Diagnosis not present

## 2024-08-21 LAB — COMPREHENSIVE METABOLIC PANEL WITH GFR
ALT: 89 U/L — ABNORMAL HIGH (ref 0–35)
AST: 148 U/L — ABNORMAL HIGH (ref 0–37)
Albumin: 3 g/dL — ABNORMAL LOW (ref 3.5–5.2)
Alkaline Phosphatase: 123 U/L — ABNORMAL HIGH (ref 39–117)
BUN: 14 mg/dL (ref 6–23)
CO2: 28 meq/L (ref 19–32)
Calcium: 8.7 mg/dL (ref 8.4–10.5)
Chloride: 102 meq/L (ref 96–112)
Creatinine, Ser: 0.6 mg/dL (ref 0.40–1.20)
GFR: 97.28 mL/min (ref >60.00)
Glucose, Bld: 88 mg/dL (ref 70–99)
Potassium: 3.2 meq/L — ABNORMAL LOW (ref 3.5–5.1)
Sodium: 138 meq/L (ref 135–145)
Total Bilirubin: 10.3 mg/dL — ABNORMAL HIGH (ref 0.2–1.2)
Total Protein: 7.1 g/dL (ref 6.0–8.3)

## 2024-08-21 LAB — CBC
HCT: 36.9 % (ref 36.0–46.0)
Hemoglobin: 12.3 g/dL (ref 12.0–15.0)
MCHC: 33.4 g/dL (ref 30.0–36.0)
MCV: 110.4 fl — ABNORMAL HIGH (ref 78.0–100.0)
Platelets: 103 K/uL — ABNORMAL LOW (ref 150.0–400.0)
RBC: 3.34 Mil/uL — ABNORMAL LOW (ref 3.87–5.11)
RDW: 22.5 % — ABNORMAL HIGH (ref 11.5–15.5)
WBC: 14.9 K/uL — ABNORMAL HIGH (ref 4.0–10.5)

## 2024-08-21 LAB — PROTIME-INR
INR: 2.5 ratio — ABNORMAL HIGH (ref 0.8–1.0)
Prothrombin Time: 25.1 s — ABNORMAL HIGH (ref 9.6–13.1)

## 2024-08-21 MED ORDER — POTASSIUM CHLORIDE CRYS ER 20 MEQ PO TBCR: 20.0000 meq | EXTENDED_RELEASE_TABLET | Freq: Every day | ORAL | 0 refills | Status: AC

## 2024-08-21 MED ORDER — ENULOSE 10 GM/15ML PO SOLN
10.0000 g | Freq: Two times a day (BID) | ORAL | 2 refills | Status: DC
Start: 2024-08-21 — End: 2024-09-18

## 2024-08-21 NOTE — Progress Notes (Signed)
 Attempted to call the patient twice at her mobile number and once at her phone number.  I left a voicemail on her mobile number letting her know that I called her on the second attempt.    Pod B triage, please continue efforts to reach this patient.  Received the results of her labs from today, and it suggests that her liver labs appear to be slightly worse than they were previously.  Based on her Lille score, she has a nonresponder to steroids and as a result I would recommend that she go ahead and stop her prednisone  therapy.  Her potassium was slightly low so I recommend that she take potassium chloride  tablets 20 mEq daily for 2 days (we can send this to her pharmacy).  I do think that she needs to be referred for consideration of liver transplant so lets go ahead and refer her to [facility] transplant since her MELD score is currently 81 (if she wants to be seen in [place], we can refer her to Manuelita Novak in the [place] clinic). Please ensure that she is not drinking any alcohol. I see she has a follow-up appointment scheduled on 08/30/24 so we can recheck her labs at that time.

## 2024-08-21 NOTE — Telephone Encounter (Signed)
 Left message for pt to call back

## 2024-08-21 NOTE — Progress Notes (Signed)
 Spoke to the patient's husband about the results of her labs.  Patient overall is looking well and has been taking walks and trying to eat more.  Although her Lille score suggest that she is not responding to steroids, I think it is still possible that her liver could recover with additional time.  We will continue to trend her labs over time to make sure that they eventually downtrend.  Patient has been taking her lactulose  as prescribed.  She is having 2-3 bowel movements per day currently.  Will refill her lactulose  so that she has enough for the next few months.  Patient and her husband would like to hold off on a referral to liver transplant for now and discuss this in person when she comes in for her clinic appointment on 9/11.  I am in clinic that morning I will try to be available to participate in this discussion when she sees Alan Coombs.  Patient's husband states she will pick up the potassium tablets and have the patient take her potassium.

## 2024-08-22 ENCOUNTER — Other Ambulatory Visit (HOSPITAL_BASED_OUTPATIENT_CLINIC_OR_DEPARTMENT_OTHER): Payer: Self-pay

## 2024-08-22 ENCOUNTER — Institutional Professional Consult (permissible substitution) (INDEPENDENT_AMBULATORY_CARE_PROVIDER_SITE_OTHER): Admitting: Otolaryngology

## 2024-08-22 NOTE — Progress Notes (Unsigned)
 08/22/2024 REANNON CANDELLA [id number] 01-12-1961  Referring provider: Auston Opal, DO Primary GI doctor: Dr. Federico  ASSESSMENT AND PLAN:  Decompensated cirrhosis with likely superimposed alcoholic hepatitis newly diagnosed on admission 07/2024 with associated hyponatremia, ascites, thrombocytopenia, gastric varices, and coagulopathy. 08/21/2024 WBC 14.9 HGB 12.3 Platelets 103.0 08/21/2024 AST 148 ALT 89 Alkphos 123 TBili 10.3 08/21/2024 INR 2.5 MELD 3.0: 27 at 08/21/2024 10:57 AM MELD-Na: 26 at 08/21/2024 10:57 AM Calculated from: Serum Creatinine: 0.6 mg/dL (Using min of 1 mg/dL) at 0/06/7973 89:42 AM Serum Sodium: 138 mEq/L (Using max of 137 mEq/L) at 08/21/2024 10:57 AM Total Bilirubin: 10.3 mg/dL at 0/06/7973 89:42 AM Serum Albumin : 3 g/dL at 0/06/7973 89:42 AM INR(ratio): 2.5 ratio at 08/21/2024 10:57 AM Age at listing (hypothetical): 62 years Sex: Female at 08/21/2024 10:57 AM Maddrey's discriminant function greater than 32 steroids initially not started due to Klebsiella/Enterobacter bacteremia however patient had repeat cultures and urine negative so prednisolone   started 8/24, Lilly score did not improve prednisolone   discontinued Dr. Federico has discussed referral to [facility], Manuelita Novak  Serologic workup: Negative hepatitis panel, negative anti-smooth muscle antibody ANA, hemochromatosis DNA, IgG slightly elevated at 2130. Suggest hepatitis B vaccination  Ascites:      Attempted paracentesis 08/09/2024 only trace ascites -Nutrition and low sodium diet discussed with patient and information given - appears euvolemic ***  Varices screening / surveillance EGD:    Spleen unremarkable on CT but CT did show evidence of esophageal varices Due to severe thrombocytopenia and von Willebrand's EGD was not attempted inpatient, patient was prophylactically started on propranolol  10 mg twice daily with midodrine   Hepatic encephalopathy:  Pt does not report any symptoms  consistent with HE and no asterixis on exam.  Continue lactulose  15 mL by mouth 2 times daily {acaccirrhosishepaticencephalopathy:26796}  Most recent HCC screening:    CTAP W8/20/2025 capsular nodularity of the liver low attention nodules are seen throughout the hepatic parenchyma consistent with micronodular cirrhosis no hypervascular hepatic masses tiny calcified stones without cholecystitis no evidence of biliary duct dilation spleen unremarkable Will get AFP Will place recall ultrasound February 2026  Provided general information to the patient: -Continue daily multivitamin -Recommended 30 minutes of aerobic and resistance exercise 3 days/week -Encouraged pt to increase protein intake  Thrombocytopenia secondary to above Platelets 103.0  Hypokalemia BUN 14 Cr 0.60  GFR >60  Potassium 3.2  Magnesium  1.9  Diuretics on hold Given potassium 20 mill equivalents daily for 2 days  Hyponatremia Significantly improved to 138  Von Willebrand's Has appointment with hematology 08/31/2024 to be evaluated for von Willebrand's and risk of bleeding Attempt to coordinate endoscopy after this office visit.  Alcohol use -Alcohol Abstinence counseling discussed with patient as continued use is strongly associated with worsening liver disease progression - get on MVIT - discuss with PCP about resources and medications, discussed briefly with patient  Hypothyroidism Lab Results  Component Value Date   TSH 3.397 08/08/2024     Patient Care Team: Auston Opal, DO as PCP - General (Family Medicine)  HISTORY OF PRESENT ILLNESS: 63 y.o. female with medical history significant for hypothyroidism, von Willebrand's, newly diagnosed decompensated cirrhosis presents for follow up of cirrhosis secondary to ETOH during hospital visit 8/20 through 8/27  Patient initially seen 08/09/2024 as a new consult for newly diagnosed decompensated cirrhosis with ascites, gastric varices, severe  thrombocytopenia, hyponatremia and coagulopathy and most likely superimposed alcohol hepatitis.  Last EGD was ***.  Last HCC screen: *** Last  AFP No results found for requested labs within last 1095 days. No results found for requested labs within last 1095 days.  Last INR: 08/21/2024 2.5   {cirrhosishepaticencephalopathy:26796} She {Denies/complains:31533} swelling.  She {Actions; are/are not:16769} on spironolactone  and lasix .  Wt Readings from Last 3 Encounters:  08/14/24 133 lb 6.1 oz (60.5 kg)  12/23/11 110 lb (49.9 kg)  03/14/09 118 lb (53.5 kg)    Discussed the use of AI scribe software for clinical note transcription with the patient, who gave verbal consent to proceed.  History of Present Illness            Hepatitis immunity status:  No results found for requested labs within last 1095 days. HepA No results found for requested labs within last 1095 days.  08/09/2024 HepBsAG NON REACTIVE  08/09/2024 HepAsAB NON REACTIVE  No results found for requested labs within last 1095 days. HepCAB No results found for requested labs within last 1095 days.   Social history:  She  reports that she quit smoking about 34 years ago. Her smoking use included cigarettes. She started smoking about 44 years ago. She has a 2 pack-year smoking history. She does not have any smokeless tobacco history on file. She reports current alcohol use of about 8.0 standard drinks of alcohol per week. She reports that she does not use drugs.  RELEVANT GI HISTORY, LABS, IMAGING:  CBC    Component Value Date/Time   WBC 14.9 (H) 08/21/2024 1057   RBC 3.34 (L) 08/21/2024 1057   HGB 12.3 08/21/2024 1057   HCT 36.9 08/21/2024 1057   PLT 103.0 (L) 08/21/2024 1057   MCV 110.4 (H) 08/21/2024 1057   MCH 36.0 (H) 08/15/2024 0502   MCHC 33.4 08/21/2024 1057   RDW 22.5 (H) 08/21/2024 1057   LYMPHSABS 0.5 (L) 08/15/2024 0502   MONOABS 1.0 08/15/2024 0502   EOSABS 0.0 08/15/2024 0502   BASOSABS 0.0 08/15/2024  0502   Recent Labs    08/08/24 2118 08/09/24 0518 08/10/24 0545 08/11/24 0943 08/12/24 0045 08/12/24 0548 08/13/24 0557 08/14/24 0527 08/15/24 0502 08/21/24 1057  HGB 11.2* 10.1* 10.0* 11.1* 8.5* 10.7* 9.4* 10.3* 11.2* 12.3    CMP     Component Value Date/Time   NA 138 08/21/2024 1057   K 3.2 (L) 08/21/2024 1057   CL 102 08/21/2024 1057   CO2 28 08/21/2024 1057   GLUCOSE 88 08/21/2024 1057   BUN 14 08/21/2024 1057   CREATININE 0.60 08/21/2024 1057   CALCIUM 8.7 08/21/2024 1057   PROT 7.1 08/21/2024 1057   ALBUMIN  3.0 (L) 08/21/2024 1057   AST 148 (H) 08/21/2024 1057   ALT 89 (H) 08/21/2024 1057   ALKPHOS 123 (H) 08/21/2024 1057   BILITOT 10.3 (H) 08/21/2024 1057   GFRNONAA >60 08/15/2024 0502      Latest Ref Rng & Units 08/21/2024   10:57 AM 08/15/2024    5:02 AM 08/14/2024    5:27 AM  Hepatic Function  Total Protein 6.0 - 8.3 g/dL 7.1  6.7  6.4   Albumin  3.5 - 5.2 g/dL 3.0  2.5  2.4   AST 0 - 37 U/L 148  140  125   ALT 0 - 35 U/L 89  56  45   Alk Phosphatase 39 - 117 U/L 123  118  111   Total Bilirubin 0.2 - 1.2 mg/dL 89.6  7.1  7.0       Current Medications:   Current Outpatient Medications (Endocrine & Metabolic):  levothyroxine  (SYNTHROID ) 88 MCG tablet, Take 1 tablet (88 mcg total) by mouth daily before breakfast.   predniSONE  (DELTASONE ) 20 MG tablet, Take 2 tablets (40 mg total) by mouth daily for 24 days.  Current Outpatient Medications (Cardiovascular):    midodrine  (PROAMATINE ) 5 MG tablet, Take 1 tablet (5 mg total) by mouth 2 (two) times daily with a meal.   propranolol  (INDERAL ) 10 MG tablet, Take 0.5 tablets (5 mg total) by mouth 2 (two) times daily.    Current Outpatient Medications (Hematological):    folic acid  (FOLVITE ) 1 MG tablet, Take 1 tablet (1 mg total) by mouth daily.  Current Outpatient Medications (Other):    neomycin -polymyxin b-dexamethasone  (MAXITROL ) 3.5-10000-0.1 SUSP, Place 4 drops into left ear 3-4 daily.   lactulose ,  encephalopathy, (ENULOSE ) 10 GM/15ML SOLN, Take 15 mLs (10 g total) by mouth 2 (two) times daily.   potassium chloride  SA (KLOR-CON  M) 20 MEQ tablet, Take 1 tablet (20 mEq total) by mouth daily for 2 days.   thiamine  (VITAMIN B1) 100 MG tablet, Take 1 tablet (100 mg total) by mouth daily.  Medical History:  Past Medical History:  Diagnosis Date   Hypothyroidism    Thyroid  disease    Von Willebrand disease (HCC)    Allergies:  Allergies  Allergen Reactions   Levofloxacin Other (See Comments)   Sulfonamide Derivatives Palpitations     Surgical History:  She  has a past surgical history that includes Cyst removal neck (1995). Family History:  Her family history includes Asthma in her mother; Eczema in her mother.  REVIEW OF SYSTEMS  : All other systems reviewed and negative except where noted in the History of Present Illness.  PHYSICAL EXAM: There were no vitals taken for this visit. General :  Alert, well developed female in no acute distress Head:  Normocephalic and atraumatic. Eyes :  {sclerae:26738},conjunctive {conjuctiva:26739}  Heart:  {HEART EXAM HEM/ONC:21750} Pulm:  Clear anteriorly; no wheezing Abdomen:   {BlankSingle:19197::Distended,Ridged,Soft}, {BlankSingle:19197::Flat,Obese} AB, skin exam {ABDOMEN SKIN EXAM:22649}, {BlankSingle:19197::Absent,Hyperactive, tinkling,Hypoactive,Sluggish,Normal} bowel sounds. {Desc; pc desc - abdomen tenderness:5168} tenderness {anatomy; site abdomen:5010}. {BlankMultiple:19196::Without guarding,With guarding,Without rebound,With rebound}, {Exam; abdomen organomegaly:15152}. {No plus wild card:[phone]}  fluid wave, {No plus wild card:[phone]}  shifting dullness.  Extremities:   {With/Without:304960234} edema. Msk:  Symmetrical without gross deformities. Peripheral pulses intact.  Neurologic: Alert and  oriented x4;  grossly normal neurologically. {With-without:32421} asterixis or clonus.  Skin:    {With-without:32421} jaundice. {No plus wild card:[phone]} palmar erythema or spider angioma.   Psychiatric:  Demonstrates good judgement and reason without abnormal affect or behaviors.    Alan JONELLE Coombs, PA-C 9:29 AM

## 2024-08-22 NOTE — Telephone Encounter (Signed)
 Chart reviewed and noted that following documentation where Dr. Federico had spoke with pt yesterday.   Progress Notes Info  Chartered loss adjuster Note Status Last Update User Last Update Date/Time  Federico Rosario BROCKS, MD Signed Federico Rosario BROCKS, MD 08/21/2024  5:41 PM   Progress Notes   Spoke to the patient's husband about the results of her labs.  Patient overall is looking well and has been taking walks and trying to eat more.  Although her Lille score suggest that she is not responding to steroids, I think it is still possible that her liver could recover with additional time.  We will continue to trend her labs over time to make sure that they eventually downtrend.  Patient has been taking her lactulose  as prescribed.  She is having 2-3 bowel movements per day currently.  Will refill her lactulose  so that she has enough for the next few months.  Patient and her husband would like to hold off on a referral to liver transplant for now and discuss this in person when she comes in for her clinic appointment on 9/11.  I am in clinic that morning I will try to be available to participate in this discussion when she sees Alan Coombs.  Patient's husband states she will pick up the potassium tablets and have the patient take her potassium.

## 2024-08-23 ENCOUNTER — Ambulatory Visit (INDEPENDENT_AMBULATORY_CARE_PROVIDER_SITE_OTHER): Admitting: Otolaryngology

## 2024-08-23 ENCOUNTER — Encounter (INDEPENDENT_AMBULATORY_CARE_PROVIDER_SITE_OTHER): Payer: Self-pay | Admitting: Otolaryngology

## 2024-08-23 VITALS — BP 116/80 | HR 68 | Ht 63.0 in | Wt 110.0 lb

## 2024-08-23 DIAGNOSIS — H9222 Otorrhagia, left ear: Secondary | ICD-10-CM

## 2024-08-23 DIAGNOSIS — H6122 Impacted cerumen, left ear: Secondary | ICD-10-CM | POA: Diagnosis not present

## 2024-08-23 DIAGNOSIS — S01312D Laceration without foreign body of left ear, subsequent encounter: Secondary | ICD-10-CM | POA: Diagnosis not present

## 2024-08-23 DIAGNOSIS — D689 Coagulation defect, unspecified: Secondary | ICD-10-CM | POA: Diagnosis not present

## 2024-08-23 DIAGNOSIS — H7292 Unspecified perforation of tympanic membrane, left ear: Secondary | ICD-10-CM

## 2024-08-23 MED ORDER — NEOMYCIN-POLYMYXIN-HC 3.5-10000-1 OT SOLN
4.0000 [drp] | Freq: Two times a day (BID) | OTIC | 2 refills | Status: DC
Start: 2024-08-23 — End: 2024-08-30

## 2024-08-24 ENCOUNTER — Telehealth (INDEPENDENT_AMBULATORY_CARE_PROVIDER_SITE_OTHER): Payer: Self-pay

## 2024-08-24 NOTE — Telephone Encounter (Signed)
 Patient informed about providers recommendations and verbalizes understanding.

## 2024-08-27 ENCOUNTER — Telehealth (INDEPENDENT_AMBULATORY_CARE_PROVIDER_SITE_OTHER): Payer: Self-pay | Admitting: Otolaryngology

## 2024-08-27 NOTE — Telephone Encounter (Signed)
Informed patient of provider's message.Patient verbalizes understanding.

## 2024-08-28 ENCOUNTER — Telehealth (INDEPENDENT_AMBULATORY_CARE_PROVIDER_SITE_OTHER): Payer: Self-pay | Admitting: Otolaryngology

## 2024-08-28 NOTE — Telephone Encounter (Signed)
 Called in Ciprodex  r/x to [facility]. Patient informed and verbalizes understanding.

## 2024-08-29 ENCOUNTER — Ambulatory Visit: Payer: Self-pay | Admitting: Internal Medicine

## 2024-08-29 ENCOUNTER — Other Ambulatory Visit (INDEPENDENT_AMBULATORY_CARE_PROVIDER_SITE_OTHER)

## 2024-08-29 DIAGNOSIS — K7010 Alcoholic hepatitis without ascites: Secondary | ICD-10-CM | POA: Diagnosis not present

## 2024-08-29 LAB — CBC WITH DIFFERENTIAL/PLATELET
Basophils Absolute: 0.2 K/uL — ABNORMAL HIGH (ref 0.0–0.1)
Basophils Relative: 1.2 % (ref 0.0–3.0)
Eosinophils Absolute: 0.4 K/uL (ref 0.0–0.7)
Eosinophils Relative: 3 % (ref 0.0–5.0)
HCT: 34.2 % — ABNORMAL LOW (ref 36.0–46.0)
Hemoglobin: 11.4 g/dL — ABNORMAL LOW (ref 12.0–15.0)
Lymphocytes Relative: 12.9 % (ref 12.0–46.0)
Lymphs Abs: 1.8 K/uL (ref 0.7–4.0)
MCHC: 33.4 g/dL (ref 30.0–36.0)
MCV: 111.8 fl — ABNORMAL HIGH (ref 78.0–100.0)
Monocytes Absolute: 1.1 K/uL — ABNORMAL HIGH (ref 0.1–1.0)
Monocytes Relative: 8.2 % (ref 3.0–12.0)
Neutro Abs: 10.2 K/uL — ABNORMAL HIGH (ref 1.4–7.7)
Neutrophils Relative %: 74.7 % (ref 43.0–77.0)
Platelets: 102 K/uL — ABNORMAL LOW (ref 150.0–400.0)
RBC: 3.06 Mil/uL — ABNORMAL LOW (ref 3.87–5.11)
RDW: 19.3 % — ABNORMAL HIGH (ref 11.5–15.5)
WBC: 13.6 K/uL — ABNORMAL HIGH (ref 4.0–10.5)

## 2024-08-29 LAB — COMPREHENSIVE METABOLIC PANEL WITH GFR
ALT: 69 U/L — ABNORMAL HIGH (ref 0–35)
AST: 142 U/L — ABNORMAL HIGH (ref 0–37)
Albumin: 2.8 g/dL — ABNORMAL LOW (ref 3.5–5.2)
Alkaline Phosphatase: 156 U/L — ABNORMAL HIGH (ref 39–117)
BUN: 12 mg/dL (ref 6–23)
CO2: 28 meq/L (ref 19–32)
Calcium: 8.4 mg/dL (ref 8.4–10.5)
Chloride: 100 meq/L (ref 96–112)
Creatinine, Ser: 0.69 mg/dL (ref 0.40–1.20)
GFR: 94.05 mL/min (ref >60.00)
Glucose, Bld: 103 mg/dL — ABNORMAL HIGH (ref 70–99)
Potassium: 3.4 meq/L — ABNORMAL LOW (ref 3.5–5.1)
Sodium: 134 meq/L — ABNORMAL LOW (ref 135–145)
Total Bilirubin: 9.8 mg/dL — ABNORMAL HIGH (ref 0.2–1.2)
Total Protein: 6.5 g/dL (ref 6.0–8.3)

## 2024-08-29 LAB — PROTIME-INR
INR: 2.6 ratio — ABNORMAL HIGH (ref 0.8–1.0)
Prothrombin Time: 26.3 s — ABNORMAL HIGH (ref 9.6–13.1)

## 2024-08-30 ENCOUNTER — Encounter: Payer: Self-pay | Admitting: Physician Assistant

## 2024-08-30 ENCOUNTER — Ambulatory Visit: Admitting: Physician Assistant

## 2024-08-30 VITALS — BP 100/62 | HR 62 | Ht 63.0 in | Wt 126.0 lb

## 2024-08-30 DIAGNOSIS — D696 Thrombocytopenia, unspecified: Secondary | ICD-10-CM

## 2024-08-30 DIAGNOSIS — E871 Hypo-osmolality and hyponatremia: Secondary | ICD-10-CM | POA: Diagnosis not present

## 2024-08-30 DIAGNOSIS — K7031 Alcoholic cirrhosis of liver with ascites: Secondary | ICD-10-CM

## 2024-08-30 DIAGNOSIS — D6800 Von Willebrand disease, unspecified: Secondary | ICD-10-CM

## 2024-08-30 DIAGNOSIS — K7010 Alcoholic hepatitis without ascites: Secondary | ICD-10-CM

## 2024-08-30 DIAGNOSIS — K7030 Alcoholic cirrhosis of liver without ascites: Secondary | ICD-10-CM

## 2024-08-30 DIAGNOSIS — D682 Hereditary deficiency of other clotting factors: Secondary | ICD-10-CM | POA: Diagnosis not present

## 2024-08-30 DIAGNOSIS — R188 Other ascites: Secondary | ICD-10-CM

## 2024-08-30 DIAGNOSIS — K7682 Hepatic encephalopathy: Secondary | ICD-10-CM

## 2024-08-30 DIAGNOSIS — I864 Gastric varices: Secondary | ICD-10-CM

## 2024-08-30 DIAGNOSIS — K7290 Hepatic failure, unspecified without coma: Secondary | ICD-10-CM

## 2024-08-30 DIAGNOSIS — E876 Hypokalemia: Secondary | ICD-10-CM

## 2024-08-30 DIAGNOSIS — E039 Hypothyroidism, unspecified: Secondary | ICD-10-CM

## 2024-08-30 DIAGNOSIS — I8500 Esophageal varices without bleeding: Secondary | ICD-10-CM | POA: Diagnosis not present

## 2024-08-30 DIAGNOSIS — F1010 Alcohol abuse, uncomplicated: Secondary | ICD-10-CM

## 2024-08-30 MED ORDER — FUROSEMIDE 20 MG PO TABS: 20.0000 mg | ORAL_TABLET | Freq: Every day | ORAL | 1 refills | Status: DC

## 2024-08-30 MED ORDER — SPIRONOLACTONE 50 MG PO TABS: 50.0000 mg | ORAL_TABLET | Freq: Every day | ORAL | 3 refills | Status: DC

## 2024-08-30 MED ORDER — MIDODRINE HCL 5 MG PO TABS: 5.0000 mg | ORAL_TABLET | Freq: Two times a day (BID) | ORAL | 0 refills | Status: DC

## 2024-08-30 MED ORDER — PROPRANOLOL HCL 10 MG PO TABS: 10.0000 mg | ORAL_TABLET | Freq: Two times a day (BID) | ORAL | 0 refills | Status: DC

## 2024-08-30 NOTE — Patient Instructions (Addendum)
 You have been scheduled for an abdominal paracentesis at [facility] radiology (1st floor of hospital) on Monday, 09-03-24 at 8:30am. Please arrive at least 30 minutes prior to your appointment time for registration. Should you need to reschedule this appointment for any reason, please call our office at [phone].  ___________________________________________________________________________________________  Please come back to the basement of our building and get labs that are in epic.  You do not need an appointment.  Our office is located at [facility] office at [address] [place], [place] [address].  You can come anytime to the basement of our building  Monday through friday between the hours of 7:30 am and 4:00 pm to have labs drawn.   NO NSAIDS (ibuprofen , advil , naproxen, aleve, motrin ...)  HEPATIC ENCEPHALOPATHY HEPATIC ENCEPHALOPATHY: Confusion caused by a build up of toxins in the blood due to the liver not being able to filter toxins. This can cause confusion mild or severe, increase falls. If you have been diagnosed with Hepatic encephalopathy, advised to not drive due to increased risk   LACTULOSE :  helps pull ammonia and other toxins from the blood into your stool when you have a bowel movement NO NEED TO CHECK AMMONIA LEVEL IN BLOOD! Only need to check if you are having symptoms. 30-80ml up to four times a day Take a dose in the morning If by lunch time you have not had AT LEAST 2 bowel movements take another dose If by dinner you have not had AT LEAST 2 bowel movements that day take another dose If by bedtime you still have not had 2 bowel movements take another dose Goal of 2-3 bowel movements per day Avoid taking with food as this will cause more gas  IF YOU ARE VERY SLEEPY, HARD FOR YOUR FAMILY TO WAKE YOU UP, OR FALLING ASLEEP DURING CONVERSATIONS -INCREASE LACTULOSE /GO TO THE EMERGENCY ROOM   SUPPLEMENTS: multivitamin Zinc Branch Chain Amino acids  (BCAA): 12 grams/day L-Ornithine L-Aspartate (LOLA): 6 grams three times/day ([email or web address]) L-Carnitine  PHYSICAL ACTIVITY It is important to continue to be active when you have cirrhosis. Exercise will help reduce muscle loss and weakness.  DISCUSS REFERRAL FOR PHYSICAL THERAPY WITH YOUR PRIMARY CARE PROVIDER  DIET/NUTRITION FOR CIRRHOSIS NO ALCOHOL YOUR GOALS Evening snack - high protein Supplements between meals to help meet calorie and protein goal: Boost Ensure Premier Protein Shakes Protein Greek yogurt Fish, chicken (NO RAW OR UNDERCOOKED FISH/SHELLFISH) Avoid pork and red meat Plant based protein (non-soy)/Vegan: Lentils, Chickpeas, Peanuts (non salted), almonds (non salted), quinoa, chia seeds  Plant based protein supplements (not soy)  Avoid/limit animal based protein supplements: whey, casein 4.  Low sodium (2,000 mg/day) A. Avoid: table salt, canned foods, deli meats, sausages, hot dogs, anything with a long shelf life B. Read nutrition labels and be aware of serving size. Don't go by percent of daily value.  Thank you for entrusting me with your care and for choosing [facility], Alan Coombs, P.A.-C  You have been scheduled for a follow up appointment on Tuesday, 10-02-24 at 9:20am. Please arrive 10 minutes early for registration. If you need to reschedule or cancel this appointment please call [phone] as soon as possible. Thank you.  _______________________________________________________  If your blood pressure at your visit was 140/90 or greater, please contact your primary care physician to follow up on this.  _______________________________________________________  If you are age 6 or older, your body mass index should be between 23-30. Your Body mass index is 22.32 kg/m. If this  is out of the aforementioned range listed, please consider follow up with your Primary Care Provider.  If you are age 78 or younger, your body  mass index should be between 19-25. Your Body mass index is 22.32 kg/m. If this is out of the aformentioned range listed, please consider follow up with your Primary Care Provider.   ________________________________________________________  The [facility] providers would like to encourage you to use MYCHART to communicate with providers for non-urgent requests or questions.  Due to long hold times on the telephone, sending your provider a message by [facility] may be a faster and more efficient way to get a response.  Please allow 48 business hours for a response.  Please remember that this is for non-urgent requests.  _______________________________________________________  [facility] is using a team-based approach to care.  Your team is made up of your doctor and two to three APPS. Our APPS (Nurse Practitioners and Physician Assistants) work with your physician to ensure care continuity for you. They are fully qualified to address your health concerns and develop a treatment plan. They communicate directly with your gastroenterologist to care for you. Seeing the Advanced Practice Practitioners on your physician's team can help you by facilitating care more promptly, often allowing for earlier appointments, access to diagnostic testing, procedures, and other specialty referrals.

## 2024-08-31 ENCOUNTER — Inpatient Hospital Stay (HOSPITAL_BASED_OUTPATIENT_CLINIC_OR_DEPARTMENT_OTHER): Admitting: Hematology and Oncology

## 2024-08-31 ENCOUNTER — Inpatient Hospital Stay

## 2024-08-31 VITALS — BP 136/62 | HR 62 | Temp 97.6°F | Resp 13 | Wt 126.1 lb

## 2024-08-31 DIAGNOSIS — D696 Thrombocytopenia, unspecified: Secondary | ICD-10-CM | POA: Insufficient documentation

## 2024-08-31 DIAGNOSIS — K7030 Alcoholic cirrhosis of liver without ascites: Secondary | ICD-10-CM

## 2024-08-31 DIAGNOSIS — E039 Hypothyroidism, unspecified: Secondary | ICD-10-CM | POA: Insufficient documentation

## 2024-08-31 DIAGNOSIS — D689 Coagulation defect, unspecified: Secondary | ICD-10-CM | POA: Insufficient documentation

## 2024-08-31 DIAGNOSIS — K767 Hepatorenal syndrome: Secondary | ICD-10-CM | POA: Diagnosis not present

## 2024-08-31 DIAGNOSIS — A4150 Gram-negative sepsis, unspecified: Secondary | ICD-10-CM | POA: Diagnosis not present

## 2024-08-31 DIAGNOSIS — H9222 Otorrhagia, left ear: Secondary | ICD-10-CM

## 2024-08-31 DIAGNOSIS — K7460 Unspecified cirrhosis of liver: Secondary | ICD-10-CM | POA: Insufficient documentation

## 2024-08-31 NOTE — Progress Notes (Unsigned)
 [facility] [phone]   [phone]  INITIAL CONSULT NOTE  Patient Care Team: Auston Opal, DO as PCP - General (Family Medicine)  Hematological/Oncological History # Concern for Von Willebrand Disease # Coagulopathy 08/31/2024: establish care with Dr. Federico   CHIEF COMPLAINTS/PURPOSE OF CONSULTATION:  Coagulopathy   HISTORY OF PRESENTING ILLNESS:  Rachel Lucas 63 y.o. female with medical history significant for hypothyroidism and cirrhosis of the liver who presents for evaluation of coagulopathy, concerning for von Willebrand's disease.   On review of the previous records Ms. Patient had von Willebrand studies ordered while in the hospital on 08/13/2024.  Those results showed factor VIII levels of 335, ristocetin cofactor greater than 300, and plasma von Willebrand antigen at 125.  These results are not consistent with von Willebrand's disease.  Additionally her PTT was 24.4 with an INR of 2.1.  Due to concern for these findings the patient was referred to hematology for further evaluation and management.  On exam today Rachel Lucas is accompanied by her husband.  She reports that she has a history of von Willebrand's disease from her childhood.  She reports that her mother had issues with hemorrhages and her maternal grandfather also had hemophilia.  She reports that her mother was diagnosed with von Willebrand's disease as which she in her early 31s.  She reports that she very easily bruises and if hit in the face or a slight bump develops bruising.  She reports she is also prone to nosebleeds and had some ear bleeding in the hospital.  Additionally at that time she reports she was having thrombocytopenia.  She notes that she did have a crown removed on a tooth back in late March or early April and at that time had a very large blood clot developed around that area.  She reports that she has had no major surgeries in the past.  She is  currently under the care of gastroenterology for her cirrhosis.  Rachel Lucas reports today that she has no children though she did want 1.  She and her husband tried unsuccessfully to have a child.  She reports that she is the youngest of 6 children.  She had a sister die during the COVID pandemic.  She reports that she is a non-smoker and does not drink any alcohol.  She notes that she previously worked for [employer].  She otherwise denies any fevers, chills, sweats, nausea, vomiting or diarrhea.  A full 10 point ROS is otherwise negative.  MEDICAL HISTORY:  Past Medical History:  Diagnosis Date   Hypothyroidism    Thyroid  disease    Von Willebrand disease (HCC)     SURGICAL HISTORY: Past Surgical History:  Procedure Laterality Date   CYST REMOVAL NECK  1995    SOCIAL HISTORY: Social History   Socioeconomic History   Marital status: Married    Spouse name: Shakina Choy   Number of children: 0   Years of education: 12   Highest education level: Bachelor's degree (e.g., BA, AB, BS)  Occupational History   Not on file  Tobacco Use   Smoking status: Former    Current packs/day: 0.00    Average packs/day: 0.2 packs/day for 10.0 years (2.0 ttl pk-yrs)    Types: Cigarettes    Start date: 12/21/1979    Quit date: 12/20/1989    Years since quitting: 34.7   Smokeless tobacco: Not on file   Tobacco comments:    Pt also notes passive exposure- father smoked  in house growing up.   Vaping Use   Vaping status: Never Used  Substance and Sexual Activity   Alcohol use: Not Currently   Drug use: Never   Sexual activity: Yes    Birth control/protection: None  Other Topics Concern   Not on file  Social History Narrative   Not on file   Social Drivers of Health   Financial Resource Strain: Not on file  Food Insecurity: No Food Insecurity (08/31/2024)   Hunger Vital Sign    Worried About Running Out of Food in the Last Year: Never true    Ran Out of Food in the Last Year: Never true   Transportation Needs: No Transportation Needs (08/31/2024)   PRAPARE - Administrator, Civil Service (Medical): No    Lack of Transportation (Non-Medical): No  Physical Activity: Not on file  Stress: Not on file  Social Connections: Not on file  Intimate Partner Violence: Not At Risk (08/31/2024)   Humiliation, Afraid, Rape, and Kick questionnaire    Fear of Current or Ex-Partner: No    Emotionally Abused: No    Physically Abused: No    Sexually Abused: No    FAMILY HISTORY: Family History  Problem Relation Age of Onset   Asthma Mother    Eczema Mother    Heart Problems Father     ALLERGIES:  is allergic to levofloxacin and sulfonamide derivatives.  MEDICATIONS:  Current Outpatient Medications  Medication Sig Dispense Refill   ciprofloxacin -dexamethasone  (CIPRODEX ) OTIC suspension 4 drops into left ear Otic Twice a day     folic acid  (FOLVITE ) 1 MG tablet Take 1 tablet (1 mg total) by mouth daily. 30 tablet 0   furosemide  (LASIX ) 20 MG tablet Take 1 tablet (20 mg total) by mouth daily. 90 tablet 1   lactulose  (CHRONULAC ) 10 GM/15ML solution 15 mL as needed Orally twice a day     lactulose , encephalopathy, (ENULOSE ) 10 GM/15ML SOLN Take 15 mLs (10 g total) by mouth 2 (two) times daily. 946 mL 2   levothyroxine  (SYNTHROID ) 88 MCG tablet Take 1 tablet (88 mcg total) by mouth daily before breakfast. 30 tablet 1   midodrine  (PROAMATINE ) 5 MG tablet Take 1 tablet (5 mg total) by mouth 2 (two) times daily with a meal. 60 tablet 0   potassium chloride  SA (KLOR-CON  M) 20 MEQ tablet Take 1 tablet (20 mEq total) by mouth daily for 2 days. 2 tablet 0   propranolol  (INDERAL ) 10 MG tablet Take 1 tablet (10 mg total) by mouth 2 (two) times daily. 30 tablet 0   spironolactone  (ALDACTONE ) 50 MG tablet Take 1 tablet (50 mg total) by mouth daily. 30 tablet 3   thiamine  (VITAMIN B1) 100 MG tablet Take 1 tablet (100 mg total) by mouth daily. 30 tablet 0   No current  facility-administered medications for this visit.    REVIEW OF SYSTEMS:   Constitutional: ( - ) fevers, ( - )  chills , ( - ) night sweats Eyes: ( - ) blurriness of vision, ( - ) double vision, ( - ) watery eyes Ears, nose, mouth, throat, and face: ( - ) mucositis, ( - ) sore throat Respiratory: ( - ) cough, ( - ) dyspnea, ( - ) wheezes Cardiovascular: ( - ) palpitation, ( - ) chest discomfort, ( - ) lower extremity swelling Gastrointestinal:  ( - ) nausea, ( - ) heartburn, ( - ) change in bowel habits Skin: ( - ) abnormal skin  rashes Lymphatics: ( - ) new lymphadenopathy, ( - ) easy bruising Neurological: ( - ) numbness, ( - ) tingling, ( - ) new weaknesses Behavioral/Psych: ( - ) mood change, ( - ) new changes  All other systems were reviewed with the patient and are negative.  PHYSICAL EXAMINATION:  Vitals:   08/31/24 0944  BP: 136/62  Pulse: 62  Resp: 13  Temp: 97.6 F (36.4 C)  SpO2: 100%   Filed Weights   08/31/24 0944  Weight: 126 lb 1.6 oz (57.2 kg)    GENERAL: well appearing elderly Caucasian female, in NAD  SKIN: Jaundiced, but otherwise texture, turgor are normal, no rashes or significant lesions EYES: conjunctiva are pink and non-injected, sclera clear LUNGS: clear to auscultation and percussion with normal breathing effort HEART: regular rate & rhythm and no murmurs and no lower extremity edema Musculoskeletal: no cyanosis of digits and no clubbing  PSYCH: alert & oriented x 3, fluent speech NEURO: no focal motor/sensory deficits  LABORATORY DATA:  I have reviewed the data as listed    Latest Ref Rng & Units 08/29/2024    2:00 PM 08/21/2024   10:57 AM 08/15/2024    5:02 AM  CBC  WBC 4.0 - 10.5 K/uL 13.6  14.9  9.7   Hemoglobin 12.0 - 15.0 g/dL 88.5  87.6  88.7   Hematocrit 36.0 - 46.0 % 34.2  36.9  32.8   Platelets 150.0 - 400.0 K/uL 102.0  103.0  110        Latest Ref Rng & Units 08/29/2024    2:00 PM 08/21/2024   10:57 AM 08/15/2024    5:02 AM  CMP   Glucose 70 - 99 mg/dL 896  88  886   BUN 6 - 23 mg/dL 12  14  8    Creatinine 0.40 - 1.20 mg/dL 9.30  9.39  9.36   Sodium 135 - 145 mEq/L 134  138  134   Potassium 3.5 - 5.1 mEq/L 3.4  3.2  3.8   Chloride 96 - 112 mEq/L 100  102  98   CO2 19 - 32 mEq/L 28  28  25    Calcium 8.4 - 10.5 mg/dL 8.4  8.7  8.5   Total Protein 6.0 - 8.3 g/dL 6.5  7.1  6.7   Total Bilirubin 0.2 - 1.2 mg/dL 9.8  89.6  7.1   Alkaline Phos 39 - 117 U/L 156  123  118   AST 0 - 37 U/L 142  148  140   ALT 0 - 35 U/L 69  89  56      ASSESSMENT & PLAN Rachel Lucas 63 y.o. female with medical history significant for hypothyroidism and cirrhosis of the liver who presents for evaluation of coagulopathy, concerning for von Willebrand's disease.   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  I strongly suspect that she may have had the disease in the past but von Willebrand levels do increase over the course of ones life and it is possible to outgrow the condition. -- The patient likely does have coagulopathy from her liver disease with elevated INR, elevated PT, and thrombocytopenia.  Recommend taking standard cirrhosis/liver disease  precautions to prevent bleeding for endoscopic and surgical procedures. -- No need for routine follow-up in our clinic, though we are happy to help if she develops worsening thrombocytopenia or hematological issues.  No orders of the defined types were placed in this encounter.   All questions were answered. The patient knows to call the clinic with any problems, questions or concerns.  A total of more than 60 minutes were spent on this encounter with  face-to-face time and non-face-to-face time, including preparing to see the patient, ordering tests and/or medications, counseling the patient and coordination of care as outlined above.   Rachel IVAR Kidney, MD Department of Hematology/Oncology [facility] Phone: [phone] Pager: [phone] Email: [email or web address]  09/02/2024 6:41 PM

## 2024-09-02 NOTE — Progress Notes (Signed)
 I agree with the assessment and plan as outlined by Ms. Craig. Based upon her improved platelet count and her recent hematology visit that seems to suggest she no longer has von Willebrand disease, would be reasonable to proceed with EGD for variceal screening in the future. Could also do a colonoscopy at the same time since it is likely that this will be needed as part of liver transplant evaluation. Sent a message to Dr. Manuelita Novak with [facility] about this patient to make her aware of this referral.

## 2024-09-02 NOTE — Progress Notes (Signed)
 Dear Dr. Auston, Here is my assessment for our mutual patient, Rachel Lucas. Thank you for allowing me the opportunity to care for your patient. Please do not hesitate to contact me should you have any other questions. Sincerely, Dr. Eldora Blanch  Otolaryngology Clinic Note Referring provider: Dr. Auston HPI:  Rachel Lucas is a 63 y.o. female kindly referred by Dr. Auston for evaluation of left ear injury and concern for tympanic membrane perforation  Initially seen in August 2025 for left ear bleeding after qtip use and with subsequent bleeding requiring multiple packings, eventually stopped with surgicel and xeroform packing on 08/13/2024. She is noted to be coagulopathic due to liver dysfunction, possible vWD. --------------------------------------------------------- 08/23/2024 Returns for follow up. She reports no bleeding from her ear currently. She is keeping her ear dry. Continues to work with Hepatology. She has been using otic drops. No vertigo, headaches, meningitis sx  Prior to this instance, she denies history of: frequent ear infections, ear pain, fullness, vertigo, drainage, tinnitus Patient additionally denies: deep pain in ear canal, eustachian tube symptoms such as popping, crackling, sensitive to pressure changes Patient also denies barotrauma, vestibular suppressant use, ototoxic medication use Prior ear surgery: no  ENT Surgery: denies  AP/AC: no  Tobacco: former, quit. Alcohol: yes  PMHx: vWD, Cirrhosis, Hypothyroidism  Independent Review of Additional Tests or Records:  CT Temporal bones without contrast 08/08/2024 independently interpreted, agree with read - left EAC opacification, partial left ME opacification without ossicular erosion; b/l mastoids well aerated CBC 08/09/2024: WBC 7.5, Plt 43 08/21/2024 CBC and PT/INR and CMP: WBC 14.9, Plt 103, Hgb ~12, BUN/Cr 14/0.6, Tbili 10, ADT/ALT/Alk Phos elev, PT/INR 25/2.5 Dr. Federico notes 08/21/2024 reviewed: noted  liver not responding to steroids, could recover with additional time; holding off on transplant appt  PMH/Meds/All/SocHx/FamHx/ROS:   Past Medical History:  Diagnosis Date   Hypothyroidism    Thyroid  disease    Von Willebrand disease (HCC)      Past Surgical History:  Procedure Laterality Date   CYST REMOVAL NECK  1995    Family History  Problem Relation Age of Onset   Asthma Mother    Eczema Mother    Heart Problems Father      Social Connections: Not on file      Current Outpatient Medications:    ciprofloxacin -dexamethasone  (CIPRODEX ) OTIC suspension, 4 drops into left ear Otic Twice a day, Disp: , Rfl:    folic acid  (FOLVITE ) 1 MG tablet, Take 1 tablet (1 mg total) by mouth daily., Disp: 30 tablet, Rfl: 0   lactulose  (CHRONULAC ) 10 GM/15ML solution, 15 mL as needed Orally twice a day, Disp: , Rfl:    lactulose , encephalopathy, (ENULOSE ) 10 GM/15ML SOLN, Take 15 mLs (10 g total) by mouth 2 (two) times daily., Disp: 946 mL, Rfl: 2   levothyroxine  (SYNTHROID ) 88 MCG tablet, Take 1 tablet (88 mcg total) by mouth daily before breakfast., Disp: 30 tablet, Rfl: 1   potassium chloride  SA (KLOR-CON  M) 20 MEQ tablet, Take 1 tablet (20 mEq total) by mouth daily for 2 days., Disp: 2 tablet, Rfl: 0   thiamine  (VITAMIN B1) 100 MG tablet, Take 1 tablet (100 mg total) by mouth daily., Disp: 30 tablet, Rfl: 0   furosemide  (LASIX ) 20 MG tablet, Take 1 tablet (20 mg total) by mouth daily., Disp: 90 tablet, Rfl: 1   midodrine  (PROAMATINE ) 5 MG tablet, Take 1 tablet (5 mg total) by mouth 2 (two) times daily with a meal., Disp: 60 tablet, Rfl: 0  propranolol  (INDERAL ) 10 MG tablet, Take 1 tablet (10 mg total) by mouth 2 (two) times daily., Disp: 30 tablet, Rfl: 0   spironolactone  (ALDACTONE ) 50 MG tablet, Take 1 tablet (50 mg total) by mouth daily., Disp: 30 tablet, Rfl: 3   Physical Exam:   BP 116/80 (BP Location: Left Arm, Patient Position: Sitting, Cuff Size: Normal)   Pulse 68   Ht 5'  3 (1.6 m)   Wt 110 lb (49.9 kg)   SpO2 96%   BMI 19.49 kg/m   Salient findings:  CN II-XII intact Given history and complaints, ear microscopy was indicated and performed for evaluation with findings as below in physical exam section and in procedures - Right EAC clear and TM intact with well pneumatized middle ear spaces; on left, wicks absent, surgicel and xeroform packing removed -- significant amount of debris including bloody debris in EAC which was removed; after removal, appears she does have an approximately 50% perforation anteriorly with exposed IS(?) joint; some granulation in EAC so difficult to examine comprehensively; two small wicks placed in EAC and ciprodex  applied. Inferior left ear canal laceration. No evidence of left pulsatile fluid Weber 512: left Anterior rhinoscopy: Septum intact; bilateral inferior turbinates without significant hypertrophy No lesions of oral cavity/oropharynx No obviously palpable neck masses No respiratory distress or stridor Clear jaundice and sclera icteric  Seprately Identifiable Procedures:  Prior to initiating any procedures, risks/benefits/alternatives were explained to the patient and verbal consent obtained. Procedure: Bilateral ear microscopy and cerumen/debris removal using microscope (CPT [phone]) - Mod 25 Pre-procedure diagnosis: Ceruminous debris impaction left external ears Post-procedure diagnosis: same Indication: Ceruminous debris impaction left external ear; given patient's otologic complaints and history as well as for improved and comprehensive examination of external ear and tympanic membrane, bilateral otologic examination using microscope was performed and impacted cerumen removed  Procedure: Patient was placed semi-recumbent. Both ear canals were examined using the microscope with findings above. Left ear xeroform packing and surgicel removed from EAC. Bloody and ceruminous debris removed from [facility]; after removal, findings as  above. Two wicks placed in EAC and ciprodex  applied to wicks. Patient tolerated the procedure well.      Impression & Plans:  [name] is a 63 y.o. female with:  1. Perforation of left tympanic membrane   2. Laceration of left ear canal, subsequent encounter   3. Bleeding from left ear   4. Impacted cerumen of left ear   5. Coagulopathy (HCC)    Noted left ear canal and tympanic membrane trauma after q-tip use. Needs a safe/dry ear first. Coagulopathic. Will start with dry ear precautions, cortisporin drops 4 drops BID left ear. F/u in ~2 weeks. Will need ear to heal first. Avoid manipulation of EAC    Thank you for allowing me the opportunity to care for your patient. Please do not hesitate to contact me should you have any other questions.  Sincerely, Eldora Blanch, MD Otolaryngologist (ENT), [facility] Phone: [phone] Fax: [phone]  09/02/2024, 8:01 AM   MDM:  Level 4 - 99214 Complexity/Problems addressed: multiple acute problems Data complexity: mod - independent review of notes, labs - Morbidity: mod  - Prescription Drug prescribed or managed: y

## 2024-09-03 ENCOUNTER — Inpatient Hospital Stay (HOSPITAL_COMMUNITY)

## 2024-09-03 ENCOUNTER — Other Ambulatory Visit: Payer: Self-pay

## 2024-09-03 ENCOUNTER — Other Ambulatory Visit (HOSPITAL_COMMUNITY)

## 2024-09-03 ENCOUNTER — Telehealth: Payer: Self-pay | Admitting: Physician Assistant

## 2024-09-03 ENCOUNTER — Inpatient Hospital Stay (HOSPITAL_COMMUNITY)
Admission: EM | Admit: 2024-09-03 | Discharge: 2024-09-19 | DRG: 870 | Disposition: E | Attending: Pulmonary Disease | Admitting: Pulmonary Disease

## 2024-09-03 DIAGNOSIS — G9341 Metabolic encephalopathy: Secondary | ICD-10-CM | POA: Diagnosis present

## 2024-09-03 DIAGNOSIS — R6521 Severe sepsis with septic shock: Secondary | ICD-10-CM | POA: Diagnosis present

## 2024-09-03 DIAGNOSIS — E039 Hypothyroidism, unspecified: Secondary | ICD-10-CM | POA: Diagnosis present

## 2024-09-03 DIAGNOSIS — J80 Acute respiratory distress syndrome: Secondary | ICD-10-CM | POA: Diagnosis present

## 2024-09-03 DIAGNOSIS — D539 Nutritional anemia, unspecified: Secondary | ICD-10-CM | POA: Diagnosis present

## 2024-09-03 DIAGNOSIS — K7040 Alcoholic hepatic failure without coma: Secondary | ICD-10-CM | POA: Diagnosis present

## 2024-09-03 DIAGNOSIS — N186 End stage renal disease: Secondary | ICD-10-CM | POA: Diagnosis present

## 2024-09-03 DIAGNOSIS — Z1152 Encounter for screening for COVID-19: Secondary | ICD-10-CM | POA: Diagnosis not present

## 2024-09-03 DIAGNOSIS — N179 Acute kidney failure, unspecified: Secondary | ICD-10-CM

## 2024-09-03 DIAGNOSIS — R7881 Bacteremia: Secondary | ICD-10-CM | POA: Diagnosis not present

## 2024-09-03 DIAGNOSIS — D65 Disseminated intravascular coagulation [defibrination syndrome]: Secondary | ICD-10-CM | POA: Diagnosis present

## 2024-09-03 DIAGNOSIS — I5023 Acute on chronic systolic (congestive) heart failure: Secondary | ICD-10-CM | POA: Diagnosis present

## 2024-09-03 DIAGNOSIS — R918 Other nonspecific abnormal finding of lung field: Secondary | ICD-10-CM | POA: Diagnosis not present

## 2024-09-03 DIAGNOSIS — A419 Sepsis, unspecified organism: Secondary | ICD-10-CM | POA: Diagnosis not present

## 2024-09-03 DIAGNOSIS — M7989 Other specified soft tissue disorders: Secondary | ICD-10-CM | POA: Diagnosis present

## 2024-09-03 DIAGNOSIS — H6693 Otitis media, unspecified, bilateral: Secondary | ICD-10-CM | POA: Diagnosis present

## 2024-09-03 DIAGNOSIS — I2489 Other forms of acute ischemic heart disease: Secondary | ICD-10-CM | POA: Diagnosis present

## 2024-09-03 DIAGNOSIS — I953 Hypotension of hemodialysis: Secondary | ICD-10-CM | POA: Diagnosis not present

## 2024-09-03 DIAGNOSIS — I462 Cardiac arrest due to underlying cardiac condition: Secondary | ICD-10-CM | POA: Diagnosis present

## 2024-09-03 DIAGNOSIS — K7290 Hepatic failure, unspecified without coma: Secondary | ICD-10-CM | POA: Diagnosis not present

## 2024-09-03 DIAGNOSIS — E878 Other disorders of electrolyte and fluid balance, not elsewhere classified: Secondary | ICD-10-CM | POA: Diagnosis not present

## 2024-09-03 DIAGNOSIS — R579 Shock, unspecified: Secondary | ICD-10-CM

## 2024-09-03 DIAGNOSIS — R569 Unspecified convulsions: Secondary | ICD-10-CM | POA: Diagnosis not present

## 2024-09-03 DIAGNOSIS — K7031 Alcoholic cirrhosis of liver with ascites: Secondary | ICD-10-CM | POA: Diagnosis not present

## 2024-09-03 DIAGNOSIS — I5021 Acute systolic (congestive) heart failure: Secondary | ICD-10-CM | POA: Insufficient documentation

## 2024-09-03 DIAGNOSIS — R54 Age-related physical debility: Secondary | ICD-10-CM | POA: Diagnosis present

## 2024-09-03 DIAGNOSIS — R64 Cachexia: Secondary | ICD-10-CM | POA: Diagnosis present

## 2024-09-03 DIAGNOSIS — L139 Bullous disorder, unspecified: Secondary | ICD-10-CM | POA: Diagnosis present

## 2024-09-03 DIAGNOSIS — E861 Hypovolemia: Secondary | ICD-10-CM | POA: Diagnosis present

## 2024-09-03 DIAGNOSIS — B961 Klebsiella pneumoniae [K. pneumoniae] as the cause of diseases classified elsewhere: Secondary | ICD-10-CM | POA: Diagnosis present

## 2024-09-03 DIAGNOSIS — J9601 Acute respiratory failure with hypoxia: Secondary | ICD-10-CM | POA: Diagnosis not present

## 2024-09-03 DIAGNOSIS — K7682 Hepatic encephalopathy: Secondary | ICD-10-CM | POA: Diagnosis not present

## 2024-09-03 DIAGNOSIS — R4182 Altered mental status, unspecified: Secondary | ICD-10-CM | POA: Diagnosis not present

## 2024-09-03 DIAGNOSIS — K767 Hepatorenal syndrome: Principal | ICD-10-CM | POA: Diagnosis present

## 2024-09-03 DIAGNOSIS — E871 Hypo-osmolality and hyponatremia: Secondary | ICD-10-CM | POA: Diagnosis not present

## 2024-09-03 DIAGNOSIS — D509 Iron deficiency anemia, unspecified: Secondary | ICD-10-CM

## 2024-09-03 DIAGNOSIS — Z79899 Other long term (current) drug therapy: Secondary | ICD-10-CM

## 2024-09-03 DIAGNOSIS — D684 Acquired coagulation factor deficiency: Secondary | ICD-10-CM | POA: Diagnosis not present

## 2024-09-03 DIAGNOSIS — K7010 Alcoholic hepatitis without ascites: Secondary | ICD-10-CM | POA: Diagnosis not present

## 2024-09-03 DIAGNOSIS — Z825 Family history of asthma and other chronic lower respiratory diseases: Secondary | ICD-10-CM

## 2024-09-03 DIAGNOSIS — E43 Unspecified severe protein-calorie malnutrition: Secondary | ICD-10-CM | POA: Diagnosis present

## 2024-09-03 DIAGNOSIS — L03116 Cellulitis of left lower limb: Secondary | ICD-10-CM | POA: Diagnosis present

## 2024-09-03 DIAGNOSIS — J9811 Atelectasis: Secondary | ICD-10-CM | POA: Diagnosis present

## 2024-09-03 DIAGNOSIS — D72819 Decreased white blood cell count, unspecified: Secondary | ICD-10-CM | POA: Diagnosis present

## 2024-09-03 DIAGNOSIS — R1310 Dysphagia, unspecified: Secondary | ICD-10-CM | POA: Diagnosis present

## 2024-09-03 DIAGNOSIS — Z781 Physical restraint status: Secondary | ICD-10-CM

## 2024-09-03 DIAGNOSIS — E874 Mixed disorder of acid-base balance: Secondary | ICD-10-CM | POA: Diagnosis present

## 2024-09-03 DIAGNOSIS — R140 Abdominal distension (gaseous): Secondary | ICD-10-CM | POA: Diagnosis present

## 2024-09-03 DIAGNOSIS — Z882 Allergy status to sulfonamides status: Secondary | ICD-10-CM

## 2024-09-03 DIAGNOSIS — D6800 Von Willebrand disease, unspecified: Secondary | ICD-10-CM | POA: Diagnosis present

## 2024-09-03 DIAGNOSIS — E162 Hypoglycemia, unspecified: Secondary | ICD-10-CM | POA: Diagnosis not present

## 2024-09-03 DIAGNOSIS — Z66 Do not resuscitate: Secondary | ICD-10-CM | POA: Diagnosis present

## 2024-09-03 DIAGNOSIS — J90 Pleural effusion, not elsewhere classified: Secondary | ICD-10-CM | POA: Diagnosis not present

## 2024-09-03 DIAGNOSIS — F05 Delirium due to known physiological condition: Secondary | ICD-10-CM | POA: Diagnosis present

## 2024-09-03 DIAGNOSIS — H7092 Unspecified mastoiditis, left ear: Secondary | ICD-10-CM | POA: Diagnosis present

## 2024-09-03 DIAGNOSIS — Z992 Dependence on renal dialysis: Secondary | ICD-10-CM | POA: Diagnosis not present

## 2024-09-03 DIAGNOSIS — K7460 Unspecified cirrhosis of liver: Secondary | ICD-10-CM | POA: Diagnosis present

## 2024-09-03 DIAGNOSIS — Z6823 Body mass index (BMI) 23.0-23.9, adult: Secondary | ICD-10-CM

## 2024-09-03 DIAGNOSIS — I864 Gastric varices: Secondary | ICD-10-CM | POA: Diagnosis present

## 2024-09-03 DIAGNOSIS — I071 Rheumatic tricuspid insufficiency: Secondary | ICD-10-CM | POA: Diagnosis present

## 2024-09-03 DIAGNOSIS — N170 Acute kidney failure with tubular necrosis: Secondary | ICD-10-CM | POA: Diagnosis present

## 2024-09-03 DIAGNOSIS — D72829 Elevated white blood cell count, unspecified: Secondary | ICD-10-CM | POA: Diagnosis not present

## 2024-09-03 DIAGNOSIS — I429 Cardiomyopathy, unspecified: Secondary | ICD-10-CM | POA: Diagnosis present

## 2024-09-03 DIAGNOSIS — D649 Anemia, unspecified: Secondary | ICD-10-CM | POA: Diagnosis not present

## 2024-09-03 DIAGNOSIS — I8500 Esophageal varices without bleeding: Secondary | ICD-10-CM | POA: Diagnosis not present

## 2024-09-03 DIAGNOSIS — Z8249 Family history of ischemic heart disease and other diseases of the circulatory system: Secondary | ICD-10-CM

## 2024-09-03 DIAGNOSIS — H0520 Unspecified exophthalmos: Secondary | ICD-10-CM | POA: Diagnosis present

## 2024-09-03 DIAGNOSIS — D638 Anemia in other chronic diseases classified elsewhere: Secondary | ICD-10-CM | POA: Diagnosis not present

## 2024-09-03 DIAGNOSIS — E876 Hypokalemia: Secondary | ICD-10-CM | POA: Diagnosis present

## 2024-09-03 DIAGNOSIS — Z7189 Other specified counseling: Secondary | ICD-10-CM | POA: Diagnosis not present

## 2024-09-03 DIAGNOSIS — Z515 Encounter for palliative care: Secondary | ICD-10-CM | POA: Diagnosis not present

## 2024-09-03 DIAGNOSIS — Z881 Allergy status to other antibiotic agents status: Secondary | ICD-10-CM

## 2024-09-03 DIAGNOSIS — Z832 Family history of diseases of the blood and blood-forming organs and certain disorders involving the immune mechanism: Secondary | ICD-10-CM

## 2024-09-03 DIAGNOSIS — E785 Hyperlipidemia, unspecified: Secondary | ICD-10-CM | POA: Diagnosis present

## 2024-09-03 DIAGNOSIS — Z79890 Hormone replacement therapy (postmenopausal): Secondary | ICD-10-CM

## 2024-09-03 DIAGNOSIS — A4150 Gram-negative sepsis, unspecified: Secondary | ICD-10-CM | POA: Diagnosis not present

## 2024-09-03 DIAGNOSIS — E8721 Acute metabolic acidosis: Secondary | ICD-10-CM | POA: Diagnosis not present

## 2024-09-03 DIAGNOSIS — I480 Paroxysmal atrial fibrillation: Secondary | ICD-10-CM | POA: Diagnosis present

## 2024-09-03 DIAGNOSIS — R34 Anuria and oliguria: Secondary | ICD-10-CM | POA: Diagnosis not present

## 2024-09-03 DIAGNOSIS — I469 Cardiac arrest, cause unspecified: Secondary | ICD-10-CM | POA: Diagnosis not present

## 2024-09-03 DIAGNOSIS — Z862 Personal history of diseases of the blood and blood-forming organs and certain disorders involving the immune mechanism: Secondary | ICD-10-CM

## 2024-09-03 DIAGNOSIS — K7030 Alcoholic cirrhosis of liver without ascites: Secondary | ICD-10-CM | POA: Diagnosis not present

## 2024-09-03 DIAGNOSIS — R2243 Localized swelling, mass and lump, lower limb, bilateral: Secondary | ICD-10-CM | POA: Diagnosis not present

## 2024-09-03 DIAGNOSIS — K766 Portal hypertension: Secondary | ICD-10-CM | POA: Diagnosis not present

## 2024-09-03 DIAGNOSIS — R195 Other fecal abnormalities: Secondary | ICD-10-CM | POA: Diagnosis present

## 2024-09-03 DIAGNOSIS — Z8701 Personal history of pneumonia (recurrent): Secondary | ICD-10-CM

## 2024-09-03 DIAGNOSIS — K7011 Alcoholic hepatitis with ascites: Secondary | ICD-10-CM | POA: Diagnosis present

## 2024-09-03 DIAGNOSIS — K7469 Other cirrhosis of liver: Secondary | ICD-10-CM | POA: Diagnosis not present

## 2024-09-03 DIAGNOSIS — E8809 Other disorders of plasma-protein metabolism, not elsewhere classified: Secondary | ICD-10-CM | POA: Diagnosis present

## 2024-09-03 DIAGNOSIS — Z87891 Personal history of nicotine dependence: Secondary | ICD-10-CM

## 2024-09-03 DIAGNOSIS — R21 Rash and other nonspecific skin eruption: Secondary | ICD-10-CM | POA: Diagnosis not present

## 2024-09-03 DIAGNOSIS — I5020 Unspecified systolic (congestive) heart failure: Secondary | ICD-10-CM | POA: Diagnosis not present

## 2024-09-03 DIAGNOSIS — I4949 Other premature depolarization: Secondary | ICD-10-CM | POA: Diagnosis present

## 2024-09-03 DIAGNOSIS — D696 Thrombocytopenia, unspecified: Secondary | ICD-10-CM | POA: Diagnosis not present

## 2024-09-03 DIAGNOSIS — M79604 Pain in right leg: Secondary | ICD-10-CM | POA: Diagnosis present

## 2024-09-03 DIAGNOSIS — R609 Edema, unspecified: Secondary | ICD-10-CM | POA: Diagnosis not present

## 2024-09-03 LAB — RESP PANEL BY RT-PCR (RSV, FLU A&B, COVID)  RVPGX2
Influenza A by PCR: NEGATIVE
Influenza B by PCR: NEGATIVE
Resp Syncytial Virus by PCR: NEGATIVE
SARS Coronavirus 2 by RT PCR: NEGATIVE

## 2024-09-03 LAB — CBC WITH DIFFERENTIAL/PLATELET
Abs Immature Granulocytes: 0.01 K/uL (ref 0.00–0.07)
Basophils Absolute: 0 K/uL (ref 0.0–0.1)
Basophils Relative: 1 %
Eosinophils Absolute: 0 K/uL (ref 0.0–0.5)
Eosinophils Relative: 1 %
HCT: 30.7 % — ABNORMAL LOW (ref 36.0–46.0)
Hemoglobin: 9.8 g/dL — ABNORMAL LOW (ref 12.0–15.0)
Immature Granulocytes: 0 %
Lymphocytes Relative: 27 %
Lymphs Abs: 0.9 K/uL (ref 0.7–4.0)
MCH: 38.4 pg — ABNORMAL HIGH (ref 26.0–34.0)
MCHC: 31.9 g/dL (ref 30.0–36.0)
MCV: 120.4 fL — ABNORMAL HIGH (ref 80.0–100.0)
Monocytes Absolute: 0.1 K/uL (ref 0.1–1.0)
Monocytes Relative: 4 %
Neutro Abs: 2.2 K/uL (ref 1.7–7.7)
Neutrophils Relative %: 67 %
Platelets: 93 K/uL — ABNORMAL LOW (ref 150–400)
RBC: 2.55 MIL/uL — ABNORMAL LOW (ref 3.87–5.11)
RDW: 17.9 % — ABNORMAL HIGH (ref 11.5–15.5)
Smear Review: DECREASED
WBC: 3.3 K/uL — ABNORMAL LOW (ref 4.0–10.5)
nRBC: 1.2 % — ABNORMAL HIGH (ref 0.0–0.2)

## 2024-09-03 LAB — PROCALCITONIN: Procalcitonin: 30.61 ng/mL

## 2024-09-03 LAB — COMPREHENSIVE METABOLIC PANEL WITH GFR
ALT: 67 U/L — ABNORMAL HIGH (ref 0–44)
AST: 178 U/L — ABNORMAL HIGH (ref 15–41)
Albumin: 1.8 g/dL — ABNORMAL LOW (ref 3.5–5.0)
Alkaline Phosphatase: 91 U/L (ref 38–126)
Anion gap: 20 — ABNORMAL HIGH (ref 5–15)
BUN: 20 mg/dL (ref 6–20)
CO2: 12 mmol/L — ABNORMAL LOW (ref 22–32)
Calcium: 7.6 mg/dL — ABNORMAL LOW (ref 8.9–10.3)
Chloride: 98 mmol/L (ref 98–111)
Creatinine, Ser: 2.19 mg/dL — ABNORMAL HIGH (ref 0.44–1.00)
GFR, Estimated: 25 mL/min — ABNORMAL LOW (ref >60)
Glucose, Bld: 51 mg/dL — ABNORMAL LOW (ref 70–99)
Potassium: 3.7 mmol/L (ref 3.5–5.1)
Sodium: 130 mmol/L — ABNORMAL LOW (ref 135–145)
Total Bilirubin: 9.8 mg/dL — ABNORMAL HIGH (ref 0.0–1.2)
Total Protein: 5.4 g/dL — ABNORMAL LOW (ref 6.5–8.1)

## 2024-09-03 LAB — GLUCOSE, CAPILLARY
Glucose-Capillary: 97 mg/dL (ref 70–99)
Glucose-Capillary: 85 mg/dL (ref 70–99)
Glucose-Capillary: 211 mg/dL — ABNORMAL HIGH (ref 70–99)

## 2024-09-03 LAB — AMMONIA: Ammonia: 33 umol/L (ref 9–35)

## 2024-09-03 LAB — MRSA NEXT GEN BY PCR, NASAL: MRSA by PCR Next Gen: NOT DETECTED

## 2024-09-03 LAB — LIPASE, BLOOD: Lipase: 37 U/L (ref 11–51)

## 2024-09-03 LAB — PROTIME-INR
INR: 3.9 — ABNORMAL HIGH (ref 0.8–1.2)
Prothrombin Time: 40 s — ABNORMAL HIGH (ref 11.4–15.2)

## 2024-09-03 LAB — LACTIC ACID, PLASMA: Lactic Acid, Venous: 6.5 mmol/L (ref 0.5–1.9)

## 2024-09-03 MED ORDER — HYDROCORTISONE SOD SUC (PF) 100 MG IJ SOLR
100.0000 mg | Freq: Three times a day (TID) | INTRAMUSCULAR | Status: DC
  Administered 2024-09-03 – 2024-09-08 (×14): 100 mg via INTRAVENOUS
  Filled 2024-09-03 (×14): qty 2

## 2024-09-03 MED ORDER — SODIUM CHLORIDE 0.9 % IV SOLN
50.0000 ug/h | INTRAVENOUS | Status: DC
  Administered 2024-09-03: 50 ug/h via INTRAVENOUS
  Filled 2024-09-03: qty 1

## 2024-09-03 MED ORDER — PIPERACILLIN-TAZOBACTAM 3.375 G IVPB 30 MIN
3.3750 g | Freq: Once | INTRAVENOUS | Status: AC
  Administered 2024-09-03: 3.375 g via INTRAVENOUS
  Filled 2024-09-03: qty 50

## 2024-09-03 MED ORDER — SODIUM CHLORIDE 0.9 % IV SOLN: INTRAVENOUS | Status: AC | PRN

## 2024-09-03 MED ORDER — FOLIC ACID 1 MG PO TABS
1.0000 mg | ORAL_TABLET | Freq: Every day | ORAL | Status: DC
  Administered 2024-09-03: 1 mg via ORAL
  Filled 2024-09-03: qty 1

## 2024-09-03 MED ORDER — MIDODRINE HCL 5 MG PO TABS
10.0000 mg | ORAL_TABLET | Freq: Once | ORAL | Status: AC
  Administered 2024-09-03: 10 mg via ORAL
  Filled 2024-09-03: qty 2

## 2024-09-03 MED ORDER — POLYETHYLENE GLYCOL 3350 17 G PO PACK: 17.0000 g | PACK | Freq: Every day | ORAL | Status: DC | PRN

## 2024-09-03 MED ORDER — THIAMINE MONONITRATE 100 MG PO TABS
100.0000 mg | ORAL_TABLET | Freq: Every day | ORAL | Status: DC
  Administered 2024-09-03: 100 mg via ORAL
  Filled 2024-09-03: qty 1

## 2024-09-03 MED ORDER — DEXTROSE 50 % IV SOLN
1.0000 | Freq: Once | INTRAVENOUS | Status: AC
  Administered 2024-09-03: 50 mL via INTRAVENOUS
  Filled 2024-09-03: qty 50

## 2024-09-03 MED ORDER — ALBUMIN HUMAN 25 % IV SOLN
25.0000 g | Freq: Four times a day (QID) | INTRAVENOUS | Status: AC
  Administered 2024-09-03 – 2024-09-04 (×4): 25 g via INTRAVENOUS
  Filled 2024-09-03 (×4): qty 100

## 2024-09-03 MED ORDER — MIDODRINE HCL 5 MG PO TABS: 10.0000 mg | ORAL_TABLET | Freq: Three times a day (TID) | ORAL | Status: DC

## 2024-09-03 MED ORDER — NOREPINEPHRINE 4 MG/250ML-% IV SOLN
0.0000 ug/min | INTRAVENOUS | Status: DC
  Administered 2024-09-03: 22 ug/min via INTRAVENOUS
  Administered 2024-09-03: 10 ug/min via INTRAVENOUS
  Administered 2024-09-03: 40 ug/min via INTRAVENOUS
  Administered 2024-09-04: 5 ug/min via INTRAVENOUS
  Administered 2024-09-04: 14 ug/min via INTRAVENOUS
  Administered 2024-09-04: 24 ug/min via INTRAVENOUS
  Administered 2024-09-04: 20 ug/min via INTRAVENOUS
  Administered 2024-09-05: 6 ug/min via INTRAVENOUS
  Administered 2024-09-05: 10 ug/min via INTRAVENOUS
  Administered 2024-09-05 – 2024-09-06 (×2): 9 ug/min via INTRAVENOUS
  Filled 2024-09-03 (×11): qty 250

## 2024-09-03 MED ORDER — PANTOPRAZOLE SODIUM 40 MG IV SOLR
80.0000 mg | Freq: Once | INTRAVENOUS | Status: AC
Start: 2024-09-03 — End: 2024-09-03
  Administered 2024-09-03: 80 mg via INTRAVENOUS
  Filled 2024-09-03: qty 20

## 2024-09-03 MED ORDER — DOCUSATE SODIUM 100 MG PO CAPS: 100.0000 mg | ORAL_CAPSULE | Freq: Two times a day (BID) | ORAL | Status: DC | PRN

## 2024-09-03 MED ORDER — LACTATED RINGERS IV BOLUS
1000.0000 mL | Freq: Once | INTRAVENOUS | Status: AC
  Administered 2024-09-03: 1000 mL via INTRAVENOUS

## 2024-09-03 MED ORDER — SODIUM CHLORIDE 0.9% FLUSH
10.0000 mL | Freq: Two times a day (BID) | INTRAVENOUS | Status: DC
  Administered 2024-09-03: 40 mL
  Administered 2024-09-05 – 2024-09-06 (×3): 10 mL
  Administered 2024-09-07: 20 mL
  Administered 2024-09-07 – 2024-09-09 (×4): 10 mL
  Administered 2024-09-10: 30 mL
  Administered 2024-09-10 – 2024-09-11 (×2): 10 mL
  Administered 2024-09-11 – 2024-09-12 (×2): 30 mL
  Administered 2024-09-12: 20 mL
  Administered 2024-09-13: 10 mL
  Administered 2024-09-13: 20 mL
  Administered 2024-09-14 (×2): 10 mL
  Administered 2024-09-15: 30 mL
  Administered 2024-09-15 – 2024-09-16 (×2): 10 mL
  Administered 2024-09-16: 20 mL
  Administered 2024-09-17: 10 mL

## 2024-09-03 MED ORDER — PANTOPRAZOLE SODIUM 40 MG IV SOLR
40.0000 mg | Freq: Two times a day (BID) | INTRAVENOUS | Status: DC
  Administered 2024-09-03 – 2024-09-11 (×16): 40 mg via INTRAVENOUS
  Filled 2024-09-03 (×16): qty 10

## 2024-09-03 MED ORDER — SODIUM CHLORIDE 0.9% IV SOLUTION
Freq: Once | INTRAVENOUS | Status: AC
Start: 2024-09-03 — End: 2024-09-04

## 2024-09-03 MED ORDER — PIPERACILLIN-TAZOBACTAM 3.375 G IVPB
3.3750 g | Freq: Three times a day (TID) | INTRAVENOUS | Status: DC
  Administered 2024-09-04 – 2024-09-09 (×17): 3.375 g via INTRAVENOUS
  Filled 2024-09-03 (×18): qty 50

## 2024-09-03 MED ORDER — SODIUM CHLORIDE 0.9 % IV SOLN: 2.0000 g | Freq: Once | INTRAVENOUS | Status: DC

## 2024-09-03 MED ORDER — LEVOTHYROXINE SODIUM 88 MCG PO TABS
88.0000 ug | ORAL_TABLET | Freq: Every day | ORAL | Status: DC
  Filled 2024-09-03 (×2): qty 1

## 2024-09-03 MED ORDER — ORAL CARE MOUTH RINSE: 15.0000 mL | OROMUCOSAL | Status: DC | PRN

## 2024-09-03 MED ORDER — LINEZOLID 600 MG/300ML IV SOLN
600.0000 mg | Freq: Two times a day (BID) | INTRAVENOUS | Status: DC
  Administered 2024-09-03 – 2024-09-04 (×3): 600 mg via INTRAVENOUS
  Filled 2024-09-03 (×4): qty 300

## 2024-09-03 MED ORDER — OCTREOTIDE LOAD VIA INFUSION
50.0000 ug | Freq: Once | INTRAVENOUS | Status: AC
  Administered 2024-09-03: 50 ug via INTRAVENOUS
  Filled 2024-09-03: qty 25

## 2024-09-03 MED ORDER — OCTREOTIDE ACETATE 100 MCG/ML IJ SOLN
100.0000 ug | Freq: Three times a day (TID) | INTRAMUSCULAR | Status: DC
  Administered 2024-09-03 – 2024-09-06 (×7): 100 ug via SUBCUTANEOUS
  Filled 2024-09-03 (×11): qty 1

## 2024-09-03 MED ORDER — CHLORHEXIDINE GLUCONATE CLOTH 2 % EX PADS
6.0000 | MEDICATED_PAD | Freq: Every day | CUTANEOUS | Status: DC
  Administered 2024-09-03 – 2024-09-09 (×8): 6 via TOPICAL

## 2024-09-03 MED ORDER — LACTULOSE 10 GM/15ML PO SOLN
10.0000 g | Freq: Two times a day (BID) | ORAL | Status: DC
  Administered 2024-09-03: 10 g via ORAL
  Filled 2024-09-03 (×6): qty 15

## 2024-09-03 MED ORDER — SODIUM CHLORIDE 0.9% FLUSH: 10.0000 mL | INTRAVENOUS | Status: DC | PRN

## 2024-09-03 MED ORDER — VASOPRESSIN 20 UNITS/100 ML INFUSION FOR SHOCK
0.0300 [IU]/min | INTRAVENOUS | Status: DC
  Administered 2024-09-03 – 2024-09-08 (×12): 0.04 [IU]/min via INTRAVENOUS
  Administered 2024-09-08 – 2024-09-10 (×4): 0.03 [IU]/min via INTRAVENOUS
  Filled 2024-09-03 (×17): qty 100

## 2024-09-03 MED ORDER — MIDODRINE HCL 5 MG PO TABS
10.0000 mg | ORAL_TABLET | Freq: Three times a day (TID) | ORAL | Status: DC
  Administered 2024-09-03 – 2024-09-04 (×2): 10 mg via ORAL
  Filled 2024-09-03 (×3): qty 2

## 2024-09-03 MED ORDER — MIDODRINE HCL 5 MG PO TABS
10.0000 mg | ORAL_TABLET | Freq: Once | ORAL | Status: DC
  Filled 2024-09-03: qty 2

## 2024-09-03 NOTE — Consult Note (Addendum)
 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  She did not respond to steroids.  Currently being admitted with hypotension, concern for septic shock.   Need to rule out SBP, LLE cellulitis, UTI, left ear infection?SABRA MELD 3.0: 29 at 08/29/2024  2:00 PM.  She hasn't not had any Etoh since last admission  Left lower extremity erythema Developed a last night.    Reported episode of dark tarry stool  One episode last night. Resolved. Stool light brown today  Macrocytic anemia Hemoglobin 9.8, down slightly over last few days but overall stable over last several weeks.   AKI Creatinine 2.19 (  baseline 0.6)  ?  History of Von Willebrand's   PLAN:   --PCCM admitting to ICU for broad-spectrum antibiotics, infectious workup to include a diagnostic paracentesis.  --Levophed , midodrine  per PCCM --IV albumin  already ordered --Home diuretics are on hold. Hold beta blocker --2 g sodium restricted diet --Twice daily IV PPI --Trend H&H --Continue octreotide  for now.   --Continue lactulose  twice daily --ENsure TID -- Renal ultrasound was ordered -- When stable will likely proceed with inpatient EGD for varices screening and also due to the reports of a black BM last night.   HPI   Brief history  Oliviana was hospitalized in August with newly diagnosed decompensated cirrhosis with alcoholic hepatitis.  CT scan was consistent with cirrhosis with portal hypertension  / moderate ascites .  Initially she was not started on steroids due to Klebsiella bacteremia.  Liver tests continue to worsen.  Once it was felt she received adequate dose of  antibiotics, blood cultures were repeated and negative so prednisolone  was started. Also during that admission she had bleeding from her left ear.  She traumatized her tympanic membrane with a Q-tip.  ENT saw her in the hospital , packed the ear.  She continued to bleed periodically from her ear throughout that admission in the setting of severe thrombocytopenia which was treated with platelets transfusion and Vitamin K  .  Bleeding finally stopped, she was prescribed Cipro  drops to put in her ear . EGD for varices screening was not done inpatient. She was started empirically on propranolol  10 mg twice daily given her risk for bleeding .  She was also discharged home on midodrine  for hypotension .  After discharge labs checked at our office and prednisolone  was discontinued due to lack of response. Patient was seen in office for hospital follow up on 9/11. A diagnostic and therapeutic was ordered .  She was started on low-dose diuretics.  EGD for varices screening was scheduled . Referral was placed to [facility].   Interval history Earnestine tells me she had been doing fairly well since hospital discharge until last night when she suddenly became very dizzy.  She has not had any alcohol since her last hospitalization.  She started diuretics as prescribed by our office 2 to 3 days ago .  She has been taking midodrine  and beta-blocker twice daily as prescribed . Last night she began having severe chills last night.  Husband checked her temp and it was around 100 at 3 AM.  Isaiah has no cough, dysuria, abdominal pain.  She did not complete the full course of eardrops as they caused pain when she administered them into her ear. She does feel a little short of breath at times but thinks it is possibly due to abdominal distention.  She has developed erythema of her LLE and wonders about whether it is medication related ( ? Sulfa).  Patient has significant bilateral lower extremity edema.  Husband says she  is eating very little and doubts that she is even getting close to her sodium restriction  Relevant workup thus far: WBC 3.3 Globin 9.8 MCV 120 Platelets 93  Sodium 130 BUN 20  creatinine 2.19 Albumin  1.8 AST 178 ALT 67 Total bilirubin 9.8 INR is pending Renal ultrasound pending   Labs and Imaging:  Recent Labs    09/03/24 1320  PROT 5.4*  ALBUMIN  1.8*  AST 178*  ALT 67*  ALKPHOS 91  BILITOT 9.8*   Recent Labs    09/03/24 1320  WBC 3.3*  HGB 9.8*  HCT 30.7*  MCV 120.4*  PLT 93*   Recent Labs    09/03/24 1320  NA 130*  K 3.7  CL 98  CO2 12*  GLUCOSE 51*  BUN 20  CREATININE 2.19*  CALCIUM  7.6*     DG Chest Port 1 View CLINICAL DATA:  [id number] Sepsis [facility]) [id number]  EXAM: PORTABLE CHEST - 1 VIEW  COMPARISON:  08/13/2024  FINDINGS: Low lung volumes. Patchy airspace opacities in both lung bases. No pneumothorax. Unchanged trace right pleural effusion. Biapical pleural scarring. No cardiomegaly. Tortuous aorta with aortic atherosclerosis. No acute fracture or destructive lesions. Multilevel thoracic osteophytosis.  IMPRESSION: 1. Low lung volumes. Patchy airspace opacities in both lung bases, which may represent atelectasis or a developing bronchopneumonia, in the correct clinical context. 2. Unchanged trace right pleural effusion.  Electronically Signed   By: Rogelia Myers M.D.   On: 09/03/2024 17:18    Past Medical History:  Diagnosis Date   Hypothyroidism    Thyroid  disease    Von Willebrand disease (HCC)     Past Surgical History:  Procedure Laterality Date   CYST REMOVAL NECK  1995    Family History  Problem Relation Age of Onset   Asthma Mother    Eczema Mother    Heart Problems Father     Prior to Admission medications   Medication Sig Start Date End Date Taking? Authorizing Provider  folic acid  (FOLVITE ) 1 MG tablet Take 1 tablet (1 mg total) by mouth daily. 08/16/24  Yes Dennise Lavada POUR, MD  furosemide  (LASIX )  20 MG tablet Take 1 tablet (20 mg total) by mouth daily. 08/30/24  Yes Craig Palma R, PA-C  lactulose , encephalopathy, (ENULOSE ) 10 GM/15ML SOLN Take 15 mLs (10 g total) by mouth 2 (two) times daily. 08/21/24  Yes Federico Rosario BROCKS, MD  levothyroxine  (SYNTHROID ) 88 MCG tablet Take 1 tablet (88 mcg total) by mouth daily before breakfast. 08/15/24  Yes Singh, Prashant K, MD  midodrine  (PROAMATINE ) 5 MG tablet Take 1 tablet (5 mg total) by mouth 2 (two) times daily with a meal. 08/30/24  Yes Craig Palma SAUNDERS, PA-C  propranolol  (INDERAL ) 10 MG tablet Take 1 tablet (10 mg total) by mouth 2 (two) times daily. 08/30/24  Yes Craig Palma  R, PA-C  spironolactone  (ALDACTONE ) 50 MG tablet Take 1 tablet (50 mg total) by mouth daily. 08/30/24  Yes Craig Palma R, PA-C  thiamine  (VITAMIN B1) 100 MG tablet Take 1 tablet (100 mg total) by mouth daily. 08/16/24  Yes Singh, Prashant K, MD  potassium chloride  SA (KLOR-CON  M) 20 MEQ tablet Take 1 tablet (20 mEq total) by mouth daily for 2 days. 08/21/24 08/31/24  Federico Rosario BROCKS, MD    Current Facility-Administered Medications  Medication Dose Route Frequency Provider Last Rate Last Admin   albumin  human 25 % solution 25 g  25 g Intravenous Q6H Claudene Toribio BROCKS, MD       Chlorhexidine  Gluconate Cloth 2 % PADS 6 each  6 each Topical Daily Claudene Toribio BROCKS, MD       docusate sodium  (COLACE) capsule 100 mg  100 mg Oral BID PRN Smith, Daniel C, MD       folic acid  (FOLVITE ) tablet 1 mg  1 mg Oral Daily Claudene Toribio BROCKS, MD       lactated ringers  bolus 1,000 mL  1,000 mL Intravenous Once Claudene Toribio BROCKS, MD       linezolid  (ZYVOX ) IVPB 600 mg  600 mg Intravenous Q12H Claudene Toribio BROCKS, MD       midodrine  (PROAMATINE ) tablet 10 mg  10 mg Oral TID WC Claudene Toribio BROCKS, MD       norepinephrine  (LEVOPHED ) 4mg  in (0.016 mg/mL) premix infusion  0-40 mcg/min Intravenous Continuous Simon Lavonia SAILOR, MD 56.3 mL/hr at 09/03/24 1718 15 mcg/min at 09/03/24 1718   octreotide  (SANDOSTATIN )  injection 100 mcg  100 mcg Subcutaneous Q8H Claudene Toribio BROCKS, MD       pantoprazole  (PROTONIX ) injection 40 mg  40 mg Intravenous Q12H Claudene Toribio BROCKS, MD       piperacillin -tazobactam (ZOSYN ) IVPB 3.375 g  3.375 g Intravenous Once Lemly, Tatum N, MD       Followed by   NOREEN ON 09/04/2024] piperacillin -tazobactam (ZOSYN ) IVPB 3.375 g  3.375 g Intravenous Q8H Lemly, Tatum N, MD       polyethylene glycol (MIRALAX  / GLYCOLAX ) packet 17 g  17 g Oral Daily PRN Smith, Daniel C, MD       thiamine  (VITAMIN B1) tablet 100 mg  100 mg Oral Daily Claudene Toribio BROCKS, MD       Current Outpatient Medications  Medication Sig Dispense Refill   folic acid  (FOLVITE ) 1 MG tablet Take 1 tablet (1 mg total) by mouth daily. 30 tablet 0   furosemide  (LASIX ) 20 MG tablet Take 1 tablet (20 mg total) by mouth daily. 90 tablet 1   lactulose , encephalopathy, (ENULOSE ) 10 GM/15ML SOLN Take 15 mLs (10 g total) by mouth 2 (two) times daily. 946 mL 2   levothyroxine  (SYNTHROID ) 88 MCG tablet Take 1 tablet (88 mcg total) by mouth daily before breakfast. 30 tablet 1   midodrine  (PROAMATINE ) 5 MG tablet Take 1 tablet (5 mg total) by mouth 2 (two) times daily with a meal. 60 tablet 0   propranolol  (INDERAL ) 10 MG tablet Take 1 tablet (10 mg total) by mouth 2 (two) times daily. 30 tablet 0   spironolactone  (ALDACTONE ) 50 MG tablet Take 1 tablet (50 mg total) by mouth daily. 30 tablet 3   thiamine  (VITAMIN B1) 100 MG tablet Take 1 tablet (100 mg total) by mouth daily. 30 tablet 0   potassium chloride  SA (KLOR-CON  M) 20 MEQ tablet Take 1 tablet (20 mEq total) by mouth  daily for 2 days. 2 tablet 0    Allergies as of 09/03/2024 - Reviewed 09/03/2024  Allergen Reaction Noted   Levofloxacin Other (See Comments) 03/14/2009   Sulfonamide derivatives Palpitations 03/14/2009    Social History   Socioeconomic History   Marital status: Married    Spouse name: Elinda Bunten   Number of children: 0   Years of education: 12   Highest  education level: Bachelor's degree (e.g., BA, AB, BS)  Occupational History   Not on file  Tobacco Use   Smoking status: Former    Current packs/day: 0.00    Average packs/day: 0.2 packs/day for 10.0 years (2.0 ttl pk-yrs)    Types: Cigarettes    Start date: 12/21/1979    Quit date: 12/20/1989    Years since quitting: 34.7   Smokeless tobacco: Not on file   Tobacco comments:    Pt also notes passive exposure- father smoked in house growing up.   Vaping Use   Vaping status: Never Used  Substance and Sexual Activity   Alcohol use: Not Currently   Drug use: Never   Sexual activity: Yes    Birth control/protection: None  Other Topics Concern   Not on file  Social History Narrative   Not on file   Social Drivers of Health   Financial Resource Strain: Not on file  Food Insecurity: No Food Insecurity (08/31/2024)   Hunger Vital Sign    Worried About Running Out of Food in the Last Year: Never true    Ran Out of Food in the Last Year: Never true  Transportation Needs: No Transportation Needs (08/31/2024)   PRAPARE - Administrator, Civil Service (Medical): No    Lack of Transportation (Non-Medical): No  Physical Activity: Not on file  Stress: Not on file  Social Connections: Not on file  Intimate Partner Violence: Not At Risk (08/31/2024)   Humiliation, Afraid, Rape, and Kick questionnaire    Fear of Current or Ex-Partner: No    Emotionally Abused: No    Physically Abused: No    Sexually Abused: No     Code Status   Code Status: Full Code  Review of Systems: All systems reviewed and negative except where noted in HPI.  Physical Exam: Vital signs in last 24 hours: Pulse Rate:  [74-78] 74 (09/15 1700) Resp:  [19-22] 19 (09/15 1700) BP: (59-120)/(46-70) 73/52 (09/15 1721) SpO2:  [97 %] 97 % (09/15 1700) Weight:  [59 kg] 59 kg (09/15 1316)    General:  Pleasant female in NAD.  Deeply jaundiced Psych:  Cooperative. Normal mood and affect Eyes: Pupils  equal Ears:  Normal auditory acuity Nose: No deformity, discharge or lesions Neck:  Supple, no masses felt Lungs: Bibasilar crackles and diminished breath sounds at both bases .  Heart:  Regular rate, regular rhythm.  Abdomen:  Soft, moderately distended , nontender , active bowel sounds, no masses felt Rectal :  Deferred Msk: Symmetrical without gross deformities.  Neurologic:  Alert, oriented, grossly normal neurologically Extremities : Pitting edema of bilateral lower extremities .  Back of distal left lower extremity is erythematous from ankle to just above the back of her knee .     Intake/Output from previous day: No intake/output data recorded. Intake/Output this shift:  Total I/O In: 1000 [IV Piggyback:1000] Out: -    Vina Dasen, NP-C   09/03/2024, 5:22 PM  ---------------------------------------   Patient was in CT when I came by to see her, so I  was not able see her this evening.  Her presentation seems most consistent with septic shock.  Defer to PCCM team for management of septic shock with likely cellulitis as source, but agree with broad spectrum antibiotics, pan culture, paracentesis to rule out SBP.  Albumin  for volume expansion/AKI.  Hold betablockers/diuretics. Ok with midodrine /octreotide  although unlikely HRS given alternative explanation for AKI (septic shock).   No significant GI bleeding reported, so no plans for EGD urgently. Will follow with you.  Hurshel Bouillon E. Stacia, MD [facility]

## 2024-09-03 NOTE — ED Triage Notes (Signed)
 Pt BIB EMS for bliateral leg swelling, pain in left foot for past 3 days. Pt states one of meds she is prescribed has sulfa in it and she is allergic to sulfa meds. Diagnosed with cirrhosis 3 weeks ago, pt is visibly jaundiced. Pt states has not had a drink in 6 weeks. Pt also noticed dark stool yesterday. AOX4.

## 2024-09-03 NOTE — Telephone Encounter (Signed)
 Patient going to hospital via ambulance for likely decompensated cirrhosis concern for variceal bleeding. Inpatient team has been alerted.

## 2024-09-03 NOTE — Telephone Encounter (Signed)
 PT husband is calling to discuss PT's symptoms. She has heavy fluid on her legs. They are red and painful. She cannot walk. He feels she is having a reaction to the medication. She also has very black stools and fatigued. He wants to know if he should take her to the ED or if she can be seen today. Please advise.

## 2024-09-03 NOTE — H&P (Addendum)
 NAME:  Rachel Lucas, MRN:  [id number], DOB:  11-25-1961, LOS: 0 ADMISSION DATE:  09/03/2024, CONSULTATION DATE:  09/03/24 REFERRING MD:  EDP, CHIEF COMPLAINT:  leg pain   History of Present Illness:  63 year old woman with recently diagnosed alcoholic cirrhosis presenting with chills and leg swelling + redness progressive over past 24h.   Some question of melena but had 1x small dark stool yesterday and remainder have been normal including one she had during interview.  No cough, sinus congestion, hematemesis.  ++LE swelling on left + redness, hurt a lot this am but now looks and feels better.  Recent admit for kleb pna bacteremia in setting of accidental ear canal perforation with Q-tip (8/20-8/27).  States she has been compliant with lactulose  but not taking midodrine .  Baseline BP she states are in 90s systolic.  Here, BP 60/30s unresponsive to fluids, midodrine  and levophed  ordered PCCM to admit.  Pertinent  Medical History   Fam hx of VWD but test 8/25 neg Alcoholic cirrhosis new diagnosis  Significant Hospital Events: Including procedures, antibiotic start and stop dates in addition to other pertinent events   9/15 admit  Interim History / Subjective:  Admit  Objective    Blood pressure (!) 80/69, height 5' 3 (1.6 m), weight 59 kg.        Intake/Output Summary (Last 24 hours) at 09/03/2024 1648 Last data filed at 09/03/2024 1554 Gross per 24 hour  Intake 1000 ml  Output --  Net 1000 ml   Filed Weights   09/03/24 1316  Weight: 59 kg    Examination: General: chronically ill jaundiced woman HENT: scleral icterus, tracking Lungs: transmitted upper airway sounds Cardiovascular: regular, +murmur LLSB, ext warm Abdomen: protuberant, nontender, +BS, ++ascites mostly below umbillicus Extremities: Marked L>R swelling, violacious rash over medial and anterior shin, tender to deep but not light palpation, no proximal tracking Neuro: moves all ext, Aox3, no  asterixis  Labs/imaging reviewed  Resolved problem list   Assessment and Plan  Shock state- acute on chronic, background hepatic vasoplegia.  Initially some concern for GIB but had only 1 small dark stool and another in ER that was normal appearing.  With chills, leukopenia, LLE redness more concerned for sepsis from LLE cellulitis.  No obvious s/s of nec fasc but difficult to tell by clinical exam. Decompensated alcoholic cirrhosis with ascites- sober x 1 mo per husband/her Acute kidney injury- still looks dry intravascularly Chronic macrocytic anemia Recent admit for klebsiella bacteremia and accidental eardrum perforation Hypothyroidism- on PTA synthroid   - Another liter LR - Diagnostic para - Levophed  for MAP 65 - Hold spironolactone , propranolol  for now - Albumin , midodrine , subQ octreotide  in case HRS - Thiamine , folate - Check renal US  - CT LLE and CT ear to check on prior trauma area from last month - May need CVL - Check INR - GI has seen, no plans for scope unless actually showing s/s of bleeding, similarly no role for octreotide  drip at present (subQ serves similar anyway) - Linezolid +zosyn , follow culture data - Pct, lactate - Check RVP, COVID - Full code, confirmed with husband - Synthroid  - Appreciate GI recs - RLE SCD for DVT ppx for now  Labs   CBC: Recent Labs  Lab 08/29/24 1400 09/03/24 1320  WBC 13.6* 3.3*  NEUTROABS 10.2* 2.2  HGB 11.4* 9.8*  HCT 34.2* 30.7*  MCV 111.8 repeated to verify* 120.4*  PLT 102.0* 93*    Basic Metabolic Panel: Recent Labs  Lab 08/29/24  1400 09/03/24 1320  NA 134* 130*  K 3.4* 3.7  CL 100 98  CO2 28 12*  GLUCOSE 103* 51*  BUN 12 20  CREATININE 0.69 2.19*  CALCIUM  8.4 7.6*   GFR: Estimated Creatinine Clearance: 22.6 mL/min (A) (by C-G formula based on SCr of 2.19 mg/dL (H)). Recent Labs  Lab 08/29/24 1400 09/03/24 1320  WBC 13.6* 3.3*    Liver Function Tests: Recent Labs  Lab 08/29/24 1400  09/03/24 1320  AST 142* 178*  ALT 69* 67*  ALKPHOS 156* 91  BILITOT 9.8* 9.8*  PROT 6.5 5.4*  ALBUMIN  2.8* 1.8*   Recent Labs  Lab 09/03/24 1320  LIPASE 37   Recent Labs  Lab 09/03/24 1320  AMMONIA 33    ABG No results found for: PHART, PCO2ART, PO2ART, HCO3, TCO2, ACIDBASEDEF, O2SAT   Coagulation Profile: Recent Labs  Lab 08/29/24 1400  INR 2.6*    Cardiac Enzymes: No results for input(s): CKTOTAL, CKMB, CKMBINDEX, TROPONINI in the last 168 hours.  HbA1C: No results found for: HGBA1C  CBG: No results for input(s): GLUCAP in the last 168 hours.  Review of Systems:    Positive Symptoms in bold:  Constitutional fevers, chills, weight loss, fatigue, anorexia, malaise  Eyes decreased vision, double vision, eye irritation  Ears, Nose, Mouth, Throat sore throat, trouble swallowing, sinus congestion  Cardiovascular chest pain, paroxysmal nocturnal dyspnea, lower ext edema, palpitations   Respiratory SOB, cough, DOE, hemoptysis, wheezing  Gastrointestinal nausea, vomiting, diarrhea  Genitourinary burning with urination, trouble urinating  Musculoskeletal joint aches, joint swelling, back pain  Integumentary  rashes, skin lesions  Neurological focal weakness, focal numbness, trouble speaking, headaches  Psychiatric depression, anxiety, confusion  Endocrine polyuria, polydipsia, cold intolerance, heat intolerance  Hematologic abnormal bruising, abnormal bleeding, unexplained nose bleeds  Allergic/Immunologic recurrent infections, hives, swollen lymph nodes     Past Medical History:  She,  has a past medical history of Hypothyroidism, Thyroid  disease, and Von Willebrand disease (HCC).   Surgical History:   Past Surgical History:  Procedure Laterality Date   CYST REMOVAL NECK  1995     Social History:   reports that she quit smoking about 34 years ago. Her smoking use included cigarettes. She started smoking about 44 years ago. She  has a 2 pack-year smoking history. She does not have any smokeless tobacco history on file. She reports that she does not currently use alcohol. She reports that she does not use drugs.   Family History:  Her family history includes Asthma in her mother; Eczema in her mother; Heart Problems in her father.   Allergies Allergies  Allergen Reactions   Levofloxacin Other (See Comments)   Sulfonamide Derivatives Palpitations     Home Medications  Prior to Admission medications   Medication Sig Start Date End Date Taking? Authorizing Provider  ciprofloxacin -dexamethasone  (CIPRODEX ) OTIC suspension 4 drops into left ear Otic Twice a day 08/15/24   [provider]  folic acid  (FOLVITE ) 1 MG tablet Take 1 tablet (1 mg total) by mouth daily. 08/16/24   Dennise Lavada POUR, MD  furosemide  (LASIX ) 20 MG tablet Take 1 tablet (20 mg total) by mouth daily. 08/30/24   Craig Alan SAUNDERS, PA-C  lactulose  (CHRONULAC ) 10 GM/15ML solution 15 mL as needed Orally twice a day 08/15/24   [provider]  lactulose , encephalopathy, (ENULOSE ) 10 GM/15ML SOLN Take 15 mLs (10 g total) by mouth 2 (two) times daily. 08/21/24   Federico Rosario BROCKS, MD  levothyroxine  (SYNTHROID ) [phone]  MCG tablet Take 1 tablet (88 mcg total) by mouth daily before breakfast. 08/15/24   Singh, Prashant K, MD  midodrine  (PROAMATINE ) 5 MG tablet Take 1 tablet (5 mg total) by mouth 2 (two) times daily with a meal. 08/30/24   Craig Alan SAUNDERS, PA-C  potassium chloride  SA (KLOR-CON  M) 20 MEQ tablet Take 1 tablet (20 mEq total) by mouth daily for 2 days. 08/21/24 08/31/24  Federico Rosario BROCKS, MD  propranolol  (INDERAL ) 10 MG tablet Take 1 tablet (10 mg total) by mouth 2 (two) times daily. 08/30/24   Craig Alan SAUNDERS, PA-C  spironolactone  (ALDACTONE ) 50 MG tablet Take 1 tablet (50 mg total) by mouth daily. 08/30/24   Craig Alan SAUNDERS, PA-C  thiamine  (VITAMIN B1) 100 MG tablet Take 1 tablet (100 mg total) by mouth daily. 08/16/24   Dennise Lavada POUR, MD      Critical care time: 43 min cc time

## 2024-09-03 NOTE — ED Provider Notes (Signed)
 [facility] Provider Note   CSN: [id number] Arrival date & time: 09/03/24  1310     Patient presents with: Leg Swelling and Leg Pain   Rachel Lucas is a 63 y.o. female.   63 year old female with past medical history of end-stage liver disease presenting to the emergency department today with concern for lower extremity swelling.  The patient's husband brought her in for this.  She has not been feeling well overall in general the past few days.  She has not been eating or drinking much and has not taken her medications today due to this.  She is brought to the ER for further evaluation regarding this.  The patient has had some dark stools over the past few days.  Denies any abdominal pain.   Leg Pain      Prior to Admission medications   Medication Sig Start Date End Date Taking? Authorizing Provider  ciprofloxacin -dexamethasone  (CIPRODEX ) OTIC suspension 4 drops into left ear Otic Twice a day 08/15/24   [provider]  folic acid  (FOLVITE ) 1 MG tablet Take 1 tablet (1 mg total) by mouth daily. 08/16/24   Dennise Lavada POUR, MD  furosemide  (LASIX ) 20 MG tablet Take 1 tablet (20 mg total) by mouth daily. 08/30/24   Craig Alan SAUNDERS, PA-C  lactulose  (CHRONULAC ) 10 GM/15ML solution 15 mL as needed Orally twice a day 08/15/24   [provider]  lactulose , encephalopathy, (ENULOSE ) 10 GM/15ML SOLN Take 15 mLs (10 g total) by mouth 2 (two) times daily. 08/21/24   Federico Rosario BROCKS, MD  levothyroxine  (SYNTHROID ) 88 MCG tablet Take 1 tablet (88 mcg total) by mouth daily before breakfast. 08/15/24   Singh, Prashant K, MD  midodrine  (PROAMATINE ) 5 MG tablet Take 1 tablet (5 mg total) by mouth 2 (two) times daily with a meal. 08/30/24   Craig Alan SAUNDERS, PA-C  potassium chloride  SA (KLOR-CON  M) 20 MEQ tablet Take 1 tablet (20 mEq total) by mouth daily for 2 days. 08/21/24 08/31/24  Federico Rosario BROCKS, MD  propranolol  (INDERAL ) 10 MG tablet Take  1 tablet (10 mg total) by mouth 2 (two) times daily. 08/30/24   Craig Alan SAUNDERS, PA-C  spironolactone  (ALDACTONE ) 50 MG tablet Take 1 tablet (50 mg total) by mouth daily. 08/30/24   Craig Alan SAUNDERS, PA-C  thiamine  (VITAMIN B1) 100 MG tablet Take 1 tablet (100 mg total) by mouth daily. 08/16/24   Dennise Lavada POUR, MD    Allergies: Levofloxacin and Sulfonamide derivatives    Review of Systems  Constitutional:  Positive for activity change.  Cardiovascular:  Positive for leg swelling.  All other systems reviewed and are negative.   Updated Vital Signs BP (!) 80/69 (BP Location: Right Arm)   Ht 5' 3 (1.6 m)   Wt 59 kg   BMI 23.03 kg/m   Physical Exam Vitals and nursing note reviewed.   Gen: Jaundiced, chronically ill appearing Eyes: PERRL, EOMI, scleral icterus noted HEENT: no oropharyngeal swelling Neck: trachea midline Resp: clear to auscultation bilaterally Card: RRR, no murmurs, rubs, or gallops Abd: nontender, nondistended Extremities: 2+ pitting edema noted to the bilateral Vascular: 2+ radial pulses bilaterally, 2+ DP pulses bilaterally Skin: no rashes Psyc: acting appropriately   (all labs ordered are listed, but only abnormal results are displayed) Labs Reviewed  CBC WITH DIFFERENTIAL/PLATELET - Abnormal; Notable for the following components:      Result Value   WBC 3.3 (*)    RBC 2.55 (*)  Hemoglobin 9.8 (*)    HCT 30.7 (*)    MCV 120.4 (*)    MCH 38.4 (*)    RDW 17.9 (*)    Platelets 93 (*)    nRBC 1.2 (*)    All other components within normal limits  COMPREHENSIVE METABOLIC PANEL WITH GFR - Abnormal; Notable for the following components:   Sodium 130 (*)    CO2 12 (*)    Glucose, Bld 51 (*)    Creatinine, Ser 2.19 (*)    Calcium  7.6 (*)    Total Protein 5.4 (*)    Albumin  1.8 (*)    AST 178 (*)    ALT 67 (*)    Total Bilirubin 9.8 (*)    GFR, Estimated 25 (*)    Anion gap 20 (*)    All other components within normal limits  LIPASE, BLOOD   AMMONIA  URINALYSIS, ROUTINE W REFLEX MICROSCOPIC  PROTIME-INR  TYPE AND SCREEN    EKG: None  Radiology: No results found.   Procedures   Medications Ordered in the ED  octreotide  (SANDOSTATIN ) 2 mcg/mL load via infusion 50 mcg (50 mcg Intravenous Bolus from Bag 09/03/24 1517)    And  octreotide  (SANDOSTATIN ) 500 mcg in sodium chloride  0.9 % 250 mL (2 mcg/mL) infusion (50 mcg/hr Intravenous New Bag/Given 09/03/24 1516)  pantoprazole  (PROTONIX ) injection 80 mg (has no administration in time range)  lactated ringers  bolus 1,000 mL (has no administration in time range)  dextrose  50 % solution 50 mL (has no administration in time range)  norepinephrine  (LEVOPHED ) 4mg  in (0.016 mg/mL) premix infusion (has no administration in time range)  lactated ringers  bolus 1,000 mL (0 mLs Intravenous Stopped 09/03/24 1554)  midodrine  (PROAMATINE ) tablet 10 mg (10 mg Oral Given 09/03/24 1511)                                    Medical Decision Making 63 year old female with past medical history of end-stage liver disease presenting to the emergency department today with generalized weakness and lower extremity swelling.  I will further evaluate the patient here with basic labs as well as an ammonia and INR level.  The patient's initial blood pressure here is low on arrival.  The patient normally takes midodrine  and has not taken it this morning.  Even with this her blood pressures normally are in the low 90s systolic.  Given the complaint of the dark stools and known varices we will cover the patient with octreotide  and Protonix .  I will give patient IV fluids and obtain a type and screen and CBC to evaluate for anemia.  I will give her 1 L of IV fluids to start and reevaluate.  On reassessment after the initial liter of fluid the patient's blood pressure has improved to 80/68.  Her hemoglobin came back at 9.8 this does not appear to need transfusion at this time.  Creatinine is elevated here as  well with concerns for hepatorenal syndrome.  The patient is ordered a second liter of IV fluids.  Dr. Jackquline will follow-up on response to fluids and midodrine  to determine disposition.  If blood pressures continue to improve she may be able to be admitted to the floor.  If not ICU consultation may be needed.  I did send a secure chat to [facility] regarding the patient but she is not having any active hematemesis at this time.  CRITICAL  CARE Performed by: Prentice JONELLE Medicus   Total critical care time: 35 minutes  Critical care time was exclusive of separately billable procedures and treating other patients.  Critical care was necessary to treat or prevent imminent or life-threatening deterioration.  Critical care was time spent personally by me on the following activities: development of treatment plan with patient and/or surrogate as well as nursing, discussions with consultants, evaluation of patient's response to treatment, examination of patient, obtaining history from patient or surrogate, ordering and performing treatments and interventions, ordering and review of laboratory studies, ordering and review of radiographic studies, pulse oximetry and re-evaluation of patient's condition.   Amount and/or Complexity of Data Reviewed Labs: ordered.  Risk Prescription drug management.        Final diagnoses:  Hepatorenal syndrome (HCC)  AKI (acute kidney injury) (HCC)  Decompensated liver disease [facility])    ED Discharge Orders     None          Medicus Prentice JONELLE, MD 09/03/24 1609

## 2024-09-03 NOTE — Procedures (Signed)
 Central Venous Catheter Insertion Procedure Note  Rachel Lucas  [id number]  [birth year]  Date:09/03/24  Time:9:30 PM   Provider Performing:Rhylan Kagel [name] Ply   Procedure: Insertion of Non-tunneled Central Venous Catheter(36556) with US  guidance (23062)   Indication(s) Medication administration and Difficult access  Consent Risks of the procedure as well as the alternatives and risks of each were explained to the patient and/or caregiver.  Consent for the procedure was obtained and is signed in the bedside chart  Anesthesia Topical only with 1% lidocaine    Timeout Verified patient identification, verified procedure, site/side was marked, verified correct patient position, special equipment/implants available, medications/allergies/relevant history reviewed, required imaging and test results available.  Sterile Technique Maximal sterile technique including full sterile barrier drape, hand hygiene, sterile gown, sterile gloves, mask, hair covering, sterile ultrasound probe cover (if used).  Procedure Description Area of catheter insertion was cleaned with chlorhexidine  and draped in sterile fashion.  With real-time ultrasound guidance a central venous catheter was placed into the right femoral vein. Nonpulsatile blood flow and easy flushing noted in all ports.  The catheter was sutured in place and sterile dressing applied.  Complications/Tolerance None; patient tolerated the procedure well. Chest X-ray is ordered to verify placement for internal jugular or subclavian cannulation.   Chest x-ray is not ordered for femoral cannulation.  EBL Minimal  Specimen(s) None

## 2024-09-03 NOTE — ED Notes (Signed)
 Pt to ultrasound then to CT. This RN to transport patient upstairs once she's brought back to room.

## 2024-09-03 NOTE — Progress Notes (Signed)
 Pharmacy Antibiotic Note  Rachel Lucas is a 63 y.o. female for which pharmacy has been consulted for zosyn  dosing for pneumonia.  Estimated Creatinine Clearance: 22.6 mL/min (A) (by C-G formula based on SCr of 2.19 mg/dL (H)).   Plan: Linezolid  per MD Zosyn  3.375g IV q8h (4 hour infusion) Monitor WBC, fever, renal function, cultures De-escalate when able  Height: 5' 3 (160 cm) Weight: 59 kg (130 lb) IBW/kg (Calculated) : 52.4  No data recorded.  Recent Labs  Lab 08/29/24 1400 09/03/24 1320  WBC 13.6* 3.3*  CREATININE 0.69 2.19*    Estimated Creatinine Clearance: 22.6 mL/min (A) (by C-G formula based on SCr of 2.19 mg/dL (H)).    Allergies  Allergen Reactions   Levofloxacin Other (See Comments)   Sulfonamide Derivatives Palpitations   Microbiology results: Pending  Thank you for allowing pharmacy to be a part of this patient's care.  Dorn Buttner, PharmD, BCPS 09/03/2024 4:52 PM ED Clinical Pharmacist -  [phone]

## 2024-09-03 NOTE — Plan of Care (Incomplete)
 This patient remains on [id number] as of time of writing. The patient is AA+Ox4. No supplemental O2 requirement. SR on wall monitor. PIV access only upon arrival to unit. CVC and arterial line placed by Dr. Dub overnight in the patient's right femoral region. Multiple vasopressors infusing. FFP and albumin  received overnight as well. Overnight, the patient is not cooperative with not removing medical equipment, I.e.: Bair hugger, oxygen, SCDs, etc; and required almost constant presence at the bedside to maintain safety.   09/03/24:  1910 hours: Call placed to E-Link to discuss venous access as patient was receiving 22 mcg/min of Levophed  via PIV. Provider to assess at bedside.  2020 hours: Verbal order received from Dr. Dub for 2 units of FFP and per emergent consent. Orders entered by this RN at this time as verbal order.  2106 hours: Levophed  raised from 22 mcg to mcg 40 per Dr. Dub (MD at bedside).   2203 hours: Call received from Lab at this time; LA is critical high at 6.5. E-Link team notified of the value; see new orders.  2311 hours: Secure chat sent to E-Link RN, Gretchin, to discuss patient's elevated serum glucose. No new orders at this time. Will recheck glucose as scheduled.  2300 hours: Patient placed on a Bair Hugger at this time; unable to obtain peripheral temperature.   2338 hours: Transfusion of 1st unit of FFP initiated at this time.  2353 hours: 15 minute transfusion VS obtained.  09/04/24  0035 hours: 1st unit FFP completed at this time.  0110 hours: 2nd unit of FFP initiated at this time.   0120 hours: LA is again noted to be critically high at 6.8; E-Link team notified at this time.  0125 hours: 15-minute transfusion VS obtained at this time.   0220 hours: 2nd unit FFP completed at this time.  0515 hours: Call placed to E-Link for worsening mottling, worsening delirium and agitation. At this time, the we are unable to obtain SPO2 or temp  as patient is no lnger able to cooperate with basic care. See new orders and note by Dr. Haze @ 0532 hours.  Problem: Education: Goal: Knowledge of General Education information will improve Description: Including pain rating scale, medication(s)/side effects and non-pharmacologic comfort measures Outcome: Progressing   Problem: Health Behavior/Discharge Planning: Goal: Ability to manage health-related needs will improve Outcome: Progressing   Problem: Clinical Measurements: Goal: Ability to maintain clinical measurements within normal limits will improve Outcome: Progressing Goal: Will remain free from infection Outcome: Progressing Goal: Diagnostic test results will improve Outcome: Progressing Goal: Respiratory complications will improve Outcome: Progressing Goal: Cardiovascular complication will be avoided Outcome: Progressing   Problem: Activity: Goal: Risk for activity intolerance will decrease Outcome: Progressing   Problem: Nutrition: Goal: Adequate nutrition will be maintained Outcome: Progressing   Problem: Coping: Goal: Level of anxiety will decrease Outcome: Progressing   Problem: Elimination: Goal: Will not experience complications related to bowel motility Outcome: Progressing Goal: Will not experience complications related to urinary retention Outcome: Progressing   Problem: Pain Managment: Goal: General experience of comfort will improve and/or be controlled Outcome: Progressing   Problem: Safety: Goal: Ability to remain free from injury will improve Outcome: Progressing   Problem: Skin Integrity: Goal: Risk for impaired skin integrity will decrease Outcome: Progressing   Problem: Fluid Volume: Goal: Hemodynamic stability will improve Outcome: Progressing   Problem: Clinical Measurements: Goal: Diagnostic test results will improve Outcome: Progressing Goal: Signs and symptoms of infection will decrease Outcome: Progressing   Problem:  Respiratory: Goal: Ability to maintain adequate ventilation will improve Outcome: Progressing

## 2024-09-03 NOTE — ED Provider Notes (Signed)
  Physical Exam  BP (!) 80/69 (BP Location: Right Arm)   Ht 5' 3 (1.6 m)   Wt 59 kg   BMI 23.03 kg/m   Physical Exam Vitals and nursing note reviewed.  Constitutional:      General: She is not in acute distress.    Appearance: She is well-developed.  HENT:     Head: Normocephalic and atraumatic.  Eyes:     Conjunctiva/sclera: Conjunctivae normal.  Cardiovascular:     Rate and Rhythm: Normal rate and regular rhythm.     Heart sounds: No murmur heard. Pulmonary:     Effort: Pulmonary effort is normal. No respiratory distress.     Breath sounds: Normal breath sounds.  Abdominal:     Palpations: Abdomen is soft.     Tenderness: There is no abdominal tenderness.  Musculoskeletal:        General: No swelling.     Cervical back: Neck supple.  Skin:    General: Skin is warm and dry.     Capillary Refill: Capillary refill takes less than 2 seconds.     Coloration: Skin is jaundiced.  Neurological:     Mental Status: She is alert.  Psychiatric:        Mood and Affect: Mood normal.     Procedures  Procedures  ED Course / MDM    Medical Decision Making Amount and/or Complexity of Data Reviewed Labs: ordered.  Risk Prescription drug management. Decision regarding hospitalization.   Presents because of hypotension.  Concern for hepatorenal syndrome.  Patient is edematous in her lower extremities.  History gastric varices.  Intermittent dark stools over the past couple weeks.  Ordered 2 L of fluid this point in time.  Very little p.o. intake over the past couple days as well.  To go see the patient.  Persistent hypotension despite 1 L of fluid resuscitation.  Nurse was walking in with another liter of fluid at that time.  I went ahead and ordered norepinephrine  to have a bedside because concern for ongoing hypotension.  Patient was mentating well.  Patient is soft and benign abdomen.  No concerns for an Kuneff SBP.  Did review patient's chart.  Recently did have Pseudomonas  and was treated with ceftriaxone .  Restarted ceftriaxone  and obtain blood cultures.  Patient's glucose was found to be in the 60s.  D50 received.   Given patient's hypotension, I did go ahead and consult ICU.  They will admit the patient for further care     Simon Lavonia SAILOR, MD 09/03/24 [phone]

## 2024-09-03 NOTE — Progress Notes (Signed)
 eLink Physician-Brief Progress Note Patient Name: Rachel Lucas DOB: [birth year] MRN: [id number]   Date of Service  09/03/2024  HPI/Events of Note  63 year old woman with alcoholic cirrhosis and recent admission for Klebsiella pneumonia bacteremia in the setting of otitis media returns to the hospital with septic shock thought to be secondary to cellulitis.  Results show hypotension but otherwise normal vitals.  The patient is on norepinephrine  infusion and status post broad-spectrum antibiotics.  Results consistent with upper glycemia, elevated creatinine, leukopenia, macrocytic anemia.  Imaging consistent with persistent otitis media and lower extremity swelling/inflammation  eICU Interventions  Maintain broad-spectrum antibiotics-linezolid  and Zosyn   Continue albumin  resuscitation per HRS protocol, add vasopressin  and stress dose steroids in the setting of refractory shock.  Referred to ground team for potential CVC  Given vasopressor requirements, maintain n.p.o. except meds  DVT prophylaxis chemoprophylaxis held until evaluation of INR, mechanical prophylaxis held in the setting of active cellulitis in lower extremities GI prophylaxis with therapeutic Protonix    0356 - Add Zofran  0525 -patient has increasing mottling of the lower extremities and abdomen, increased confusion, pressor requirements are downtrending.  ABG obtained with pH 7.09/60/38 with 56% saturation.  2 ampoules of bicarb plus bicarb infusion.  Magnesium  replacement.  Initiate restraints for patient safety, apply oxygen.  Ideally would utilize BiPAP, but the patient is delirious and pulling off even the oxygen cannula and pulling at lines.  If the patient does not have improvement with oxygenation and ventilation with the current support on repeat gas in 1 hour, will refer to ground team for likely intubation after shift change   Intervention Category Evaluation Type: New Patient Evaluation  [name] 09/03/2024, 7:36 PM

## 2024-09-03 NOTE — Telephone Encounter (Signed)
 Patient spouse calling back in regards to previous note., please advise.   Thank you

## 2024-09-03 NOTE — Procedures (Signed)
 Central Venous Catheter Insertion Procedure Note  Rachel Lucas  [id number]  1961-08-08  Date:09/03/24  Time:9:28 PM   Provider Performing:Raksha Wolfgang [name] Ply   Procedure: Insertion of Non-tunneled Central Venous [phone]) with US  guidance (23062)   Indication(s) Medication administration and Difficult access  Consent Risks of the procedure as well as the alternatives and risks of each were explained to the patient and/or caregiver.  Consent for the procedure was obtained and is signed in the bedside chart  Anesthesia Topical only with 1% lidocaine    Timeout Verified patient identification, verified procedure, site/side was marked, verified correct patient position, special equipment/implants available, medications/allergies/relevant history reviewed, required imaging and test results available.  Sterile Technique Maximal sterile technique including full sterile barrier drape, hand hygiene, sterile gown, sterile gloves, mask, hair covering, sterile ultrasound probe cover (if used).  Procedure Description Area of catheter insertion was cleaned with chlorhexidine  and draped in sterile fashion.  With real-time ultrasound guidance a central venous catheter failed to be placed into the left internal jugular vein, due to collapsibility of the vein.   Complications/Tolerance None; patient tolerated the procedure well. Chest X-ray is ordered to verify placement for internal jugular or subclavian cannulation.   Chest x-ray is not ordered for femoral cannulation.  EBL Hematoma: manual pressure was applied for 5 min  Specimen(s) None

## 2024-09-03 NOTE — Telephone Encounter (Signed)
 Called patient and spoke with her husband. Husband states she started to get worse last night after dinner.  She has had increased swelling and now blisters/redness in left more than right leg, having weakness, some confusion.   Then this morning she had large-volume tarry black stool. While on the phone with him he had already called the ambulance and they arrived to take her to the hospital.  Very concerned for possible variceal bleed or hemorrhage. Will alert our inpatient team.  Will likely be going to [facility]. Patient was referred to [facility] transplant center but has not made appointment yet.

## 2024-09-03 NOTE — Telephone Encounter (Signed)
 Noted

## 2024-09-03 NOTE — Procedures (Signed)
 Arterial Catheter Insertion Procedure Note  HOLLIS OH  [id number]  10-02-1961  Date:09/03/24  Time:9:31 PM    Provider Performing: Mancel [name] Ply    Procedure: Insertion of Arterial Line (63379) with US  guidance (23062)   Indication(s) Blood pressure monitoring and/or need for frequent ABGs  Consent Risks of the procedure as well as the alternatives and risks of each were explained to the patient and/or caregiver.  Consent for the procedure was obtained and is signed in the bedside chart  Anesthesia None   Time Out Verified patient identification, verified procedure, site/side was marked, verified correct patient position, special equipment/implants available, medications/allergies/relevant history reviewed, required imaging and test results available.   Sterile Technique Maximal sterile technique including full sterile barrier drape, hand hygiene, sterile gown, sterile gloves, mask, hair covering, sterile ultrasound probe cover (if used).   Procedure Description Area of catheter insertion was cleaned with chlorhexidine  and draped in sterile fashion. With real-time ultrasound guidance an arterial catheter was placed into the right femoral artery.  Appropriate arterial tracings confirmed on monitor.     Complications/Tolerance None; patient tolerated the procedure well.   EBL Minimal   Specimen(s) None

## 2024-09-04 ENCOUNTER — Other Ambulatory Visit: Payer: Self-pay

## 2024-09-04 ENCOUNTER — Inpatient Hospital Stay (HOSPITAL_COMMUNITY)

## 2024-09-04 ENCOUNTER — Encounter (HOSPITAL_COMMUNITY): Payer: Self-pay | Admitting: Internal Medicine

## 2024-09-04 ENCOUNTER — Other Ambulatory Visit (HOSPITAL_COMMUNITY)

## 2024-09-04 DIAGNOSIS — R609 Edema, unspecified: Secondary | ICD-10-CM

## 2024-09-04 DIAGNOSIS — A419 Sepsis, unspecified organism: Secondary | ICD-10-CM | POA: Diagnosis not present

## 2024-09-04 DIAGNOSIS — D649 Anemia, unspecified: Secondary | ICD-10-CM

## 2024-09-04 DIAGNOSIS — I469 Cardiac arrest, cause unspecified: Secondary | ICD-10-CM | POA: Diagnosis not present

## 2024-09-04 DIAGNOSIS — R4182 Altered mental status, unspecified: Secondary | ICD-10-CM

## 2024-09-04 DIAGNOSIS — R34 Anuria and oliguria: Secondary | ICD-10-CM | POA: Diagnosis not present

## 2024-09-04 DIAGNOSIS — E8721 Acute metabolic acidosis: Secondary | ICD-10-CM

## 2024-09-04 DIAGNOSIS — K7040 Alcoholic hepatic failure without coma: Secondary | ICD-10-CM | POA: Diagnosis not present

## 2024-09-04 DIAGNOSIS — D65 Disseminated intravascular coagulation [defibrination syndrome]: Secondary | ICD-10-CM

## 2024-09-04 DIAGNOSIS — R6521 Severe sepsis with septic shock: Secondary | ICD-10-CM | POA: Diagnosis not present

## 2024-09-04 DIAGNOSIS — E162 Hypoglycemia, unspecified: Secondary | ICD-10-CM

## 2024-09-04 DIAGNOSIS — R569 Unspecified convulsions: Secondary | ICD-10-CM | POA: Diagnosis not present

## 2024-09-04 LAB — URINALYSIS, W/ REFLEX TO CULTURE (INFECTION SUSPECTED)
Glucose, UA: NEGATIVE mg/dL
Hgb urine dipstick: NEGATIVE
Ketones, ur: NEGATIVE mg/dL
Leukocytes,Ua: NEGATIVE
Nitrite: NEGATIVE
Protein, ur: 100 mg/dL — AB
Specific Gravity, Urine: 1.014 (ref 1.005–1.030)
pH: 5 (ref 5.0–8.0)

## 2024-09-04 LAB — RESPIRATORY PANEL BY PCR

## 2024-09-04 LAB — PROTIME-INR
INR: 3 — ABNORMAL HIGH (ref 0.8–1.2)
INR: 5.6 (ref 0.8–1.2)
INR: 5.4 (ref 0.8–1.2)
Prothrombin Time: 32.2 s — ABNORMAL HIGH (ref 11.4–15.2)
Prothrombin Time: 53.2 s — ABNORMAL HIGH (ref 11.4–15.2)
Prothrombin Time: 51.3 s — ABNORMAL HIGH (ref 11.4–15.2)

## 2024-09-04 LAB — PHOSPHORUS: Phosphorus: 7.4 mg/dL — ABNORMAL HIGH (ref 2.5–4.6)

## 2024-09-04 LAB — BASIC METABOLIC PANEL WITH GFR
Anion gap: 13 (ref 5–15)
Anion gap: 25 — ABNORMAL HIGH (ref 5–15)
BUN: 22 mg/dL — ABNORMAL HIGH (ref 6–20)
BUN: 23 mg/dL — ABNORMAL HIGH (ref 6–20)
CO2: 16 mmol/L — ABNORMAL LOW (ref 22–32)
CO2: 15 mmol/L — ABNORMAL LOW (ref 22–32)
Calcium: 7.3 mg/dL — ABNORMAL LOW (ref 8.9–10.3)
Calcium: 7.8 mg/dL — ABNORMAL LOW (ref 8.9–10.3)
Chloride: 95 mmol/L — ABNORMAL LOW (ref 98–111)
Chloride: 87 mmol/L — ABNORMAL LOW (ref 98–111)
Creatinine, Ser: 2.43 mg/dL — ABNORMAL HIGH (ref 0.44–1.00)
Creatinine, Ser: 2.61 mg/dL — ABNORMAL HIGH (ref 0.44–1.00)
GFR, Estimated: 22 mL/min — ABNORMAL LOW (ref >60)
GFR, Estimated: 20 mL/min — ABNORMAL LOW (ref >60)
Glucose, Bld: 134 mg/dL — ABNORMAL HIGH (ref 70–99)
Glucose, Bld: 137 mg/dL — ABNORMAL HIGH (ref 70–99)
Potassium: 4.1 mmol/L (ref 3.5–5.1)
Potassium: 4.2 mmol/L (ref 3.5–5.1)
Sodium: 124 mmol/L — ABNORMAL LOW (ref 135–145)
Sodium: 127 mmol/L — ABNORMAL LOW (ref 135–145)

## 2024-09-04 LAB — HEMOGLOBIN AND HEMATOCRIT, BLOOD
HCT: 24.2 % — ABNORMAL LOW (ref 36.0–46.0)
Hemoglobin: 8.1 g/dL — ABNORMAL LOW (ref 12.0–15.0)

## 2024-09-04 LAB — POCT I-STAT 7, (LYTES, BLD GAS, ICA,H+H)
Acid-base deficit: 11 mmol/L — ABNORMAL HIGH (ref 0.0–2.0)
Acid-base deficit: 5 mmol/L — ABNORMAL HIGH (ref 0.0–2.0)
Acid-base deficit: 8 mmol/L — ABNORMAL HIGH (ref 0.0–2.0)
Acid-base deficit: 10 mmol/L — ABNORMAL HIGH (ref 0.0–2.0)
Bicarbonate: 18.6 mmol/L — ABNORMAL LOW (ref 20.0–28.0)
Bicarbonate: 21.3 mmol/L (ref 20.0–28.0)
Bicarbonate: 19.4 mmol/L — ABNORMAL LOW (ref 20.0–28.0)
Bicarbonate: 16.7 mmol/L — ABNORMAL LOW (ref 20.0–28.0)
Calcium, Ion: 1.07 mmol/L — ABNORMAL LOW (ref 1.15–1.40)
Calcium, Ion: 0.98 mmol/L — ABNORMAL LOW (ref 1.15–1.40)
Calcium, Ion: 1.22 mmol/L (ref 1.15–1.40)
Calcium, Ion: 0.97 mmol/L — ABNORMAL LOW (ref 1.15–1.40)
HCT: 27 % — ABNORMAL LOW (ref 36.0–46.0)
HCT: 24 % — ABNORMAL LOW (ref 36.0–46.0)
HCT: 27 % — ABNORMAL LOW (ref 36.0–46.0)
HCT: 22 % — ABNORMAL LOW (ref 36.0–46.0)
Hemoglobin: 9.2 g/dL — ABNORMAL LOW (ref 12.0–15.0)
Hemoglobin: 8.2 g/dL — ABNORMAL LOW (ref 12.0–15.0)
Hemoglobin: 9.2 g/dL — ABNORMAL LOW (ref 12.0–15.0)
Hemoglobin: 7.5 g/dL — ABNORMAL LOW (ref 12.0–15.0)
O2 Saturation: 56 %
O2 Saturation: 96 %
O2 Saturation: 100 %
O2 Saturation: 89 %
Patient temperature: 96.3
Patient temperature: 35.6
Patient temperature: 96.5
Potassium: 4.5 mmol/L (ref 3.5–5.1)
Potassium: 4.2 mmol/L (ref 3.5–5.1)
Potassium: 3.9 mmol/L (ref 3.5–5.1)
Potassium: 4 mmol/L (ref 3.5–5.1)
Sodium: 129 mmol/L — ABNORMAL LOW (ref 135–145)
Sodium: 129 mmol/L — ABNORMAL LOW (ref 135–145)
Sodium: 128 mmol/L — ABNORMAL LOW (ref 135–145)
Sodium: 124 mmol/L — ABNORMAL LOW (ref 135–145)
TCO2: 21 mmol/L — ABNORMAL LOW (ref 22–32)
TCO2: 23 mmol/L (ref 22–32)
TCO2: 21 mmol/L — ABNORMAL LOW (ref 22–32)
TCO2: 18 mmol/L — ABNORMAL LOW (ref 22–32)
pCO2 arterial: 60 mmHg — ABNORMAL HIGH (ref 32–48)
pCO2 arterial: 44.1 mmHg (ref 32–48)
pCO2 arterial: 44.2 mmHg (ref 32–48)
pCO2 arterial: 38 mmHg (ref 32–48)
pH, Arterial: 7.092 — CL (ref 7.35–7.45)
pH, Arterial: 7.285 — ABNORMAL LOW (ref 7.35–7.45)
pH, Arterial: 7.243 — ABNORMAL LOW (ref 7.35–7.45)
pH, Arterial: 7.251 — ABNORMAL LOW (ref 7.35–7.45)
pO2, Arterial: 38 mmHg — CL (ref 83–108)
pO2, Arterial: 89 mmHg (ref 83–108)
pO2, Arterial: 283 mmHg — ABNORMAL HIGH (ref 83–108)
pO2, Arterial: 65 mmHg — ABNORMAL LOW (ref 83–108)

## 2024-09-04 LAB — HEPATIC FUNCTION PANEL
ALT: 55 U/L — ABNORMAL HIGH (ref 0–44)
AST: 126 U/L — ABNORMAL HIGH (ref 15–41)
Albumin: 3 g/dL — ABNORMAL LOW (ref 3.5–5.0)
Alkaline Phosphatase: 57 U/L (ref 38–126)
Bilirubin, Direct: 4.9 mg/dL — ABNORMAL HIGH (ref 0.0–0.2)
Indirect Bilirubin: 5.7 mg/dL — ABNORMAL HIGH (ref 0.3–0.9)
Total Bilirubin: 10.6 mg/dL — ABNORMAL HIGH (ref 0.0–1.2)
Total Protein: 6.1 g/dL — ABNORMAL LOW (ref 6.5–8.1)

## 2024-09-04 LAB — GLUCOSE, CAPILLARY
Glucose-Capillary: 114 mg/dL — ABNORMAL HIGH (ref 70–99)
Glucose-Capillary: 129 mg/dL — ABNORMAL HIGH (ref 70–99)
Glucose-Capillary: 195 mg/dL — ABNORMAL HIGH (ref 70–99)
Glucose-Capillary: 47 mg/dL — ABNORMAL LOW (ref 70–99)
Glucose-Capillary: 63 mg/dL — ABNORMAL LOW (ref 70–99)
Glucose-Capillary: 94 mg/dL (ref 70–99)

## 2024-09-04 LAB — CBC
HCT: 24.4 % — ABNORMAL LOW (ref 36.0–46.0)
HCT: 20.5 % — ABNORMAL LOW (ref 36.0–46.0)
Hemoglobin: 8.3 g/dL — ABNORMAL LOW (ref 12.0–15.0)
Hemoglobin: 6.8 g/dL — CL (ref 12.0–15.0)
MCH: 39.3 pg — ABNORMAL HIGH (ref 26.0–34.0)
MCH: 38.4 pg — ABNORMAL HIGH (ref 26.0–34.0)
MCHC: 34 g/dL (ref 30.0–36.0)
MCHC: 33.2 g/dL (ref 30.0–36.0)
MCV: 115.6 fL — ABNORMAL HIGH (ref 80.0–100.0)
MCV: 115.8 fL — ABNORMAL HIGH (ref 80.0–100.0)
Platelets: 73 K/uL — ABNORMAL LOW (ref 150–400)
Platelets: 46 K/uL — ABNORMAL LOW (ref 150–400)
RBC: 2.11 MIL/uL — ABNORMAL LOW (ref 3.87–5.11)
RBC: 1.77 MIL/uL — ABNORMAL LOW (ref 3.87–5.11)
RDW: 17.3 % — ABNORMAL HIGH (ref 11.5–15.5)
RDW: 17.3 % — ABNORMAL HIGH (ref 11.5–15.5)
WBC: 5.8 K/uL (ref 4.0–10.5)
WBC: 6.4 K/uL (ref 4.0–10.5)
nRBC: 0 % (ref 0.0–0.2)
nRBC: 0.6 % — ABNORMAL HIGH (ref 0.0–0.2)

## 2024-09-04 LAB — DIC (DISSEMINATED INTRAVASCULAR COAGULATION)PANEL
D-Dimer, Quant: 8.62 ug{FEU}/mL — ABNORMAL HIGH (ref 0.00–0.50)
Fibrinogen: 171 mg/dL — ABNORMAL LOW (ref 210–475)
INR: 4.4 (ref 0.8–1.2)
Platelets: 52 K/uL — ABNORMAL LOW (ref 150–400)
Prothrombin Time: 43.8 s — ABNORMAL HIGH (ref 11.4–15.2)
aPTT: 63 s — ABNORMAL HIGH (ref 24–36)

## 2024-09-04 LAB — ECHOCARDIOGRAM COMPLETE
Height: 63 in
Single Plane A4C EF: 40.6 %
Weight: 2246.93 [oz_av]

## 2024-09-04 LAB — COMPREHENSIVE METABOLIC PANEL WITH GFR
ALT: 39 U/L (ref 0–44)
AST: 102 U/L — ABNORMAL HIGH (ref 15–41)
Albumin: 3.1 g/dL — ABNORMAL LOW (ref 3.5–5.0)
Alkaline Phosphatase: 34 U/L — ABNORMAL LOW (ref 38–126)
Anion gap: 20 — ABNORMAL HIGH (ref 5–15)
BUN: 21 mg/dL — ABNORMAL HIGH (ref 6–20)
CO2: 19 mmol/L — ABNORMAL LOW (ref 22–32)
Calcium: 10.5 mg/dL — ABNORMAL HIGH (ref 8.9–10.3)
Chloride: 91 mmol/L — ABNORMAL LOW (ref 98–111)
Creatinine, Ser: 2.6 mg/dL — ABNORMAL HIGH (ref 0.44–1.00)
GFR, Estimated: 20 mL/min — ABNORMAL LOW (ref >60)
Glucose, Bld: 92 mg/dL (ref 70–99)
Potassium: 4.2 mmol/L (ref 3.5–5.1)
Sodium: 130 mmol/L — ABNORMAL LOW (ref 135–145)
Total Bilirubin: 8.5 mg/dL — ABNORMAL HIGH (ref 0.0–1.2)
Total Protein: 5.4 g/dL — ABNORMAL LOW (ref 6.5–8.1)

## 2024-09-04 LAB — POCT ACTIVATED CLOTTING TIME: Activated Clotting Time: 0 s

## 2024-09-04 LAB — TROPONIN I (HIGH SENSITIVITY)
Troponin I (High Sensitivity): 194 ng/L (ref <18)
Troponin I (High Sensitivity): 1424 ng/L (ref <18)

## 2024-09-04 LAB — CK: Total CK: 318 U/L — ABNORMAL HIGH (ref 38–234)

## 2024-09-04 LAB — LACTIC ACID, PLASMA
Lactic Acid, Venous: 6.8 mmol/L (ref 0.5–1.9)
Lactic Acid, Venous: 5.8 mmol/L (ref 0.5–1.9)
Lactic Acid, Venous: 6.1 mmol/L (ref 0.5–1.9)
Lactic Acid, Venous: >9 mmol/L (ref 0.5–1.9)
Lactic Acid, Venous: >9 mmol/L (ref 0.5–1.9)

## 2024-09-04 LAB — MAGNESIUM
Magnesium: 1.3 mg/dL — ABNORMAL LOW (ref 1.7–2.4)
Magnesium: 2.9 mg/dL — ABNORMAL HIGH (ref 1.7–2.4)

## 2024-09-04 LAB — PREPARE RBC (CROSSMATCH)

## 2024-09-04 LAB — APTT: aPTT: 65 s — ABNORMAL HIGH (ref 24–36)

## 2024-09-04 MED ORDER — POLYETHYLENE GLYCOL 3350 17 G PO PACK: 17.0000 g | PACK | Freq: Every day | ORAL | Status: DC

## 2024-09-04 MED ORDER — EPINEPHRINE HCL 5 MG/250ML IV SOLN IN NS
INTRAVENOUS | Status: AC
  Administered 2024-09-04: 1 ug/min via INTRAVENOUS
  Filled 2024-09-04: qty 250

## 2024-09-04 MED ORDER — MIDAZOLAM HCL 2 MG/2ML IJ SOLN
1.0000 mg | INTRAMUSCULAR | Status: DC | PRN
  Administered 2024-09-09 – 2024-09-10 (×3): 2 mg via INTRAVENOUS
  Filled 2024-09-04 (×3): qty 2

## 2024-09-04 MED ORDER — FENTANYL BOLUS VIA INFUSION
25.0000 ug | INTRAVENOUS | Status: DC | PRN
  Administered 2024-09-04 – 2024-09-05 (×2): 50 ug via INTRAVENOUS
  Administered 2024-09-06 (×2): 100 ug via INTRAVENOUS

## 2024-09-04 MED ORDER — LACTATED RINGERS IV BOLUS
1000.0000 mL | Freq: Once | INTRAVENOUS | Status: AC
  Administered 2024-09-04: 1000 mL via INTRAVENOUS

## 2024-09-04 MED ORDER — EPINEPHRINE 1 MG/10ML IV SOSY
PREFILLED_SYRINGE | INTRAVENOUS | Status: AC
  Filled 2024-09-04: qty 30

## 2024-09-04 MED ORDER — MAGNESIUM SULFATE 50 % IJ SOLN
6.0000 g | Freq: Once | INTRAMUSCULAR | Status: AC
  Administered 2024-09-04: 6 g via INTRAVENOUS
  Filled 2024-09-04: qty 10

## 2024-09-04 MED ORDER — DEXTROSE 50 % IV SOLN
INTRAVENOUS | Status: AC
  Administered 2024-09-04: 50 mL via INTRAVENOUS
  Filled 2024-09-04: qty 50

## 2024-09-04 MED ORDER — DEXTROSE 10 % IV SOLN: INTRAVENOUS | Status: DC

## 2024-09-04 MED ORDER — LACTULOSE ENEMA
300.0000 mL | Freq: Two times a day (BID) | ORAL | Status: DC
  Filled 2024-09-04 (×2): qty 300

## 2024-09-04 MED ORDER — DOCUSATE SODIUM 50 MG/5ML PO LIQD
100.0000 mg | Freq: Two times a day (BID) | ORAL | Status: DC
Start: 2024-09-04 — End: 2024-09-08
  Administered 2024-09-04 – 2024-09-05 (×3): 100 mg
  Filled 2024-09-04 (×4): qty 10

## 2024-09-04 MED ORDER — SODIUM BICARBONATE 8.4 % IV SOLN
INTRAVENOUS | Status: AC
  Filled 2024-09-04: qty 100

## 2024-09-04 MED ORDER — INFLUENZA VIRUS VACC SPLIT PF (FLUZONE) 0.5 ML IM SUSY
0.5000 mL | PREFILLED_SYRINGE | INTRAMUSCULAR | Status: DC
Start: 2024-09-07 — End: 2024-09-07
  Filled 2024-09-04: qty 0.5

## 2024-09-04 MED ORDER — LACTULOSE 10 GM/15ML PO SOLN: 20.0000 g | Freq: Three times a day (TID) | ORAL | Status: DC

## 2024-09-04 MED ORDER — SODIUM CHLORIDE 0.9% IV SOLUTION: Freq: Once | INTRAVENOUS | Status: DC

## 2024-09-04 MED ORDER — PNEUMOCOCCAL 20-VAL CONJ VACC 0.5 ML IM SUSY: 0.5000 mL | PREFILLED_SYRINGE | INTRAMUSCULAR | Status: DC | PRN

## 2024-09-04 MED ORDER — ONDANSETRON HCL 4 MG/2ML IJ SOLN
4.0000 mg | Freq: Four times a day (QID) | INTRAMUSCULAR | Status: DC | PRN
  Administered 2024-09-04: 4 mg via INTRAVENOUS
  Filled 2024-09-04: qty 2

## 2024-09-04 MED ORDER — RIFAXIMIN 550 MG PO TABS
550.0000 mg | ORAL_TABLET | Freq: Two times a day (BID) | ORAL | Status: DC
  Administered 2024-09-04: 550 mg via ORAL
  Filled 2024-09-04: qty 1

## 2024-09-04 MED ORDER — DEXTROSE 50 % IV SOLN: 50.0000 mL | INTRAVENOUS | Status: AC

## 2024-09-04 MED ORDER — FENTANYL 2500MCG IN NS 250ML (10MCG/ML) PREMIX INFUSION
INTRAVENOUS | Status: AC
  Filled 2024-09-04: qty 250

## 2024-09-04 MED ORDER — BUSPIRONE HCL 15 MG PO TABS: 30.0000 mg | ORAL_TABLET | Freq: Three times a day (TID) | ORAL | Status: DC | PRN

## 2024-09-04 MED ORDER — STERILE WATER FOR INJECTION IV SOLN
INTRAMUSCULAR | Status: DC
  Filled 2024-09-04 (×2): qty 1000

## 2024-09-04 MED ORDER — SODIUM BICARBONATE 8.4 % IV SOLN
INTRAVENOUS | Status: DC
  Filled 2024-09-04 (×2): qty 150
  Filled 2024-09-04: qty 1000
  Filled 2024-09-04: qty 150

## 2024-09-04 MED ORDER — DEXTROSE 50 % IV SOLN: 50.0000 mL | Freq: Once | INTRAVENOUS | Status: AC

## 2024-09-04 MED ORDER — CIPROFLOXACIN-DEXAMETHASONE 0.3-0.1 % OT SUSP
4.0000 [drp] | Freq: Two times a day (BID) | OTIC | Status: DC
  Administered 2024-09-04 – 2024-09-07 (×6): 4 [drp] via OTIC
  Filled 2024-09-04: qty 7.5

## 2024-09-04 MED ORDER — ALBUMIN HUMAN 25 % IV SOLN
25.0000 g | Freq: Four times a day (QID) | INTRAVENOUS | Status: DC
  Administered 2024-09-04 – 2024-09-05 (×3): 25 g via INTRAVENOUS
  Filled 2024-09-04 (×3): qty 100

## 2024-09-04 MED ORDER — SODIUM CHLORIDE 0.9 % IV SOLN
4.0000 g | Freq: Once | INTRAVENOUS | Status: AC
  Administered 2024-09-04: 4 g via INTRAVENOUS
  Filled 2024-09-04: qty 40

## 2024-09-04 MED ORDER — CALCIUM GLUCONATE-NACL 1-0.675 GM/50ML-% IV SOLN
1.0000 g | Freq: Once | INTRAVENOUS | Status: AC
  Administered 2024-09-04: 1000 mg via INTRAVENOUS
  Filled 2024-09-04: qty 50

## 2024-09-04 MED ORDER — FENTANYL 2500MCG IN NS 250ML (10MCG/ML) PREMIX INFUSION
0.0000 ug/h | INTRAVENOUS | Status: DC
  Administered 2024-09-04: 25 ug/h via INTRAVENOUS
  Administered 2024-09-05: 150 ug/h via INTRAVENOUS
  Administered 2024-09-05 – 2024-09-06 (×3): 300 ug/h via INTRAVENOUS
  Administered 2024-09-06: 325 ug/h via INTRAVENOUS
  Administered 2024-09-07: 175 ug/h via INTRAVENOUS
  Filled 2024-09-04 (×6): qty 250

## 2024-09-04 MED ORDER — SODIUM BICARBONATE 8.4 % IV SOLN
100.0000 meq | Freq: Once | INTRAVENOUS | Status: AC
  Administered 2024-09-04: 100 meq via INTRAVENOUS

## 2024-09-04 MED ORDER — LACTULOSE 10 GM/15ML PO SOLN
30.0000 g | Freq: Three times a day (TID) | ORAL | Status: DC
  Administered 2024-09-04 – 2024-09-12 (×21): 30 g
  Filled 2024-09-04 (×22): qty 45

## 2024-09-04 MED ORDER — EPINEPHRINE HCL 5 MG/250ML IV SOLN IN NS
1.0000 ug/min | INTRAVENOUS | Status: DC
  Administered 2024-09-05: 5 ug/min via INTRAVENOUS
  Administered 2024-09-06: 2 ug/min via INTRAVENOUS
  Filled 2024-09-04 (×3): qty 250

## 2024-09-04 MED ORDER — FENTANYL CITRATE PF 50 MCG/ML IJ SOSY: 25.0000 ug | PREFILLED_SYRINGE | Freq: Once | INTRAMUSCULAR | Status: DC

## 2024-09-04 NOTE — Progress Notes (Addendum)
 LLE venous duplex has been completed.   Results can be found under chart review under CV PROC. 09/04/2024 2:54 PM Rider Ermis RVT, RDMS

## 2024-09-04 NOTE — Procedures (Signed)
 Patient Name: Rachel Lucas  MRN: [id number]  Epilepsy Attending: Arlin MALVA Krebs  Referring Physician/Provider: Toma Matas, MD  Date: 09/04/2024 Duration: 22.19 mins  Patient history: 63yo F with ams getting eeg to evaluate for seizure  Level of alertness: Awake  AEDs during EEG study: None  Technical aspects: This EEG study was done with scalp electrodes positioned according to the 10-20 International system of electrode placement. Electrical activity was reviewed with band pass filter of 1-70Hz , sensitivity of 7 uV/mm, display speed of 5mm/sec with a 60Hz  notched filter applied as appropriate. EEG data were recorded continuously and digitally stored.  Video monitoring was available and reviewed as appropriate.  Description: EEG showed continuous generalized 3 to 6 Hz theta-delta slowing. Hyperventilation and photic stimulation were not performed.     ABNORMALITY - Continuous slow, generalized  IMPRESSION: This study is suggestive of moderate diffuse encephalopathy. No seizures or epileptiform discharges were seen throughout the recording.  Saja Bartolini O Madilynn Montante

## 2024-09-04 NOTE — Progress Notes (Signed)
 VAST RN responded to Code [employer]. Pt with appropriate access at this time. No further access needed.

## 2024-09-04 NOTE — Procedures (Signed)
 PATIENT NAME: Rachel Lucas MEDICAL RECORD NUMBER: [id number] Birthday: [birth year]  Age: 63 y.o. Admit Date: 09/03/2024  Provider: DR Geronimo and CCM team - resident and APP Corean Burner  Indication: sudden PEA  Technical Description:  CPR performance duration: approx 6-7 min Was defibrillation or cardioversion used ? no Was external pacer placed ? no Was patient intubated pre/post CPR ? During CPR Was transvenous pacer placed ? Not needed  Medications Administered Include      Yes/no Amiodarone   Atropin   Calcium  yes  Epinephrine  X 3  Lidocaine    Magnesium    Norepinephrine  Already on levophed   Phenylephrine   Sodium bicarbonate  Previously on bic gtt  Vasopression Perviously on this   Evaluation Final Status - Was patient successfully resuscitated ? yes If successfully resuscitated - what is current rhythm ? sinus If successfully resuscitated - what is current hemodynamic status ? Ongoing circulatory shoc  Miscellaneous Information Check all labs  Husband and her sister updated at bedside Grave prognosis explained    SIGNATURE    Dr. Dorethia Geronimo, M.D., F.C.C.P,  Pulmonary and Critical Care Medicine Staff Physician, [facility] Director - Interstitial Lung Disease  Program  Pulmonary Fibrosis [facility] at [facility] [place], [place], [address]   Pager: [phone], If no answer  -> Check AMION or Try [phone] Telephone (clinical office): [phone] Telephone (research): [phone]  1:40 PM 09/04/2024

## 2024-09-04 NOTE — Progress Notes (Signed)
 ATTESTATION & SIGNATURE   STAFF NOTE: I, Dr [name] Cave have personally reviewed patient's available data, including medical history, events of note, physical examination and test results as part of my evaluation. I have discussed with resident/NP and other care providers such as pharmacist, RN and RRT.  In addition,  I personally evaluated patient and elicited key findings of   S: Date of admit 09/03/2024 with LOS 1 for today 09/04/2024 : Rachel Lucas is  not o nvent. Husband at bedside. But per husband say she did complete prednisolone  at dc  - not making urine, rising creat  - ongoing confusion  needing restraints (muttering and moving slightly) - 2 pressors  + bic gtt + got albumin  - persistent high lactate - rising INR   - per husband  - 5d before admit stopped ear drop cipro  due to pain (prematurely)-> CT shows severe mastoiditis left ear this admit  - 2-3 dy beofr admit - likely cellulitis LLE    O:  Blood pressure (!) 87/66, pulse 79, temperature 97.6 F (36.4 C), temperature source Axillary, resp. rate (!) 26, height 5' 3 (1.6 m), weight 63.7 kg, SpO2 92%.   [place]  Jaundiced ? Ascites No active bleeding  LLE discolored calf with edema but no warmth  Husband at bedside   LABS    PULMONARY Recent Labs  Lab 09/04/24 0523 09/04/24 0631  PHART 7.092* 7.285*  PCO2ART 60.0* 44.1  PO2ART 38* 89  HCO3 18.6* 21.3  TCO2 21* 23  O2SAT 56 96    CBC Recent Labs  Lab 08/29/24 1400 09/03/24 1320 09/04/24 0415 09/04/24 0523 09/04/24 0631  HGB 11.4* 9.8* 8.3* 9.2* 8.2*  HCT 34.2* 30.7* 24.4* 27.0* 24.0*  WBC 13.6* 3.3* 5.8  --   --   PLT 102.0* 93* 73*  --   --     COAGULATION Recent Labs  Lab 08/29/24 1400 09/03/24 2130 09/04/24 0415  INR 2.6* 3.9* 3.0*    CARDIAC  No results for input(s): TROPONINI in the last 168 hours. No results for input(s): PROBNP in the last 168 hours.   CHEMISTRY Recent Labs  Lab 08/29/24 1400  09/03/24 1320 09/04/24 0415 09/04/24 0523 09/04/24 0631  NA 134* 130* 124* 129* 129*  K 3.4* 3.7 4.1 4.5 4.2  CL 100 98 95*  --   --   CO2 28 12* 16*  --   --   GLUCOSE 103* 51* 134*  --   --   BUN 12 20 22*  --   --   CREATININE 0.69 2.19* 2.43*  --   --   CALCIUM  8.4 7.6* 7.3*  --   --   MG  --   --  1.3*  --   --   PHOS  --   --  7.4*  --   --    Estimated Creatinine Clearance: 22.1 mL/min (A) (by C-G formula based on SCr of 2.43 mg/dL (H)).   LIVER Recent Labs  Lab 08/29/24 1400 09/03/24 1320 09/03/24 2130 09/04/24 0415  AST 142* 178*  --  126*  ALT 69* 67*  --  55*  ALKPHOS 156* 91  --  57  BILITOT 9.8* 9.8*  --  10.6*  PROT 6.5 5.4*  --  6.1*  ALBUMIN  2.8* 1.8*  --  3.0*  INR 2.6*  --  3.9* 3.0*     INFECTIOUS Recent Labs  Lab 09/03/24 2130 09/04/24 0039 09/04/24 0415  LATICACIDVEN 6.5* 6.8* 5.8*  PROCALCITON 30.61  --   --  ENDOCRINE CBG (last 3)  Recent Labs    09/04/24 0423 09/04/24 0757 09/04/24 1138  GLUCAP 129* 94 114*         IMAGING x24h  - image(s) personally visualized  -   highlighted in bold DG Chest 1 View Result Date: 09/03/2024 CLINICAL DATA:  Septic shock EXAM: PORTABLE CHEST 1 VIEW COMPARISON:  Film from earlier in the same day. FINDINGS: Cardiac shadow is stable. The lungs are well aerated bilaterally. Mild basilar atelectasis is seen. No focal confluent infiltrate is noted. Small right effusion is again seen. No bony abnormality is noted. IMPRESSION: Mild basilar atelectasis and right effusion stable from the prior study. Electronically Signed   By: Oneil Devonshire M.D.   On: 09/03/2024 21:52   CT TEMPORAL BONES WO CONTRAST Result Date: 09/03/2024 EXAM: CT TEMPORAL BONES WITHOUT CONTRAST 09/03/2024 06:32:41 PM TECHNIQUE: CT of the temporal bones was performed without the administration of intravenous contrast. Multiplanar reformatted images are provided for review. Automated exposure control, iterative reconstruction, and/or  weight based adjustment of the mA/kV was utilized to reduce the radiation dose to as low as reasonably achievable. COMPARISON: None available. CLINICAL HISTORY: Hearing loss, mixed. FINDINGS: RIGHT TEMPORAL BONE: EXTERNAL AUDITORY CANAL: Clear. No bony erosion. Scutum is intact. MIDDLE EAR CAVITY: Clear. Ossicular chain is intact. MASTOID AIR CELLS: Clear. INNER EAR: The cochlea and vestibule are unremarkable. Normal mineralization of the otic capsule. Normal semicircular canals. The vestibular aqueduct is not dilated. INTERNAL AUDITORY CANAL: Unremarkable. Normal bony canal of the facial nerve. LEFT TEMPORAL BONE: EXTERNAL AUDITORY CANAL: There is lobulated debris within the left external auditory canal possibly related to history of tympanic membrane perforation. The left tympanic membrane is intact. There is no definite soft tissue swelling or abnormal enhancement along the left external auditory canal. MIDDLE EAR CAVITY: There is complete opacification of the left middle ear. Abnormal soft tissue attenuation surrounds the left ossicles without evidence of erosive change. The left ossicles appear normal in morphology and position. The left tympanic membrane is obscured due to adjacent soft tissue. There is thickening and heterogeneity of the left tympanic membrane likely reflecting chronic changes. MASTOID AIR CELLS: Moderate left mastoid effusion. INNER EAR: The cochlea and vestibule are unremarkable. Normal mineralization of the otic capsule. Normal semicircular canals. The vestibular aqueduct is not dilated. INTERNAL AUDITORY CANAL: Unremarkable. Normal bony canal of the facial nerve. VASCULAR: Normal jugular bulbs. Normal carotid canals. BRAIN: Unremarkable. ORBITS: No acute abnormality. SINUSES: Clear. IMPRESSION: 1. Complete opacification of the left middle ear concerning for otitis media. Underlying small mass cannot be excluded. No erosive change of the ossicles. 2. Moderate left mastoid effusion. 3.  Lobulated debris within the left external auditory canal, possibly related to history of tympanic membrane perforation. 4. Thickening and heterogeneity of the left tympanic membrane, likely reflecting chronic inflammatory changes. 5. Unremarkable CT of the right temporal bone. Electronically signed by: Donnice Mania MD 09/03/2024 07:17 PM EDT RP Workstation: [id number]   CT TIBIA FIBULA LEFT WO CONTRAST Result Date: 09/03/2024 CLINICAL DATA:  Soft tissue swelling in the lower extremity, evaluate for underlying infection EXAM: CT OF THE LOWER LEFT EXTREMITY WITHOUT CONTRAST TECHNIQUE: Multidetector CT imaging of the lower left extremity was performed according to the standard protocol. RADIATION DOSE REDUCTION: This exam was performed according to the departmental dose-optimization program which includes automated exposure control, adjustment of the mA and/or kV according to patient size and/or use of iterative reconstruction technique. COMPARISON:  None Available. FINDINGS: Bones/Joint/Cartilage Distal femur is within  normal limits. Tibia and fibula are well visualized without acute fracture or erosive changes. No joint effusion is noted in the knee. Ligaments Suboptimally assessed by CT. Muscles and Tendons Surrounding musculature reveals fatty infiltration of the medial head of the gastrocnemius. The Achilles tendon is intact. The remainder of the musculature appears within normal limits. Visualized tendons appear intact. Soft tissues Considerable soft tissue edema is noted throughout the lower extremity and extending into the distal thigh. No focal findings to suggest abscess are identified. IMPRESSION: No acute fracture or erosive changes are noted. Considerable subcutaneous edema without definitive abscess. Incidental note is made of fatty infiltration of the medial head of the gastrocnemius. Electronically Signed   By: Oneil Devonshire M.D.   On: 09/03/2024 19:10   US  RENAL Result Date: 09/03/2024 CLINICAL  DATA:  Acute kidney injury EXAM: RENAL / URINARY TRACT ULTRASOUND COMPLETE COMPARISON:  CT abdomen pelvis August 08, 2024 FINDINGS: Right Kidney: Renal measurements: 10.5 x 4.7 x 5.4 cm = volume: 137 mL. Echogenicity is increased. No mass or hydronephrosis visualized. Left Kidney: Renal measurements: 11.2 x 4.9 x 4.5 cm = volume: 132 cm mL. Echogenicity is increased. No mass or hydronephrosis visualized. Bladder: Not well seen due to moderate ascites and under distension. Other: Moderate ascites is identified throughout the abdominopelvic cavity. Cirrhotic liver morphology. IMPRESSION: Bilateral increased renal echogenicity which can be seen the setting of parenchymal renal disease. (hepatocellular disease) Partially visualized cirrhotic liver. Moderate ascites. Electronically Signed   By: Megan  Zare M.D.   On: 09/03/2024 18:53   DG Chest Port 1 View Result Date: 09/03/2024 CLINICAL DATA:  [id number] Sepsis (HCC) [id number] EXAM: PORTABLE CHEST - 1 VIEW COMPARISON:  08/13/2024 FINDINGS: Low lung volumes. Patchy airspace opacities in both lung bases. No pneumothorax. Unchanged trace right pleural effusion. Biapical pleural scarring. No cardiomegaly. Tortuous aorta with aortic atherosclerosis. No acute fracture or destructive lesions. Multilevel thoracic osteophytosis. IMPRESSION: 1. Low lung volumes. Patchy airspace opacities in both lung bases, which may represent atelectasis or a developing bronchopneumonia, in the correct clinical context. 2. Unchanged trace right pleural effusion. Electronically Signed   By: Rogelia Myers M.D.   On: 09/03/2024 17:18      A: -  Severe sepsis + septic shock (? Mastoiditis v LLE celuitis v both) - Acute metabolic enchelpopathy (sepsis + liver)  - Deocmpensated cirroshis 0- AKI heading into failure due to above  -Coagulaopathy (Sepsis/DIC v decomp cirrosis v both)  P:  Fluid resus Scheduled albumin  restart Coontinue bic gtt Rechck lactatem Check DIC panel Await  Doppler LE Get ENT consult - ? Operative indication Likely need sfoley Assess for parancentisis Lactulose  enema 09/04/2024 -> cortrak 09/05/24    Might not survive Husband informed of  above + poor prognosis    Anti-infectives (From admission, onward)    Start     Dose/Rate Route Frequency Ordered Stop   09/04/24 1230  rifaximin  (XIFAXAN ) tablet 550 mg        550 mg Oral 2 times daily 09/04/24 1132     09/04/24 0000  piperacillin -tazobactam (ZOSYN ) IVPB 3.375 g       Placed in Followed by Linked Group   3.375 g 12.5 mL/hr over 240 Minutes Intravenous Every 8 hours 09/03/24 1648     09/03/24 1800  linezolid  (ZYVOX ) IVPB 600 mg        600 mg 300 mL/hr over 60 Minutes Intravenous Every 12 hours 09/03/24 1643     09/03/24 1700  piperacillin -tazobactam (ZOSYN ) IVPB 3.375 g  Placed in Followed by Linked Group   3.375 g 100 mL/hr over 30 Minutes Intravenous  Once 09/03/24 1648 09/03/24 2302   09/03/24 1615  cefTRIAXone  (ROCEPHIN ) 2 g in sodium chloride  0.9 % 100 mL IVPB  Status:  Discontinued        2 g 200 mL/hr over 30 Minutes Intravenous  Once 09/03/24 1609 09/03/24 1641        Rest per NP/medical resident whose note is outlined above and that I agree with  The patient is critically ill with multiple organ systems failure and requires high complexity decision making for assessment and support, frequent evaluation and titration of therapies, application of advanced monitoring technologies and extensive interpretation of multiple databases.   Critical Care Time devoted to patient care services described in this note is  40  Minutes. This time reflects time of care of this signee Dr Dorethia Cave. This critical care time does not reflect procedure time, or teaching time or supervisory time of PA/NP/Med student/Med Resident etc but could involve care discussion time     Dr. Dorethia Cave, M.D., [facility].C.P Pulmonary and Critical Care Medicine Staff Physician [facility] Pulmonary and Critical Care Pager: [phone], If no answer or between  15:00h - 7:00h: call [phone]  09/04/2024 11:43 AM

## 2024-09-04 NOTE — Progress Notes (Signed)
 Echocardiogram 2D Echocardiogram has been performed.  Damien FALCON Germain Koopmann RDCS 09/04/2024, 2:16 PM  Notified Dr Gardenia of stat

## 2024-09-04 NOTE — TOC CM/SW Note (Signed)
 Transition of Care [facility]) - Inpatient Brief Assessment   Patient Details  Name: MILDRETH REEK MRN: [id number] Date of Birth: 1961-04-04  Transition of Care [facility] CM/SW Contact:    Tom-Johnson, Harvest Muskrat, RN Phone Number: 09/04/2024, 12:52 PM   Clinical Narrative:  Patient presented to the ED with Lower Extremity Swelling, Chills and dark Stools. Patient was recently admitted with PNA Bacteremia, Ear Bleeding and Jaundice 08/08/24, treated and was discharged home. Newly diagnosed Decompensated Cirrhosis with Alcoholic Hepatitis. GI following. Patient currently on 14L HFNC. On IV abx, Levo and Vaso.  CM unable to assess at this time, patient with A&O self, CM will continue to follow an assess at an appropriate time.            Transition of Care Asessment: Insurance and Status: Insurance coverage has been reviewed Patient has primary care physician: Yes Home environment has been reviewed: Yes Prior level of function:: Independent Prior/Current Home Services: No current home services Social Drivers of Health Review: SDOH reviewed no interventions necessary Readmission risk has been reviewed: Yes Transition of care needs: no transition of care needs at this time

## 2024-09-04 NOTE — Progress Notes (Signed)
 EEG complete. Results pending.  ?

## 2024-09-04 NOTE — Progress Notes (Signed)
 Responded to CODE BLUE. CPR in progress upon my arrival. I intubated patient with the Glidescope with no problems. ET tube placement confirmed by positive color change on End Tidal CO2 monitor, bilateral BS, chest rise & visibly watching ET tube go through vocal cords. ET tube secured & patient placed on ventilator once pulse returned.

## 2024-09-04 NOTE — Progress Notes (Addendum)
 eLink Physician-Brief Progress Note Patient Name: Rachel Lucas DOB: [birth year] MRN: [id number]   Date of Service  09/04/2024  HPI/Events of Note  INR 5.4, lactic greater than 9  Bicarb drip in dextrose  but still having hypoglycemic events  Persistent metabolic acidosis on gas  eICU Interventions  No intervention for INR/lactate, reflective of ongoing liver injury  Add D10 infusion  Maintain bicarb infusion  Calcium  supplementation   2138 -OG tube in the esophagus, advance the tube, reimage.  2317 -tube currently in place at 75 cm, despite advancement, sideport is still in the esophagus.  Will remove tube  2355 -concern for femoral central line appears to be dripping clear fluid at the insertion site.  Again is saturated.  Will request that the ground team evaluate at bedside for appropriate placement  0212 -now hyperglycemic, D10 held per ground team, orders updated to reflect discontinuation.  Intervention Category Minor Interventions: Routine modifications to care plan (e.g. PRN medications for pain, fever)  Jasir Rother 09/04/2024, 8:06 PM

## 2024-09-04 NOTE — Progress Notes (Signed)
   PM Round with Resient  Critically ill 10cc urine so far 3 pressors Moving 3 of 4 limbs EEKG with encephalopathy DIC + Lactic acidos + Intubated    Interdisciplinary Goals of Care Family Meeting   Date carried out:: 09/04/2024  Location of the meeting: Bedside  Member's involved: Physician, Family Member or next of kin, and Other: her 2 sisters, husband and husband of one of the sister  Durable Power of Pensions consultant or Environmental health practitioner: husband    Discussion: We discussed goals of care for [name] .  Grim prognosis explained and explained indicators of wrosening - CRRT need, worsing ALI, worsening shock, Cardiac arrest, bleeding etc., F  Code status: Full Code - but they are reflecting  Disposition: Continue current acute care   Time spent for the meeting: 15 mn  Rachel Lucas 09/04/2024, 5:39 PM

## 2024-09-04 NOTE — Progress Notes (Addendum)
 NAME:  Rachel Lucas, MRN:  [id number], DOB:  [birth year], LOS: 1 ADMISSION DATE:  09/03/2024, CONSULTATION DATE:  09/03/2024 REFERRING MD:  EDP, CHIEF COMPLAINT:  Left leg pain  History of Present Illness:  Patient is a 63 yo female with new diagnosis of alcoholic cirrhosis with septic shock thought 2/2 LLE cellulitis, does have persistent otitis media from recent prior admission for Klebsiella pneumonia and bacteremia.  Patient had concern for single episode of melena the day prior to admission, but subsequently had normal stools.  Had no cough, congestion, or hematemesis.  Did endorse worsening LLE swelling and redness with some pain the day of admission.  Recently, patient was admitted for Klebciella bacteremia and pneumonia in setting of accidental ear canal perforation with Q-tip (8/20-8/27).  In the ED, patient was unresponsive to fluids with BP 60/30s and PCCM was consulted for admission.  Patient stated that her baseline SBP is in the 90s.   Pertinent  Medical History  Fam hx of VWD but test 8/25 neg Alcoholic cirrhosis recent new diagnosis Sober for 1 month per self and husband   Significant Hospital Events: Including procedures, antibiotic start and stop dates in addition to other pertinent events   9/15 - Admitted, started Zosyn  (9/15-) and linezolid  (9/15-), GI consulted with no concern for GIB, CVC and Art line placed in PM 9/16 - Diagnostic para   Interim History / Subjective:  Last night, CBC and Art line were placed.  Patient has developed worsening encephalopathy overnight into this morning, has been more agitated and pulling at lines, not able to participate in care consistently.  Was placed in 4 point restraints ~0800.  Remains oriented to self, place, month with prompting. Reportedly, patient is not able to reliably swallow medicines or use straw. Bladder scan showed 425 mL, though scan complicated by ascites.  Straight cath was placed and only 35 mL withdrawn  with some difficulty due to edema.   Objective    Blood pressure 111/89, pulse 87, temperature (!) 96.1 F (35.6 C), temperature source Axillary, resp. rate (!) 23, height 5' 3 (1.6 m), weight 63.7 kg, SpO2 100%.        Intake/Output Summary (Last 24 hours) at 09/04/2024 0716 Last data filed at 09/04/2024 0600 Gross per 24 hour  Intake 3403.61 ml  Output 35 ml  Net 3368.61 ml   Filed Weights   09/03/24 1316 09/04/24 0430  Weight: 59 kg 63.7 kg    Examination:  General: Chronically ill-appearing, resting in bed and mildly agitated, alert.  CVC and Art lines in place. HEENT: Head: Normocephalic, atraumatic. No tenderness to percussion over sinuses. Eyes: PERRLA. Scleral icterus. Nose: Non-erythematous turbinates. No rhinorrhea. Mouth/Oral: Clear, no tonsillar exudate. MMM. Neck: Supple. No LAD. Cardiovascular: Regular rate and rhythm. Normal S1/S2. No murmurs, rubs, or gallops appreciated. 2+ radial pulses. Pulmonary: Clear bilaterally to ascultation. No wheezes, crackles, or rhonchi. No increased WOB, no accessory muscle usage on room air. Abdominal: No tenderness to deep or light palpation. Distended with shifting dullness and tympanic to percussion. Skin: Warm and dry. Mildly jaundiced. Extremities: Bilateral 2+ pitting edema to knee.  LLE with moderate erythema, no open lesions, not hot to touch.  Capillary refill <2 seconds, extremities well perfused. Neuro: RASS +1. Oriented to self, place, month.   Resolved problem list  None  Assessment and Plan   Severe sepsis, septic shock Favor LLE cellulitis as most likely etiology, no abscess on CT.  Persistent left AOM on CT temporal bones noted.  Admission CXR with mild bibasilar atelectasis and R effusion.  SBP remains a possibility and diagnostic paracentesis is planned for today.  UA not suggestive of UTI. - Continuing norepi drip, added vasopressin  overnight, CVC and Art line now in place - Goal MAP >65 - Continue  linezolid  and Zosyn  - Hold antihypertensives (spironolactone , propranolol ) - Stress dose hydrocortisone  started 6/15 PM - Midodrine  10 mg PO TID - Follow blood cultures, NGTD @ 1 day - DIC panel - Place Foley catheter - Strep pneumo and legionella urine antigens - LR 1 L bolus again   Decompensated alcoholic cirrhosis with ascites Chronic macrocytic anemia Sober for 1 month per husband and patient.  Unlikely HRS per GI given alternative explanation for AKI of septic shock.  Diagnostic paracentesis planned for today to rule out SBP. - Continue albumin  infusion per HRS protocol - Follow up diagnostic paracentesis - Ocreotide Q8h subQ - Pantoprazole  40 mg BID - Thiamine , folate, MVI PO daily - Lactulose  increased to 20 mg TID  - Cortrak placement tomorrow pending RD staff availability - Added rifaximin  550 mg BID - Hold beta blockers given shock - 2 g Na restricted diet, strict I&Os - US  abdomen RUQ - TTE - GI has seen, no plans for scope unless actually showing s/s of bleeding, similarly no role for octreotide  drip at present (subQ serves similar anyway)  Encephalopathy Possibly infectious in setting of sepsis versus ICU delirium.  Ammonia 33 on admission, taking lactulose , do not suspect hepatic encephalopathy.  Per self and husband, has been sober for 1 month and would not expect acute alcohol withdrawal given this fact. - Continue lactulose  as above - Four point restraints given patient's agitation and risk for pulling out A line - Delirium precautions - OT eval and treat  Thrombocytopenia Received 2 units of FFP overnight, AM plts 73.  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 - RLE SCD for DVT ppx for now  AKI Creatinine remains persistently elevated.  Renal US  showing bilateral increased echogenicity, possible parenchymal renal disease.  Primary AKI likely 2/2 hypovolemia and shock. - Continue bladder scans - 1 L  LR as above  Hypothyroidism - Continue levothyroxine  88 mcg  Full code Full code, confirmed with husband.  Malnutrition - Ensure TID - RD consulted - Cortrak placement tomorrow as above  Recent admission for klebsiella bacteremia and pneumonia iso AOM and accidental TM perforation Mastoid effusion on CT temporal bones Consulted Dr. Tobie with [facility], who related that he has a no suspicion for mastoiditis after reviewing CT imaging.  Noted that patient has a packing wick in place and TM is open, so fluid would be expected but does not suspect mastoiditis based on radiography.  Recommended the following interventions: - Ciprodex  drops BID left ear - Monitor postauricular area for proptosis   Labs   CBC: Recent Labs  Lab 08/29/24 1400 09/03/24 1320 09/04/24 0415 09/04/24 0523 09/04/24 0631  WBC 13.6* 3.3* 5.8  --   --   NEUTROABS 10.2* 2.2  --   --   --   HGB 11.4* 9.8* 8.3* 9.2* 8.2*  HCT 34.2* 30.7* 24.4* 27.0* 24.0*  MCV 111.8 repeated to verify* 120.4* 115.6*  --   --   PLT 102.0* 93* 73*  --   --     Basic Metabolic Panel: Recent Labs  Lab 08/29/24 1400 09/03/24 1320 09/04/24 0415 09/04/24 0523 09/04/24 0631  NA 134* 130* 124* 129* 129*  K 3.4* 3.7 4.1 4.5 4.2  CL 100 98 95*  --   --   CO2 28 12* 16*  --   --   GLUCOSE 103* 51* 134*  --   --   BUN 12 20 22*  --   --   CREATININE 0.69 2.19* 2.43*  --   --   CALCIUM  8.4 7.6* 7.3*  --   --   MG  --   --  1.3*  --   --   PHOS  --   --  7.4*  --   --    GFR: Estimated Creatinine Clearance: 22.1 mL/min (A) (by C-G formula based on SCr of 2.43 mg/dL (H)). Recent Labs  Lab 08/29/24 1400 09/03/24 1320 09/03/24 2130 09/04/24 0039 09/04/24 0415  PROCALCITON  --   --  30.61  --   --   WBC 13.6* 3.3*  --   --  5.8  LATICACIDVEN  --   --  6.5* 6.8* 5.8*    Liver Function Tests: Recent Labs  Lab 08/29/24 1400 09/03/24 1320 09/04/24 0415  AST 142* 178* 126*  ALT 69* 67* 55*  ALKPHOS 156* 91 57   BILITOT 9.8* 9.8* 10.6*  PROT 6.5 5.4* 6.1*  ALBUMIN  2.8* 1.8* 3.0*   Recent Labs  Lab 09/03/24 1320  LIPASE 37   Recent Labs  Lab 09/03/24 1320  AMMONIA 33    ABG    Component Value Date/Time   PHART 7.285 (L) 09/04/2024 0631   PCO2ART 44.1 09/04/2024 0631   PO2ART 89 09/04/2024 0631   HCO3 21.3 09/04/2024 0631   TCO2 23 09/04/2024 0631   ACIDBASEDEF 5.0 (H) 09/04/2024 0631   O2SAT 96 09/04/2024 0631     Coagulation Profile: Recent Labs  Lab 08/29/24 1400 09/03/24 2130 09/04/24 0415  INR 2.6* 3.9* 3.0*    Cardiac Enzymes: No results for input(s): CKTOTAL, CKMB, CKMBINDEX, TROPONINI in the last 168 hours.  HbA1C: No results found for: HGBA1C  CBG: Recent Labs  Lab 09/03/24 1834 09/03/24 1954 09/03/24 2308 09/04/24 0423  GLUCAP 97 85 211* 129*   Past Medical History:  She,  has a past medical history of Hypothyroidism, Thyroid  disease, and Von Willebrand disease (HCC).   Surgical History:   Past Surgical History:  Procedure Laterality Date   CYST REMOVAL NECK  1995     Social History:   reports that she quit smoking about 34 years ago. Her smoking use included cigarettes. She started smoking about 44 years ago. She has a 2 pack-year smoking history. She does not have any smokeless tobacco history on file. She reports that she does not currently use alcohol. She reports that she does not use drugs.   Family History:  Her family history includes Asthma in her mother; Eczema in her mother; Heart Problems in her father.   Allergies Allergies  Allergen Reactions   Levofloxacin Other (See Comments)   Sulfonamide Derivatives Palpitations     Home Medications  Prior to Admission medications   Medication Sig Start Date End Date Taking? Authorizing Provider  folic acid  (FOLVITE ) 1 MG tablet Take 1 tablet (1 mg total) by mouth daily. 08/16/24  Yes Dennise Lavada POUR, MD  furosemide  (LASIX ) 20 MG tablet Take 1 tablet (20 mg total) by mouth  daily. 08/30/24  Yes Craig Palma R, PA-C  lactulose , encephalopathy, (ENULOSE ) 10 GM/15ML SOLN Take 15 mLs (10 g total) by mouth 2 (two) times daily. 08/21/24  Yes Federico Rosario BROCKS, MD  levothyroxine  (SYNTHROID ) 88 MCG tablet Take  1 tablet (88 mcg total) by mouth daily before breakfast. 08/15/24  Yes Singh, Prashant K, MD  midodrine  (PROAMATINE ) 5 MG tablet Take 1 tablet (5 mg total) by mouth 2 (two) times daily with a meal. 08/30/24  Yes Craig Alan SAUNDERS, PA-C  propranolol  (INDERAL ) 10 MG tablet Take 1 tablet (10 mg total) by mouth 2 (two) times daily. 08/30/24  Yes Craig Alan R, PA-C  spironolactone  (ALDACTONE ) 50 MG tablet Take 1 tablet (50 mg total) by mouth daily. 08/30/24  Yes Craig Alan SAUNDERS, PA-C  thiamine  (VITAMIN B1) 100 MG tablet Take 1 tablet (100 mg total) by mouth daily. 08/16/24  Yes Singh, Prashant K, MD  potassium chloride  SA (KLOR-CON  M) 20 MEQ tablet Take 1 tablet (20 mEq total) by mouth daily for 2 days. 08/21/24 08/31/24  Federico Rosario BROCKS, MD     Critical care time: 90 minutes     Bilaal Leib Toma, MD PGY-2, [facility] 09/04/2024, 7:16 AM

## 2024-09-04 NOTE — Progress Notes (Signed)
 [facility] ROUNDING NOTE   Subjective: Patient experienced worsening mental status overnight with agitation and disorientation.  She was not making urine and had increasing pressor requirement and worsening coagulopathy and acidosis which then culminated with a PEA arrest this afternoon.  Patient intubated and has increasing pressor requirements with no urine output. EEG performed with no seizure activity. TTE with depressed EF.  INR rising Has been hypoglycemic this afternoon   Objective: Vital signs in last 24 hours: Temp:  [93.9 F (34.4 C)-97.6 F (36.4 C)] 96.3 F (35.7 C) (09/16 2000) Pulse Rate:  [62-153] 150 (09/16 1900) Resp:  [10-36] 24 (09/16 1900) BP: (67-132)/(24-100) 77/60 (09/16 1900) SpO2:  [7 %-100 %] 38 % (09/16 1946) Arterial Line BP: (81-199)/(43-108) 92/52 (09/16 1900) FiO2 (%):  [40 %-100 %] 60 % (09/16 1946) Weight:  [63.7 kg] 63.7 kg (09/16 0430) Last BM Date : 09/04/24 General: Critically ill appearing, intubated/minimal sedation on fentanyl  gtt Lungs: Rhonchorus breath sounds Heart:  RRR, no m/r/g Abdomen:  Soft, NT, ND, +BS    Intake/Output from previous day: 09/15 0701 - 09/16 0700 In: 3645.4 [I.V.:1496.1; Blood:400; IV Piggyback:1749.3] Out: 35 [Urine:35] Intake/Output this shift: No intake/output data recorded.   Lab Results: Recent Labs    09/03/24 1320 09/04/24 0415 09/04/24 0523 09/04/24 1130 09/04/24 1328 09/04/24 1355 09/04/24 1838 09/04/24 1952  WBC 3.3* 5.8  --   --  6.4  --   --   --   HGB 9.8* 8.3*   < >  --  6.8* 9.2* 8.1* 7.5*  PLT 93* 73*  --  52* 46*  --   --   --   MCV 120.4* 115.6*  --   --  115.8*  --   --   --    < > = values in this interval not displayed.   BMET Recent Labs    09/04/24 0415 09/04/24 0523 09/04/24 1328 09/04/24 1355 09/04/24 1836 09/04/24 1952  NA 124*   < > 130* 128* 127* 124*  K 4.1   < > 4.2 3.9 4.2 4.0  CL 95*  --  91*  --  87*  --   CO2 16*  --  19*  --  15*  --    GLUCOSE 134*  --  92  --  137*  --   BUN 22*  --  21*  --  23*  --   CREATININE 2.43*  --  2.60*  --  2.61*  --   CALCIUM  7.3*  --  10.5*  --  7.8*  --    < > = values in this interval not displayed.   LFT Recent Labs    09/03/24 1320 09/04/24 0415 09/04/24 1328  PROT 5.4* 6.1* 5.4*  ALBUMIN  1.8* 3.0* 3.1*  AST 178* 126* 102*  ALT 67* 55* 39  ALKPHOS 91 57 34*  BILITOT 9.8* 10.6* 8.5*  BILIDIR  --  4.9*  --   IBILI  --  5.7*  --    PT/INR Recent Labs    09/04/24 1328 09/04/24 1836  INR 5.6* 5.4*      Imaging/Other results: DG CHEST PORT 1 VIEW Result Date: 09/04/2024 EXAM: 1 VIEW XRAY OF THE CHEST 09/04/2024 05:35:18 PM COMPARISON: 09/04/2024 CLINICAL HISTORY: Endotracheally intubated; MD approved xrays at bedside. FINDINGS: LINES, TUBES AND DEVICES: Endotracheal tube in place with tip 4.3 cm above the carina. Enteric tube with tip and sidehole overlying the lower esophagus, recommend advancement. External cardiac pacing pads on lower  chest. LUNGS AND PLEURA: Hazy airspace opacities throughout the lungs are increased, likely representing multifocal pneumonia versus pulmonary edema. Bilateral pleural effusions. HEART AND MEDIASTINUM: No acute abnormality of the cardiac and mediastinal silhouettes. BONES AND SOFT TISSUES: No acute osseous abnormality. IMPRESSION: 1. Hazy airspace opacities throughout the lungs, likely representing multifocal pneumonia versus pulmonary edema. 2. Bilateral pleural effusions. 3. Enteric tube with tip and sidehole overlying the lower esophagus, recommend advancement. Electronically signed by: Donnice Mania MD 09/04/2024 07:31 PM EDT RP Workstation: [id number]   DG Abd 1 View Result Date: 09/04/2024 EXAM: 1 VIEW XRAY OF THE ABDOMEN 09/04/2024 05:35:18 PM COMPARISON: 09/04/2024 CLINICAL HISTORY: Endotracheally intubated; MD approved xrays at bedside. FINDINGS: LINES, TUBES AND DEVICES: Enteric tube in distal esophagus. Right femoral vascular catheter in  place. BOWEL: Paucity of gas throughout the abdomen. Gaseous distension of the stomach similar to prior study. SOFT TISSUES: No opaque urinary calculi. BONES: No acute osseous abnormality. IMPRESSION: 1. Gaseous distension of the stomach, similar to prior study. 2. Enteric tube with tip and side hole overlying the lower esophagus. Electronically signed by: Donnice Mania MD 09/04/2024 07:28 PM EDT RP Workstation: [id number]   EEG adult Result Date: 09/04/2024 Shelton Arlin KIDD, MD     09/04/2024  5:32 PM Patient Name: LEITHA HYPPOLITE MRN: [id number] Epilepsy Attending: Arlin KIDD Shelton Referring Physician/Provider: Toma Matas, MD Date: 09/04/2024 Duration: 22.19 mins Patient history: 63yo F with ams getting eeg to evaluate for seizure Level of alertness: Awake AEDs during EEG study: None Technical aspects: This EEG study was done with scalp electrodes positioned according to the 10-20 International system of electrode placement. Electrical activity was reviewed with band pass filter of 1-70Hz , sensitivity of 7 uV/mm, display speed of 62mm/sec with a 60Hz  notched filter applied as appropriate. EEG data were recorded continuously and digitally stored.  Video monitoring was available and reviewed as appropriate. Description: EEG showed continuous generalized 3 to 6 Hz theta-delta slowing. Hyperventilation and photic stimulation were not performed.   ABNORMALITY - Continuous slow, generalized IMPRESSION: This study is suggestive of moderate diffuse encephalopathy. No seizures or epileptiform discharges were seen throughout the recording. Priyanka O Yadav   VAS US  LOWER EXTREMITY VENOUS (DVT) Result Date: 09/04/2024  Lower Venous DVT Study Patient Name:  ELLIA KNOWLTON  Date of Exam:   09/04/2024 Medical Rec #: [id number]           Accession #:    [id number] Date of Birth: 1961-01-31           Patient Gender: F Patient Age:   63 years Exam Location:  [facility] Procedure:      VAS US  LOWER EXTREMITY  VENOUS (DVT) Referring Phys: TORIBIO SHARPS --------------------------------------------------------------------------------  Indications: Edema.  Limitations: Poor ultrasound/tissue interface and line. Comparison Study: No previous exams Performing Technologist: Jody Hill RVT, RDMS  Examination Guidelines: A complete evaluation includes B-mode imaging, spectral Doppler, color Doppler, and power Doppler as needed of all accessible portions of each vessel. Bilateral testing is considered an integral part of a complete examination. Limited examinations for reoccurring indications may be performed as noted. The reflux portion of the exam is performed with the patient in reverse Trendelenburg.  +-----+---------------+---------+-----------+----------+--------------+ RIGHTCompressibilityPhasicitySpontaneityPropertiesThrombus Aging +-----+---------------+---------+-----------+----------+--------------+ CFV                                               not visualized +-----+---------------+---------+-----------+----------+--------------+  CFV/SFJ not visualized due to central and arterial line placement.  +---------+---------------+---------+-----------+----------+--------------+ LEFT     CompressibilityPhasicitySpontaneityPropertiesThrombus Aging +---------+---------------+---------+-----------+----------+--------------+ CFV      Full           No       Yes                                 +---------+---------------+---------+-----------+----------+--------------+ SFJ      Full                                                        +---------+---------------+---------+-----------+----------+--------------+ FV Prox  Full           No       Yes                                 +---------+---------------+---------+-----------+----------+--------------+ FV Mid   Full           No       Yes                                  +---------+---------------+---------+-----------+----------+--------------+ FV DistalFull           No       Yes                                 +---------+---------------+---------+-----------+----------+--------------+ PFV      Full                                                        +---------+---------------+---------+-----------+----------+--------------+ POP      Full           No       Yes                                 +---------+---------------+---------+-----------+----------+--------------+ PTV      Full                                                        +---------+---------------+---------+-----------+----------+--------------+ PERO     Full                                                        +---------+---------------+---------+-----------+----------+--------------+ pulsatile doppler waveforms    Summary: LEFT: - There is no evidence of deep vein thrombosis in the lower extremity.  - No cystic structure found in the popliteal fossa. Diffuse subcutaneous edema throughout lower extremity.  *See table(s) above for measurements and observations. Electronically signed by  Penne Colorado MD on 09/04/2024 at 4:30:43 PM.    Final    DG Abd Portable 1V Result Date: 09/04/2024 EXAM: 1 VIEW XRAY OF THE ABDOMEN 09/04/2024 02:26:00 PM COMPARISON: CT abdomen and pelvis dated 08/08/2024. CLINICAL HISTORY: Encounter for feeding tube placement. Post intubation and feeding tube. FINDINGS: LINES, TUBES AND DEVICES: Enteric tube tip projects over the stomach. Side hole projects over the lower esophagus. Right inferior femoral approach venous catheter tip projects over the right L5 level. BOWEL: Gas filled mildly dilated stomach and a few loops of right-sided bowel loops are present. SOFT TISSUES: No opaque urinary calculi. BONES: No acute osseous abnormality. IMPRESSION: 1. Enteric tube tip projects over the stomach, with side hole projecting over the lower esophagus. Recommend  advancing. Electronically signed by: Manford Cummins MD 09/04/2024 03:12 PM EDT RP Workstation: [id number]   DG CHEST PORT 1 VIEW Result Date: 09/04/2024 CLINICAL DATA:  Status post intubation EXAM: PORTABLE CHEST 1 VIEW COMPARISON:  September 04, 2024 abdominal radiograph and September 03, 2024 chest radiograph FINDINGS: Interval placement of endotracheal tube with tip at the level of the trachea. Recommend retraction 2-3 cm. Interval placement of enteric tube with tip outside the field of view with side port near the distal esophagus, better seen on same day abdominal radiograph. Recommend advancement. Partially imaged linear radiodensity projecting over the left hemithorax and mediastinum, possibly representing overlying lead or support device external to the patient. Low lung volumes with multifocal bilateral airspace opacities, greatest at the right lung base, possibly related to a pulmonary edema and/or infection. Unchanged cardiomediastinal silhouette. No acute osseous findings. IMPRESSION: 1. Endotracheal tube with tip at the level of the carina. Recommend retraction 2-3 cm. 2. Enteric tube with tip better seen on prior same day abdominal radiograph but side port in the distal esophagus. Recommend advancement. 3. Additional overlying radiodensities, favored to represent external leads or support devices. 4. Multifocal pulmonary opacities, greatest at the right lung base, possibly representing multifocal pneumonia with possible underlying pulmonary edema. These results will be called to the ordering clinician or representative by the Radiologist Assistant, and communication documented in the PACS or [employer]. Electronically Signed   By: Michaeline Blanch M.D.   On: 09/04/2024 14:52   US  Abdomen Limited RUQ (LIVER/GB) Result Date: 09/04/2024 EXAM: Right Upper Quadrant Abdominal Ultrasound TECHNIQUE: Real-time ultrasonography of the right upper quadrant of the abdomen was performed. COMPARISON: CT abdomen and  pelvis dated 08/08/2024. CLINICAL HISTORY: Cirrhosis (HCC) FINDINGS: LIVER: The liver demonstrates cirrhotic morphology with nodular hepatic contour. Portal vein is patent. Reversal of portal venous flow. BILIARY SYSTEM: No evidence of wall thickening. Moderate volume layering sludge. No cholelithiasis. Common bile duct measures 5 mm. OTHER: Moderate volume ascites. IMPRESSION: 1. Cirrhotic morphology with sequela of portal hypertension including moderate volume ascites and reversal of portal venous flow. 2. Gallbladder contains moderate volume layering sludge. Electronically signed by: Manford Cummins MD 09/04/2024 02:49 PM EDT RP Workstation: [id number]   ECHOCARDIOGRAM COMPLETE Result Date: 09/04/2024    ECHOCARDIOGRAM REPORT   Patient Name:   CHALONDA SCHLATTER Date of Exam: 09/04/2024 Medical Rec #:  [id number]          Height:       63.0 in Accession #:    [id number]         Weight:       140.4 lb Date of Birth:  [birth year]          BSA:          1.664  m Patient Age:    62 years           BP:           139/85 mmHg Patient Gender: F                  HR:           59 bpm. Exam Location:  Inpatient Procedure: 2D Echo, Color Doppler and Cardiac Doppler (Both Spectral and Color            Flow Doppler were utilized during procedure). Indications:    Cardiac Arrest i46.9  History:        Patient has no prior history of Echocardiogram examinations.  Sonographer:    Damien Senior RDCS Referring Phys: [phone] MURALI RAMASWAMY  Sonographer Comments: Zoll pads still in place post arrest, no parasternals IMPRESSIONS  1. Left ventricular ejection fraction, by estimation, is 35 to 40%. The left ventricle has moderately decreased function. Paradoxical/abnormal septal motion after cardiac arrest possibly secondary to junctional escape. Left ventricular diastolic parameters are indeterminate.  2. Right ventricular systolic function is low normal. The right ventricular size is normal. There is normal pulmonary artery systolic pressure.  The estimated right ventricular systolic pressure is 33.5 mmHg.  3. The mitral valve is grossly normal. Severe mitral valve regurgitation.  4. Tricuspid valve regurgitation is moderate to severe.  5. The aortic valve is grossly normal. Aortic valve regurgitation is not visualized.  6. The inferior vena cava is dilated in size with <50% respiratory variability, suggesting right atrial pressure of 15 mmHg. FINDINGS  Left Ventricle: Left ventricular ejection fraction, by estimation, is 35 to 40%. The left ventricle has moderately decreased function. The left ventricle demonstrates regional wall motion abnormalities. The left ventricular internal cavity size was normal in size. Suboptimal image quality limits for assessment of left ventricular hypertrophy. Left ventricular diastolic parameters are indeterminate. Right Ventricle: The right ventricular size is normal. No increase in right ventricular wall thickness. Right ventricular systolic function is low normal. There is normal pulmonary artery systolic pressure. The tricuspid regurgitant velocity is 2.15 m/s,  and with an assumed right atrial pressure of 15 mmHg, the estimated right ventricular systolic pressure is 33.5 mmHg. Left Atrium: Left atrial size was normal in size. Right Atrium: Right atrial size was normal in size. Pericardium: There is no evidence of pericardial effusion. Mitral Valve: The mitral valve is grossly normal. Severe mitral valve regurgitation. Tricuspid Valve: The tricuspid valve is grossly normal. Tricuspid valve regurgitation is moderate to severe. Aortic Valve: The aortic valve is grossly normal. Aortic valve regurgitation is not visualized. Pulmonic Valve: The pulmonic valve was not assessed. Pulmonic valve regurgitation is not visualized. Aorta: The ascending aorta was not well visualized. Venous: The inferior vena cava is dilated in size with less than 50% respiratory variability, suggesting right atrial pressure of 15 mmHg. IAS/Shunts:  No atrial level shunt detected by color flow Doppler. Additional Comments: Severe ascites is present.  LEFT VENTRICLE PLAX 2D LVOT diam:     2.10 cm LV SV:         29 LV SV Index:   18 LVOT Area:     3.46 cm  LV Volumes (MOD) LV vol d, MOD A4C: 85.2 ml LV vol s, MOD A4C: 50.6 ml LV SV MOD A4C:     85.2 ml RIGHT VENTRICLE RV S prime:     11.60 cm/s TAPSE (M-mode): 2.0 cm LEFT ATRIUM  Index        RIGHT ATRIUM           Index LA Vol (A2C):   47.3 ml 28.43 ml/m  RA Area:     17.10 cm LA Vol (A4C):   42.0 ml 25.24 ml/m  RA Volume:   44.40 ml  26.69 ml/m LA Biplane Vol: 48.0 ml 28.85 ml/m  AORTIC VALVE LVOT Vmax:   57.90 cm/s LVOT Vmean:  43.350 cm/s LVOT VTI:    0.085 m TRICUSPID VALVE TR Peak grad:   18.5 mmHg TR Vmax:        215.00 cm/s  SHUNTS Systemic VTI:  0.08 m Systemic Diam: 2.10 cm Aditya Sabharwal Electronically signed by Ria Commander Signature Date/Time: 09/04/2024/2:27:44 PM    Final    DG Chest 1 View Result Date: 09/03/2024 CLINICAL DATA:  Septic shock EXAM: PORTABLE CHEST 1 VIEW COMPARISON:  Film from earlier in the same day. FINDINGS: Cardiac shadow is stable. The lungs are well aerated bilaterally. Mild basilar atelectasis is seen. No focal confluent infiltrate is noted. Small right effusion is again seen. No bony abnormality is noted. IMPRESSION: Mild basilar atelectasis and right effusion stable from the prior study. Electronically Signed   By: Oneil Devonshire M.D.   On: 09/03/2024 21:52   CT TEMPORAL BONES WO CONTRAST Result Date: 09/03/2024 EXAM: CT TEMPORAL BONES WITHOUT CONTRAST 09/03/2024 06:32:41 PM TECHNIQUE: CT of the temporal bones was performed without the administration of intravenous contrast. Multiplanar reformatted images are provided for review. Automated exposure control, iterative reconstruction, and/or weight based adjustment of the mA/kV was utilized to reduce the radiation dose to as low as reasonably achievable. COMPARISON: None available. CLINICAL HISTORY:  Hearing loss, mixed. FINDINGS: RIGHT TEMPORAL BONE: EXTERNAL AUDITORY CANAL: Clear. No bony erosion. Scutum is intact. MIDDLE EAR CAVITY: Clear. Ossicular chain is intact. MASTOID AIR CELLS: Clear. INNER EAR: The cochlea and vestibule are unremarkable. Normal mineralization of the otic capsule. Normal semicircular canals. The vestibular aqueduct is not dilated. INTERNAL AUDITORY CANAL: Unremarkable. Normal bony canal of the facial nerve. LEFT TEMPORAL BONE: EXTERNAL AUDITORY CANAL: There is lobulated debris within the left external auditory canal possibly related to history of tympanic membrane perforation. The left tympanic membrane is intact. There is no definite soft tissue swelling or abnormal enhancement along the left external auditory canal. MIDDLE EAR CAVITY: There is complete opacification of the left middle ear. Abnormal soft tissue attenuation surrounds the left ossicles without evidence of erosive change. The left ossicles appear normal in morphology and position. The left tympanic membrane is obscured due to adjacent soft tissue. There is thickening and heterogeneity of the left tympanic membrane likely reflecting chronic changes. MASTOID AIR CELLS: Moderate left mastoid effusion. INNER EAR: The cochlea and vestibule are unremarkable. Normal mineralization of the otic capsule. Normal semicircular canals. The vestibular aqueduct is not dilated. INTERNAL AUDITORY CANAL: Unremarkable. Normal bony canal of the facial nerve. VASCULAR: Normal jugular bulbs. Normal carotid canals. BRAIN: Unremarkable. ORBITS: No acute abnormality. SINUSES: Clear. IMPRESSION: 1. Complete opacification of the left middle ear concerning for otitis media. Underlying small mass cannot be excluded. No erosive change of the ossicles. 2. Moderate left mastoid effusion. 3. Lobulated debris within the left external auditory canal, possibly related to history of tympanic membrane perforation. 4. Thickening and heterogeneity of the left  tympanic membrane, likely reflecting chronic inflammatory changes. 5. Unremarkable CT of the right temporal bone. Electronically signed by: Donnice Mania MD 09/03/2024 07:17 PM EDT RP Workstation: [id number]  CT TIBIA FIBULA LEFT WO CONTRAST Result Date: 09/03/2024 CLINICAL DATA:  Soft tissue swelling in the lower extremity, evaluate for underlying infection EXAM: CT OF THE LOWER LEFT EXTREMITY WITHOUT CONTRAST TECHNIQUE: Multidetector CT imaging of the lower left extremity was performed according to the standard protocol. RADIATION DOSE REDUCTION: This exam was performed according to the departmental dose-optimization program which includes automated exposure control, adjustment of the mA and/or kV according to patient size and/or use of iterative reconstruction technique. COMPARISON:  None Available. FINDINGS: Bones/Joint/Cartilage Distal femur is within normal limits. Tibia and fibula are well visualized without acute fracture or erosive changes. No joint effusion is noted in the knee. Ligaments Suboptimally assessed by CT. Muscles and Tendons Surrounding musculature reveals fatty infiltration of the medial head of the gastrocnemius. The Achilles tendon is intact. The remainder of the musculature appears within normal limits. Visualized tendons appear intact. Soft tissues Considerable soft tissue edema is noted throughout the lower extremity and extending into the distal thigh. No focal findings to suggest abscess are identified. IMPRESSION: No acute fracture or erosive changes are noted. Considerable subcutaneous edema without definitive abscess. Incidental note is made of fatty infiltration of the medial head of the gastrocnemius. Electronically Signed   By: Oneil Devonshire M.D.   On: 09/03/2024 19:10   US  RENAL Result Date: 09/03/2024 CLINICAL DATA:  Acute kidney injury EXAM: RENAL / URINARY TRACT ULTRASOUND COMPLETE COMPARISON:  CT abdomen pelvis August 08, 2024 FINDINGS: Right Kidney: Renal measurements:  10.5 x 4.7 x 5.4 cm = volume: 137 mL. Echogenicity is increased. No mass or hydronephrosis visualized. Left Kidney: Renal measurements: 11.2 x 4.9 x 4.5 cm = volume: 132 cm mL. Echogenicity is increased. No mass or hydronephrosis visualized. Bladder: Not well seen due to moderate ascites and under distension. Other: Moderate ascites is identified throughout the abdominopelvic cavity. Cirrhotic liver morphology. IMPRESSION: Bilateral increased renal echogenicity which can be seen the setting of parenchymal renal disease. (hepatocellular disease) Partially visualized cirrhotic liver. Moderate ascites. Electronically Signed   By: Megan  Zare M.D.   On: 09/03/2024 18:53   DG Chest Port 1 View Result Date: 09/03/2024 CLINICAL DATA:  [id number] Sepsis (HCC) [id number] EXAM: PORTABLE CHEST - 1 VIEW COMPARISON:  08/13/2024 FINDINGS: Low lung volumes. Patchy airspace opacities in both lung bases. No pneumothorax. Unchanged trace right pleural effusion. Biapical pleural scarring. No cardiomegaly. Tortuous aorta with aortic atherosclerosis. No acute fracture or destructive lesions. Multilevel thoracic osteophytosis. IMPRESSION: 1. Low lung volumes. Patchy airspace opacities in both lung bases, which may represent atelectasis or a developing bronchopneumonia, in the correct clinical context. 2. Unchanged trace right pleural effusion. Electronically Signed   By: Rogelia Myers M.D.   On: 09/03/2024 17:18      Assessment and Plan:  63 year old female with decompensated alcohol-induced cirrhosis, admitted with septic shock, suspect secondary to cellulitis vs otitis.  Unfortunately, the patient has rapidly declined with multiorgan failure despite appropriate treatment and resuscitation measures by the PCCM team.  She is on broad spectrum antibiotics with three pressors with low MAPs, oligouric with worsening acidosis, coagulopathy and now with hypoglycemia which is an indicator of overt liver failure.   Worsening anemia  without overt bleeding currently, although she is at very high risk for bleeding given severe coagulopathy/DIC presentation. Her prognosis remains extremely grim.    There are no additional recommendations from a GI standpoint.    Defer ongoing resuscitation to PCCM team with pressors, bicarb gtt, octreotide , albumin  for volume, PPI, lactulose  per  feeding tube vs enemas.  GI will follow peripherally and be available if there are specific questions we may be able to help with.   Glendia FORBES Holt, MD  09/04/2024, 8:51 PM [facility]

## 2024-09-05 ENCOUNTER — Inpatient Hospital Stay (HOSPITAL_COMMUNITY)

## 2024-09-05 DIAGNOSIS — K767 Hepatorenal syndrome: Secondary | ICD-10-CM

## 2024-09-05 DIAGNOSIS — K7290 Hepatic failure, unspecified without coma: Secondary | ICD-10-CM

## 2024-09-05 DIAGNOSIS — N179 Acute kidney failure, unspecified: Secondary | ICD-10-CM

## 2024-09-05 DIAGNOSIS — D684 Acquired coagulation factor deficiency: Secondary | ICD-10-CM

## 2024-09-05 DIAGNOSIS — K7040 Alcoholic hepatic failure without coma: Secondary | ICD-10-CM | POA: Diagnosis not present

## 2024-09-05 DIAGNOSIS — K7682 Hepatic encephalopathy: Secondary | ICD-10-CM

## 2024-09-05 DIAGNOSIS — A419 Sepsis, unspecified organism: Secondary | ICD-10-CM | POA: Diagnosis not present

## 2024-09-05 DIAGNOSIS — R6521 Severe sepsis with septic shock: Secondary | ICD-10-CM | POA: Diagnosis not present

## 2024-09-05 LAB — POCT I-STAT 7, (LYTES, BLD GAS, ICA,H+H)
Acid-base deficit: 4 mmol/L — ABNORMAL HIGH (ref 0.0–2.0)
Bicarbonate: 20.9 mmol/L (ref 20.0–28.0)
Calcium, Ion: 1.02 mmol/L — ABNORMAL LOW (ref 1.15–1.40)
HCT: 25 % — ABNORMAL LOW (ref 36.0–46.0)
Hemoglobin: 8.5 g/dL — ABNORMAL LOW (ref 12.0–15.0)
O2 Saturation: 96 %
Patient temperature: 96.2
Potassium: 3.8 mmol/L (ref 3.5–5.1)
Sodium: 124 mmol/L — ABNORMAL LOW (ref 135–145)
TCO2: 22 mmol/L (ref 22–32)
pCO2 arterial: 36.5 mmHg (ref 32–48)
pH, Arterial: 7.359 (ref 7.35–7.45)
pO2, Arterial: 83 mmHg (ref 83–108)

## 2024-09-05 LAB — CBC
HCT: 23.4 % — ABNORMAL LOW (ref 36.0–46.0)
Hemoglobin: 8 g/dL — ABNORMAL LOW (ref 12.0–15.0)
MCH: 35.6 pg — ABNORMAL HIGH (ref 26.0–34.0)
MCHC: 34.2 g/dL (ref 30.0–36.0)
MCV: 104 fL — ABNORMAL HIGH (ref 80.0–100.0)
Platelets: 44 K/uL — ABNORMAL LOW (ref 150–400)
RBC: 2.25 MIL/uL — ABNORMAL LOW (ref 3.87–5.11)
WBC: 12 K/uL — ABNORMAL HIGH (ref 4.0–10.5)
nRBC: 0.2 % (ref 0.0–0.2)

## 2024-09-05 LAB — LIPASE, BLOOD: Lipase: 37 U/L (ref 11–51)

## 2024-09-05 LAB — BLOOD CULTURE ID PANEL (REFLEXED) - BCID2

## 2024-09-05 LAB — GLUCOSE, CAPILLARY
Glucose-Capillary: 125 mg/dL — ABNORMAL HIGH (ref 70–99)
Glucose-Capillary: 160 mg/dL — ABNORMAL HIGH (ref 70–99)
Glucose-Capillary: 185 mg/dL — ABNORMAL HIGH (ref 70–99)
Glucose-Capillary: 188 mg/dL — ABNORMAL HIGH (ref 70–99)
Glucose-Capillary: 220 mg/dL — ABNORMAL HIGH (ref 70–99)
Glucose-Capillary: 245 mg/dL — ABNORMAL HIGH (ref 70–99)
Glucose-Capillary: 252 mg/dL — ABNORMAL HIGH (ref 70–99)
Glucose-Capillary: 285 mg/dL — ABNORMAL HIGH (ref 70–99)

## 2024-09-05 LAB — TROPONIN I (HIGH SENSITIVITY): Troponin I (High Sensitivity): 1833 ng/L (ref <18)

## 2024-09-05 LAB — PREPARE FRESH FROZEN PLASMA
Unit division: 0
Unit division: 0

## 2024-09-05 LAB — COMPREHENSIVE METABOLIC PANEL WITH GFR
ALT: 38 U/L (ref 0–44)
AST: 100 U/L — ABNORMAL HIGH (ref 15–41)
Albumin: 3.5 g/dL (ref 3.5–5.0)
Alkaline Phosphatase: 30 U/L — ABNORMAL LOW (ref 38–126)
Anion gap: 19 — ABNORMAL HIGH (ref 5–15)
BUN: 24 mg/dL — ABNORMAL HIGH (ref 6–20)
CO2: 19 mmol/L — ABNORMAL LOW (ref 22–32)
Calcium: 8 mg/dL — ABNORMAL LOW (ref 8.9–10.3)
Chloride: 85 mmol/L — ABNORMAL LOW (ref 98–111)
Creatinine, Ser: 2.81 mg/dL — ABNORMAL HIGH (ref 0.44–1.00)
GFR, Estimated: 19 mL/min — ABNORMAL LOW (ref >60)
Glucose, Bld: 250 mg/dL — ABNORMAL HIGH (ref 70–99)
Potassium: 3.8 mmol/L (ref 3.5–5.1)
Sodium: 123 mmol/L — ABNORMAL LOW (ref 135–145)
Total Bilirubin: 9.8 mg/dL — ABNORMAL HIGH (ref 0.0–1.2)
Total Protein: 5.6 g/dL — ABNORMAL LOW (ref 6.5–8.1)

## 2024-09-05 LAB — BPAM FFP
Blood Product Expiration Date: 202509202359
ISSUE DATE / TIME: 202509152323
ISSUE DATE / TIME: 202509160059
ISSUE DATE / TIME: 202509202359
Unit Type and Rh: 202509202359
Unit Type and Rh: 202509202359
Unit Type and Rh: 5100
Unit Type and Rh: 9500

## 2024-09-05 LAB — MAGNESIUM: Magnesium: 2.6 mg/dL — ABNORMAL HIGH (ref 1.7–2.4)

## 2024-09-05 LAB — AMYLASE: Amylase: 41 U/L (ref 28–100)

## 2024-09-05 LAB — STREP PNEUMONIAE URINARY ANTIGEN: Strep Pneumo Urinary Antigen: NEGATIVE

## 2024-09-05 LAB — CK: Total CK: 476 U/L — ABNORMAL HIGH (ref 38–234)

## 2024-09-05 LAB — LACTIC ACID, PLASMA: Lactic Acid, Venous: 9 mmol/L (ref 0.5–1.9)

## 2024-09-05 LAB — PHOSPHORUS: Phosphorus: 7.4 mg/dL — ABNORMAL HIGH (ref 2.5–4.6)

## 2024-09-05 MED ORDER — PRISMASOL BGK 4/2.5 32-4-2.5 MEQ/L EC SOLN: Status: DC

## 2024-09-05 MED ORDER — VANCOMYCIN HCL 1500 MG/300ML IV SOLN
1500.0000 mg | Freq: Once | INTRAVENOUS | Status: AC
  Administered 2024-09-05: 1500 mg via INTRAVENOUS
  Filled 2024-09-05: qty 300

## 2024-09-05 MED ORDER — SODIUM CHLORIDE 0.9% IV SOLUTION: Freq: Once | INTRAVENOUS | Status: AC

## 2024-09-05 MED ORDER — SODIUM CHLORIDE 0.9% IV SOLUTION: Freq: Once | INTRAVENOUS | Status: DC

## 2024-09-05 MED ORDER — LEVOTHYROXINE SODIUM 88 MCG PO TABS
88.0000 ug | ORAL_TABLET | Freq: Every day | ORAL | Status: DC
  Administered 2024-09-06 – 2024-09-17 (×12): 88 ug
  Filled 2024-09-05 (×13): qty 1

## 2024-09-05 MED ORDER — VANCOMYCIN VARIABLE DOSE PER UNSTABLE RENAL FUNCTION (PHARMACIST DOSING): Status: DC

## 2024-09-05 MED ORDER — ALBUMIN HUMAN 25 % IV SOLN
25.0000 g | Freq: Four times a day (QID) | INTRAVENOUS | Status: AC
  Administered 2024-09-05 – 2024-09-06 (×5): 25 g via INTRAVENOUS
  Filled 2024-09-05 (×5): qty 100

## 2024-09-05 MED ORDER — RIFAXIMIN 550 MG PO TABS
550.0000 mg | ORAL_TABLET | Freq: Two times a day (BID) | ORAL | Status: DC
  Administered 2024-09-05 – 2024-09-10 (×8): 550 mg
  Filled 2024-09-05 (×8): qty 1

## 2024-09-05 MED ORDER — ARTIFICIAL TEARS OPHTHALMIC OINT: TOPICAL_OINTMENT | OPHTHALMIC | Status: DC | PRN

## 2024-09-05 MED ORDER — ORAL CARE MOUTH RINSE
15.0000 mL | OROMUCOSAL | Status: DC
Start: 2024-09-05 — End: 2024-09-14
  Administered 2024-09-05 – 2024-09-14 (×108): 15 mL via OROMUCOSAL

## 2024-09-05 MED ORDER — FOLIC ACID 1 MG PO TABS
1.0000 mg | ORAL_TABLET | Freq: Every day | ORAL | Status: DC
  Administered 2024-09-05 – 2024-09-17 (×11): 1 mg
  Filled 2024-09-05 (×11): qty 1

## 2024-09-05 MED ORDER — PRISMASOL BGK 4/2.5 32-4-2.5 MEQ/L EC SOLN
Status: DC
Start: 2024-09-05 — End: 2024-09-13

## 2024-09-05 MED ORDER — POLYETHYLENE GLYCOL 3350 17 G PO PACK: 17.0000 g | PACK | Freq: Every day | ORAL | Status: DC | PRN

## 2024-09-05 MED ORDER — DOCUSATE SODIUM 50 MG/5ML PO LIQD: 100.0000 mg | Freq: Two times a day (BID) | ORAL | Status: DC | PRN

## 2024-09-05 MED ORDER — CALCIUM GLUCONATE-NACL 1-0.675 GM/50ML-% IV SOLN
1.0000 g | Freq: Once | INTRAVENOUS | Status: DC
Start: 2024-09-05 — End: 2024-09-05

## 2024-09-05 MED ORDER — THIAMINE MONONITRATE 100 MG PO TABS
100.0000 mg | ORAL_TABLET | Freq: Every day | ORAL | Status: DC
  Administered 2024-09-05 – 2024-09-17 (×11): 100 mg
  Filled 2024-09-05 (×11): qty 1

## 2024-09-05 MED ORDER — MIDODRINE HCL 5 MG PO TABS
10.0000 mg | ORAL_TABLET | Freq: Three times a day (TID) | ORAL | Status: DC
  Administered 2024-09-05 – 2024-09-06 (×3): 10 mg
  Filled 2024-09-05 (×3): qty 2

## 2024-09-05 MED ORDER — HEPARIN SODIUM (PORCINE) 1000 UNIT/ML DIALYSIS
1000.0000 [IU] | INTRAMUSCULAR | Status: DC | PRN
  Administered 2024-09-08: 2400 [IU] via INTRAVENOUS_CENTRAL
  Filled 2024-09-05: qty 6
  Filled 2024-09-05: qty 4

## 2024-09-05 NOTE — Progress Notes (Signed)
 Pharmacy Antibiotic Note  Rachel Lucas is a 63 y.o. female admitted on 09/03/2024 with septic shock and cellulitis.  Pharmacy has been consulted for Vancomycin  dosing.  WBC 12.0, L acid 9.0, plt down to 44.   Plan: Vancomycin  1500 mg x1  Will base further dosing / levels based on nephrology plan   Height: 5' 3 (160 cm) Weight: 72.4 kg (159 lb 9.8 oz) IBW/kg (Calculated) : 52.4  Temp (24hrs), Avg:96.2 F (35.7 C), Min:95.2 F (35.1 C), Max:96.5 F (35.8 C)  Recent Labs  Lab 08/29/24 1400 09/03/24 1320 09/03/24 2130 09/04/24 0415 09/04/24 1130 09/04/24 1328 09/04/24 1347 09/04/24 1836 09/05/24 0320  WBC 13.6* 3.3*  --  5.8  --  6.4  --   --  12.0*  CREATININE 0.69 2.19*  --  2.43*  --  2.60*  --  2.61* 2.81*  LATICACIDVEN  --   --    < > 5.8* 6.1*  --  >9.0* >9.0* 9.0*   < > = values in this interval not displayed.    Estimated Creatinine Clearance: 20.3 mL/min (A) (by C-G formula based on SCr of 2.81 mg/dL (H)).    Allergies  Allergen Reactions   Levofloxacin Other (See Comments)   Sulfonamide Derivatives Palpitations    Antimicrobials this admission: 9/15 linezolid >> 9/16 9/17 vancomycin  >>  9/15 zosyn >>  Microbiology results: 9/15 blood>>ngtd 9/15 resp panel neg Strep urine Ag neg  Legionella Ag pending  MRSA nares neg   Thank you for allowing pharmacy to be a part of this patient's care.  Rankin Sams 09/05/2024 8:01 AM

## 2024-09-05 NOTE — Progress Notes (Signed)
 OT Cancellation Note  Patient Details Name: Rachel Lucas MRN: [id number] DOB: 1961-09-30   Cancelled Treatment:    Reason Eval/Treat Not Completed: Patient not medically ready;Medical issues which prohibited therapy (Pt intubated after PEA. Will follow up on a later date and assess wehn appropriate.)  Gal Feldhaus,HILLARY 09/05/2024, 7:40 AM Kreg Sink, OT/L   Acute OT Clinical Specialist Acute Rehabilitation Services Pager [phone] Office [phone]

## 2024-09-05 NOTE — Progress Notes (Addendum)
 NAME:  Rachel Lucas, MRN:  [id number], DOB:  Mar 23, 1961, LOS: 2 ADMISSION DATE:  09/03/2024, CONSULTATION DATE:  09/05/2024 REFERRING MD:  EDP, CHIEF COMPLAINT:  Left leg pain   History of Present Illness:  Rachel Lucas is a 63 yo female with new diagnosis of alcoholic cirrhosis with septic shock thought 2/2 LLE cellulitis, does have persistent otitis media from recent prior admission for Klebsiella pneumonia and bacteremia.   Rachel Lucas had concern for single episode of melena the day prior to admission, but subsequently had normal stools.  Had no cough, congestion, or hematemesis.  Did endorse worsening LLE swelling and redness with some pain the day of admission.   Recently, Rachel Lucas was admitted for Klebciella bacteremia and pneumonia in setting of accidental ear canal perforation with Q-tip (8/20-8/27).   In the ED, Rachel Lucas was unresponsive to fluids with BP 60/30s and PCCM was consulted for admission.  Rachel Lucas stated that her baseline SBP is in the 90s.   Pertinent  Medical History  Fam hx of VWD but test 8/25 neg Alcoholic cirrhosis recent new diagnosis Sober for 1 month per self and husband   Significant Hospital Events: Including procedures, antibiotic start and stop dates in addition to other pertinent events   9/15 - Admitted, started Zosyn  (9/15-) and linezolid  (9/15-), GI consulted with no concern for GIB, CVC and Art line placed in PM 9/16 - Worsening delirium, PEA arrest for 6 minutes with ROSC, NG tube in place   Interim History / Subjective:  Overnight, Rachel Lucas did become hyperglycemic after switch to D10 fluids. Has had minimal urine output at 50 mL. Has not been agitated, occasionally spontaneously moves extremities.  Updated Rachel Lucas's spouse this morning on clinical status and changes overnight, discussed prognosis moving forward and indicated that prognosis remains poor with high mortality rate.  Briefly introduced possibility of CRRT for the future.   Objective     Blood pressure (!) 63/53, pulse (!) 54, temperature (!) 95.2 F (35.1 C), temperature source Esophageal, resp. rate (!) 24, height 5' 3 (1.6 m), weight 72.4 kg, SpO2 97%.    Vent Mode: PRVC FiO2 (%):  [40 %-100 %] 60 % Set Rate:  [24 bmp] 24 bmp Vt Set:  [420 mL] 420 mL PEEP:  [5 cmH20] 5 cmH20 Plateau Pressure:  [25 cmH20-32 cmH20] 25 cmH20   Intake/Output Summary (Last 24 hours) at 09/05/2024 0715 Last data filed at 09/05/2024 0600 Gross per 24 hour  Intake 7336.21 ml  Output 400 ml  Net 6936.21 ml   Filed Weights   09/03/24 1316 09/04/24 0430 09/05/24 0500  Weight: 59 kg 63.7 kg 72.4 kg    Examination: General: Sedated, intubated, NAD. HEENT: OGT in place.  Mild facial bruising. Cardiovascular: Regular rate and rhythm. Normal S1/S2. No murmurs, rubs, or gallops appreciated. 2+ radial pulses.  No sternal crepitus. Pulmonary: Scattered crackles globally and diminished breath sounds on auscultation.  Intubated on ventilator at 50% FiO2. Abdominal: Moderately distended with dullness to percussion.  No solid masses palpable. Skin: Jaundiced, scattered ecchymosis.  Thin, papery skin. Extremities: 2+ pitting edema bilaterally to knee.  Mottled legs bilaterally.  Unable to obtain arterial Doppler L PT, DP obtained.  Notable erythema and warmth across LLE consistent with cellulitis.  Warm toes bilaterally, perfusing.  Capillary refill 2-3 seconds. Neuro: RASS -2.  Moving extremities spontaneously, in particular on R.  Pupils reactive.  Assessment and Plan   PULMONARY: Acute lung injury, ARDS, acute hypoxemic respiratory failure A:  Remains intubated on PRVC since  9/16.  Viral and bacterial pathogen panels negative. - 09/05/2024: Repeat chest x-ray this morning consistent with ARDS.  ABG with resolved acidosis, good oxygenation.  Currently on 50% FiO2.  P: - Continue PRVC, continue pulmonary and oral hygiene - Strep pneumo urinary antigen negative, Legionella - Stress dose  hydrocortisone  100 mg Q8h   NEUROLOGIC: Acute metabolic encephalopathy 2/2 sepsis and cirrhosis A:   Given 6-minute arrest yesterday, it is possible Rachel Lucas will have good neurological recovery if current illness is able to be treated.  Postarrest EEG with diffuse encephalopathy.  Will continue to monitor for neurologic recovery after arrest.  Has been sober for 1 month, no concern for alcohol withdrawal. - 09/05/2024: Sedated, encephalopathic. P:   - Sedation with fentanyl  gtt and Versed  PRN, RASS goal -1 to -2 - NSE at 72 hours postarrest on 9/19   VASCULAR: Septic shock A:   Currently in septic shock requiring pressor support. - 09/05/2024: Pressor requirement has decreased overnight and BP remains stable.  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  Possible cardiomyopathy in setting of sepsis versus underlying cardiac dysfunction. - 09/05/2024: Troponin remains elevated at ~1800.  P: - Repeat troponin tomorrow a.m.   CARDIAC ELECTRICAL: Status post PEA arrest 9/16 A: ROSC achieved after 6 minutes.  Suspect septic shock is most likely cause.  Postarrest EKG yesterday without acute ischemic changes to suggest MI, normal QTc. - 09/05/2024: NSR on monitor.  P: - Continuous cardiac monitoring   INFECTIOUS: Severe sepsis with septic shock (?LLE cellulitis) A:   Currently with severe sepsis and septic shock, likely 2/2 LLE cellulitis.  Per ENT consult 9/16, do not favor mastoiditis or AOM as source. - 09/05/2024: Lactate remains at 9 with poor clearance, suggesting poorer prognosis.  MRSA nares negative, but still evidence of cellulitis of LLE, will continue coverage for strep.  P:   - Continue Zosyn  - Transition from linezolid  to vancomycin  per pharmacy given  progression of thrombocytopenia - Follow Bcx, NGTD @ 2 days - Daily and PM lactate   RENAL: AKI approaching renal failure A:  50 mL UOP overnight, minimal amount.  Net +10.5 L this admission.  UOP will be an important post-arrest prognostic sign.  Concern for hepatorenal syndrome. - 09/05/2024: pH reassuringly improved to 7.35, electrolytes holding stable.  Rachel Lucas nearing threshold for CRRT.  P:  - Foley in place - Consulted Renal for evaluation for CRRT; will be shared decision making with family - Continue bicarb drip 125 mL/hr   METABOLIC A: Increased metabolic demand given MODS and septic shock.  P: - Appreciate RD consult, Cortrak placement today - Ensure TID with Cortrak place   ELECTROLYTES A:  Goal to optimize electrolytes given risk for repeat arrest. - 09/05/2024: Electrolytes stable.  P: - NPO, oral meds per tube - Renal evaluating for CRRT as above - Calcium  gluconate held given high phosphorus, can consider for cardiac membrane stabilization if CRRT is initiated - Daily phosphorus and magnesium , CMP   GASTROINTESTINAL: Decompensated alcoholic cirrhosis with ascites, shock liver A:   Rachel Lucas does have decompensated alcoholic cirrhosis with moderate ascites per US  9/16.  Cirrhosis significantly worsens prognosis for this severe illness. - 09/05/2024: Plan for diagnostic paracentesis per IR if able to perform with adequate fluid for procedure.  INR and  lactate remain elevated, likely reflective of ongoing liver injury.  P:   - Ocreotide Q8h subQ - Pantoprazole  40 mg BID - Rifaximin  550 mg BID - Thiamine , folate, MVI PO daily - Hold beta blockers given shock  - Status post 8 albumin  infusions 25 g Q6h, will do 1 more day x4 doses - Ultrasound-guided diagnostic paracentesis per IR at bedside if enough fluid to perform successfully - Lactulose  30 mg TID per tube             - Cortrak placement today   HEMATOLOGIC: Chronic macrocytic anemia A:  Concern for  MODS and cytokine storm post arrest iso septic shock.  Status post 1 unit PRBC 9/16. - 09/05/2024: Hemoglobin stable at 8, minimal oozing or bleeding.  No indication for repeat PRBC today.  P: - Continue to trend H&H - PRBC for hgb </= 6.9   HEMATOLOGIC - Platelets: Coagulopathy (DIC/sepsis vs decompensated cirrhosis) A: Has underlying hepatic dysfunction given cirrhosis.  Currently with DIC. - 09/05/2024: Platelets continue downtrending, likely hepatic dysfunction and DIC, but will also seek to optimize medications.  P: - Consider FFP if platelets <10 or signs of bleeding - Switch from linezolid  to vancomycin  as above - RLE SCD for DVT ppx for now    ENDOCRINE: Hypothyroidism A:   Rachel Lucas with history of hypothyroidism. - 09/05/2024: Recent CBGs have been elevated in 200s, D10 was stopped overnight, will continue to monitor.  P: - Continue levothyroxine  88 mcg - Stress dose steroids as above - POCT CBGs Q4h   MSK/DERM:  A: Bruising easily given hematologic dysfunction/DIC.  Likely cellulitis of LLE. - 09/05/2024: Lower concern for rhabdomyolysis, but with worsening appearance of left lower extremity and poorly detectable pulses will follow closely.  DVT ultrasound reassuringly negative bilaterally.  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 Tobie with [facility] 9/16, who related that he has a no suspicion for mastoiditis after reviewing CT imaging.  Noted that Rachel Lucas has a packing wick in place and TM is open, so fluid would be expected but does not suspect mastoiditis based on radiography.  P: - Ciprodex  drops BID left ear - Monitor postauricular area for proptosis   Labs   CBC: Recent Labs  Lab 08/29/24 1400 09/03/24 1320 09/04/24 0415 09/04/24 0523 09/04/24 1130 09/04/24 1328 09/04/24 1355 09/04/24 1838 09/04/24 1952 09/05/24 0320  WBC 13.6* 3.3* 5.8  --    --  6.4  --   --   --  12.0*  NEUTROABS 10.2* 2.2  --   --   --   --   --   --   --   --   HGB 11.4* 9.8* 8.3*   < >  --  6.8* 9.2* 8.1* 7.5* 8.0*  8.5*  HCT 34.2* 30.7* 24.4*   < >  --  20.5* 27.0* 24.2* 22.0* 23.4*  25.0*  MCV 111.8 repeated to verify* 120.4* 115.6*  --   --  115.8*  --   --   --  104.0*  PLT 102.0* 93* 73*  --  52* 46*  --   --   --  44*   < > = values in this interval not displayed.    Basic Metabolic Panel: Recent Labs  Lab 09/03/24 1320 09/04/24 0415 09/04/24 0523 09/04/24 1328 09/04/24 1355 09/04/24 1836 09/04/24 1952 09/05/24 0320  NA 130* 124*   < >  130* 128* 127* 124* 123*  124*  K 3.7 4.1   < > 4.2 3.9 4.2 4.0 3.8  3.8  CL 98 95*  --  91*  --  87*  --  85*  CO2 12* 16*  --  19*  --  15*  --  19*  GLUCOSE 51* 134*  --  92  --  137*  --  250*  BUN 20 22*  --  21*  --  23*  --  24*  CREATININE 2.19* 2.43*  --  2.60*  --  2.61*  --  2.81*  CALCIUM  7.6* 7.3*  --  10.5*  --  7.8*  --  8.0*  MG  --  1.3*  --  2.9*  --   --   --  2.6*  PHOS  --  7.4*  --   --   --   --   --  7.4*   < > = values in this interval not displayed.   GFR: Estimated Creatinine Clearance: 20.3 mL/min (A) (by C-G formula based on SCr of 2.81 mg/dL (H)). Recent Labs  Lab 09/03/24 1320 09/03/24 2130 09/04/24 0039 09/04/24 0415 09/04/24 1130 09/04/24 1328 09/04/24 1347 09/04/24 1836 09/05/24 0320  PROCALCITON  --  30.61  --   --   --   --   --   --   --   WBC 3.3*  --   --  5.8  --  6.4  --   --  12.0*  LATICACIDVEN  --  6.5*   < > 5.8* 6.1*  --  >9.0* >9.0* 9.0*   < > = values in this interval not displayed.    Liver Function Tests: Recent Labs  Lab 08/29/24 1400 09/03/24 1320 09/04/24 0415 09/04/24 1328 09/05/24 0320  AST 142* 178* 126* 102* 100*  ALT 69* 67* 55* 39 38  ALKPHOS 156* 91 57 34* 30*  BILITOT 9.8* 9.8* 10.6* 8.5* 9.8*  PROT 6.5 5.4* 6.1* 5.4* 5.6*  ALBUMIN  2.8* 1.8* 3.0* 3.1* 3.5   Recent Labs  Lab 09/03/24 1320  LIPASE 37   Recent Labs   Lab 09/03/24 1320  AMMONIA 33    ABG    Component Value Date/Time   PHART 7.359 09/05/2024 0320   PCO2ART 36.5 09/05/2024 0320   PO2ART 83 09/05/2024 0320   HCO3 20.9 09/05/2024 0320   TCO2 22 09/05/2024 0320   ACIDBASEDEF 4.0 (H) 09/05/2024 0320   O2SAT 96 09/05/2024 0320     Coagulation Profile: Recent Labs  Lab 09/03/24 2130 09/04/24 0415 09/04/24 1130 09/04/24 1328 09/04/24 1836  INR 3.9* 3.0* 4.4* 5.6* 5.4*    Cardiac Enzymes: Recent Labs  Lab 09/04/24 1836  CKTOTAL 318*    HbA1C: No results found for: HGBA1C  CBG: Recent Labs  Lab 09/04/24 1925 09/04/24 1954 09/04/24 2340 09/05/24 0142 09/05/24 0319  GLUCAP 47* 285* 195* 245* 252*    Past Medical History:  She,  has a past medical history of Hypothyroidism, Thyroid  disease, and Von Willebrand disease (HCC).   Surgical History:   Past Surgical History:  Procedure Laterality Date   CYST REMOVAL NECK  1995     Social History:   reports that she quit smoking about 34 years ago. Her smoking use included cigarettes. She started smoking about 44 years ago. She has a 2 pack-year smoking history. She does not have any smokeless tobacco history on file. She reports that she does not currently use alcohol. She reports  that she does not use drugs.   Family History:  Her family history includes Asthma in her mother; Eczema in her mother; Heart Problems in her father.   Allergies Allergies  Allergen Reactions   Levofloxacin Other (See Comments)   Sulfonamide Derivatives Palpitations     Home Medications  Prior to Admission medications   Medication Sig Start Date End Date Taking? Authorizing Provider  folic acid  (FOLVITE ) 1 MG tablet Take 1 tablet (1 mg total) by mouth daily. 08/16/24  Yes Dennise Lavada POUR, MD  furosemide  (LASIX ) 20 MG tablet Take 1 tablet (20 mg total) by mouth daily. 08/30/24  Yes Craig Palma R, PA-C  lactulose , encephalopathy, (ENULOSE ) 10 GM/15ML SOLN Take 15 mLs (10 g  total) by mouth 2 (two) times daily. 08/21/24  Yes Federico Rosario BROCKS, MD  levothyroxine  (SYNTHROID ) 88 MCG tablet Take 1 tablet (88 mcg total) by mouth daily before breakfast. 08/15/24  Yes Singh, Prashant K, MD  midodrine  (PROAMATINE ) 5 MG tablet Take 1 tablet (5 mg total) by mouth 2 (two) times daily with a meal. 08/30/24  Yes Craig Palma SAUNDERS, PA-C  propranolol  (INDERAL ) 10 MG tablet Take 1 tablet (10 mg total) by mouth 2 (two) times daily. 08/30/24  Yes Craig Palma R, PA-C  spironolactone  (ALDACTONE ) 50 MG tablet Take 1 tablet (50 mg total) by mouth daily. 08/30/24  Yes Craig Palma R, PA-C  thiamine  (VITAMIN B1) 100 MG tablet Take 1 tablet (100 mg total) by mouth daily. 08/16/24  Yes Singh, Prashant K, MD  potassium chloride  SA (KLOR-CON  M) 20 MEQ tablet Take 1 tablet (20 mEq total) by mouth daily for 2 days. 08/21/24 08/31/24  Federico Rosario BROCKS, MD     Critical care time: 90 minutes     Mohmed Farver Toma, MD PGY-2, ICU resident 09/05/24, 7:15 AM

## 2024-09-05 NOTE — Consult Note (Signed)
 [facility] Renal Consultation Note  Requesting MD: Dorethia Cave, MD Indication for Consultation:  AKI   Chief complaint: leg swelling and chills  HPI:  Rachel Lucas is a 63 y.o. female with a history of hypothyroidism and recent diagnosis of alcoholic cirrhosis who presented to the hospital with chills and leg swelling.  She also left lower extremity erythema.  She was diagnosed with cellulitis.  She was also found to have otitis media with moderate effusion.  Unfortunately her course was complicated by a PEA arrest on 9/16.  She did achieve ROSC - per charting had 6-7 minutes of CPR.  She is currently intubated and on norepinephrine  9 mcg/min, epinephrine  at 6 mcg/min, and vasopressin  at 0.04 units/min.  Course has also been complicated by ARDS, DIC as well as AKI.  Nephrology is consulted for assistance with management of AKI.  Pulmonary critical care is concerned that she is nearing the need for CRRT.  She had 50 mL UOP over 9/16 charted.  She has been on a bicarb gtt at 125 ml/hr.  Note that IR was consulted for a possible paracentesis - they did not feel she was stable for same per their note.  She has been on vanc and zosyn  and pharmacy is consulted for dosing of the same.  Team has a right femoral central line and art line.  They had attempted a left internal jugular central line earlier and this was not able to be placed.   I spoke with her husband at bedside and later her husband, her sister, brother-in-law, and niece in the family room in the ICU.  We discussed the risks/benefits/indications for CRRT.  At this time, they would like to talk as a family to decide how to proceed.    Creatinine, Ser  Date/Time Value Ref Range Status  09/05/2024 03:20 AM 2.81 (H) 0.44 - 1.00 mg/dL Final  90/83/7974 93:63 PM 2.61 (H) 0.44 - 1.00 mg/dL Final  90/83/7974 98:71 PM 2.60 (H) 0.44 - 1.00 mg/dL Final  90/83/7974 95:84 AM 2.43 (H) 0.44 - 1.00 mg/dL Final  90/84/7974 98:79 PM  2.19 (H) 0.44 - 1.00 mg/dL Final  90/89/7974 97:99 PM 0.69 0.40 - 1.20 mg/dL Final  90/97/7974 89:42 AM 0.60 0.40 - 1.20 mg/dL Final  91/72/7974 94:97 AM 0.63 0.44 - 1.00 mg/dL Final  91/73/7974 94:72 AM 0.67 0.44 - 1.00 mg/dL Final  91/74/7974 94:42 AM 0.65 0.44 - 1.00 mg/dL Final  91/75/7974 94:51 AM 0.81 0.44 - 1.00 mg/dL Final  91/76/7974 90:56 AM 0.80 0.44 - 1.00 mg/dL Final  91/77/7974 94:54 AM 0.68 0.44 - 1.00 mg/dL Final  91/78/7974 94:81 AM 0.76 0.44 - 1.00 mg/dL Final  91/79/7974 90:81 PM 0.66 0.44 - 1.00 mg/dL Final  91/79/7974 98:74 PM 0.57 0.44 - 1.00 mg/dL Final    Comment:    ICTERUS AT THIS LEVEL MAY AFFECT RESULT     PMHx:   Past Medical History:  Diagnosis Date   Hypothyroidism    Thyroid  disease         Past Surgical History:  Procedure Laterality Date   CYST REMOVAL NECK  1995    Family Hx:  Family History  Problem Relation Age of Onset   Asthma Mother    Eczema Mother    Heart Problems Father     Social History:  reports that she quit smoking about 34 years ago. Her smoking use included cigarettes. She started smoking about 44 years ago. She has a 2 pack-year smoking history.  She does not have any smokeless tobacco history on file. She reports that she does not currently use alcohol. She reports that she does not use drugs.  Allergies:  Allergies  Allergen Reactions   Levofloxacin Other (See Comments)   Sulfonamide Derivatives Palpitations    Medications: Prior to Admission medications   Medication Sig Start Date End Date Taking? Authorizing Provider  folic acid  (FOLVITE ) 1 MG tablet Take 1 tablet (1 mg total) by mouth daily. 08/16/24  Yes Dennise Lavada POUR, MD  furosemide  (LASIX ) 20 MG tablet Take 1 tablet (20 mg total) by mouth daily. 08/30/24  Yes Craig Palma R, PA-C  lactulose , encephalopathy, (ENULOSE ) 10 GM/15ML SOLN Take 15 mLs (10 g total) by mouth 2 (two) times daily. 08/21/24  Yes Federico Rosario BROCKS, MD  levothyroxine  (SYNTHROID ) 88 MCG  tablet Take 1 tablet (88 mcg total) by mouth daily before breakfast. 08/15/24  Yes Singh, Prashant K, MD  midodrine  (PROAMATINE ) 5 MG tablet Take 1 tablet (5 mg total) by mouth 2 (two) times daily with a meal. 08/30/24  Yes Craig Palma R, PA-C  propranolol  (INDERAL ) 10 MG tablet Take 1 tablet (10 mg total) by mouth 2 (two) times daily. 08/30/24  Yes Craig Palma R, PA-C  spironolactone  (ALDACTONE ) 50 MG tablet Take 1 tablet (50 mg total) by mouth daily. 08/30/24  Yes Craig Palma SAUNDERS, PA-C  thiamine  (VITAMIN B1) 100 MG tablet Take 1 tablet (100 mg total) by mouth daily. 08/16/24  Yes Singh, Prashant K, MD  potassium chloride  SA (KLOR-CON  M) 20 MEQ tablet Take 1 tablet (20 mEq total) by mouth daily for 2 days. 08/21/24 08/31/24  Federico Rosario BROCKS, MD   I have reviewed the patient's current and reported prior to admission medications.   Labs:     Latest Ref Rng & Units 09/05/2024    3:20 AM 09/04/2024    7:52 PM 09/04/2024    6:36 PM  BMP  Glucose 70 - 99 mg/dL 749   862   BUN 6 - 20 mg/dL 24   23   Creatinine 9.55 - 1.00 mg/dL 7.18   7.38   Sodium 864 - 145 mmol/L 135 - 145 mmol/L 123    124  124  127   Potassium 3.5 - 5.1 mmol/L 3.5 - 5.1 mmol/L 3.8    3.8  4.0  4.2   Chloride 98 - 111 mmol/L 85   87   CO2 22 - 32 mmol/L 19   15   Calcium  8.9 - 10.3 mg/dL 8.0   7.8     Urinalysis    Component Value Date/Time   COLORURINE AMBER (A) 09/04/2024 0619   APPEARANCEUR CLOUDY (A) 09/04/2024 0619   LABSPEC 1.014 09/04/2024 0619   PHURINE 5.0 09/04/2024 0619   GLUCOSEU NEGATIVE 09/04/2024 0619   HGBUR NEGATIVE 09/04/2024 0619   BILIRUBINUR SMALL (A) 09/04/2024 0619   KETONESUR NEGATIVE 09/04/2024 0619   PROTEINUR 100 (A) 09/04/2024 0619   NITRITE NEGATIVE 09/04/2024 0619   LEUKOCYTESUR NEGATIVE 09/04/2024 0619     ROS: Unable to obtain secondary to intubated and sedated    Physical Exam: Vitals:   09/05/24 1107 09/05/24 1115  BP:    Pulse: 70   Resp:  (!) 24  Temp:  99 F  (37.2 C)  SpO2: 93%      General: adult female chronically ill-appearing  HEENT: NCAT; some blood noted in her mouth Eyes: sclera are icteric Neck: supple trachea midline  Heart: S1S2 no rub Lungs:  coarse mechanical breath sounds  Abdomen: tight and distended; on sedation/not able to assess tenderness Extremities: bilateral lower extremities with 2+ edema; no clubbing  Skin: left leg is mottled; stigmata of chronic liver disease   Neuro: continuous sedation currently running GU: foley catheter in place    Assessment/Plan:   # AKI  - Secondary to ischemic ATN from her arrest.  Given that she is post-arrest and anuric and in shock, this is unfortunately expected to worsen.  Baseline Cr less than 1.  Renal US  with bilateral increased echogenicity ------------- - I have recommended CRRT.  I have discussed risks/benefits/indications for renal replacement therapy with her husband.  He is speaking with her two sisters and the family is deciding what to do.  I do feel that this is the next step if pursuing aggressive care and have shared this with them   - I have spoken with her primary team and they are reaching back out to her family as well.  This has understandably been difficult for them and they want to make the right choice for her    # Septic shock  - Pressors per primary team  - antibiotics per primary team and pharmacy is consulted for dosing  - note that with AKI her GFR is likely over-estimated  # PEA arrest  - supportive post-arrest care   # Hyponatremia  - I have recommended CRRT   - currently on bicarb gtt which we would stop if CRRT initiated   # ARDS  - Vent per critical care  - I recommend to optimize volume status with CRRT as above   # Metabolic acidosis - I have recommended CRRT - If/once on CRRT we can stop bicarb gtt   - Treat shock and underlying etiology as above  # Cirrhosis - decompensated  - she is on albumin  and levophed   - IR deferred bedside  paracentesis earlier today   # Hyperphosphatemia  - Recommending CRRT    # Macrocytic anemia  - Concern for DIC - PRBC's per primary team  - With past heavy alcohol use there is concern for nutrient deficiency, as well  - markedly elevated ferritin on check in August and sats not calculated.  Folate low at 3.2.  she is on folic acid  and thiamine  as well.  B12 ok   # Thrombocytopenia  - note that she has had oozing from some IV sites per nursing - treating DIC per critical care team    Thank you for the consult.  Please do not hesitate to contact me with any questions regarding our patient.    The patient is critically ill with AKI, ARDS, decompensated cirrhosis, and metabolic acidosis and which includes my role to primarily manage AKI and metabolic acidosis.  This requires high complexity decision making.  Total critical care time: 35 minutes     Critical care time was exclusive of treating other patients.   Critical care was necessary to treat or prevent imminent or life-threatening deterioration.   Critical care was time spent personally by me on the following activities:   development of treatment plan with patient and/or surrogate as well as nursing,   discussions with other provider evaluation of patient's response to treatment  examination of patient  obtaining history from patient or surrogate  Discussion of goals of care with the patient's family  ordering and performing treatments and interventions  ordering and review of laboratory studies  ordering and review of radiographic studies   Rachel Lucas  Rachel Lucas 09/05/2024, 2:07 PM

## 2024-09-05 NOTE — IPAL (Signed)
  Interdisciplinary Goals of Care Family Meeting   Date carried out: 09/05/2024  Location of the meeting: Bedside  Member's involved: Physician, Family Member or next of kin, and Other: husband, sister and brothe rin law  Durable Power of Attorney or Environmental health practitioner: hsuband    Discussion: We discussed goals of care for Rachel Lucas .  Call from Dr Jerrye that patient meets criteria for CRRT. D/w husband and family  - baseloine: was fitness instruction fully vibrant and active till few months ago. No major medical issues til current dx of cirrhosis etc., Vibrant and would want to live and return to health  Explained that survivial chances are small and would lielkly involved a long hospital course and even LTACT, rehab and could be weeks t0 months before return to home  Explained   - meets criteria for CRRT and its purpose  -= explained risks for HD cath placeent  but need to reduce with fFP platements  Code status:   Code Status: Full Code   Disposition: Continue current acute care + place HD cath + attempt CRRT  Time spent for the meeting: 20 min    Rachel Cave, MD  09/05/2024, 2:17 PM

## 2024-09-05 NOTE — Progress Notes (Signed)
 Request to IR for possible bedside paracentesis.  Patient INR > 5, plt 44, hgb 8.0, BP 85/73, temp 96.1. Abdominal US  yesterday shows small amount of perihepatic ascites which is likely amenable to paracentesis however given significant coagulopathy, hypothermia and hypotension patient is not currently a candidate for paracentesis.  No procedure planned today, IR will assess labs/vital signs tomorrow to determine if we are able to proceed.  Please call/Epic chat with questions.  Clotilda Hesselbach, PA-C

## 2024-09-05 NOTE — Progress Notes (Signed)
 PHARMACY - PHYSICIAN COMMUNICATION CRITICAL VALUE ALERT - BLOOD CULTURE IDENTIFICATION (BCID)  Rachel Lucas is an 63 y.o. female who presented to [facility] on 09/03/2024 with a chief complaint of hypotension, intermittent dark stools, and little PO intake.   Assessment:  GNR in 1/4 bottles (anaerobic), BCID = nothing detected (include suspected source if known)  Current infectious workup is for sepsis possibly 2/2 lower extremity cellulitis. Upon chart review, vancomycin  was added today for cellulitis for Streptococcus coverage.   Name of physician (or Provider) Contacted: Dr. Dorethia Cave  Current antibiotics: Zosyn , vancomycin   Changes to prescribed antibiotics recommended:  - Continue Zosyn  for now until GNR is identified  - Discontinue vancomycin  given low concern for MRSA (Zosyn  monotherapy will cover for Streptococcus cellulitis concerns) - Provider agreed with recommendation  Results for orders placed or performed during the hospital encounter of 09/03/24  Blood Culture ID Panel (Reflexed) (Collected: 09/03/2024  9:30 PM)  Result Value Ref Range   Enterococcus faecalis NOT DETECTED NOT DETECTED   Enterococcus Faecium NOT DETECTED NOT DETECTED   Listeria monocytogenes NOT DETECTED NOT DETECTED   Staphylococcus species NOT DETECTED NOT DETECTED   Staphylococcus aureus (BCID) NOT DETECTED NOT DETECTED   Staphylococcus epidermidis NOT DETECTED NOT DETECTED   Staphylococcus lugdunensis NOT DETECTED NOT DETECTED   Streptococcus species NOT DETECTED NOT DETECTED   Streptococcus agalactiae NOT DETECTED NOT DETECTED   Streptococcus pneumoniae NOT DETECTED NOT DETECTED   Streptococcus pyogenes NOT DETECTED NOT DETECTED   A.calcoaceticus-baumannii NOT DETECTED NOT DETECTED   Bacteroides fragilis NOT DETECTED NOT DETECTED   Enterobacterales NOT DETECTED NOT DETECTED   Enterobacter cloacae complex NOT DETECTED NOT DETECTED   Escherichia coli NOT DETECTED NOT DETECTED    Klebsiella aerogenes NOT DETECTED NOT DETECTED   Klebsiella oxytoca NOT DETECTED NOT DETECTED   Klebsiella pneumoniae NOT DETECTED NOT DETECTED   Proteus species NOT DETECTED NOT DETECTED   Salmonella species NOT DETECTED NOT DETECTED   Serratia marcescens NOT DETECTED NOT DETECTED   Haemophilus influenzae NOT DETECTED NOT DETECTED   Neisseria meningitidis NOT DETECTED NOT DETECTED   Pseudomonas aeruginosa NOT DETECTED NOT DETECTED   Stenotrophomonas maltophilia NOT DETECTED NOT DETECTED   Candida albicans NOT DETECTED NOT DETECTED   Candida auris NOT DETECTED NOT DETECTED   Candida glabrata NOT DETECTED NOT DETECTED   Candida krusei NOT DETECTED NOT DETECTED   Candida parapsilosis NOT DETECTED NOT DETECTED   Candida tropicalis NOT DETECTED NOT DETECTED   Cryptococcus neoformans/gattii NOT DETECTED NOT DETECTED    Feliciano Close, PharmD PGY2 Infectious Diseases Pharmacy Resident  09/05/2024 3:48 PM

## 2024-09-05 NOTE — Procedures (Signed)
 Central Venous Catheter Insertion Procedure Note  SERA HITSMAN  [id number]  06/29/1961  Date:09/05/24  Time:6:08 PM   Provider Performing:Avyanna Spada [name] Ilah   Procedure: Insertion of Non-tunneled Central Venous Catheter(36556)with US  guidance (23062)    Indication(s) Hemodialysis  Consent Risks of the procedure as well as the alternatives and risks of each were explained to the patient and/or caregiver.  Consent for the procedure was obtained and is signed in the bedside chart  Anesthesia Topical only with 1% lidocaine    Timeout Verified patient identification, verified procedure, site/side was marked, verified correct patient position, special equipment/implants available, medications/allergies/relevant history reviewed, required imaging and test results available.  Sterile Technique Maximal sterile technique including full sterile barrier drape, hand hygiene, sterile gown, sterile gloves, mask, hair covering, sterile ultrasound probe cover (if used).  Procedure Description Area of catheter insertion was cleaned with chlorhexidine  and draped in sterile fashion.   With real-time ultrasound guidance a HD catheter was placed into the right internal jugular vein.  Nonpulsatile blood flow and easy flushing noted in all ports.  The catheter was sutured in place and sterile dressing applied.    Complications/Tolerance None; patient tolerated the procedure well. Chest X-ray is ordered to verify placement for internal jugular or subclavian cannulation.  Chest x-ray is not ordered for femoral cannulation.  EBL Minimal  Specimen(s) None  Corean [name] Ilah, [place] [facility] 09/05/24 6:08 PM  Please see [email or web address] for pager details.  From 7A-7P if no response, please call [phone] After hours, please call ELink [phone]

## 2024-09-05 NOTE — Progress Notes (Signed)
 [facility] ROUNDING NOTE   Subjective: Patient's urine output has remained minimal, nephrology consulted and recommended CRRT.  Continues to require three pressors with significant lactate levels, but acidosis has improved.   Hgb stable after 1 unit pRBC yesterday.  No bloody stools or significant blood NG output.  She is ordered for 2 units platelets and FFP.  GNRs in blood cultures.  Urine culture negative.  CXR with diffuse bilateral hazy airspace disease, concerning for ARDS Paracentesis felt to be excessively risky for bleeding given patient's severe coagulopathy and thrombocytopenia.   Family discussion with PCCM and decision made to continue all aggressive measures  Objective: Vital signs in last 24 hours: Temp:  [95.2 F (35.1 C)-99.7 F (37.6 C)] 99.3 F (37.4 C) (09/17 1900) Pulse Rate:  [54-138] 70 (09/17 1900) Resp:  [17-24] 24 (09/17 1900) BP: (47-96)/(19-73) 47/25 (09/17 1600) SpO2:  [56 %-100 %] 100 % (09/17 1900) Arterial Line BP: (82-149)/(47-80) 134/70 (09/17 1900) FiO2 (%):  [50 %-60 %] 60 % (09/17 1945) Weight:  [72.4 kg] 72.4 kg (09/17 0500) Last BM Date : 09/04/24 General:  Ill appearing, intubated Lungs:  Rhonchorus breath sounds Heart:  RRR, no m/r/g Abdomen:  Soft, NT, ND, +BS Ext:  Mottled    Intake/Output from previous day: 09/16 0701 - 09/17 0700 In: 7594.1 [I.V.:4852; Blood:346; IV Piggyback:2396.1] Out: 400 [Urine:50; Emesis/NG output:350] Intake/Output this shift: Total I/O In: 286.5 [I.V.:225.5; NG/GT:60; IV Piggyback:1] Out: -1    Lab Results: Recent Labs    09/04/24 0415 09/04/24 0523 09/04/24 1130 09/04/24 1328 09/04/24 1355 09/04/24 1838 09/04/24 1952 09/05/24 0320  WBC 5.8  --   --  6.4  --   --   --  12.0*  HGB 8.3*   < >  --  6.8*   < > 8.1* 7.5* 8.0*  8.5*  PLT 73*  --  52* 46*  --   --   --  44*  MCV 115.6*  --   --  115.8*  --   --   --  104.0*   < > = values in this interval not displayed.    BMET Recent Labs    09/04/24 1328 09/04/24 1355 09/04/24 1836 09/04/24 1952 09/05/24 0320  NA 130*   < > 127* 124* 123*  124*  K 4.2   < > 4.2 4.0 3.8  3.8  CL 91*  --  87*  --  85*  CO2 19*  --  15*  --  19*  GLUCOSE 92  --  137*  --  250*  BUN 21*  --  23*  --  24*  CREATININE 2.60*  --  2.61*  --  2.81*  CALCIUM  10.5*  --  7.8*  --  8.0*   < > = values in this interval not displayed.   LFT Recent Labs    09/04/24 0415 09/04/24 1328 09/05/24 0320  PROT 6.1* 5.4* 5.6*  ALBUMIN  3.0* 3.1* 3.5  AST 126* 102* 100*  ALT 55* 39 38  ALKPHOS 57 34* 30*  BILITOT 10.6* 8.5* 9.8*  BILIDIR 4.9*  --   --   IBILI 5.7*  --   --    PT/INR Recent Labs    09/04/24 1328 09/04/24 1836  INR 5.6* 5.4*      Imaging/Other results: DG CHEST PORT 1 VIEW Result Date: 09/05/2024 CLINICAL DATA:  [id number] Encounter for central line placement [id number] EXAM: PORTABLE CHEST - 1 VIEW COMPARISON:  09/05/2024 7:28 a.m. FINDINGS: Similarly positioned  endotracheal tube terminating in the mid trachea. Right IJ approach central venous catheter has been placed in the interim, terminating in the lower SVC. Overlying defibrillator pads again noted. Esophagogastric tube courses below the diaphragm with the distal tip not included in the field of view. Unchanged diffuse hazy airspace disease throughout both lungs. Likely layering bilateral pleural effusions are also present. Mild cardiomegaly. No pneumothorax. IMPRESSION: Interval placement of a right IJ approach central venous catheter, which terminates in the lower SVC. No pneumothorax. Otherwise, little significant interval change to the lungs. Electronically Signed   By: Rogelia Myers M.D.   On: 09/05/2024 16:32   DG CHEST PORT 1 VIEW Result Date: 09/05/2024 CLINICAL DATA:  63 year old female with respiratory failure. Intubated. EXAM: PORTABLE CHEST 1 VIEW COMPARISON:  Portable chest yesterday and earlier. FINDINGS: Portable AP semi upright view at 0728  hours. Endotracheal tube tip at the level the clavicles. Enteric tube courses to the abdomen, tip not included. Pacer/resuscitation pads remain in place. Stable lung volumes and visible mediastinal contours with ongoing diffuse pulmonary opacity, veiling and confluent bibasilar opacity which obscures the diaphragm. Vague but confluent symmetric lung opacity elsewhere. Marked progression compared to portable chest on 09/03/2024, ventilation not significantly changed from yesterday. No pneumothorax. Paucity of bowel gas. Stable visualized osseous structures. IMPRESSION: 1. Stable lines and tubes. 2. Extensive bilateral Pneumonia versus Edema versus ARDS without significant change from yesterday. Probable bilateral pleural effusions. Electronically Signed   By: VEAR Hurst M.D.   On: 09/05/2024 07:38   DG Abd Portable 1V Result Date: 09/05/2024 CLINICAL DATA:  Check gastric catheter placement EXAM: PORTABLE ABDOMEN - 1 VIEW COMPARISON:  Film from the previous day. FINDINGS: Gastric catheter is been advanced deeper into the stomach. Free air seen. IMPRESSION: Gastric catheter within the stomach. Electronically Signed   By: Oneil Devonshire M.D.   On: 09/05/2024 01:28   DG Abd 1 View Result Date: 09/04/2024 CLINICAL DATA:  Check gastric catheter placement EXAM: ABDOMEN - 1 VIEW COMPARISON:  None Available. FINDINGS: Gastric catheter is noted within the stomach. The proximal side port is felt to lie within the distal esophagus. This should be advanced several cm deeper and stomach. IMPRESSION: Gastric catheter as described. This should be advanced deeper into the stomach. Electronically Signed   By: Oneil Devonshire M.D.   On: 09/04/2024 22:16   DG CHEST PORT 1 VIEW Result Date: 09/04/2024 EXAM: 1 VIEW XRAY OF THE CHEST 09/04/2024 05:35:18 PM COMPARISON: 09/04/2024 CLINICAL HISTORY: Endotracheally intubated; MD approved xrays at bedside. FINDINGS: LINES, TUBES AND DEVICES: Endotracheal tube in place with tip 4.3 cm above the  carina. Enteric tube with tip and sidehole overlying the lower esophagus, recommend advancement. External cardiac pacing pads on lower chest. LUNGS AND PLEURA: Hazy airspace opacities throughout the lungs are increased, likely representing multifocal pneumonia versus pulmonary edema. Bilateral pleural effusions. HEART AND MEDIASTINUM: No acute abnormality of the cardiac and mediastinal silhouettes. BONES AND SOFT TISSUES: No acute osseous abnormality. IMPRESSION: 1. Hazy airspace opacities throughout the lungs, likely representing multifocal pneumonia versus pulmonary edema. 2. Bilateral pleural effusions. 3. Enteric tube with tip and sidehole overlying the lower esophagus, recommend advancement. Electronically signed by: Donnice Mania MD 09/04/2024 07:31 PM EDT RP Workstation: [id number]   DG Abd 1 View Result Date: 09/04/2024 EXAM: 1 VIEW XRAY OF THE ABDOMEN 09/04/2024 05:35:18 PM COMPARISON: 09/04/2024 CLINICAL HISTORY: Endotracheally intubated; MD approved xrays at bedside. FINDINGS: LINES, TUBES AND DEVICES: Enteric tube in distal esophagus. Right femoral vascular catheter in  place. BOWEL: Paucity of gas throughout the abdomen. Gaseous distension of the stomach similar to prior study. SOFT TISSUES: No opaque urinary calculi. BONES: No acute osseous abnormality. IMPRESSION: 1. Gaseous distension of the stomach, similar to prior study. 2. Enteric tube with tip and side hole overlying the lower esophagus. Electronically signed by: Donnice Mania MD 09/04/2024 07:28 PM EDT RP Workstation: [id number]   EEG adult Result Date: 09/04/2024 Shelton Arlin KIDD, MD     09/04/2024  5:32 PM Patient Name: DARLY MASSI MRN: [id number] Epilepsy Attending: Arlin KIDD Shelton Referring Physician/Provider: Toma Matas, MD Date: 09/04/2024 Duration: 22.19 mins Patient history: 63yo F with ams getting eeg to evaluate for seizure Level of alertness: Awake AEDs during EEG study: None Technical aspects: This EEG study was done  with scalp electrodes positioned according to the 10-20 International system of electrode placement. Electrical activity was reviewed with band pass filter of 1-70Hz , sensitivity of 7 uV/mm, display speed of 43mm/sec with a 60Hz  notched filter applied as appropriate. EEG data were recorded continuously and digitally stored.  Video monitoring was available and reviewed as appropriate. Description: EEG showed continuous generalized 3 to 6 Hz theta-delta slowing. Hyperventilation and photic stimulation were not performed.   ABNORMALITY - Continuous slow, generalized IMPRESSION: This study is suggestive of moderate diffuse encephalopathy. No seizures or epileptiform discharges were seen throughout the recording. Priyanka O Yadav   VAS US  LOWER EXTREMITY VENOUS (DVT) Result Date: 09/04/2024  Lower Venous DVT Study Patient Name:  SAMANVI CUCCIA  Date of Exam:   09/04/2024 Medical Rec #: [id number]           Accession #:    [id number] Date of Birth: [birth year]           Patient Gender: F Patient Age:   41 years Exam Location:  [facility] Procedure:      VAS US  LOWER EXTREMITY VENOUS (DVT) Referring Phys: TORIBIO SHARPS --------------------------------------------------------------------------------  Indications: Edema.  Limitations: Poor ultrasound/tissue interface and line. Comparison Study: No previous exams Performing Technologist: Jody Hill RVT, RDMS  Examination Guidelines: A complete evaluation includes B-mode imaging, spectral Doppler, color Doppler, and power Doppler as needed of all accessible portions of each vessel. Bilateral testing is considered an integral part of a complete examination. Limited examinations for reoccurring indications may be performed as noted. The reflux portion of the exam is performed with the patient in reverse Trendelenburg.  +-----+---------------+---------+-----------+----------+--------------+ RIGHTCompressibilityPhasicitySpontaneityPropertiesThrombus Aging  +-----+---------------+---------+-----------+----------+--------------+ CFV                                               not visualized +-----+---------------+---------+-----------+----------+--------------+ CFV/SFJ not visualized due to central and arterial line placement.  +---------+---------------+---------+-----------+----------+--------------+ LEFT     CompressibilityPhasicitySpontaneityPropertiesThrombus Aging +---------+---------------+---------+-----------+----------+--------------+ CFV      Full           No       Yes                                 +---------+---------------+---------+-----------+----------+--------------+ SFJ      Full                                                        +---------+---------------+---------+-----------+----------+--------------+  FV Prox  Full           No       Yes                                 +---------+---------------+---------+-----------+----------+--------------+ FV Mid   Full           No       Yes                                 +---------+---------------+---------+-----------+----------+--------------+ FV DistalFull           No       Yes                                 +---------+---------------+---------+-----------+----------+--------------+ PFV      Full                                                        +---------+---------------+---------+-----------+----------+--------------+ POP      Full           No       Yes                                 +---------+---------------+---------+-----------+----------+--------------+ PTV      Full                                                        +---------+---------------+---------+-----------+----------+--------------+ PERO     Full                                                        +---------+---------------+---------+-----------+----------+--------------+ pulsatile doppler waveforms    Summary: LEFT: - There is no evidence  of deep vein thrombosis in the lower extremity.  - No cystic structure found in the popliteal fossa. Diffuse subcutaneous edema throughout lower extremity.  *See table(s) above for measurements and observations. Electronically signed by Penne Colorado MD on 09/04/2024 at 4:30:43 PM.    Final    DG Abd Portable 1V Result Date: 09/04/2024 EXAM: 1 VIEW XRAY OF THE ABDOMEN 09/04/2024 02:26:00 PM COMPARISON: CT abdomen and pelvis dated 08/08/2024. CLINICAL HISTORY: Encounter for feeding tube placement. Post intubation and feeding tube. FINDINGS: LINES, TUBES AND DEVICES: Enteric tube tip projects over the stomach. Side hole projects over the lower esophagus. Right inferior femoral approach venous catheter tip projects over the right L5 level. BOWEL: Gas filled mildly dilated stomach and a few loops of right-sided bowel loops are present. SOFT TISSUES: No opaque urinary calculi. BONES: No acute osseous abnormality. IMPRESSION: 1. Enteric tube tip projects over the stomach, with side hole projecting over the lower esophagus. Recommend advancing. Electronically signed by: Manford Cummins MD 09/04/2024 03:12 PM EDT RP Workstation:  [id number]   DG CHEST PORT 1 VIEW Result Date: 09/04/2024 CLINICAL DATA:  Status post intubation EXAM: PORTABLE CHEST 1 VIEW COMPARISON:  September 04, 2024 abdominal radiograph and September 03, 2024 chest radiograph FINDINGS: Interval placement of endotracheal tube with tip at the level of the trachea. Recommend retraction 2-3 cm. Interval placement of enteric tube with tip outside the field of view with side port near the distal esophagus, better seen on same day abdominal radiograph. Recommend advancement. Partially imaged linear radiodensity projecting over the left hemithorax and mediastinum, possibly representing overlying lead or support device external to the patient. Low lung volumes with multifocal bilateral airspace opacities, greatest at the right lung base, possibly related to a pulmonary  edema and/or infection. Unchanged cardiomediastinal silhouette. No acute osseous findings. IMPRESSION: 1. Endotracheal tube with tip at the level of the carina. Recommend retraction 2-3 cm. 2. Enteric tube with tip better seen on prior same day abdominal radiograph but side port in the distal esophagus. Recommend advancement. 3. Additional overlying radiodensities, favored to represent external leads or support devices. 4. Multifocal pulmonary opacities, greatest at the right lung base, possibly representing multifocal pneumonia with possible underlying pulmonary edema. These results will be called to the ordering clinician or representative by the Radiologist Assistant, and communication documented in the PACS or [employer]. Electronically Signed   By: Michaeline Blanch M.D.   On: 09/04/2024 14:52   US  Abdomen Limited RUQ (LIVER/GB) Result Date: 09/04/2024 EXAM: Right Upper Quadrant Abdominal Ultrasound TECHNIQUE: Real-time ultrasonography of the right upper quadrant of the abdomen was performed. COMPARISON: CT abdomen and pelvis dated 08/08/2024. CLINICAL HISTORY: Cirrhosis (HCC) FINDINGS: LIVER: The liver demonstrates cirrhotic morphology with nodular hepatic contour. Portal vein is patent. Reversal of portal venous flow. BILIARY SYSTEM: No evidence of wall thickening. Moderate volume layering sludge. No cholelithiasis. Common bile duct measures 5 mm. OTHER: Moderate volume ascites. IMPRESSION: 1. Cirrhotic morphology with sequela of portal hypertension including moderate volume ascites and reversal of portal venous flow. 2. Gallbladder contains moderate volume layering sludge. Electronically signed by: Manford Cummins MD 09/04/2024 02:49 PM EDT RP Workstation: [id number]   ECHOCARDIOGRAM COMPLETE Result Date: 09/04/2024    ECHOCARDIOGRAM REPORT   Patient Name:   ALLENA PIETILA Date of Exam: 09/04/2024 Medical Rec #:  [id number]          Height:       63.0 in Accession #:    [id number]         Weight:        140.4 lb Date of Birth:  1961-05-05          BSA:          1.664 m Patient Age:    62 years           BP:           139/85 mmHg Patient Gender: F                  HR:           59 bpm. Exam Location:  Inpatient Procedure: 2D Echo, Color Doppler and Cardiac Doppler (Both Spectral and Color            Flow Doppler were utilized during procedure). Indications:    Cardiac Arrest i46.9  History:        Patient has no prior history of Echocardiogram examinations.  Sonographer:    Damien Senior RDCS Referring Phys: [phone] [facility]  Sonographer Comments: Zoll pads still in  place post arrest, no parasternals IMPRESSIONS  1. Left ventricular ejection fraction, by estimation, is 35 to 40%. The left ventricle has moderately decreased function. Paradoxical/abnormal septal motion after cardiac arrest possibly secondary to junctional escape. Left ventricular diastolic parameters are indeterminate.  2. Right ventricular systolic function is low normal. The right ventricular size is normal. There is normal pulmonary artery systolic pressure. The estimated right ventricular systolic pressure is 33.5 mmHg.  3. The mitral valve is grossly normal. Severe mitral valve regurgitation.  4. Tricuspid valve regurgitation is moderate to severe.  5. The aortic valve is grossly normal. Aortic valve regurgitation is not visualized.  6. The inferior vena cava is dilated in size with <50% respiratory variability, suggesting right atrial pressure of 15 mmHg. FINDINGS  Left Ventricle: Left ventricular ejection fraction, by estimation, is 35 to 40%. The left ventricle has moderately decreased function. The left ventricle demonstrates regional wall motion abnormalities. The left ventricular internal cavity size was normal in size. Suboptimal image quality limits for assessment of left ventricular hypertrophy. Left ventricular diastolic parameters are indeterminate. Right Ventricle: The right ventricular size is normal. No increase in right  ventricular wall thickness. Right ventricular systolic function is low normal. There is normal pulmonary artery systolic pressure. The tricuspid regurgitant velocity is 2.15 m/s,  and with an assumed right atrial pressure of 15 mmHg, the estimated right ventricular systolic pressure is 33.5 mmHg. Left Atrium: Left atrial size was normal in size. Right Atrium: Right atrial size was normal in size. Pericardium: There is no evidence of pericardial effusion. Mitral Valve: The mitral valve is grossly normal. Severe mitral valve regurgitation. Tricuspid Valve: The tricuspid valve is grossly normal. Tricuspid valve regurgitation is moderate to severe. Aortic Valve: The aortic valve is grossly normal. Aortic valve regurgitation is not visualized. Pulmonic Valve: The pulmonic valve was not assessed. Pulmonic valve regurgitation is not visualized. Aorta: The ascending aorta was not well visualized. Venous: The inferior vena cava is dilated in size with less than 50% respiratory variability, suggesting right atrial pressure of 15 mmHg. IAS/Shunts: No atrial level shunt detected by color flow Doppler. Additional Comments: Severe ascites is present.  LEFT VENTRICLE PLAX 2D LVOT diam:     2.10 cm LV SV:         29 LV SV Index:   18 LVOT Area:     3.46 cm  LV Volumes (MOD) LV vol d, MOD A4C: 85.2 ml LV vol s, MOD A4C: 50.6 ml LV SV MOD A4C:     85.2 ml RIGHT VENTRICLE RV S prime:     11.60 cm/s TAPSE (M-mode): 2.0 cm LEFT ATRIUM             Index        RIGHT ATRIUM           Index LA Vol (A2C):   47.3 ml 28.43 ml/m  RA Area:     17.10 cm LA Vol (A4C):   42.0 ml 25.24 ml/m  RA Volume:   44.40 ml  26.69 ml/m LA Biplane Vol: 48.0 ml 28.85 ml/m  AORTIC VALVE LVOT Vmax:   57.90 cm/s LVOT Vmean:  43.350 cm/s LVOT VTI:    0.085 m TRICUSPID VALVE TR Peak grad:   18.5 mmHg TR Vmax:        215.00 cm/s  SHUNTS Systemic VTI:  0.08 m Systemic Diam: 2.10 cm Aditya Sabharwal Electronically signed by Ria Commander Signature Date/Time:  09/04/2024/2:27:44 PM    Final    DG  Chest 1 View Result Date: 09/03/2024 CLINICAL DATA:  Septic shock EXAM: PORTABLE CHEST 1 VIEW COMPARISON:  Film from earlier in the same day. FINDINGS: Cardiac shadow is stable. The lungs are well aerated bilaterally. Mild basilar atelectasis is seen. No focal confluent infiltrate is noted. Small right effusion is again seen. No bony abnormality is noted. IMPRESSION: Mild basilar atelectasis and right effusion stable from the prior study. Electronically Signed   By: Oneil Devonshire M.D.   On: 09/03/2024 21:52      Assessment and Plan:  63 year old female with decompensated alcohol-induced cirrhosis admitted with severe sepsis and progressive multi-organ failure with PEA arrest on Sept 16.    Septic shock with DIC - On three pressors currently - On zosyn  3.375 g q8, vancomycin  d/c'd - Bicarb/D5 gtt - Lactate remains significantly elevate around 9, but pH has improved - GNRs in blood making cellulitis less likely cause of infection.  SBP possible source, but paracentesis felt to pose excessive risk; if SBP, current management would not differ - Management per PCCM - Started hydrocortisone  100 mg IV q8  Decompensated cirrhosis - Severe liver dysfunction with underlying portal hypertension impairing her ability to fight infection and contributing to multiorgan failure, encephalopathy, coagulopathy and lactic acidosis - Patient at very risk for GI bleeding given cirrhosis and DIC with severe coagulopathy and thrombocytopenia - Continue PPI BID, octreotide  - No role for prednisolone  for alcoholic hepatitis; patient had already been treated and had high Lille score prior to admission, prompting cessation of prednisolone   Acute renal failure, secondary to septic shock; likely HRS but cannot make this diagnosis in setting of septic shock - Nephrology  following, recommending CRRT - Continue scheduled albumin  25 g q6hr for volume expansion - Midodrine  10 mg TID -  Octreotide  100 mcg q8 hr  Encephalopathy, likely metabolic +/- hepatic - EEG with no seizure activity - Continue lactulose  30 g TID and rifaximin  550 mg BID  Respiratory failure, suspect ARDS - Vent settings per PCCM   GI will continue to follow with you  Glendia FORBES Holt, MD  09/05/2024, 8:11 PM [facility]

## 2024-09-05 NOTE — Progress Notes (Signed)
 Initial Nutrition Assessment  DOCUMENTATION CODES:  Not applicable  INTERVENTION:  MD amenable to trickle feeds today: Start Vital 1.5 at 51ml/hr via OGT   Once tolerance established and able to advance, recommend: Advancing Vital 1.5 by 10ml q12h to goal rate of 74ml/hr ( per day) 60ml ProSource TF20 BID Provides 1600 kcal, 105g protein, 733ml free water  daily  Monitor magnesium , potassium, and phosphorus daily for at least 3 days, MD to replete as needed, as pt is at risk for refeeding syndrome given inadequate oral intake >7 days.  - Continue thiamine  as ordered  Once tube feeds able to advance to goal rate, recommend adding Renal MVI with minerals daily  NUTRITION DIAGNOSIS:  Inadequate oral intake related to acute illness, poor appetite, early satiety as evidenced by NPO status.  GOAL:  Patient will meet greater than or equal to 90% of their needs  MONITOR:  Vent status, Labs, Weight trends, TF tolerance, I & O's, Skin  REASON FOR ASSESSMENT:  Ventilator, Consult Other (Comment) (CRRT protocol)  ASSESSMENT:  Pt admitted with c/o LLE swelling and redness. PMH significant for new diagnosis of alcoholic cirrhosis with septic shock though 2/2 LLE cellulitis, persistent otitis media, recent admission for klebsiella bacteremia and PNA.   9/15: admitted 9/16: PEA arrest for 6 minutes with ROSC 9/17: repeat chest xray with ARDS; CRRT initiated for acidosis, shock, worsening AKI and volume status  Patient is currently intubated on ventilator support MV: 10 L/min Temp (24hrs), Avg:97.8 F (36.6 C), Min:95.7 F (35.4 C), Max:99.3 F (37.4 C) MAP (a-line): 61-70mmHg x12 hours; last MAP 83 mmHg (0901)  IR deferred paracentesis d/t coagulopathy and thrombocytopenia.   Spoke with RN who reports that OGT is back to suction given concern for increase distension aside from cirrhosis. Reports that pt put out ~824ml.   RN reports that pt did have a BM this morning so  lactulose  was held.   Spoke with pt's husband at bedside. He reports that since early Spring, is when he began to notice that she was beginning to eat less. D/t the cirrhosis, she has c/o early satiety and has only been able to eat small quantities throughout the day. Her most recent intake includes blueberry yogurt, eggs and peanut butter toast.   Pt used to be a Marketing executive and would be able to perform aerobic exercises for 2 hours in a row but has progressively declined in energy. He reports that she had begun to sleep more and take more naps throughout the day/nighttime.   First measured weight (9/16): 63.7 kg Current weight: 76.6 kg   + edema: moderate pitting generalized, deep pitting BUE, BLE  Pt's husband reports that her usual body weight was about 106 lbs. During her last doctor's appointment, her weight was about 120 lbs d/t fluid retention and cirrhosis.   I/O's: UOP: x24 hours OGT to suction: x24 hours + this morning CRRT UF: x24 hours  Drains/lines: Right femoral a line Right femoral CVC Right internal jugular triple lumen HD cath OGT (within the stomach)  Medications: colace BID, folvite , solu-cortef , lactulose  TID, octreotide  q8h, miralax  daily, thiamine  Drips: Albumin  Epi @ 2mcg/min Levo @ 7 mcg/min Vaso @ 0.04units/min  Labs:  Sodium 130/131 Cr 2.01/2.20 Calcium  7.6 (albumin  3.6 wdl) Anion gap 18/20 Magnesium  2.5 Alkaline phosphatase 36 AST 82 Total bilirubin 11.9 GFR 28/25 Lactic acid 7.0 CBG's 106-185 x24 hours  NUTRITION - FOCUSED PHYSICAL EXAM: Given clinical picture and husband's nutrition history, suspect has a  degree of malnutrition however given presence of edema, unable to adequately assess muscle/subcutaneous deficits at this time. Will continue to monitor as able throughout admission.  Flowsheet Row Most Recent Value  Orbital Region Severe depletion  Upper Arm Region Unable to assess  Thoracic and Lumbar  Region Unable to assess  [distended/edematous/cirrhotic]  Buccal Region Unable to assess  [vent]  Temple Region Severe depletion  Clavicle Bone Region Unable to assess  [edema]  Clavicle and Acromion Bone Region Unable to assess  Scapular Bone Region Unable to assess  Dorsal Hand Unable to assess  [edema]  Patellar Region Unable to assess  [severe edema- LLE>RLE]  Anterior Thigh Region Unable to assess  Posterior Calf Region Unable to assess  Edema (RD Assessment) Severe  Hair Reviewed  Eyes Other (Comment)  [jaundice]  Mouth Other (Comment)  [blood]  Skin Other (Comment)  [jaundice]  Nails Reviewed    Diet Order:   Diet Order             Diet NPO time specified Except for: Ice Chips, Sips with Meds  Diet effective now                   EDUCATION NEEDS:  No education needs have been identified at this time  Skin:  Skin Assessment: Reviewed RN Assessment  Last BM:  9/16 type 7 x2 small  Height:  Ht Readings from Last 1 Encounters:  09/04/24 5' 3 (1.6 m)    Weight:  Wt Readings from Last 1 Encounters:  09/06/24 76.6 kg    Ideal Body Weight:  52.3 kg  BMI:  Body mass index is 29.91 kg/m.  Estimated Nutritional Needs:   Kcal:  1500-1700  Protein:  95-105g  Fluid:  1L + UOP  Rachel Lucas, RDN, LDN Clinical Nutrition See AMiON for contact information.

## 2024-09-06 ENCOUNTER — Inpatient Hospital Stay (HOSPITAL_COMMUNITY)

## 2024-09-06 ENCOUNTER — Ambulatory Visit (INDEPENDENT_AMBULATORY_CARE_PROVIDER_SITE_OTHER): Admitting: Otolaryngology

## 2024-09-06 ENCOUNTER — Other Ambulatory Visit (HOSPITAL_COMMUNITY): Payer: Self-pay

## 2024-09-06 DIAGNOSIS — E878 Other disorders of electrolyte and fluid balance, not elsewhere classified: Secondary | ICD-10-CM

## 2024-09-06 DIAGNOSIS — J80 Acute respiratory distress syndrome: Secondary | ICD-10-CM

## 2024-09-06 DIAGNOSIS — A419 Sepsis, unspecified organism: Secondary | ICD-10-CM | POA: Diagnosis not present

## 2024-09-06 DIAGNOSIS — I5021 Acute systolic (congestive) heart failure: Secondary | ICD-10-CM

## 2024-09-06 DIAGNOSIS — K766 Portal hypertension: Secondary | ICD-10-CM | POA: Diagnosis not present

## 2024-09-06 DIAGNOSIS — K7469 Other cirrhosis of liver: Secondary | ICD-10-CM | POA: Diagnosis not present

## 2024-09-06 DIAGNOSIS — R6521 Severe sepsis with septic shock: Secondary | ICD-10-CM | POA: Diagnosis not present

## 2024-09-06 DIAGNOSIS — D638 Anemia in other chronic diseases classified elsewhere: Secondary | ICD-10-CM

## 2024-09-06 DIAGNOSIS — E871 Hypo-osmolality and hyponatremia: Secondary | ICD-10-CM

## 2024-09-06 HISTORY — PX: IR PARACENTESIS: IMG2679

## 2024-09-06 LAB — PREPARE FRESH FROZEN PLASMA

## 2024-09-06 LAB — PROTIME-INR
INR: 3.2 — ABNORMAL HIGH (ref 0.8–1.2)
Prothrombin Time: 34.1 s — ABNORMAL HIGH (ref 11.4–15.2)

## 2024-09-06 LAB — BPAM PLATELET PHERESIS
Blood Product Expiration Date: 202509192359
Blood Product Expiration Date: 202509192359
ISSUE DATE / TIME: 202509171505
ISSUE DATE / TIME: 202509171509
Unit Type and Rh: 5100
Unit Type and Rh: 6200

## 2024-09-06 LAB — RENAL FUNCTION PANEL
Albumin: 3.6 g/dL (ref 3.5–5.0)
Albumin: 4.3 g/dL (ref 3.5–5.0)
Anion gap: 20 — ABNORMAL HIGH (ref 5–15)
Anion gap: 19 — ABNORMAL HIGH (ref 5–15)
BUN: 19 mg/dL (ref 6–20)
BUN: 16 mg/dL (ref 6–20)
CO2: 22 mmol/L (ref 22–32)
CO2: 20 mmol/L — ABNORMAL LOW (ref 22–32)
Calcium: 7.6 mg/dL — ABNORMAL LOW (ref 8.9–10.3)
Calcium: 8.1 mg/dL — ABNORMAL LOW (ref 8.9–10.3)
Chloride: 89 mmol/L — ABNORMAL LOW (ref 98–111)
Chloride: 94 mmol/L — ABNORMAL LOW (ref 98–111)
Creatinine, Ser: 2.2 mg/dL — ABNORMAL HIGH (ref 0.44–1.00)
Creatinine, Ser: 1.54 mg/dL — ABNORMAL HIGH (ref 0.44–1.00)
GFR, Estimated: 25 mL/min — ABNORMAL LOW (ref >60)
GFR, Estimated: 38 mL/min — ABNORMAL LOW (ref >60)
Glucose, Bld: 113 mg/dL — ABNORMAL HIGH (ref 70–99)
Glucose, Bld: 97 mg/dL (ref 70–99)
Phosphorus: 4.5 mg/dL (ref 2.5–4.6)
Phosphorus: 3.7 mg/dL (ref 2.5–4.6)
Potassium: 3.7 mmol/L (ref 3.5–5.1)
Potassium: 3.8 mmol/L (ref 3.5–5.1)
Sodium: 131 mmol/L — ABNORMAL LOW (ref 135–145)
Sodium: 133 mmol/L — ABNORMAL LOW (ref 135–145)

## 2024-09-06 LAB — CBC
HCT: 21.8 % — ABNORMAL LOW (ref 36.0–46.0)
Hemoglobin: 7.6 g/dL — ABNORMAL LOW (ref 12.0–15.0)
MCH: 35.8 pg — ABNORMAL HIGH (ref 26.0–34.0)
MCHC: 34.9 g/dL (ref 30.0–36.0)
MCV: 102.8 fL — ABNORMAL HIGH (ref 80.0–100.0)
Platelets: 49 K/uL — ABNORMAL LOW (ref 150–400)
RBC: 2.12 MIL/uL — ABNORMAL LOW (ref 3.87–5.11)
WBC: 17.9 K/uL — ABNORMAL HIGH (ref 4.0–10.5)
nRBC: 0 % (ref 0.0–0.2)

## 2024-09-06 LAB — COMPREHENSIVE METABOLIC PANEL WITH GFR
ALT: 38 U/L (ref 0–44)
AST: 82 U/L — ABNORMAL HIGH (ref 15–41)
Albumin: 3.6 g/dL (ref 3.5–5.0)
Alkaline Phosphatase: 36 U/L — ABNORMAL LOW (ref 38–126)
Anion gap: 18 — ABNORMAL HIGH (ref 5–15)
BUN: 19 mg/dL (ref 6–20)
CO2: 24 mmol/L (ref 22–32)
Calcium: 7.6 mg/dL — ABNORMAL LOW (ref 8.9–10.3)
Chloride: 88 mmol/L — ABNORMAL LOW (ref 98–111)
Creatinine, Ser: 2.01 mg/dL — ABNORMAL HIGH (ref 0.44–1.00)
GFR, Estimated: 28 mL/min — ABNORMAL LOW (ref >60)
Glucose, Bld: 112 mg/dL — ABNORMAL HIGH (ref 70–99)
Potassium: 3.7 mmol/L (ref 3.5–5.1)
Sodium: 130 mmol/L — ABNORMAL LOW (ref 135–145)
Total Bilirubin: 11.9 mg/dL — ABNORMAL HIGH (ref 0.0–1.2)
Total Protein: 5.5 g/dL — ABNORMAL LOW (ref 6.5–8.1)

## 2024-09-06 LAB — PREPARE PLATELET PHERESIS
Unit division: 0
Unit division: 0

## 2024-09-06 LAB — BODY FLUID CELL COUNT WITH DIFFERENTIAL
Eos, Fluid: 0 %
Lymphs, Fluid: 31 %
Monocyte-Macrophage-Serous Fluid: 11 % — ABNORMAL LOW (ref 50–90)
Neutrophil Count, Fluid: 58 % — ABNORMAL HIGH (ref 0–25)
Total Nucleated Cell Count, Fluid: 62 uL (ref 0–1000)

## 2024-09-06 LAB — BPAM FFP
Blood Product Expiration Date: 202509222359
Blood Product Expiration Date: 202509222359
ISSUE DATE / TIME: 202509171508
ISSUE DATE / TIME: 202509171508
Unit Type and Rh: 5100
Unit Type and Rh: 5100

## 2024-09-06 LAB — LACTIC ACID, PLASMA
Lactic Acid, Venous: 7 mmol/L (ref 0.5–1.9)
Lactic Acid, Venous: 6.7 mmol/L (ref 0.5–1.9)
Lactic Acid, Venous: 6.8 mmol/L (ref 0.5–1.9)

## 2024-09-06 LAB — COOXEMETRY PANEL
Carboxyhemoglobin: 1.7 % — ABNORMAL HIGH (ref 0.5–1.5)
Carboxyhemoglobin: 2 % — ABNORMAL HIGH (ref 0.5–1.5)
Methemoglobin: <0.7 % (ref 0.0–1.5)
Methemoglobin: 0.8 % (ref 0.0–1.5)
O2 Saturation: 68.5 %
O2 Saturation: 70.2 %
Total hemoglobin: 8.4 g/dL — ABNORMAL LOW (ref 12.0–16.0)
Total hemoglobin: 8.5 g/dL — ABNORMAL LOW (ref 12.0–16.0)

## 2024-09-06 LAB — CK: Total CK: 408 U/L — ABNORMAL HIGH (ref 38–234)

## 2024-09-06 LAB — POCT I-STAT 7, (LYTES, BLD GAS, ICA,H+H)
Acid-Base Excess: 0 mmol/L (ref 0.0–2.0)
Bicarbonate: 24.1 mmol/L (ref 20.0–28.0)
Calcium, Ion: 0.97 mmol/L — ABNORMAL LOW (ref 1.15–1.40)
HCT: 24 % — ABNORMAL LOW (ref 36.0–46.0)
Hemoglobin: 8.2 g/dL — ABNORMAL LOW (ref 12.0–15.0)
O2 Saturation: 97 %
Patient temperature: 36.3
Potassium: 3.7 mmol/L (ref 3.5–5.1)
Sodium: 133 mmol/L — ABNORMAL LOW (ref 135–145)
TCO2: 25 mmol/L (ref 22–32)
pCO2 arterial: 35.2 mmHg (ref 32–48)
pH, Arterial: 7.442 (ref 7.35–7.45)
pO2, Arterial: 83 mmHg (ref 83–108)

## 2024-09-06 LAB — PROTEIN, PLEURAL OR PERITONEAL FLUID: Total protein, fluid: <3 g/dL

## 2024-09-06 LAB — GLUCOSE, PLEURAL OR PERITONEAL FLUID: Glucose, Fluid: 112 mg/dL

## 2024-09-06 LAB — GLUCOSE, CAPILLARY
Glucose-Capillary: 106 mg/dL — ABNORMAL HIGH (ref 70–99)
Glucose-Capillary: 118 mg/dL — ABNORMAL HIGH (ref 70–99)
Glucose-Capillary: 51 mg/dL — ABNORMAL LOW (ref 70–99)
Glucose-Capillary: 76 mg/dL (ref 70–99)
Glucose-Capillary: 86 mg/dL (ref 70–99)
Glucose-Capillary: 87 mg/dL (ref 70–99)
Glucose-Capillary: 93 mg/dL (ref 70–99)

## 2024-09-06 LAB — AMYLASE, PLEURAL OR PERITONEAL FLUID: Amylase, Fluid: 80 U/L

## 2024-09-06 LAB — LIPASE, BLOOD: Lipase: 42 U/L (ref 11–51)

## 2024-09-06 LAB — MAGNESIUM: Magnesium: 2.5 mg/dL — ABNORMAL HIGH (ref 1.7–2.4)

## 2024-09-06 LAB — LACTATE DEHYDROGENASE, PLEURAL OR PERITONEAL FLUID: LD, Fluid: 70 U/L — ABNORMAL HIGH (ref 3–23)

## 2024-09-06 LAB — AMMONIA: Ammonia: 45 umol/L — ABNORMAL HIGH (ref 9–35)

## 2024-09-06 LAB — LEGIONELLA PNEUMOPHILA SEROGP 1 UR AG: L. pneumophila Serogp 1 Ur Ag: NEGATIVE

## 2024-09-06 LAB — ALBUMIN, PLEURAL OR PERITONEAL FLUID: Albumin, Fluid: <1.5 g/dL

## 2024-09-06 LAB — CALCIUM, IONIZED: Calcium, Ionized, Serum: 5.4 mg/dL (ref 4.5–5.6)

## 2024-09-06 LAB — PHOSPHORUS: Phosphorus: 4.5 mg/dL (ref 2.5–4.6)

## 2024-09-06 MED ORDER — CALCIUM GLUCONATE-NACL 1-0.675 GM/50ML-% IV SOLN: 1.0000 g | Freq: Once | INTRAVENOUS | Status: DC

## 2024-09-06 MED ORDER — NOREPINEPHRINE 16 MG/250ML-% IV SOLN
0.0000 ug/min | INTRAVENOUS | Status: DC
  Administered 2024-09-06: 10 ug/min via INTRAVENOUS
  Administered 2024-09-08: 6 ug/min via INTRAVENOUS
  Administered 2024-09-12: 7 ug/min via INTRAVENOUS
  Administered 2024-09-13: 2 ug/min via INTRAVENOUS
  Administered 2024-09-14: 4 ug/min via INTRAVENOUS
  Administered 2024-09-17: 10 ug/min via INTRAVENOUS
  Administered 2024-09-18: 60 ug/min via INTRAVENOUS
  Filled 2024-09-06 (×8): qty 250

## 2024-09-06 MED ORDER — CALCIUM GLUCONATE-NACL 1-0.675 GM/50ML-% IV SOLN
1.0000 g | Freq: Once | INTRAVENOUS | Status: AC
  Administered 2024-09-06: 1000 mg via INTRAVENOUS
  Filled 2024-09-06: qty 50

## 2024-09-06 MED ORDER — LIDOCAINE-EPINEPHRINE 1 %-1:100000 IJ SOLN
20.0000 mL | Freq: Once | INTRAMUSCULAR | Status: AC
  Administered 2024-09-06: 10 mL via INTRADERMAL

## 2024-09-06 MED ORDER — VITAMIN K1 10 MG/ML IJ SOLN
10.0000 mg | Freq: Once | INTRAVENOUS | Status: AC
  Administered 2024-09-06: 10 mg via INTRAVENOUS
  Filled 2024-09-06: qty 1

## 2024-09-06 MED ORDER — VITAL HP 1.0 CAL PO LIQD: 1000.0000 mL | ORAL | Status: DC

## 2024-09-06 MED ORDER — LIDOCAINE-EPINEPHRINE 1 %-1:100000 IJ SOLN
INTRAMUSCULAR | Status: AC
  Filled 2024-09-06: qty 1

## 2024-09-06 MED ORDER — ALBUMIN HUMAN 25 % IV SOLN
25.0000 g | Freq: Four times a day (QID) | INTRAVENOUS | Status: DC
  Administered 2024-09-06 – 2024-09-07 (×3): 25 g via INTRAVENOUS
  Filled 2024-09-06 (×3): qty 100

## 2024-09-06 MED ORDER — SODIUM CHLORIDE 0.9% IV SOLUTION: Freq: Once | INTRAVENOUS | Status: AC

## 2024-09-06 MED ORDER — VANCOMYCIN HCL IN DEXTROSE 1-5 GM/200ML-% IV SOLN
1000.0000 mg | INTRAVENOUS | Status: DC
  Administered 2024-09-06: 1000 mg via INTRAVENOUS
  Filled 2024-09-06: qty 200

## 2024-09-06 MED ORDER — ALBUMIN HUMAN 25 % IV SOLN
25.0000 g | Freq: Three times a day (TID) | INTRAVENOUS | Status: DC
  Administered 2024-09-06: 25 g via INTRAVENOUS
  Filled 2024-09-06: qty 100

## 2024-09-06 MED ORDER — VITAL 1.5 CAL PO LIQD
1000.0000 mL | ORAL | Status: DC
  Administered 2024-09-06 – 2024-09-08 (×2): 1000 mL

## 2024-09-06 NOTE — Progress Notes (Signed)
   Update PM rounds\\  Friend [employer] Evangelist called -> husband provided clearance and she was updated   On vent 40% Levophed  4, epi gtt 2 Fent gtt CRRT ongoing 5cc urop since AM  Now s/p 1.3L paracentesis -labs bening sent  LLE Blisters still cointue RN concernrned about cooler left foot and lost of DP but PT sill present   Plan  - contineu ICU care with pressors, vent, sedation, abx, crrt - increase albumin  fro q8h to q6h given paracentesis  - cal vascular consult   30 min additionla critical care     SIGNATURE    Dr. Dorethia Cave, M.D., F.C.C.P,  Pulmonary and Critical Care Medicine Staff Physician, [facility] Director - Interstitial Lung Disease  Program  Pulmonary Fibrosis [facility] at [facility] [place], [place], [address]   Pager: [phone], If no answer  -> Check AMION or Try [phone] Telephone (clinical office): [phone] Telephone (research): [phone]  4:56 PM 09/06/2024

## 2024-09-06 NOTE — Progress Notes (Signed)
 [facility] Progress Note  Name: Rachel Lucas MRN: [id number] DOB: 27-Mar-1961  Chief Complaint:  Leg swelling and chills  Subjective:  Seen and examined on CRRT.  Procedure supervised.  She was started on CRRT on 9/17 after nontunneled catheter placement with critical care.  She had 491 mL UF over 9/17 with CRRT.  She had 115 mL uop over 9/17 charted.  She was initially zero mL UF/hr per my order and then was transitioned to keep even as tolerated.  She has been on levo at 8 mc/gin and vaso at 0.04 units/min.  She is off of epi.  She has bullae on her left leg and her sites are weeping.  I spoke with her husband via phone this morning.   Review of systems:  Unable to obtain 2/2 intubated and sedated   ----------------------- Background on consult:  Rachel Lucas is a 63 y.o. female with a history of hypothyroidism and recent diagnosis of alcoholic cirrhosis who presented to the hospital with chills and leg swelling.  She also left lower extremity erythema.  She was diagnosed with cellulitis.  She was also found to have otitis media with moderate effusion.  Unfortunately her course was complicated by a PEA arrest on 9/16.  She did achieve ROSC - per charting had 6-7 minutes of CPR.  She is currently intubated and on norepinephrine  9 mcg/min, epinephrine  at 6 mcg/min, and vasopressin  at 0.04 units/min.  Course has also been complicated by ARDS, DIC as well as AKI.  Nephrology is consulted for assistance with management of AKI.  Pulmonary critical care is concerned that she is nearing the need for CRRT.  She had 50 mL UOP over 9/16 charted.  She has been on a bicarb gtt at 125 ml/hr.  Note that IR was consulted for a possible paracentesis - they did not feel she was stable for same per their note.  She has been on vanc and zosyn  and pharmacy is consulted for dosing of the same.  Team has a right femoral central line and art line.  They had attempted a left internal jugular  central line earlier and this was not able to be placed.    I spoke with her husband at bedside and later her husband, her sister, brother-in-law, and niece in the family room in the ICU.  We discussed the risks/benefits/indications for CRRT.  At this time, they would like to talk as a family to decide how to proceed.    Intake/Output Summary (Last 24 hours) at 09/06/2024 0625 Last data filed at 09/06/2024 0606 Gross per 24 hour  Intake 5313.19 ml  Output 706.3 ml  Net 4606.89 ml    Vitals:  Vitals:   09/06/24 0515 09/06/24 0530 09/06/24 0545 09/06/24 0600  BP:      Pulse: 64 61 61 61  Resp: (!) 24 (!) 24 (!) 24 (!) 24  Temp: 98.1 F (36.7 C) 98.1 F (36.7 C) 97.9 F (36.6 C) 98.1 F (36.7 C)  TempSrc:      SpO2: 100% 99% 100% 100%  Weight:      Height:         Physical Exam:  General adult female critically ill and intubated HEENT normocephalic blood noted in and around her mouth; sclera are icteric Neck supple trachea midline Lungs coarse mechanical breath sounds; FIO2 60 and PEEP 8 Heart S1S2 no rub Abdomen tight and distended; unable to assess tenderness 2/2 sedation Extremities 2-3+ edema bilateral lower extremities with bullae  on the left leg.  Leg with erythema diffusely.  Stigmata of chronic liver disease Neuro continuous sedation running GU foley catheter in place   Medications reviewed   Labs:     Latest Ref Rng & Units 09/06/2024    4:45 AM 09/05/2024    3:20 AM 09/04/2024    7:52 PM  BMP  Glucose 70 - 99 mg/dL 886  749    BUN 6 - 20 mg/dL 19  24    Creatinine 9.55 - 1.00 mg/dL 7.79  7.18    Sodium 864 - 145 mmol/L 131  123    124  124   Potassium 3.5 - 5.1 mmol/L 3.7  3.8    3.8  4.0   Chloride 98 - 111 mmol/L 89  85    CO2 22 - 32 mmol/L 22  19    Calcium  8.9 - 10.3 mg/dL 7.6  8.0       Assessment/Plan:    # AKI  - Secondary to ischemic ATN from her arrest.  Given that she is post-arrest and anuric and in shock, this is unfortunately  expected to worsen.  Baseline Cr less than 1.  Renal US  with bilateral increased echogenicity.  Initiated on CRRT on 9/17 after nontunneled catheter with critical care for acidosis and shock with worsening AKI and volume status  ------------- - Continue CRRT.  As below, plan for increasing dialysate tomorrow - 4K fluids  - Increase UF goal to net negative 50 to 100 mL/hr as tolerated    # Septic shock  - Pressors per primary team  - antibiotics per primary team and pharmacy is consulted for dosing    # PEA arrest  - supportive post-arrest care    # Hyponatremia  - I have recommended CRRT   - We have her on lower rate of dialysate currently and will gradually increase this - likely tomorrow   # ARDS  - Vent per critical care  - Optimize volume status with CRRT    # Metabolic acidosis - She is on CRRT - s/p bicarb gtt - now off  - Treat shock and underlying etiology as above   # Cirrhosis - decompensated  - she is on albumin  and levophed   - IR deferred bedside paracentesis on 9/17 per charting    # Hyperphosphatemia  - improved with CRRT   - follow BID labs to monitor for need to begin repleting phos   # Macrocytic anemia  - Concern for DIC - PRBC's per primary team  - With past heavy alcohol use there is concern for nutrient deficiency, as well  - markedly elevated ferritin on check in August and iron sats not calculated.  Folate low at 3.2.  she is on folic acid  and thiamine  as well.  B12 ok    # Thrombocytopenia  - note that she has had oozing from some IV sites per nursing - treating DIC per critical care team    Disposition - continue ICU monitoring    Katheryn JAYSON Saba, MD 09/06/2024 6:55 AM

## 2024-09-06 NOTE — Progress Notes (Addendum)
 NAME:  Rachel Lucas, MRN:  [id number], DOB:  March 20, 1961, LOS: 3 ADMISSION DATE:  09/03/2024, CONSULTATION DATE:  09/05/2024 REFERRING MD:  EDP, CHIEF COMPLAINT:  Left leg pain   History of Present Illness:  Patient is a 63 yo female with new diagnosis of alcoholic cirrhosis with septic shock thought 2/2 LLE cellulitis, does have persistent otitis media from recent prior admission for Klebsiella pneumonia and bacteremia.    Patient had concern for single episode of melena the day prior to admission, but subsequently had normal stools.  Had no cough, congestion, or hematemesis.  Did endorse worsening LLE swelling and redness with some pain the day of admission.   Recently, patient was admitted for Klebciella bacteremia and pneumonia in setting of accidental ear canal perforation with Q-tip (8/20-8/27).   In the ED, patient was unresponsive to fluids with BP 60/30s and PCCM was consulted for admission.  Patient stated that her baseline SBP is in the 90s.   Pertinent  Medical History  Fam hx of VWD but test 8/25 neg Alcoholic cirrhosis recent new diagnosis Sober for 1 month per self and husband   Significant Hospital Events: Including procedures, antibiotic start and stop dates in addition to other pertinent events   9/15 - Admitted, working dx LE cellulitis and SBP in setting of nex dx of ETOH related cirrhosis.  started Zosyn  (9/15-) and linezolid  (9/15-), GI consulted with no concern for GIB, CVC and Art line placed in PM 9/16 - Worsening delirium, PEA arrest for 6 minutes with ROSC, NG tube in place 9/17 EEG neg for seizure. ECHO: EF 35-40% mod dec'd LVF, pardoxical septal motion, mod to svr MR and TR. IVC w/ < 50% variability. Became hyperglycemic after switch to D10 fluids. Had minimal urine output at 50 mL. Has not been agitated, occasionally spontaneously moves extremities. Nephro consulted. HD cath placed.  CRRT started. IR consulted. Felt not stable for para. Stating:  coagulopathy, shock and hypothermia as factors. IPAL: full code. 9/18 had been off epinephrine  overnight. Back on it in am. Lactic acid 9-->7. Na 123->130, LFTs AST down trending but tbili up, hgb 8->7.6    Interim History / Subjective:  Still critically ill still on 3 pressors.   Objective    Blood pressure 93/63, pulse 68, temperature 98.1 F (36.7 C), resp. rate (!) 25, height 5' 3 (1.6 m), weight 76.6 kg, SpO2 100%.    Vent Mode: PRVC FiO2 (%):  [40 %-60 %] 40 % Set Rate:  [24 bmp] 24 bmp Vt Set:  [420 mL] 420 mL PEEP:  [8 cmH20] 8 cmH20 Plateau Pressure:  [27 cmH20-30 cmH20] 28 cmH20   Intake/Output Summary (Last 24 hours) at 09/06/2024 9071 Last data filed at 09/06/2024 0908 Gross per 24 hour  Intake 5174.47 ml  Output 1080.3 ml  Net 4094.17 ml   Filed Weights   09/04/24 0430 09/05/24 0500 09/06/24 0500  Weight: 63.7 kg 72.4 kg 76.6 kg    Examination: General This is a critically ill 63 year old female patient she remains on full ventilatory support as well as multiple pressors HEENT orally intubated right IJ catheter, sclera are icteric with scleral edema Pulmonary scattered rhonchi, current Plateau pressure 22, driving pressure 14. PCXR ett good position. On going diffuse airspace disease Card brady RRR no MRG Abd shifting dullness. High gastric output. Hypoactive bowel sounds Ext diffuse anasarca. Generalized ecchymosis. Left lower extremity ecchymotic. Swollen. Multiple bullae draining. Pulses still palp via doppler  Neuro responds to pain. No focal def  Gu anuric   Assessment and Plan   Septic shock w/ lactic acidosis  2/2 GNR bacteremia: working dx cellulitis +/-SBP, further complicated by acute systolic heart failure, and demand ischemia w/ elevated trop I  -not candidate for Left heart cath; not candidate for [facility] given coagulopathy -? CM from sepsis or etoh?  -remains on 3 pressors.  -lactate slow to clear but suspect her liver fxn playing a  role Plan Transduce CVP Trend lactate Send co-ox; will titrate epi for co-ox > 70  Titrate NE for MAP > 65 Cont shock dose vasopressin  Hold midodrine  for now (not convinced she is even absorbing it) Paracentesis prob moot point this far out on abx (will d/w team) Keep euvolemic Cont stress dose steroids Day 4 zosyn , await cultures Day 2 vanc (day 4 GPC coverage for possible cellulitis); will cont GNR coverage for at least 7d, GPC coverage still TBD, reluctant to stop given shock state  No GDMT given shock state  Stopping octreotide  (not bleeding.)  Repeat tCk and lactate. If these start going up again OR if limb looks worse (LLE) w/ volume removal will need to repeat imaging (hope will not need to remove limb)  Acute hypoxic respiratory failure 2/2 Acute lung injury, ARDS,  ? 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 and also concern for post-anoxic injury after cardiac arrest Plan Continue supportive care RASS goal -2, cont current fent and versed  gtt Cont lactulose  and rifaximin  Daily wake up assessment  MRI 19th if no sig neurological improvement  Cont thiamine  and folate   AKI 2/2 ischemic ATN post arrest Plan Cont CRRT Keep foley Serial chems Adjust meds for CRRT and renal dosing Strict I&O Bicarb stopped  Decompensated alcoholic cirrhosis with ascites, know gastric varices,  hepatic encephalopathy and  shock liver and rising T-bili -not candidate for xplant Plan Cont to trend LFTs Getting 1 more day of albumin  25gms x 4 d Cont rifaxiamine and lactulose   If T bili cont to rise may need to repeat abd US  r/o acalculous cholecystitis (had sludge on US  on 16th)   Fluid and electrolyte imbalance: hyponatremia in setting of  cirrhosis and volume overload.  -Na up from 123 to 130 so w/in safe parameters for correction but pt does have increased risk for CPM given cirrhosis, malnutrition and ETOH hx.  Plan Cont to trend  chems Would prefer to normalize Na slowly given increased CPM risk although renal failure in some cases found to be protective for CPM  Critical illness related protein calorie malnutrition. Large vol gastric output - Appreciate RD consult Plan Cortrak placement today 1 view abd now r/o ileus   Chronic macrocytic anemia .  Status post 1 unit PRBC 9/16. plan Continue to trend H&H PRBC for hgb </= 6.9  Acute on chronic Thrombocytopenia 2/2 sepsis superimposed on cirrhosis  -got PLTs for procedure 9/17, stable overnight  Plan Trend  SCDs Abx changed to vanc from zyvox  for possible drug induced component  Coagulopathy (septic coagulopathy superimposed on  decompensated cirrhosis) INR 3.2. no active bleeding Plan Vit K  Trend INR   Hypothyroidism Plan Continue levothyroxine  88 mcg  Glycemic control. Was hypoglycemic. Now on steroids Plan Trend cbgs q4  cellulitis of LLE w diffuse ecchymosis Neg for DVT on 9/17  Plan Cont to monitor Close pulse checks Keep LE elevated  Abx as above    History of L TM perforation with open TM and wick in place Consulted Dr. Tobie with [facility] 9/16, who related that he has a no suspicion for mastoiditis after reviewing CT imaging.  Noted that patient has a packing wick in place and TM is open, so fluid would be expected but does not suspect mastoiditis based on radiography. plan Ciprodex  drops BID left ear Monitor postauricular area for proptosis     Critical care time: 55 min      ATTESTATION & SIGNATURE   STAFF NOTE: I, Dr [name] Cave have personally reviewed patient's available data, including medical history, events of note, physical examination and test results as part of my evaluation. I have discussed with resident/NP and other  care providers such as pharmacist, RN and RRT.  In addition,  I personally evaluated patient and elicited key findings of   S: Date of admit 09/03/2024 with LOS 3 for today 09/06/2024 : Rachel Lucas is  some improvement in pressor needs. Fio2 needs at 40%. CRRT running negative. UR op around 5-10cc/h. HAd Big NG returns after clamping . RD wants to try trickle feeds. Had BM. Getting lactulose  and xifana  Lactate down to 7  HAS NEW BLISTERINR LLE  Growing GNR   O:  Blood pressure 93/63, pulse (!) 55, temperature 97.7 F (36.5 C), resp. rate (!) 24, height 5' 3 (1.6 m), weight 76.6 kg, SpO2 97%.   Looks critially ill Jaundiced Blisters LLE  Abd soft On vent   LABS    PULMONARY Recent Labs  Lab 09/04/24 0631 09/04/24 1355 09/04/24 1952 09/05/24 0320 09/06/24 1022 09/06/24 1034 09/06/24 1139  PHART 7.285* 7.243* 7.251* 7.359  --  7.442  --   PCO2ART 44.1 44.2 38.0 36.5  --  35.2  --   PO2ART 89 283* 65* 83  --  83  --   HCO3 21.3 19.4* 16.7* 20.9  --  24.1  --   TCO2 23 21* 18* 22  --  25  --   O2SAT 96 100 89 96 68.5 97 70.2    CBC Recent Labs  Lab 09/04/24 1328 09/04/24 1355 09/05/24 0320 09/06/24 0445 09/06/24 1034  HGB 6.8*   < > 8.0*  8.5* 7.6* 8.2*  HCT 20.5*   < > 23.4*  25.0* 21.8* 24.0*  WBC 6.4  --  12.0* 17.9*  --   PLT 46*  --  44* 49*  --    < > = values in this interval not displayed.    COAGULATION Recent Labs  Lab 09/04/24 0415 09/04/24 1130 09/04/24 1328 09/04/24 1836 09/06/24 0911  INR 3.0* 4.4* 5.6* 5.4* 3.2*    CARDIAC  No results for input(s): TROPONINI in the last 168 hours. No results for input(s): PROBNP in the last 168 hours.   CHEMISTRY Recent Labs  Lab 09/04/24 0415 09/04/24 0523 09/04/24 1328 09/04/24 1355 09/04/24 1836 09/04/24 1952 09/05/24 0320 09/06/24 0445 09/06/24 1034  NA 124*   < > 130*   < > 127* 124* 123*  124* 130*  131* 133*  K 4.1   < > 4.2   < > 4.2 4.0 3.8  3.8 3.7  3.7 3.7   CL 95*  --  91*  --  87*  --  85* 88*  89*  --   CO2 16*  --  19*  --  15*  --  19* 24  22  --   GLUCOSE 134*  --  92  --  137*  --  250* 112*  113*  --   BUN 22*  --  21*  --  23*  --  24* 19  19  --   CREATININE 2.43*  --  2.60*  --  2.61*  --  2.81* 2.01*  2.20*  --   CALCIUM  7.3*  --  10.5*  --  7.8*  --  8.0* 7.6*  7.6*  --   MG 1.3*  --  2.9*  --   --   --  2.6* 2.5*  --   PHOS 7.4*  --   --   --   --   --  7.4* 4.5  4.5  --    < > = values in this interval not displayed.   Estimated Creatinine Clearance: 29.2 mL/min (A) (by C-G formula based on SCr of 2.01 mg/dL (H)).   LIVER Recent Labs  Lab 09/03/24 1320 09/03/24 2130 09/04/24 0415 09/04/24 1130 09/04/24 1328 09/04/24 1836 09/05/24 0320 09/06/24 0445 09/06/24 0911  AST 178*  --  126*  --  102*  --  100* 82*  --   ALT 67*  --  55*  --  39  --  38 38  --   ALKPHOS 91  --  57  --  34*  --  30* 36*  --   BILITOT 9.8*  --  10.6*  --  8.5*  --  9.8* 11.9*  --   PROT 5.4*  --  6.1*  --  5.4*  --  5.6* 5.5*  --   ALBUMIN  1.8*  --  3.0*  --  3.1*  --  3.5 3.6  3.6  --   INR  --    < > 3.0* 4.4* 5.6* 5.4*  --   --  3.2*   < > = values in this interval not displayed.     INFECTIOUS Recent Labs  Lab 09/03/24 2130 09/04/24 0039 09/04/24 1836 09/05/24 0320 09/06/24 0445  LATICACIDVEN 6.5*   < > >9.0* 9.0* 7.0*  PROCALCITON 30.61  --   --   --   --    < > = values in this interval not displayed.     ENDOCRINE CBG (last 3)  Recent Labs    09/06/24 0304 09/06/24 0812 09/06/24 1146  GLUCAP 118* 106* 86         IMAGING x24h  - image(s) personally visualized  -   highlighted in bold DG CHEST PORT 1 VIEW Result Date: 09/05/2024 CLINICAL DATA:  [id number] Encounter for central line placement [id number] EXAM: PORTABLE CHEST - 1 VIEW COMPARISON:  09/05/2024 7:28 a.m. FINDINGS: Similarly positioned endotracheal tube terminating in the mid trachea. Right IJ approach central venous catheter has been placed in the  interim, terminating in the lower SVC. Overlying defibrillator pads again noted. Esophagogastric tube courses below the diaphragm with the distal tip not included in the field of view. Unchanged diffuse hazy airspace disease throughout both lungs. Likely layering bilateral pleural effusions are also present. Mild cardiomegaly. No pneumothorax. IMPRESSION: Interval placement of a right IJ approach central venous catheter, which terminates in the lower SVC. No pneumothorax. Otherwise, little significant interval change to the lungs. Electronically Signed   By: Rogelia Myers M.D.   On: 09/05/2024 16:32      A:  Severe shock with sepsis and DIC  Systolic dysfunction Gram negative septicemia New  blisters in LLE ALI Ascites - needs paracentesis  P:  Full supportive ICU Care Start trickle feeds Cotninue pressors Get wound care Continue abx [place]ue CRRT Get paracentesis via IR  Husband updated   Anti-infectives (From admission, onward)    Start     Dose/Rate Route Frequency Ordered Stop   09/06/24 1215  vancomycin  (VANCOCIN ) IVPB 1000 mg/200 mL premix        1,000 mg 200 mL/hr over 60 Minutes Intravenous Every 24 hours 09/06/24 1127     09/05/24 1000  rifaximin  (XIFAXAN ) tablet 550 mg        550 mg Per Tube 2 times daily 09/05/24 0813     09/05/24 0900  vancomycin  (VANCOREADY) IVPB 1500 mg/300 mL        1,500 mg 150 mL/hr over 120 Minutes Intravenous  Once 09/05/24 0804 09/05/24 1049   09/05/24 0804  vancomycin  variable dose per unstable renal function (pharmacist dosing)  Status:  Discontinued         Does not apply See admin instructions 09/05/24 0804 09/05/24 1542   09/04/24 1230  rifaximin  (XIFAXAN ) tablet 550 mg  Status:  Discontinued        550 mg Oral 2 times daily 09/04/24 1132 09/05/24 0813   09/04/24 0000  piperacillin -tazobactam (ZOSYN ) IVPB 3.375 g       Placed in Followed by Linked Group   3.375 g 12.5 mL/hr over 240 Minutes Intravenous Every 8 hours 09/03/24 1648      09/03/24 1800  linezolid  (ZYVOX ) IVPB 600 mg  Status:  Discontinued        600 mg 300 mL/hr over 60 Minutes Intravenous Every 12 hours 09/03/24 1643 09/05/24 0758   09/03/24 1700  piperacillin -tazobactam (ZOSYN ) IVPB 3.375 g       Placed in Followed by Linked Group   3.375 g 100 mL/hr over 30 Minutes Intravenous  Once 09/03/24 1648 09/03/24 2302   09/03/24 1615  cefTRIAXone  (ROCEPHIN ) 2 g in sodium chloride  0.9 % 100 mL IVPB  Status:  Discontinued        2 g 200 mL/hr over 30 Minutes Intravenous  Once 09/03/24 1609 09/03/24 1641        Rest per NP/medical resident whose note is outlined above and that I agree with  The patient is critically ill with multiple organ systems failure and requires high complexity decision making for assessment and support, frequent evaluation and titration of therapies, application of advanced monitoring technologies and extensive interpretation of multiple databases.   Critical Care Time devoted to patient care services described in this note is  20  Minutes. This time reflects time of care of this signee Dr Dorethia Cave. This critical care time does not reflect procedure time, or teaching time or supervisory time of PA/NP/Med student/Med Resident etc but could involve care discussion time     Dr. Dorethia Cave, M.D., [facility].C.P Pulmonary and Critical Care Medicine Staff Physician [place] [place] [facility] Pager: [phone], If no answer or between  15:00h - 7:00h: call [phone]  09/06/2024 12:12 PM

## 2024-09-06 NOTE — Progress Notes (Signed)
 NAME:  Rachel Lucas, MRN:  [id number], DOB:  12-Sep-1961, LOS: 3 ADMISSION DATE:  09/03/2024, CONSULTATION DATE:  09/03/2024 REFERRING MD:  EDP, CHIEF COMPLAINT:  Left leg pain  History of Present Illness:  Patient is a 63 yo female with new diagnosis of alcoholic cirrhosis with septic shock thought 2/2 LLE cellulitis, does have persistent otitis media from recent prior admission for Klebsiella pneumonia and bacteremia.   Patient had concern for single episode of melena the day prior to admission, but subsequently had normal stools.  Had no cough, congestion, or hematemesis.  Did endorse worsening LLE swelling and redness with some pain the day of admission.   Recently, patient was admitted for Klebciella bacteremia and pneumonia in setting of accidental ear canal perforation with Q-tip (8/20-8/27).   In the ED, patient was unresponsive to fluids with BP 60/30s and PCCM was consulted for admission.  Patient stated that her baseline SBP is in the 90s.   Pertinent  Medical History  Fam hx of VWD but test 8/25 neg Alcoholic cirrhosis recently diagnosed Sober for 1 month per self and husband   Significant Hospital Events: Including procedures, antibiotic start and stop dates in addition to other pertinent events   9/15 - Admitted, started Zosyn  (9/15-) and linezolid  (9/15-9/17), GI consulted with no concern for GIB, CVC and Art line placed in PM 9/16 - Worsening delirium, PEA arrest for 6 minutes with ROSC, NG tube in place 9/17 - Transitioned from linezolid  to vancomycin  (9/17-).  Nephro consulted and CRRT initiated with 491 mL UF throughout the day and 115 mL UOP, R internal jugular CVC placed. 9/18 - Epinephrine  discontinued ON but resumed in AM, lactate downtrending, paracentesis per IR.   Interim History / Subjective:  Overnight, the patient was briefly take off epinephrine , now restarted and remains on vaso and norepi. Has developed weeping from bullae on LLE and R forearm. Has  also been having increasing diarrhea on lactulose . 800 mL NG tube output this AM.   Objective    Blood pressure (!) 84/50, pulse 62, temperature 98.1 F (36.7 C), resp. rate (!) 24, height 5' 3 (1.6 m), weight 76.6 kg, SpO2 100%.    Vent Mode: PRVC FiO2 (%):  [50 %-60 %] 60 % Set Rate:  [24 bmp] 24 bmp Vt Set:  [420 mL] 420 mL PEEP:  [5 cmH20-8 cmH20] 8 cmH20 Plateau Pressure:  [20 cmH20-30 cmH20] 30 cmH20   Intake/Output Summary (Last 24 hours) at 09/06/2024 0720 Last data filed at 09/06/2024 0700 Gross per 24 hour  Intake 5180.89 ml  Output 836.3 ml  Net 4344.59 ml   Filed Weights   09/04/24 0430 09/05/24 0500 09/06/24 0500  Weight: 63.7 kg 72.4 kg 76.6 kg    Examination: General: Sedated and resting in bed, intubated, NAD. HEENT: ET in place.  NG in place.  R internal jugular in place, no significant oozing or drainage around site.  Significant scleral icterus and proptosis bilaterally. Cardiovascular: Regular rate and rhythm, quiet.  No murmurs or rubs appreciated. Poorly palpable radial pulses. Pulmonary: On ventilator, PRVC at 40% FiO2.  Coarse breath sounds throughout. Abdominal: Moderately distended abdomen with dullness to precussion. Derm: Multiple weeping erythematous bullae, most notable on LLE, R forearm. Extremities: Femoral line and art line in place, no bleeding at sites.  1+ pitting peripheral edema RLE, 3+ edema LLE bilaterally.  LLE mottled and with multiple tense weeping bullae as well as extensive overlying erythema.  Capillary refill 2-3 seconds.  Difficult to  palpate peripheral pulses given edema. Neuro: RASS -2, occasionally spontaneously moves extremities. Pupils reactive.   Resolved problem list   Assessment and Plan   PULMONARY: Acute lung injury, ARDS, MODS, acute hypoxemic respiratory failure A:  Remains intubated on PRVC since 9/16.  Viral and bacterial pathogen panels negative. - 09/06/2024: Repeat chest x-ray this morning with minimal  interval change, some improvement.  Currently on 40% FiO2, weaning progressively. P: - Continue PRVC, continue pulmonary and oral hygiene - Strep pneumo urinary antigen negative, Legionella pending - Stress dose hydrocortisone  100 mg Q8h - RASS goal -2 - Daily AM ABG     NEUROLOGIC: Acute metabolic encephalopathy 2/2 sepsis and cirrhosis A: 6-minute cardiac arrest 9/17.  Postarrest EEG with diffuse encephalopathy.  Will continue to monitor for neurologic recovery after arrest.  Has been sober for 1 month, no concern for alcohol withdrawal. - 09/06/2024: Sedated, encephalopathic. P: - Sedation with fentanyl  gtt and Versed  PRN, RASS goal -2 - NSE at 72 hours postarrest on 9/19, consider MRI     VASCULAR: Septic shock with GNR bacteremia A:   Currently in septic shock requiring pressor support. - 09/06/2024: Pressor requirement has again decreased overnight and briefly stopped epi.  Will obtain Coox and CVP to titrate pressors. P:  - Titrate norepi to goal MAP >65 - Titrate epi to goal Coox O2>70 - Continue vasopressin  - Midodrine  10 mg TID    CARDIAC STRUCTURAL: Reduced ejection fraction with severe MR, cardiomyopathy A: Echo 9/16 postarrest showing EF 35-40% with severe MR, moderate to severe TR, and ascites.  Cardiomyopathy in setting of sepsis versus EtOH or other etiology. - 09/06/2024: Will seek to optimize inotropic support with epi iso cardiomyopathy. P: - Hold GDMT given shock - Epi for inotropic support as above     CARDIAC ELECTRICAL: Status post PEA arrest 9/16 A: ROSC achieved after 6 minutes.  Suspect septic shock is most likely cause.  Postarrest EKG yesterday without acute ischemic changes to suggest MI, normal QTc. - 09/05/2024: NSR on monitor. P: - Continuous cardiac monitoring - Follow ionized calcium  Q12h, correct with calcium  gluconate as appropriate     INFECTIOUS: Severe sepsis with septic shock (?LLE cellulitis vs SBP), GNR bacteremia A: Currently  with severe sepsis and septic shock, possible 2/2 LLE cellulitis though SBP remains a strong consideration with GNR bacteremia.  Antibiotics currently covering cellulitis and SBP.  Per ENT consult 9/16, do not favor mastoiditis or AOM as source. - 09/06/2024: Lactate trending down to 7.0.  Now growing gram negative rods in aerobic cultures. P: - Continue Zosyn  (9/15-) and vancomycin  (9/17-) - Rifaximin  (9/16-) for SBP coverage - Paracentesis per IR - Follow Bcx, NGTD @ 2 days - Daily and PM lactate     RENAL: Acute renal failure A: Now on CRRT, appreciate assistance from Nephorology. - 09/06/2024: 115 mL UOP past 24 hours.  621 mL UF via CRRT past 24 hours.  Creatinine mildly improved to 2.2.  Now net +14L this admission, likely contributing to edema, ascites; hope CRRT will assist with volume removal. P: - Foley in place - Continue CRRT per Nephrology     METABOLIC A: Increased metabolic demand given MODS and septic shock. - 09/06/2024: Currently with high NGT output (800 mL), would like to start NGT feed, reassuringly also having BMs. P: - Trickle feed through NG per RD     ELECTROLYTES A: Goal to optimize electrolytes given risk for repeat arrest. - 09/06/2024: Na improving (123>130), K stable on CRRT.  Phosphorus greatly improved to 4.5.  Aim for gradual correction to Na to avoid osmotic demyelination. P: - NPO, oral meds per tube - CRRT as above - Calcium  gluconate x1 now that phosphorus is improved on CRRT - Daily phosphorus and magnesium , CMP     GASTROINTESTINAL: Decompensated alcoholic cirrhosis with ascites, shock liver, history of gastric varices A:   Patient does have decompensated alcoholic cirrhosis with moderate ascites per US  9/16.  Cirrhosis significantly worsens prognosis for this severe illness, likely slowing clearance of lactate. - 09/06/2024: Considering SBP given that patient is growing GNRs, though is already on SBP treatment.  IR to proceed with  paracentesis today.  800 mL output from NG tube, holding PO meds temporarily. P:   - d/c ocreotide, no current variceal bleed - Pantoprazole  40 mg BID - Rifaximin  550 mg BID - Thiamine , folate, MVI PO daily - Hold beta blockers given shock - IR to proceed with paracentesis today - Status post 8 albumin  infusions 25 g Q6h, will change to Q8h - Lactulose  30 mg TID per tube    HEMATOLOGIC: Chronic macrocytic anemia, DIC A:  Concern for MODS and cytokine storm post arrest iso septic shock.  Status post 1 unit PRBC 9/16. - 09/06/2024: Hemoglobin now <8 but overall stable, will CTM closely. P: - Continue to trend CBC daily, monitor for signs of bleeding - PRBC for hgb </= 6.9     HEMATOLOGIC - Platelets: Coagulopathy (DIC/sepsis vs decompensated cirrhosis) A: Has underlying hepatic dysfunction given cirrhosis.  Currently with DIC. - 09/06/2024: Platelets stable 44>49 off linezolid , INR 3.2 without active bleeding. P: - Consider FFP if platelets <10 or signs of bleeding with coagulopathy - Platelets infusion x1 today prior to paracentesis, goal >50 - Daily INR     ENDOCRINE: Hypothyroidism A:   Patient with history of hypothyroidism. - 09/06/2024: Recent CBGs have been stable in 80-100 range. P: - Continue levothyroxine  88 mcg - Stress dose steroids as above - POCT CBGs Q4h     MSK/DERM: Bullous dermatitis of LLE, thought to be 2/2 cellulitis A: LLE with probable cellulitis. - 09/06/2024: Has now developed tense bursting and weeping bullae of LLE and R forearm.  Infection versus edema, volume removal with CRRT may assist with improvement. 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 Tobie with [facility] 9/16, who related that he has a no suspicion for mastoiditis after reviewing CT imaging.  Noted that patient has a packing wick in place and TM is open, so fluid would be expected but  does not suspect mastoiditis based on radiography. P: - Ciprodex  drops BID left ear - Monitor postauricular area for proptosis    Labs   CBC: Recent Labs  Lab 09/03/24 1320 09/04/24 0415 09/04/24 0523 09/04/24 1130 09/04/24 1328 09/04/24 1355 09/04/24 1838 09/04/24 1952 09/05/24 0320 09/06/24 0445  WBC 3.3* 5.8  --   --  6.4  --   --   --  12.0* 17.9*  NEUTROABS 2.2  --   --   --   --   --   --   --   --   --   HGB 9.8* 8.3*   < >  --  6.8* 9.2* 8.1* 7.5* 8.0*  8.5* 7.6*  HCT 30.7* 24.4*   < >  --  20.5* 27.0* 24.2* 22.0* 23.4*  25.0* 21.8*  MCV 120.4* 115.6*  --   --  115.8*  --   --   --  104.0* 102.8*  PLT 93* 73*  --  52* 46*  --   --   --  44* 49*   < > = values in this interval not displayed.    Basic Metabolic Panel: Recent Labs  Lab 09/04/24 0415 09/04/24 0523 09/04/24 1328 09/04/24 1355 09/04/24 1836 09/04/24 1952 09/05/24 0320 09/06/24 0445  NA 124*   < > 130* 128* 127* 124* 123*  124* 131*  K 4.1   < > 4.2 3.9 4.2 4.0 3.8  3.8 3.7  CL 95*  --  91*  --  87*  --  85* 89*  CO2 16*  --  19*  --  15*  --  19* 22  GLUCOSE 134*  --  92  --  137*  --  250* 113*  BUN 22*  --  21*  --  23*  --  24* 19  CREATININE 2.43*  --  2.60*  --  2.61*  --  2.81* 2.20*  CALCIUM  7.3*  --  10.5*  --  7.8*  --  8.0* 7.6*  MG 1.3*  --  2.9*  --   --   --  2.6*  --   PHOS 7.4*  --   --   --   --   --  7.4* 4.5   < > = values in this interval not displayed.   GFR: Estimated Creatinine Clearance: 26.7 mL/min (A) (by C-G formula based on SCr of 2.2 mg/dL (H)). Recent Labs  Lab 09/03/24 2130 09/04/24 0039 09/04/24 0415 09/04/24 1130 09/04/24 1328 09/04/24 1347 09/04/24 1836 09/05/24 0320 09/06/24 0445  PROCALCITON 30.61  --   --   --   --   --   --   --   --   WBC  --   --  5.8  --  6.4  --   --  12.0* 17.9*  LATICACIDVEN 6.5*   < > 5.8*   < >  --  >9.0* >9.0* 9.0* 7.0*   < > = values in this interval not displayed.    Liver Function Tests: Recent Labs  Lab  09/03/24 1320 09/04/24 0415 09/04/24 1328 09/05/24 0320 09/06/24 0445  AST 178* 126* 102* 100*  --   ALT 67* 55* 39 38  --   ALKPHOS 91 57 34* 30*  --   BILITOT 9.8* 10.6* 8.5* 9.8*  --   PROT 5.4* 6.1* 5.4* 5.6*  --   ALBUMIN  1.8* 3.0* 3.1* 3.5 3.6   Recent Labs  Lab 09/03/24 1320 09/05/24 1738  LIPASE 37 37  AMYLASE  --  41   Recent Labs  Lab 09/03/24 1320  AMMONIA 33    ABG    Component Value Date/Time   PHART 7.359 09/05/2024 0320   PCO2ART 36.5 09/05/2024 0320   PO2ART 83 09/05/2024 0320   HCO3 20.9 09/05/2024 0320   TCO2 22 09/05/2024 0320   ACIDBASEDEF 4.0 (H) 09/05/2024 0320   O2SAT 96 09/05/2024 0320     Coagulation Profile: Recent Labs  Lab 09/03/24 2130 09/04/24 0415 09/04/24 1130 09/04/24 1328 09/04/24 1836  INR 3.9* 3.0* 4.4* 5.6* 5.4*    Cardiac Enzymes: Recent Labs  Lab 09/04/24 1836 09/05/24 0320  CKTOTAL 318* 476*    HbA1C: No results found for: HGBA1C  CBG: Recent Labs  Lab 09/05/24 1118 09/05/24 1631 09/05/24 1942 09/05/24 2331 09/06/24 0304  GLUCAP 188* 185* 160* 125* 118*    Past Medical History:  She,  has a past medical history of Hypothyroidism, Thyroid  disease, and Von Willebrand disease (HCC).   Surgical History:   Past Surgical History:  Procedure Laterality Date   CYST REMOVAL NECK  1995     Social History:   reports that she quit smoking about 34 years ago. Her smoking use included cigarettes. She started smoking about 44 years ago. She has a 2 pack-year smoking history. She does not have any smokeless tobacco history on file. She reports that she does not currently use alcohol. She reports that she does not use drugs.   Family History:  Her family history includes Asthma in her mother; Eczema in her mother; Heart Problems in her father.   Allergies Allergies  Allergen Reactions   Levofloxacin Other (See Comments)   Sulfonamide Derivatives Palpitations     Home Medications  Prior to Admission  medications   Medication Sig Start Date End Date Taking? Authorizing Provider  folic acid  (FOLVITE ) 1 MG tablet Take 1 tablet (1 mg total) by mouth daily. 08/16/24  Yes Dennise Lavada POUR, MD  furosemide  (LASIX ) 20 MG tablet Take 1 tablet (20 mg total) by mouth daily. 08/30/24  Yes Craig Palma R, PA-C  lactulose , encephalopathy, (ENULOSE ) 10 GM/15ML SOLN Take 15 mLs (10 g total) by mouth 2 (two) times daily. 08/21/24  Yes Federico Rosario BROCKS, MD  levothyroxine  (SYNTHROID ) 88 MCG tablet Take 1 tablet (88 mcg total) by mouth daily before breakfast. 08/15/24  Yes Singh, Prashant K, MD  midodrine  (PROAMATINE ) 5 MG tablet Take 1 tablet (5 mg total) by mouth 2 (two) times daily with a meal. 08/30/24  Yes Craig Palma SAUNDERS, PA-C  propranolol  (INDERAL ) 10 MG tablet Take 1 tablet (10 mg total) by mouth 2 (two) times daily. 08/30/24  Yes Craig Palma SAUNDERS, PA-C  spironolactone  (ALDACTONE ) 50 MG tablet Take 1 tablet (50 mg total) by mouth daily. 08/30/24  Yes Craig Palma SAUNDERS, PA-C  thiamine  (VITAMIN B1) 100 MG tablet Take 1 tablet (100 mg total) by mouth daily. 08/16/24  Yes Singh, Prashant K, MD  potassium chloride  SA (KLOR-CON  M) 20 MEQ tablet Take 1 tablet (20 mEq total) by mouth daily for 2 days. 08/21/24 08/31/24  Federico Rosario BROCKS, MD     Critical care time: 60 minutes     Jannah Guardiola Toma, MD PGY-2, ICU FM Resident 09/06/24, 7:20 AM

## 2024-09-06 NOTE — Progress Notes (Signed)
 [facility] ROUNDING NOTE   Subjective: Patient continues to require three pressors.  Vent settings stable.  Hgb stable.  No bloody OG output, nonbloody stool today.  Has developed significant LLE bullae Underwent paracentesis to evaluate for SBP, fluid analysis not consistent with SBP   Objective: Vital signs in last 24 hours: Temp:  [95.7 F (35.4 C)-99.7 F (37.6 C)] 97.9 F (36.6 C) (09/18 1319) Pulse Rate:  [55-86] 58 (09/18 1319) Resp:  [13-28] 24 (09/18 1319) BP: (47-93)/(25-63) 93/63 (09/18 0801) SpO2:  [86 %-100 %] 100 % (09/18 1319) Arterial Line BP: (59-149)/(31-80) 102/54 (09/18 1319) FiO2 (%):  [40 %-60 %] 40 % (09/18 1056) Weight:  [76.6 kg] 76.6 kg (09/18 0500) Last BM Date : 09/06/24 General: Ill appearing Caucasian female, intubated Lungs:  Coarse breath sounds b/l Heart:  RRR, no m/r/g Abdomen:  Soft, NT, mildly distended, hypoactive bowel sounds, not high pitched Ext: Anasarca; extensive bullae in LLE, both intact and disrupted    Intake/Output from previous day: 09/17 0701 - 09/18 0700 In: 5180.9 [I.V.:3580; Blood:487; NG/GT:315; IV Piggyback:798.9] Out: 836.3 [Urine:115; Emesis/NG output:100] Intake/Output this shift: Total I/O In: 985 [I.V.:427.7; Blood:30; NG/GT:80; IV Piggyback:447.4] Out: 1473 [Urine:20; Emesis/NG output:700]   Lab Results: Recent Labs    09/04/24 1328 09/04/24 1355 09/05/24 0320 09/06/24 0445 09/06/24 1034  WBC 6.4  --  12.0* 17.9*  --   HGB 6.8*   < > 8.0*  8.5* 7.6* 8.2*  PLT 46*  --  44* 49*  --   MCV 115.8*  --  104.0* 102.8*  --    < > = values in this interval not displayed.   BMET Recent Labs    09/04/24 1836 09/04/24 1952 09/05/24 0320 09/06/24 0445 09/06/24 1034  NA 127*   < > 123*  124* 130*  131* 133*  K 4.2   < > 3.8  3.8 3.7  3.7 3.7  CL 87*  --  85* 88*  89*  --   CO2 15*  --  19* 24  22  --   GLUCOSE 137*  --  250* 112*  113*  --   BUN 23*  --  24* 19  19  --    CREATININE 2.61*  --  2.81* 2.01*  2.20*  --   CALCIUM  7.8*  --  8.0* 7.6*  7.6*  --    < > = values in this interval not displayed.   LFT Recent Labs    09/04/24 0415 09/04/24 1328 09/05/24 0320 09/06/24 0445  PROT 6.1* 5.4* 5.6* 5.5*  ALBUMIN  3.0* 3.1* 3.5 3.6  3.6  AST 126* 102* 100* 82*  ALT 55* 39 38 38  ALKPHOS 57 34* 30* 36*  BILITOT 10.6* 8.5* 9.8* 11.9*  BILIDIR 4.9*  --   --   --   IBILI 5.7*  --   --   --    PT/INR Recent Labs    09/04/24 1836 09/06/24 0911  INR 5.4* 3.2*      Imaging/Other results: DG CHEST PORT 1 VIEW Result Date: 09/06/2024 CLINICAL DATA:  Endotracheally intubated, ARDS EXAM: PORTABLE CHEST 1 VIEW COMPARISON:  09/05/2024 FINDINGS: Endotracheal tube is 5 cm above the carina. NG tube is in the stomach. Right dialysis catheter is unchanged. Heart and mediastinal contours are within normal limits. Airspace disease improved since prior study. Probable small layering effusions. IMPRESSION: Diffuse bilateral airspace disease improving since prior study. Small layering effusions. Electronically Signed   By: Franky Crease M.D.  On: 09/06/2024 13:41   DG CHEST PORT 1 VIEW Result Date: 09/05/2024 CLINICAL DATA:  [id number] Encounter for central line placement [id number] EXAM: PORTABLE CHEST - 1 VIEW COMPARISON:  09/05/2024 7:28 a.m. FINDINGS: Similarly positioned endotracheal tube terminating in the mid trachea. Right IJ approach central venous catheter has been placed in the interim, terminating in the lower SVC. Overlying defibrillator pads again noted. Esophagogastric tube courses below the diaphragm with the distal tip not included in the field of view. Unchanged diffuse hazy airspace disease throughout both lungs. Likely layering bilateral pleural effusions are also present. Mild cardiomegaly. No pneumothorax. IMPRESSION: Interval placement of a right IJ approach central venous catheter, which terminates in the lower SVC. No pneumothorax. Otherwise, little  significant interval change to the lungs. Electronically Signed   By: Rogelia Myers M.D.   On: 09/05/2024 16:32   DG CHEST PORT 1 VIEW Result Date: 09/05/2024 CLINICAL DATA:  63 year old female with respiratory failure. Intubated. EXAM: PORTABLE CHEST 1 VIEW COMPARISON:  Portable chest yesterday and earlier. FINDINGS: Portable AP semi upright view at 0728 hours. Endotracheal tube tip at the level the clavicles. Enteric tube courses to the abdomen, tip not included. Pacer/resuscitation pads remain in place. Stable lung volumes and visible mediastinal contours with ongoing diffuse pulmonary opacity, veiling and confluent bibasilar opacity which obscures the diaphragm. Vague but confluent symmetric lung opacity elsewhere. Marked progression compared to portable chest on 09/03/2024, ventilation not significantly changed from yesterday. No pneumothorax. Paucity of bowel gas. Stable visualized osseous structures. IMPRESSION: 1. Stable lines and tubes. 2. Extensive bilateral Pneumonia versus Edema versus ARDS without significant change from yesterday. Probable bilateral pleural effusions. Electronically Signed   By: VEAR Hurst M.D.   On: 09/05/2024 07:38   DG Abd Portable 1V Result Date: 09/05/2024 CLINICAL DATA:  Check gastric catheter placement EXAM: PORTABLE ABDOMEN - 1 VIEW COMPARISON:  Film from the previous day. FINDINGS: Gastric catheter is been advanced deeper into the stomach. Free air seen. IMPRESSION: Gastric catheter within the stomach. Electronically Signed   By: Oneil Devonshire M.D.   On: 09/05/2024 01:28   DG Abd 1 View Result Date: 09/04/2024 CLINICAL DATA:  Check gastric catheter placement EXAM: ABDOMEN - 1 VIEW COMPARISON:  None Available. FINDINGS: Gastric catheter is noted within the stomach. The proximal side port is felt to lie within the distal esophagus. This should be advanced several cm deeper and stomach. IMPRESSION: Gastric catheter as described. This should be advanced deeper into the  stomach. Electronically Signed   By: Oneil Devonshire M.D.   On: 09/04/2024 22:16   DG CHEST PORT 1 VIEW Result Date: 09/04/2024 EXAM: 1 VIEW XRAY OF THE CHEST 09/04/2024 05:35:18 PM COMPARISON: 09/04/2024 CLINICAL HISTORY: Endotracheally intubated; MD approved xrays at bedside. FINDINGS: LINES, TUBES AND DEVICES: Endotracheal tube in place with tip 4.3 cm above the carina. Enteric tube with tip and sidehole overlying the lower esophagus, recommend advancement. External cardiac pacing pads on lower chest. LUNGS AND PLEURA: Hazy airspace opacities throughout the lungs are increased, likely representing multifocal pneumonia versus pulmonary edema. Bilateral pleural effusions. HEART AND MEDIASTINUM: No acute abnormality of the cardiac and mediastinal silhouettes. BONES AND SOFT TISSUES: No acute osseous abnormality. IMPRESSION: 1. Hazy airspace opacities throughout the lungs, likely representing multifocal pneumonia versus pulmonary edema. 2. Bilateral pleural effusions. 3. Enteric tube with tip and sidehole overlying the lower esophagus, recommend advancement. Electronically signed by: Donnice Mania MD 09/04/2024 07:31 PM EDT RP Workstation: [id number]   DG Abd 1 View Result Date:  09/04/2024 EXAM: 1 VIEW XRAY OF THE ABDOMEN 09/04/2024 05:35:18 PM COMPARISON: 09/04/2024 CLINICAL HISTORY: Endotracheally intubated; MD approved xrays at bedside. FINDINGS: LINES, TUBES AND DEVICES: Enteric tube in distal esophagus. Right femoral vascular catheter in place. BOWEL: Paucity of gas throughout the abdomen. Gaseous distension of the stomach similar to prior study. SOFT TISSUES: No opaque urinary calculi. BONES: No acute osseous abnormality. IMPRESSION: 1. Gaseous distension of the stomach, similar to prior study. 2. Enteric tube with tip and side hole overlying the lower esophagus. Electronically signed by: Donnice Mania MD 09/04/2024 07:28 PM EDT RP Workstation: [id number]   EEG adult Result Date: 09/04/2024 Shelton Arlin KIDD, MD     09/04/2024  5:32 PM Patient Name: MCKINZE POIRIER MRN: [id number] Epilepsy Attending: Arlin KIDD Shelton Referring Physician/Provider: Toma Matas, MD Date: 09/04/2024 Duration: 22.19 mins Patient history: 63yo F with ams getting eeg to evaluate for seizure Level of alertness: Awake AEDs during EEG study: None Technical aspects: This EEG study was done with scalp electrodes positioned according to the 10-20 International system of electrode placement. Electrical activity was reviewed with band pass filter of 1-70Hz , sensitivity of 7 uV/mm, display speed of 34mm/sec with a 60Hz  notched filter applied as appropriate. EEG data were recorded continuously and digitally stored.  Video monitoring was available and reviewed as appropriate. Description: EEG showed continuous generalized 3 to 6 Hz theta-delta slowing. Hyperventilation and photic stimulation were not performed.   ABNORMALITY - Continuous slow, generalized IMPRESSION: This study is suggestive of moderate diffuse encephalopathy. No seizures or epileptiform discharges were seen throughout the recording. Priyanka O Yadav   VAS US  LOWER EXTREMITY VENOUS (DVT) Result Date: 09/04/2024  Lower Venous DVT Study Patient Name:  KATERRA INGMAN  Date of Exam:   09/04/2024 Medical Rec #: [id number]           Accession #:    [id number] Date of Birth: [birth year]           Patient Gender: F Patient Age:   64 years Exam Location:  [facility] Procedure:      VAS US  LOWER EXTREMITY VENOUS (DVT) Referring Phys: TORIBIO SHARPS --------------------------------------------------------------------------------  Indications: Edema.  Limitations: Poor ultrasound/tissue interface and line. Comparison Study: No previous exams Performing Technologist: Jody Hill RVT, RDMS  Examination Guidelines: A complete evaluation includes B-mode imaging, spectral Doppler, color Doppler, and power Doppler as needed of all accessible portions of each vessel. Bilateral testing is  considered an integral part of a complete examination. Limited examinations for reoccurring indications may be performed as noted. The reflux portion of the exam is performed with the patient in reverse Trendelenburg.  +-----+---------------+---------+-----------+----------+--------------+ RIGHTCompressibilityPhasicitySpontaneityPropertiesThrombus Aging +-----+---------------+---------+-----------+----------+--------------+ CFV                                               not visualized +-----+---------------+---------+-----------+----------+--------------+ CFV/SFJ not visualized due to central and arterial line placement.  +---------+---------------+---------+-----------+----------+--------------+ LEFT     CompressibilityPhasicitySpontaneityPropertiesThrombus Aging +---------+---------------+---------+-----------+----------+--------------+ CFV      Full           No       Yes                                 +---------+---------------+---------+-----------+----------+--------------+ SFJ      Full                                                        +---------+---------------+---------+-----------+----------+--------------+  FV Prox  Full           No       Yes                                 +---------+---------------+---------+-----------+----------+--------------+ FV Mid   Full           No       Yes                                 +---------+---------------+---------+-----------+----------+--------------+ FV DistalFull           No       Yes                                 +---------+---------------+---------+-----------+----------+--------------+ PFV      Full                                                        +---------+---------------+---------+-----------+----------+--------------+ POP      Full           No       Yes                                 +---------+---------------+---------+-----------+----------+--------------+ PTV       Full                                                        +---------+---------------+---------+-----------+----------+--------------+ PERO     Full                                                        +---------+---------------+---------+-----------+----------+--------------+ pulsatile doppler waveforms    Summary: LEFT: - There is no evidence of deep vein thrombosis in the lower extremity.  - No cystic structure found in the popliteal fossa. Diffuse subcutaneous edema throughout lower extremity.  *See table(s) above for measurements and observations. Electronically signed by Penne Colorado MD on 09/04/2024 at 4:30:43 PM.    Final    DG Abd Portable 1V Result Date: 09/04/2024 EXAM: 1 VIEW XRAY OF THE ABDOMEN 09/04/2024 02:26:00 PM COMPARISON: CT abdomen and pelvis dated 08/08/2024. CLINICAL HISTORY: Encounter for feeding tube placement. Post intubation and feeding tube. FINDINGS: LINES, TUBES AND DEVICES: Enteric tube tip projects over the stomach. Side hole projects over the lower esophagus. Right inferior femoral approach venous catheter tip projects over the right L5 level. BOWEL: Gas filled mildly dilated stomach and a few loops of right-sided bowel loops are present. SOFT TISSUES: No opaque urinary calculi. BONES: No acute osseous abnormality. IMPRESSION: 1. Enteric tube tip projects over the stomach, with side hole projecting over the lower esophagus. Recommend advancing. Electronically signed by: Manford Cummins MD 09/04/2024 03:12 PM EDT RP Workstation:  [id number]   DG CHEST PORT 1 VIEW Result Date: 09/04/2024 CLINICAL DATA:  Status post intubation EXAM: PORTABLE CHEST 1 VIEW COMPARISON:  September 04, 2024 abdominal radiograph and September 03, 2024 chest radiograph FINDINGS: Interval placement of endotracheal tube with tip at the level of the trachea. Recommend retraction 2-3 cm. Interval placement of enteric tube with tip outside the field of view with side port near the distal esophagus,  better seen on same day abdominal radiograph. Recommend advancement. Partially imaged linear radiodensity projecting over the left hemithorax and mediastinum, possibly representing overlying lead or support device external to the patient. Low lung volumes with multifocal bilateral airspace opacities, greatest at the right lung base, possibly related to a pulmonary edema and/or infection. Unchanged cardiomediastinal silhouette. No acute osseous findings. IMPRESSION: 1. Endotracheal tube with tip at the level of the carina. Recommend retraction 2-3 cm. 2. Enteric tube with tip better seen on prior same day abdominal radiograph but side port in the distal esophagus. Recommend advancement. 3. Additional overlying radiodensities, favored to represent external leads or support devices. 4. Multifocal pulmonary opacities, greatest at the right lung base, possibly representing multifocal pneumonia with possible underlying pulmonary edema. These results will be called to the ordering clinician or representative by the Radiologist Assistant, and communication documented in the PACS or [employer]. Electronically Signed   By: Michaeline Blanch M.D.   On: 09/04/2024 14:52   US  Abdomen Limited RUQ (LIVER/GB) Result Date: 09/04/2024 EXAM: Right Upper Quadrant Abdominal Ultrasound TECHNIQUE: Real-time ultrasonography of the right upper quadrant of the abdomen was performed. COMPARISON: CT abdomen and pelvis dated 08/08/2024. CLINICAL HISTORY: Cirrhosis (HCC) FINDINGS: LIVER: The liver demonstrates cirrhotic morphology with nodular hepatic contour. Portal vein is patent. Reversal of portal venous flow. BILIARY SYSTEM: No evidence of wall thickening. Moderate volume layering sludge. No cholelithiasis. Common bile duct measures 5 mm. OTHER: Moderate volume ascites. IMPRESSION: 1. Cirrhotic morphology with sequela of portal hypertension including moderate volume ascites and reversal of portal venous flow. 2. Gallbladder contains  moderate volume layering sludge. Electronically signed by: Manford Cummins MD 09/04/2024 02:49 PM EDT RP Workstation: [id number]   ECHOCARDIOGRAM COMPLETE Result Date: 09/04/2024    ECHOCARDIOGRAM REPORT   Patient Name:   ROZANNA CORMANY Date of Exam: 09/04/2024 Medical Rec #:  [id number]          Height:       63.0 in Accession #:    [id number]         Weight:       140.4 lb Date of Birth:  01/18/1961          BSA:          1.664 m Patient Age:    62 years           BP:           139/85 mmHg Patient Gender: F                  HR:           59 bpm. Exam Location:  Inpatient Procedure: 2D Echo, Color Doppler and Cardiac Doppler (Both Spectral and Color            Flow Doppler were utilized during procedure). Indications:    Cardiac Arrest i46.9  History:        Patient has no prior history of Echocardiogram examinations.  Sonographer:    Damien Senior RDCS Referring Phys: [phone] [facility]  Sonographer Comments: Zoll pads still in  place post arrest, no parasternals IMPRESSIONS  1. Left ventricular ejection fraction, by estimation, is 35 to 40%. The left ventricle has moderately decreased function. Paradoxical/abnormal septal motion after cardiac arrest possibly secondary to junctional escape. Left ventricular diastolic parameters are indeterminate.  2. Right ventricular systolic function is low normal. The right ventricular size is normal. There is normal pulmonary artery systolic pressure. The estimated right ventricular systolic pressure is 33.5 mmHg.  3. The mitral valve is grossly normal. Severe mitral valve regurgitation.  4. Tricuspid valve regurgitation is moderate to severe.  5. The aortic valve is grossly normal. Aortic valve regurgitation is not visualized.  6. The inferior vena cava is dilated in size with <50% respiratory variability, suggesting right atrial pressure of 15 mmHg. FINDINGS  Left Ventricle: Left ventricular ejection fraction, by estimation, is 35 to 40%. The left ventricle has moderately  decreased function. The left ventricle demonstrates regional wall motion abnormalities. The left ventricular internal cavity size was normal in size. Suboptimal image quality limits for assessment of left ventricular hypertrophy. Left ventricular diastolic parameters are indeterminate. Right Ventricle: The right ventricular size is normal. No increase in right ventricular wall thickness. Right ventricular systolic function is low normal. There is normal pulmonary artery systolic pressure. The tricuspid regurgitant velocity is 2.15 m/s,  and with an assumed right atrial pressure of 15 mmHg, the estimated right ventricular systolic pressure is 33.5 mmHg. Left Atrium: Left atrial size was normal in size. Right Atrium: Right atrial size was normal in size. Pericardium: There is no evidence of pericardial effusion. Mitral Valve: The mitral valve is grossly normal. Severe mitral valve regurgitation. Tricuspid Valve: The tricuspid valve is grossly normal. Tricuspid valve regurgitation is moderate to severe. Aortic Valve: The aortic valve is grossly normal. Aortic valve regurgitation is not visualized. Pulmonic Valve: The pulmonic valve was not assessed. Pulmonic valve regurgitation is not visualized. Aorta: The ascending aorta was not well visualized. Venous: The inferior vena cava is dilated in size with less than 50% respiratory variability, suggesting right atrial pressure of 15 mmHg. IAS/Shunts: No atrial level shunt detected by color flow Doppler. Additional Comments: Severe ascites is present.  LEFT VENTRICLE PLAX 2D LVOT diam:     2.10 cm LV SV:         29 LV SV Index:   18 LVOT Area:     3.46 cm  LV Volumes (MOD) LV vol d, MOD A4C: 85.2 ml LV vol s, MOD A4C: 50.6 ml LV SV MOD A4C:     85.2 ml RIGHT VENTRICLE RV S prime:     11.60 cm/s TAPSE (M-mode): 2.0 cm LEFT ATRIUM             Index        RIGHT ATRIUM           Index LA Vol (A2C):   47.3 ml 28.43 ml/m  RA Area:     17.10 cm LA Vol (A4C):   42.0 ml 25.24  ml/m  RA Volume:   44.40 ml  26.69 ml/m LA Biplane Vol: 48.0 ml 28.85 ml/m  AORTIC VALVE LVOT Vmax:   57.90 cm/s LVOT Vmean:  43.350 cm/s LVOT VTI:    0.085 m TRICUSPID VALVE TR Peak grad:   18.5 mmHg TR Vmax:        215.00 cm/s  SHUNTS Systemic VTI:  0.08 m Systemic Diam: 2.10 cm Aditya Sabharwal Electronically signed by Ria Commander Signature Date/Time: 09/04/2024/2:27:44 PM    Final  Assessment and Plan:  63 year old female with decompensated alcohol-induced cirrhosis admitted with severe sepsis and progressive multi-organ failure with PEA arrest on Sept 16.     Septic shock with DIC - Still requiring three pressors currently - On zosyn  3.375 g q8, vancomycin  d/c'd - Bicarb/D5 gtt - Lactate remains significantly elevate around 9, but pH has improved - GNRs in blood making cellulitis less likely cause of infection.  SBP less likely based on ascitic fluid analysis (although results may be influenced by prior antibiotics - Management per PCCM - Stress dose hydrocortisone  100 mg IV q8   Decompensated cirrhosis - Severe liver dysfunction with underlying portal hypertension impairing her ability to fight infection and contributing to multiorgan failure, encephalopathy, coagulopathy and lactic acidosis - Patient at very risk for GI bleeding given cirrhosis and DIC with severe coagulopathy and thrombocytopenia, but no evidence of bleeding yet - Continue PPI BID, octreotide  - No role for prednisolone  for alcoholic hepatitis; patient had already been treated and had high Lille score prior to admission, prompting cessation of prednisolone    Acute renal failure, secondary to septic shock; likely HRS but cannot make this diagnosis in setting of septic shock - Nephrology  following, started CRRT 9/17 - Scheduled albumin  25 g q8hr  - Octreotide  100 mcg q8 hr   Encephalopathy, likely metabolic +/- hepatic - EEG with no seizure activity - Continue lactulose  30 g TID and rifaximin  550 mg  BID   Respiratory failure, suspect ARDS - Vent settings per PCCM    Glendia FORBES Holt, MD  09/06/2024, 2:16 PM [facility]

## 2024-09-06 NOTE — Progress Notes (Signed)
 Pharmacy Antibiotic Note  Rachel Lucas is a 63 y.o. female admitted on 09/03/2024 with septic shock and cellulitis.  Pharmacy has been consulted for Vancomycin  dosing.  WBC 17.9 (up), L acid 7.0 (down)   Plan: Vancomycin  1000 mg q24h while on CRRT On Zosyn  3.375 g q8h EI, line access okay   Height: 5' 3 (160 cm) Weight: 76.6 kg (168 lb 14 oz) IBW/kg (Calculated) : 52.4  Temp (24hrs), Avg:97.8 F (36.6 C), Min:95.7 F (35.4 C), Max:99.7 F (37.6 C)  Recent Labs  Lab 09/03/24 1320 09/03/24 2130 09/04/24 0415 09/04/24 1130 09/04/24 1328 09/04/24 1347 09/04/24 1836 09/05/24 0320 09/06/24 0445  WBC 3.3*  --  5.8  --  6.4  --   --  12.0* 17.9*  CREATININE 2.19*  --  2.43*  --  2.60*  --  2.61* 2.81* 2.01*  2.20*  LATICACIDVEN  --    < > 5.8* 6.1*  --  >9.0* >9.0* 9.0* 7.0*   < > = values in this interval not displayed.    Estimated Creatinine Clearance: 29.2 mL/min (A) (by C-G formula based on SCr of 2.01 mg/dL (H)).    Allergies  Allergen Reactions   Levofloxacin Other (See Comments)   Sulfonamide Derivatives Palpitations    Antimicrobials this admission: 9/15 linezolid >> 9/16 9/17 vancomycin  >>  9/15 zosyn >>  Microbiology results: 9/15 blood>> GNR 1/4 pending identification  9/15 resp panel neg Strep urine Ag neg  Legionella Ag pending  MRSA nares neg   Thank you for allowing pharmacy to be a part of this patient's care.  Rankin Sams 09/06/2024 11:28 AM

## 2024-09-06 NOTE — Progress Notes (Signed)
 OT Cancellation Note  Patient Details Name: Rachel Lucas MRN: [id number] DOB: Jun 18, 1961   Cancelled Treatment:    Reason Eval/Treat Not Completed: Patient not medically ready (OT signing off at this time. Please reorder when able to participate with OT. thanks.)  [facility] 09/06/2024, 10:51 AM Kreg Sink, OT/L   Acute OT Clinical Specialist Acute Rehabilitation Services Pager [phone] Office [phone]

## 2024-09-06 NOTE — Procedures (Signed)
 PROCEDURE SUMMARY:  Successful US  guided paracentesis from right lateral abdomen.  Yielded 1.3 liters of light amber/orange fluid.  No immediate complications.  Pt tolerated well.   Specimen was sent for labs.  EBL < 5mL  Solmon Selmer Ku PA-C 09/06/2024 1:44 PM

## 2024-09-06 NOTE — Consult Note (Signed)
 [facility] [place]  ASSESSMENT / PLAN: 63 y.o. female with change in vascular exam in left lower extremity, but with brisk doppler flow in left posterior tibial artery. The leg has ecchymosis and bullae which have been present for several days. Sedated, and not able to participate in neurologic exam on my exam. Low concern for acute limb ischemia.   CHIEF COMPLAINT: change in left leg exam  HISTORY OF PRESENT ILLNESS: Rachel Lucas is a 64 y.o. female admitted to the critical care service for septic shock, hepatorenal syndrome.  She suffered cardiac arrest and achieved ROSC earlier this admission.  She remains intubated and sedated in multisystem organ failure.  The bedside nurse noted a change in her left lower extremity vascular exam today, and so vascular consultation was requested.  Patient has skin changes concerning for cellulitis which was thought to be the possible source of her sepsis.  She has ecchymosis and bullae across the left leg.  On my evaluation, the patient was intubated and sedated.  She was not able to participate in exam.  Her husband and a friend were at the bedside.  Past Medical History:  Diagnosis Date   Hypothyroidism    Thyroid  disease    Von Willebrand disease (HCC)     Past Surgical History:  Procedure Laterality Date   CYST REMOVAL NECK  1995   IR PARACENTESIS  09/06/2024    Family History  Problem Relation Age of Onset   Asthma Mother    Eczema Mother    Heart Problems Father     Social History   Socioeconomic History   Marital status: Married    Spouse name: Clarrissa Shimkus   Number of children: 0   Years of education: 12   Highest education level: Bachelor's degree (e.g., BA, AB, BS)  Occupational History   Not on file  Tobacco Use   Smoking status: Former    Current packs/day: 0.00    Average packs/day: 0.2 packs/day for 10.0 years (2.0 ttl pk-yrs)    Types: Cigarettes    Start date: 12/21/1979    Quit date:  12/20/1989    Years since quitting: 34.7   Smokeless tobacco: Not on file   Tobacco comments:    Pt also notes passive exposure- father smoked in house growing up.   Vaping Use   Vaping status: Never Used  Substance and Sexual Activity   Alcohol use: Not Currently   Drug use: Never   Sexual activity: Yes    Birth control/protection: None  Other Topics Concern   Not on file  Social History Narrative   Not on file   Social Drivers of Health   Financial Resource Strain: Not on file  Food Insecurity: No Food Insecurity (09/03/2024)   Hunger Vital Sign    Worried About Running Out of Food in the Last Year: Never true    Ran Out of Food in the Last Year: Never true  Transportation Needs: No Transportation Needs (09/03/2024)   PRAPARE - Administrator, Civil Service (Medical): No    Lack of Transportation (Non-Medical): No  Physical Activity: Not on file  Stress: Not on file  Social Connections: Patient Declined (09/03/2024)   Social Connection and Isolation Panel    Frequency of Communication with Friends and Family: Patient declined    Frequency of Social Gatherings with Friends and Family: Patient declined    Attends Religious Services: Patient declined    Active Member of Clubs or  Organizations: Patient declined    Attends Banker Meetings: Patient declined    Marital Status: Patient declined  Intimate Partner Violence: Not At Risk (09/03/2024)   Humiliation, Afraid, Rape, and Kick questionnaire    Fear of Current or Ex-Partner: No    Emotionally Abused: No    Physically Abused: No    Sexually Abused: No    Allergies  Allergen Reactions   Levofloxacin Other (See Comments)   Sulfonamide Derivatives Palpitations    Current Facility-Administered Medications  Medication Dose Route Frequency Provider Last Rate Last Admin   0.9 %  sodium chloride  infusion (Manually program via Guardrails IV Fluids)   Intravenous Once Shitarev, Dimitry, MD       0.9 %   sodium chloride  infusion (Manually program via Guardrails IV Fluids)   Intravenous Once Ilah, Stephanie M, PA-C       albumin  human 25 % solution 25 g  25 g Intravenous Q6H Shitarev, Dimitry, MD       artificial tears (LACRILUBE) ophthalmic ointment   Both Eyes Q4H PRN Smith, Joshua C, NP       Chlorhexidine  Gluconate Cloth 2 % PADS 6 each  6 each Topical Daily Claudene Toribio BROCKS, MD   6 each at 09/05/24 2258   ciprofloxacin -dexamethasone  (CIPRODEX ) 0.3-0.1 % OTIC (EAR) suspension 4 drop  4 drop Left EAR BID Shitarev, Dimitry, MD   4 drop at 09/06/24 1753   docusate (COLACE) 50 MG/5ML liquid 100 mg  100 mg Per Tube BID Shitarev, Dimitry, MD   100 mg at 09/05/24 1949   docusate (COLACE) 50 MG/5ML liquid 100 mg  100 mg Per Tube BID PRN Geronimo Amel, MD       EPINEPHrine  (ADRENALIN ) 5 mg in NS 250 mL (0.02 mg/mL) premix infusion  1-5 mcg/min Intravenous Titrated Babcock, Peter E, NP 6 mL/hr at 09/06/24 1700 2 mcg/min at 09/06/24 1700   feeding supplement (VITAL 1.5 CAL) liquid 1,000 mL  1,000 mL Per Tube Continuous Ramaswamy, Murali, MD 20 mL/hr at 09/06/24 1700 Infusion Verify at 09/06/24 1700   fentaNYL  (SUBLIMAZE ) bolus via infusion 25-100 mcg  25-100 mcg Intravenous Q15 min PRN Shitarev, Dimitry, MD   100 mcg at 09/06/24 1107   fentaNYL  (SUBLIMAZE ) injection 25-50 mcg  25-50 mcg Intravenous Once Shitarev, Dimitry, MD       fentaNYL  in NS (86mcg/ml) infusion-PREMIX  0-400 mcg/hr Intravenous Continuous Shitarev, Dimitry, MD 30 mL/hr at 09/06/24 1700 300 mcg/hr at 09/06/24 1700   folic acid  (FOLVITE ) tablet 1 mg  1 mg Per Tube Daily Geronimo Amel, MD   1 mg at 09/05/24 0908   heparin  injection 1,000-6,000 Units  1,000-6,000 Units CRRT PRN Jerrye Katheryn BROCKS, MD       hydrocortisone  sodium succinate  (SOLU-CORTEF ) 100 MG injection 100 mg  100 mg Intravenous Q8H Paliwal, Aditya, MD   100 mg at 09/06/24 1217   [START ON 09/07/2024] influenza vac split trivalent PF (FLUZONE ) injection 0.5 mL   0.5 mL Intramuscular Tomorrow-1000 Claudene Toribio BROCKS, MD       lactulose  (CHRONULAC ) 10 GM/15ML solution 30 g  30 g Per Tube TID Geronimo Amel, MD   30 g at 09/06/24 1538   levothyroxine  (SYNTHROID ) tablet 88 mcg  88 mcg Per Tube QAC breakfast Ramaswamy, Murali, MD   88 mcg at 09/06/24 0600   midazolam  (VERSED ) injection 1-2 mg  1-2 mg Intravenous Q1H PRN Shitarev, Dimitry, MD       norepinephrine  (LEVOPHED ) 16 mg in 250mL (  0.064 mg/mL) premix infusion  0-40 mcg/min Intravenous Continuous Geronimo Amel, MD 2.81 mL/hr at 09/06/24 1700 3 mcg/min at 09/06/24 1700   ondansetron  (ZOFRAN ) injection 4 mg  4 mg Intravenous Q6H PRN Haze Led, MD   4 mg at 09/04/24 0406   Oral care mouth rinse  15 mL Mouth Rinse PRN Claudene Toribio BROCKS, MD       Oral care mouth rinse  15 mL Mouth Rinse Q2H Paliwal, Aditya, MD   15 mL at 09/06/24 1704   pantoprazole  (PROTONIX ) injection 40 mg  40 mg Intravenous Q12H Smith, Daniel C, MD   40 mg at 09/06/24 1004   piperacillin -tazobactam (ZOSYN ) IVPB 3.375 g  3.375 g Intravenous Q8H Simon Lavonia SAILOR, MD 12.5 mL/hr at 09/06/24 1700 Infusion Verify at 09/06/24 1700   pneumococcal 20-valent conjugate vaccine (PREVNAR 20) injection 0.5 mL  0.5 mL Intramuscular Prior to discharge Claudene Toribio BROCKS, MD       polyethylene glycol (MIRALAX  / GLYCOLAX ) packet 17 g  17 g Per Tube Daily Shitarev, Dimitry, MD       polyethylene glycol (MIRALAX  / GLYCOLAX ) packet 17 g  17 g Per Tube Daily PRN Geronimo Amel, MD       prismasol  BGK 4/2.5 infusion   CRRT Continuous Jerrye Katheryn BROCKS, MD 1,200 mL/hr at 09/06/24 1556 New Bag at 09/06/24 1556   prismasol  BGK 4/2.5 infusion   CRRT Continuous Jerrye Katheryn BROCKS, MD 400 mL/hr at 09/06/24 0730 New Bag at 09/06/24 0730   prismasol  BGK 4/2.5 infusion   CRRT Continuous Jerrye Katheryn BROCKS, MD 400 mL/hr at 09/06/24 0730 New Bag at 09/06/24 0730   rifaximin  (XIFAXAN ) tablet 550 mg  550 mg Per Tube BID Geronimo Amel, MD   550 mg at 09/05/24 1840   sodium  chloride flush (NS) 0.9 % injection 10-40 mL  10-40 mL Intracatheter Q12H Claudene Toribio BROCKS, MD   10 mL at 09/06/24 0936   sodium chloride  flush (NS) 0.9 % injection 10-40 mL  10-40 mL Intracatheter PRN Claudene Toribio BROCKS, MD       thiamine  (VITAMIN B1) tablet 100 mg  100 mg Per Tube Daily Geronimo Amel, MD   100 mg at 09/05/24 0908   vancomycin  (VANCOCIN ) IVPB 1000 mg/200 mL premix  1,000 mg Intravenous Q24H Geronimo Amel, MD   Stopped at 09/06/24 1322   vasopressin  (PITRESSIN) 20 Units in 100 mL (0.2 unit/mL) infusion-*FOR SHOCK*  0.04 Units/min Intravenous Continuous Paliwal, Aditya, MD 12 mL/hr at 09/06/24 1700 0.04 Units/min at 09/06/24 1700    PHYSICAL EXAM Vitals:   09/06/24 1718 09/06/24 1719 09/06/24 1720 09/06/24 1721  BP:      Pulse: 63 64 63 64  Resp: (!) 24 (!) 24 (!) 24 (!) 24  Temp: (!) 97.3 F (36.3 C) (!) 97.3 F (36.3 C) (!) 97.3 F (36.3 C) (!) 97.3 F (36.3 C)  TempSrc:      SpO2: 100% 100% 100% 100%  Weight:      Height:       Intubated and sedated.  RASS -4.  GCS 3 T Regular rate and rhythm Equal chest rise.  Intubated and mechanically ventilated. Multiple pressors infusing including epinephrine , norepinephrine , vasopressin . CRRT in process Left lower extremity with scattered patches of ecchymosis.  Bullae across the left lower extremity with serous drainage Brisk, nearly triphasic Doppler signal in the posterior tibial artery on the left  PERTINENT LABORATORY AND RADIOLOGIC DATA  Most recent CBC    Latest Ref Rng &  Units 09/06/2024   10:34 AM 09/06/2024    4:45 AM 09/05/2024    3:20 AM  CBC  WBC 4.0 - 10.5 K/uL  17.9  12.0   Hemoglobin 12.0 - 15.0 g/dL 8.2  7.6  8.0    8.5   Hematocrit 36.0 - 46.0 % 24.0  21.8  23.4    25.0   Platelets 150 - 400 K/uL  49  44      Most recent CMP    Latest Ref Rng & Units 09/06/2024   10:34 AM 09/06/2024    4:45 AM 09/05/2024    3:20 AM  CMP  Glucose 70 - 99 mg/dL 70 - 99 mg/dL  887    886  749   BUN 6 - 20  mg/dL 6 - 20 mg/dL  19    19  24    Creatinine 0.44 - 1.00 mg/dL 9.55 - 8.99 mg/dL  7.98    7.79  7.18   Sodium 135 - 145 mmol/L 133  130    131  123    124   Potassium 3.5 - 5.1 mmol/L 3.7  3.7    3.7  3.8    3.8   Chloride 98 - 111 mmol/L 98 - 111 mmol/L  88    89  85   CO2 22 - 32 mmol/L 22 - 32 mmol/L  24    22  19    Calcium  8.9 - 10.3 mg/dL 8.9 - 89.6 mg/dL  7.6    7.6  8.0   Total Protein 6.5 - 8.1 g/dL  5.5  5.6   Total Bilirubin 0.0 - 1.2 mg/dL  88.0  9.8   Alkaline Phos 38 - 126 U/L  36  30   AST 15 - 41 U/L  82  100   ALT 0 - 44 U/L  38  38     Renal function Estimated Creatinine Clearance: 29.2 mL/min (A) (by C-G formula based on SCr of 2.01 mg/dL (H)).  Debby SAILOR. Magda, MD [facility] Phone Number: [phone] 09/06/2024 6:35 PM   Total time spent on preparing this encounter including chart review, data review, collecting history, examining the patient, and coordinating care: 60 min  Portions of this report may have been transcribed using voice recognition software.  Every effort has been made to ensure accuracy; however, inadvertent computerized transcription errors may still be present.

## 2024-09-07 ENCOUNTER — Inpatient Hospital Stay (HOSPITAL_COMMUNITY)

## 2024-09-07 DIAGNOSIS — R2243 Localized swelling, mass and lump, lower limb, bilateral: Secondary | ICD-10-CM | POA: Diagnosis not present

## 2024-09-07 DIAGNOSIS — K7460 Unspecified cirrhosis of liver: Secondary | ICD-10-CM | POA: Diagnosis not present

## 2024-09-07 DIAGNOSIS — K766 Portal hypertension: Secondary | ICD-10-CM | POA: Diagnosis not present

## 2024-09-07 DIAGNOSIS — R6521 Severe sepsis with septic shock: Secondary | ICD-10-CM | POA: Diagnosis not present

## 2024-09-07 DIAGNOSIS — R7881 Bacteremia: Secondary | ICD-10-CM

## 2024-09-07 DIAGNOSIS — A419 Sepsis, unspecified organism: Secondary | ICD-10-CM | POA: Diagnosis not present

## 2024-09-07 DIAGNOSIS — D72829 Elevated white blood cell count, unspecified: Secondary | ICD-10-CM | POA: Diagnosis not present

## 2024-09-07 DIAGNOSIS — K7469 Other cirrhosis of liver: Secondary | ICD-10-CM | POA: Diagnosis not present

## 2024-09-07 LAB — TROPONIN I (HIGH SENSITIVITY): Troponin I (High Sensitivity): 327 ng/L (ref <18)

## 2024-09-07 LAB — BPAM RBC
Blood Product Expiration Date: 202510132359
Blood Product Expiration Date: 202510132359
Blood Product Expiration Date: 202510132359
ISSUE DATE / TIME: 202509161505
Unit Type and Rh: 5100
Unit Type and Rh: 5100
Unit Type and Rh: 5100

## 2024-09-07 LAB — RENAL FUNCTION PANEL
Albumin: 4.6 g/dL (ref 3.5–5.0)
Albumin: 4.6 g/dL (ref 3.5–5.0)
Anion gap: 16 — ABNORMAL HIGH (ref 5–15)
Anion gap: 16 — ABNORMAL HIGH (ref 5–15)
BUN: 14 mg/dL (ref 6–20)
BUN: 12 mg/dL (ref 6–20)
CO2: 20 mmol/L — ABNORMAL LOW (ref 22–32)
CO2: 20 mmol/L — ABNORMAL LOW (ref 22–32)
Calcium: 8.3 mg/dL — ABNORMAL LOW (ref 8.9–10.3)
Calcium: 8.5 mg/dL — ABNORMAL LOW (ref 8.9–10.3)
Chloride: 99 mmol/L (ref 98–111)
Chloride: 101 mmol/L (ref 98–111)
Creatinine, Ser: 1.51 mg/dL — ABNORMAL HIGH (ref 0.44–1.00)
Creatinine, Ser: 1.31 mg/dL — ABNORMAL HIGH (ref 0.44–1.00)
GFR, Estimated: 39 mL/min — ABNORMAL LOW (ref >60)
GFR, Estimated: 47 mL/min — ABNORMAL LOW (ref >60)
Glucose, Bld: 85 mg/dL (ref 70–99)
Glucose, Bld: 117 mg/dL — ABNORMAL HIGH (ref 70–99)
Phosphorus: 3.2 mg/dL (ref 2.5–4.6)
Phosphorus: 2.5 mg/dL (ref 2.5–4.6)
Potassium: 3.9 mmol/L (ref 3.5–5.1)
Potassium: 3.8 mmol/L (ref 3.5–5.1)
Sodium: 135 mmol/L (ref 135–145)
Sodium: 137 mmol/L (ref 135–145)

## 2024-09-07 LAB — TYPE AND SCREEN
ABO/RH(D): O POS
Antibody Screen: NEGATIVE
Unit division: 0
Unit division: 0
Unit division: 0

## 2024-09-07 LAB — COOXEMETRY PANEL
Carboxyhemoglobin: 2.8 % — ABNORMAL HIGH (ref 0.5–1.5)
Carboxyhemoglobin: 2.2 % — ABNORMAL HIGH (ref 0.5–1.5)
Carboxyhemoglobin: 2.4 % — ABNORMAL HIGH (ref 0.5–1.5)
Carboxyhemoglobin: 2.2 % — ABNORMAL HIGH (ref 0.5–1.5)
Methemoglobin: 0.9 % (ref 0.0–1.5)
Methemoglobin: <0.7 % (ref 0.0–1.5)
Methemoglobin: 1.1 % (ref 0.0–1.5)
Methemoglobin: <0.7 % (ref 0.0–1.5)
O2 Saturation: 75.9 %
O2 Saturation: 78.7 %
O2 Saturation: 73.6 %
O2 Saturation: 85.3 %
Total hemoglobin: 7.6 g/dL — ABNORMAL LOW (ref 12.0–16.0)
Total hemoglobin: 7.3 g/dL — ABNORMAL LOW (ref 12.0–16.0)
Total hemoglobin: 7.4 g/dL — ABNORMAL LOW (ref 12.0–16.0)
Total hemoglobin: 7.5 g/dL — ABNORMAL LOW (ref 12.0–16.0)

## 2024-09-07 LAB — CBC
HCT: 20.7 % — ABNORMAL LOW (ref 36.0–46.0)
Hemoglobin: 7.2 g/dL — ABNORMAL LOW (ref 12.0–15.0)
MCH: 36.9 pg — ABNORMAL HIGH (ref 26.0–34.0)
MCHC: 34.8 g/dL (ref 30.0–36.0)
MCV: 106.2 fL — ABNORMAL HIGH (ref 80.0–100.0)
Platelets: 52 K/uL — ABNORMAL LOW (ref 150–400)
RBC: 1.95 MIL/uL — ABNORMAL LOW (ref 3.87–5.11)
WBC: 20.4 K/uL — ABNORMAL HIGH (ref 4.0–10.5)
nRBC: 0 % (ref 0.0–0.2)

## 2024-09-07 LAB — PREPARE PLATELET PHERESIS: Unit division: 0

## 2024-09-07 LAB — MAGNESIUM: Magnesium: 2.5 mg/dL — ABNORMAL HIGH (ref 1.7–2.4)

## 2024-09-07 LAB — CALCIUM, IONIZED: Calcium, Ionized, Serum: 4.3 mg/dL — ABNORMAL LOW (ref 4.5–5.6)

## 2024-09-07 LAB — POCT I-STAT 7, (LYTES, BLD GAS, ICA,H+H)
Acid-base deficit: 1 mmol/L (ref 0.0–2.0)
Bicarbonate: 23.3 mmol/L (ref 20.0–28.0)
Calcium, Ion: 1.02 mmol/L — ABNORMAL LOW (ref 1.15–1.40)
HCT: 22 % — ABNORMAL LOW (ref 36.0–46.0)
Hemoglobin: 7.5 g/dL — ABNORMAL LOW (ref 12.0–15.0)
O2 Saturation: 99 %
Potassium: 3.9 mmol/L (ref 3.5–5.1)
Sodium: 135 mmol/L (ref 135–145)
TCO2: 24 mmol/L (ref 22–32)
pCO2 arterial: 36.5 mmHg (ref 32–48)
pH, Arterial: 7.412 (ref 7.35–7.45)
pO2, Arterial: 133 mmHg — ABNORMAL HIGH (ref 83–108)

## 2024-09-07 LAB — GLUCOSE, CAPILLARY
Glucose-Capillary: 79 mg/dL (ref 70–99)
Glucose-Capillary: 60 mg/dL — ABNORMAL LOW (ref 70–99)
Glucose-Capillary: 153 mg/dL — ABNORMAL HIGH (ref 70–99)
Glucose-Capillary: 106 mg/dL — ABNORMAL HIGH (ref 70–99)
Glucose-Capillary: 118 mg/dL — ABNORMAL HIGH (ref 70–99)
Glucose-Capillary: 128 mg/dL — ABNORMAL HIGH (ref 70–99)
Glucose-Capillary: 122 mg/dL — ABNORMAL HIGH (ref 70–99)

## 2024-09-07 LAB — BPAM PLATELET PHERESIS
Blood Product Expiration Date: 202509192359
ISSUE DATE / TIME: 202509181216
Unit Type and Rh: 9500

## 2024-09-07 LAB — PHOSPHORUS: Phosphorus: 3.2 mg/dL (ref 2.5–4.6)

## 2024-09-07 LAB — PROTIME-INR
INR: 3.2 — ABNORMAL HIGH (ref 0.8–1.2)
Prothrombin Time: 34.2 s — ABNORMAL HIGH (ref 11.4–15.2)

## 2024-09-07 LAB — LACTIC ACID, PLASMA: Lactic Acid, Venous: 6.6 mmol/L (ref 0.5–1.9)

## 2024-09-07 MED ORDER — DEXTROSE 50 % IV SOLN
12.5000 g | INTRAVENOUS | Status: AC
Start: 2024-09-07 — End: 2024-09-07

## 2024-09-07 MED ORDER — DEXTROSE-SODIUM CHLORIDE 5-0.9 % IV SOLN: INTRAVENOUS | Status: DC

## 2024-09-07 MED ORDER — ALBUMIN HUMAN 25 % IV SOLN
25.0000 g | Freq: Three times a day (TID) | INTRAVENOUS | Status: DC
  Administered 2024-09-07 – 2024-09-10 (×9): 25 g via INTRAVENOUS
  Filled 2024-09-07 (×9): qty 100

## 2024-09-07 MED ORDER — DEXTROSE 50 % IV SOLN
INTRAVENOUS | Status: AC
  Administered 2024-09-07: 12.5 g via INTRAVENOUS
  Filled 2024-09-07: qty 50

## 2024-09-07 MED ORDER — OFLOXACIN 0.3 % OP SOLN
5.0000 [drp] | Freq: Every day | OPHTHALMIC | Status: DC
Start: 2024-09-07 — End: 2024-09-08
  Administered 2024-09-07 – 2024-09-08 (×2): 5 [drp] via OTIC
  Filled 2024-09-07: qty 5

## 2024-09-07 MED ORDER — CALCIUM GLUCONATE-NACL 1-0.675 GM/50ML-% IV SOLN
1.0000 g | Freq: Once | INTRAVENOUS | Status: AC
Start: 2024-09-07 — End: 2024-09-07
  Administered 2024-09-07: 1000 mg via INTRAVENOUS
  Filled 2024-09-07: qty 50

## 2024-09-07 MED ORDER — FENTANYL CITRATE PF 50 MCG/ML IJ SOSY
50.0000 ug | PREFILLED_SYRINGE | INTRAMUSCULAR | Status: DC | PRN
  Administered 2024-09-07: 50 ug via INTRAVENOUS
  Administered 2024-09-08: 100 ug via INTRAVENOUS
  Administered 2024-09-09 (×2): 50 ug via INTRAVENOUS
  Administered 2024-09-10: 150 ug via INTRAVENOUS
  Filled 2024-09-07: qty 1
  Filled 2024-09-07 (×2): qty 2
  Filled 2024-09-07: qty 1
  Filled 2024-09-07 (×3): qty 2
  Filled 2024-09-07 (×2): qty 1

## 2024-09-07 NOTE — Consult Note (Signed)
 WOC Nurse Consult Note: Reason for Consult: LLE bullae, weeping, wounds  Wound type: large scattered Bullae to LLE, intact bullae filled with serous fluid, several opened bullae reveal full  and partial thickness   wounds  Pressure Injury POA: NA Measurement: see nursing flow sheets- multiple scattered wounds Wound bed: intact serous filled bullae and opened bullae revealing red/pink, moist tissue Drainage (amount, consistency, odor) serosanguinous Periwound: ecchymosis and dark purple/blue discoloration to LE suggestive of decreased/compromised perfusion Dressing procedure/placement/frequency:  Cleanse with NS, par dry.  Cover intact and open Bullae with Xeroform, top with abd pad and wrap with Kerlix from ankle to mid-thigh.  Avoid placing tape directly on skin (tape over dressing only). Off load pressure to bilateral heels in Prevalon Boots (lawson # [phone])  WOC team will not follow patient at this time, please re consult if new needs arise.    Thank you,  Doyal Polite, RN, MSN, [facility] WOC Team [phone] (Available Mon-Fri 0700-1500)

## 2024-09-07 NOTE — Plan of Care (Signed)
  Problem: Clinical Measurements: Goal: Ability to maintain clinical measurements within normal limits will improve Outcome: Progressing Goal: Diagnostic test results will improve Outcome: Progressing Goal: Respiratory complications will improve Outcome: Progressing   Problem: Nutrition: Goal: Adequate nutrition will be maintained Outcome: Progressing

## 2024-09-07 NOTE — Progress Notes (Signed)
 [place] [facility] ROUNDING NOTE   Subjective: No significant events.  Pressor requirement stable to improving, vent settings stable.  Tolerating CRRT.  Acidosis, coagulopathy improving, INR downtrending. No evidence of GI bleeding thus far.    Objective: Vital signs in last 24 hours: Temp:  [93.4 F (34.1 C)-98.6 F (37 C)] 98.6 F (37 C) (09/19 1000) Pulse Rate:  [49-73] 62 (09/19 1000) Resp:  [15-25] 24 (09/19 1000) SpO2:  [80 %-100 %] 100 % (09/19 1000) Arterial Line BP: (61-128)/(23-65) 93/44 (09/19 1000) FiO2 (%):  [40 %] 40 % (09/19 0827) Weight:  [68.8 kg] 68.8 kg (09/19 0500) Last BM Date : 09/07/24 General: Critically ill-appearing Caucasian female, intubated, sedated Lungs: Coarse breath sounds bilaterally Heart:  RRR, no m/r/g Abdomen:  Soft, NT, ND, +BS Ext: Anasarca, extensive bruising, left lower extremity wrapped in bandage, not removed    Intake/Output from previous day: 09/18 0701 - 09/19 0700 In: 3011.5 [I.V.:1353.4; Blood:370; NG/GT:463; IV Piggyback:825.1] Out: 6786 [Urine:31; Emesis/NG output:700] Intake/Output this shift: Total I/O In: 362.7 [I.V.:185.2; NG/GT:42; IV Piggyback:135.5] Out: 1157 [Emesis/NG output:450]   Lab Results: Recent Labs    09/05/24 0320 09/06/24 0445 09/06/24 1034 09/07/24 0500  WBC 12.0* 17.9*  --  20.4*  HGB 8.0*  8.5* 7.6* 8.2* 7.2*  7.5*  PLT 44* 49*  --  52*  MCV 104.0* 102.8*  --  106.2*   BMET Recent Labs    09/06/24 0445 09/06/24 1034 09/06/24 1649 09/07/24 0500  NA 130*  131* 133* 133* 135  135  K 3.7  3.7 3.7 3.8 3.9  3.9  CL 88*  89*  --  94* 99  CO2 24  22  --  20* 20*  GLUCOSE 112*  113*  --  97 85  BUN 19  19  --  16 14  CREATININE 2.01*  2.20*  --  1.54* 1.51*  CALCIUM  7.6*  7.6*  --  8.1* 8.3*   LFT Recent Labs    09/04/24 1328 09/05/24 0320 09/06/24 0445 09/06/24 1649 09/07/24 0500  PROT 5.4* 5.6* 5.5*  --   --   ALBUMIN  3.1* 3.5 3.6  3.6 4.3 4.6  AST 102*  100* 82*  --   --   ALT 39 38 38  --   --   ALKPHOS 34* 30* 36*  --   --   BILITOT 8.5* 9.8* 11.9*  --   --    PT/INR Recent Labs    09/06/24 0911 09/07/24 0500  INR 3.2* 3.2*      Imaging/Other results: IR Paracentesis Result Date: 09/06/2024 INDICATION: 63 year old female with history of cirrhosis, new abdominal distention with sepsis. Diagnostic and therapeutic paracentesis to rule out spontaneous bacterial peritonitis. EXAM: ULTRASOUND GUIDED DIAGNOSTIC AND THERAPEUTIC PARACENTESIS MEDICATIONS: 4 mL 1% lidocaine  COMPLICATIONS: None immediate. PROCEDURE: Informed written consent was obtained from the patient after a discussion of the risks, benefits and alternatives to treatment. A timeout was performed prior to the initiation of the procedure. Initial ultrasound scanning demonstrates a small amount of ascites within the right lower abdominal quadrant. The right lower abdomen was prepped and draped in the usual sterile fashion. 1% lidocaine  was used for local anesthesia. Following this, a 19 gauge, 7-cm, Cook centesis catheter was introduced. An ultrasound image was saved for documentation purposes. The paracentesis was performed. The catheter was removed and a dressing was applied. The patient tolerated the procedure well without immediate post procedural complication. FINDINGS: A total of approximately 1.3 liters of light  amber/orange fluid was removed. Samples were sent to the laboratory as requested by the clinical team. IMPRESSION: Successful ultrasound-guided paracentesis yielding 1.3 liters of peritoneal fluid. Performed by: Kacie Matthews PA-C Electronically Signed   By: Cordella Banner   On: 09/06/2024 15:15   DG CHEST PORT 1 VIEW Result Date: 09/06/2024 CLINICAL DATA:  Endotracheally intubated, ARDS EXAM: PORTABLE CHEST 1 VIEW COMPARISON:  09/05/2024 FINDINGS: Endotracheal tube is 5 cm above the carina. NG tube is in the stomach. Right dialysis catheter is unchanged. Heart and  mediastinal contours are within normal limits. Airspace disease improved since prior study. Probable small layering effusions. IMPRESSION: Diffuse bilateral airspace disease improving since prior study. Small layering effusions. Electronically Signed   By: Franky Crease M.D.   On: 09/06/2024 13:41   DG CHEST PORT 1 VIEW Result Date: 09/05/2024 CLINICAL DATA:  [id number] Encounter for central line placement [id number] EXAM: PORTABLE CHEST - 1 VIEW COMPARISON:  09/05/2024 7:28 a.m. FINDINGS: Similarly positioned endotracheal tube terminating in the mid trachea. Right IJ approach central venous catheter has been placed in the interim, terminating in the lower SVC. Overlying defibrillator pads again noted. Esophagogastric tube courses below the diaphragm with the distal tip not included in the field of view. Unchanged diffuse hazy airspace disease throughout both lungs. Likely layering bilateral pleural effusions are also present. Mild cardiomegaly. No pneumothorax. IMPRESSION: Interval placement of a right IJ approach central venous catheter, which terminates in the lower SVC. No pneumothorax. Otherwise, little significant interval change to the lungs. Electronically Signed   By: Rogelia Myers M.D.   On: 09/05/2024 16:32      Assessment and Plan:   63 year old female with decompensated alcohol-induced cirrhosis admitted with severe sepsis and progressive multi-organ failure with PEA arrest on Sept 16.     Septic shock with DIC - Still requiring three pressors currently, but seems to be improving - On zosyn  3.375 g q8, vancomycin  d/c'd - Lactate slowly clearing - GNRs in blood making cellulitis less likely cause of infection.  SBP less likely based on ascitic fluid analysis (although results may be influenced by prior antibiotics); no ID on GNR's yet - Management per PCCM - Stress dose hydrocortisone  100 mg IV q8   Decompensated cirrhosis - Severe liver dysfunction with underlying portal hypertension  impairing her ability to fight infection and contributing to multiorgan failure, encephalopathy, coagulopathy and lactic acidosis - Patient at very risk for GI bleeding given cirrhosis and DIC with severe coagulopathy and thrombocytopenia, but no evidence of bleeding yet - Continue PPI BID, octreotide  discontinued - No role for prednisolone  for alcoholic hepatitis; patient had already been treated and had high Lille score prior to admission, prompting cessation of prednisolone  -Coagulopathy improved, INR 3.2 from 5.63 days ago   Acute renal failure, secondary to septic shock; 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  Please contact GI if evidence of GI bleeding   Glendia FORBES Holt, MD  09/07/2024, 12:29 PM [facility]

## 2024-09-07 NOTE — Progress Notes (Signed)
 Communicated with Dr. Federico (referring provider for transplant evaluation at [facility]). Let her know we have not been able to reach patient to schedule evaluation. Dr. Federico relayed patient is currently in the hospital.   Let Dr. Federico know that if she/her GI team/primary team feels that patient gets to point where she should be considered for transfer to [facility] for inpatient transplant evaluation pending her clinical status, then they should call through transfer center to discuss with on call hepatologist.   IZETTA ARLENA CORE, MD IZETTA CORE, MD Assistant Professor, Department of Gastroenterology, Hepatology, and Transplant Hepatology

## 2024-09-07 NOTE — Progress Notes (Addendum)
 [facility] Progress Note  Name: Rachel Lucas MRN: [id number] DOB: 1961/11/02  Chief Complaint:  Leg swelling and chills  Subjective:  Seen and examined on CRRT.  Procedure supervised.  She had 4.6 liters UF over 9/18 with CRRT.  She has been on levo at 7 mcg/min, epi at 2 mcg/min, and vaso at 0.04 units/min.  Team noted that she had been making 15 ml/hr of urine and then this reduced with the increased UF with CRRT.  IR performed bedside paracentesis on 9/18 with 1.3 liters   Review of systems:  Unable to obtain 2/2 intubated and sedated   ----------------------- Background on consult:  Rachel Lucas is a 63 y.o. female with a history of hypothyroidism and recent diagnosis of alcoholic cirrhosis who presented to the hospital with chills and leg swelling.  She also left lower extremity erythema.  She was diagnosed with cellulitis.  She was also found to have otitis media with moderate effusion.  Unfortunately her course was complicated by a PEA arrest on 9/16.  She did achieve ROSC - per charting had 6-7 minutes of CPR.  She is currently intubated and on norepinephrine  9 mcg/min, epinephrine  at 6 mcg/min, and vasopressin  at 0.04 units/min.  Course has also been complicated by ARDS, DIC as well as AKI.  Nephrology is consulted for assistance with management of AKI.  Pulmonary critical care is concerned that she is nearing the need for CRRT.  She had 50 mL UOP over 9/16 charted.  She has been on a bicarb gtt at 125 ml/hr.  Note that IR was consulted for a possible paracentesis - they did not feel she was stable for same per their note.  She has been on vanc and zosyn  and pharmacy is consulted for dosing of the same.  Team has a right femoral central line and art line.  They had attempted a left internal jugular central line earlier and this was not able to be placed.    I spoke with her husband at bedside and later her husband, her sister, brother-in-law, and niece in the  family room in the ICU.  We discussed the risks/benefits/indications for CRRT.  At this time, they would like to talk as a family to decide how to proceed.    Intake/Output Summary (Last 24 hours) at 09/07/2024 0657 Last data filed at 09/07/2024 0600 Gross per 24 hour  Intake 3026.23 ml  Output 6711 ml  Net -3684.77 ml    Vitals:  Vitals:   09/07/24 0515 09/07/24 0530 09/07/24 0545 09/07/24 0600  BP:      Pulse: 66 66 67 67  Resp: (!) 24 (!) 24 (!) 24 (!) 24  Temp: 97.9 F (36.6 C) 97.9 F (36.6 C) 98.1 F (36.7 C) 98.2 F (36.8 C)  TempSrc:      SpO2: 100% 100% 100% 100%  Weight:      Height:         Physical Exam:   General adult female critically ill and intubated HEENT normocephalic blood noted in and around her mouth; sclera are icteric Neck supple trachea midline Lungs coarse mechanical breath sounds; FIO2 40 and PEEP 8 Heart S1S2 no rub Abdomen softer and less distended; unable to assess tenderness 2/2 sedation Extremities 2-3+ edema bilateral lower extremities; softer; left lower leg wrapped with gauze.  Leg with erythema and mottling.  Stigmata of chronic liver disease Neuro continuous sedation running GU foley catheter in place   Medications reviewed   Labs:  Latest Ref Rng & Units 09/07/2024    5:00 AM 09/06/2024    4:49 PM 09/06/2024   10:34 AM  BMP  Glucose 70 - 99 mg/dL 85  97    BUN 6 - 20 mg/dL 14  16    Creatinine 9.55 - 1.00 mg/dL 8.48  8.45    Sodium 864 - 145 mmol/L 135 - 145 mmol/L 135    135  133  133   Potassium 3.5 - 5.1 mmol/L 3.5 - 5.1 mmol/L 3.9    3.9  3.8  3.7   Chloride 98 - 111 mmol/L 99  94    CO2 22 - 32 mmol/L 20  20    Calcium  8.9 - 10.3 mg/dL 8.3  8.1       Assessment/Plan:    # AKI  - Secondary to ischemic ATN from her arrest.  Given that she is post-arrest and anuric and in shock, this is unfortunately expected to worsen.  Baseline Cr less than 1.  Renal US  with bilateral increased echogenicity.  Initiated on CRRT  on 9/17 after nontunneled catheter with critical care for acidosis and shock with worsening AKI and volume status  ------------- - Continue CRRT.  Increase dialysate to 1.5 liters/hr    - 4K fluids  - Decrease UF goal slightly to net negative 50 mL/hr as tolerated   # Septic shock  - Pressors per primary team  - antibiotics per primary team and pharmacy is consulted for dosing    # PEA arrest  - supportive post-arrest care    # Hyponatremia  - Improved with CRRT.  - We started with a lower rate of dialysate currently and have increased same    # ARDS  - Vent per critical care  - Optimize volume status with CRRT    # Metabolic acidosis - She is on CRRT - s/p bicarb gtt - now off  - Treat shock and underlying etiology as above   # Cirrhosis - decompensated  - she is on albumin  and levophed   - IR performed bedside paracentesis on 9/18 with 1.3 liters     # Hyperphosphatemia  - improved with CRRT   - follow BID labs to monitor for need to begin repleting phos   # Macrocytic anemia  - Concern for DIC - PRBC's per primary team  - With past heavy alcohol use there is concern for nutrient deficiency, as well  - markedly elevated ferritin on check in August and iron sats not calculated.  Folate low at 3.2.  she is on folic acid  and thiamine  as well.  B12 ok    # Thrombocytopenia  - note that she has had oozing from some IV sites per nursing - treating DIC per critical care team    Disposition - continue ICU monitoring    Katheryn JAYSON Saba, MD 09/07/2024 7:28 AM   Called her husband and updated him this morning.  All questions answered.  Katheryn JAYSON Saba, MD 7:28 AM 09/07/2024

## 2024-09-07 NOTE — Progress Notes (Signed)
 [facility] [place] PROGRESS NOTE  ASSESSMENT / PLAN: Rachel Lucas is a 63 y.o. female with left cellulitis; hepatorenal syndrome; shock. Brisk doppler flow in L DP / PT today. Low concern for vascular compromise. Please call for questions.  SUBJECTIVE: Intubated / sedated. No changes per bedside nurse.   OBJECTIVE: BP 93/63 (BP Location: Left Arm)   Pulse 62   Temp 98.6 F (37 C)   Resp (!) 24   Ht 5' 3 (1.6 m)   Wt 68.8 kg   SpO2 100%   BMI 26.87 kg/m   Intake/Output Summary (Last 24 hours) at 09/07/2024 1048 Last data filed at 09/07/2024 1000 Gross per 24 hour  Intake 2954.05 ml  Output 6529 ml  Net -3574.95 ml    No distress Regular rate and rhythm Unlabored breathing Brisk doppler flow in left foot Ecchymosis and bullae unchanged in LLE     Latest Ref Rng & Units 09/07/2024    5:00 AM 09/06/2024   10:34 AM 09/06/2024    4:45 AM  CBC  WBC 4.0 - 10.5 K/uL 20.4   17.9   Hemoglobin 12.0 - 15.0 g/dL 87.9 - 84.9 g/dL 7.2    7.5  8.2  7.6   Hematocrit 36.0 - 46.0 % 36.0 - 46.0 % 20.7    22.0  24.0  21.8   Platelets 150 - 400 K/uL 52   49         Latest Ref Rng & Units 09/07/2024    5:00 AM 09/06/2024    4:49 PM 09/06/2024   10:34 AM  CMP  Glucose 70 - 99 mg/dL 85  97    BUN 6 - 20 mg/dL 14  16    Creatinine 9.55 - 1.00 mg/dL 8.48  8.45    Sodium 864 - 145 mmol/L 135 - 145 mmol/L 135    135  133  133   Potassium 3.5 - 5.1 mmol/L 3.5 - 5.1 mmol/L 3.9    3.9  3.8  3.7   Chloride 98 - 111 mmol/L 99  94    CO2 22 - 32 mmol/L 20  20    Calcium  8.9 - 10.3 mg/dL 8.3  8.1      Estimated Creatinine Clearance: 36.9 mL/min (A) (by C-G formula based on SCr of 1.51 mg/dL (H)).  Debby SAILOR. Magda, MD [facility] Phone Number: [phone] 09/07/2024 10:48 AM

## 2024-09-07 NOTE — Progress Notes (Signed)
 Nutrition Brief Note  Patient discussed in rounds and with RN.  Trickle feeds started via OGT yesterday.  RN concerned with tolerance to tube feeding as blood sugars declined over night following initiation of feeds. OGT placed back to suction and put out this morning. Yesterday pt with of yellow transparent fluid.   Patient has had other prior episodes of hypoglycemia. Suspect this is more related to cirrhosis than intolerance to tube feeding as pt is now having multiple bowel movements (on lactulose  TID). Unfortunately abdominal distension is a poor indicator to measure intolerance given ascites (pt now s/p paracentesis on 9/18 yield 1.3L).  Updated abdominal xray ordered to ensure no presence of ileus.  Discussed with MD in rounds.  Plan for post pyloric Cortrak placement today, resume trickle feeds and monitor tolerance.   Tolerating CRRT.  Remains on 3 pressors though dose requirements are coming down.   Nutrition Interventions: Resume Vital 1.5 at 59ml/hr once post pyloric cortrak placed and confirmed by xray.   Once tolerance established and able to advance, recommend: Advancing Vital 1.5 by 10ml q12h to goal rate of 43ml/hr ( per day) 60ml ProSource TF20 BID Provides 1600 kcal, 105g protein, 733ml free water  daily   Monitor magnesium , potassium, and phosphorus daily for at least 3 days, MD to replete as needed, as pt is at risk for refeeding syndrome given inadequate oral intake >7 days. Continue thiamine  as ordered   Allie Guy Seese, RDN, LDN Clinical Nutrition See AMi[facility] for contact information.

## 2024-09-07 NOTE — Procedures (Addendum)
 Cortrak  Person Inserting Tube:  Merilee, Geffrey Michaelsen L, RD Tube Type:  Cortrak - 55 inches Tube Size:  10 Tube Location:  Right nare Secured by: Bridle Initial Placement:  Postpyloric Technique Used to Measure Tube Placement:  Marking at nare/corner of mouth Cortrak Secured At:  110 cm Initial Placement Verification:  Xray  Cortrak Tube Team Note:  Consult received to place a Cortrak feeding tube. Pt with an INR above threshold for placement, unit RD discussed proceeding placement with MD resident and CCM MD, ok with proceeding with placement.   X-ray is required. RN may begin using tube once placement is confirmed.   If the tube becomes dislodged please keep the tube and contact the Cortrak team at [email or web address] for replacement.  If after hours and replacement cannot be delayed, place a NG tube and confirm placement with an abdominal x-ray.    Nestora Merilee RD, LDN Clinical Dietitian

## 2024-09-07 NOTE — Consult Note (Signed)
 [facility]    Date of Admission:  09/03/2024     Total days of antibiotics 5               Reason for Consult: Bacteremia Referring Provider: Dr. Geronimo  Primary Care Provider: Auston Opal, DO   ASSESSMENT:  [name] is a 63 y/o female with end stage liver disease presenting with acute bilateral leg swelling and left leg rash requiring intubation and course complicated by PEA arrest with ROSC and concern for cellulitis/sepsis. Now on vasopressors and CRRT. Blood culture growing gram negative organism with identification pending. Peritoneal fluid with no evidence of bacterial peritonitis. Appears source may be left leg versus GI. Rash on left leg appears like a vasculitis as opposed to cellulitis. Will check blood cultures for any organisms although has been on broad spectrum coverage. Recommend removing lines as able. Will check inflammatory markers. Hepatitis panel negative for acute infection. Continue to monitor cultures for organism identification. Continue piperacillin -tazobactam and no indication for vancomycin  at this point. Remaining medical and supportive care per PCCM.   PLAN:  Continue current dose of piperacillin -tazobactam.  Check blood cultures.  Monitor cultures for organisms and narrow antibiotics as appropriate.  Check inflammatory markers.  Standard/universal precautions. Remaining medial and supportive care per PCCM.    Principal Problem:   Septic shock (HCC) Active Problems:   Hepatorenal syndrome (HCC)   AKI (acute kidney injury) (HCC)    sodium chloride    Intravenous Once   sodium chloride    Intravenous Once   Chlorhexidine  Gluconate Cloth  6 each Topical Daily   docusate  100 mg Per Tube BID   fentaNYL  (SUBLIMAZE ) injection  25-50 mcg Intravenous Once   folic acid   1 mg Per Tube Daily   hydrocortisone  sod succinate (SOLU-CORTEF ) inj  100 mg Intravenous Q8H   lactulose   30 g Per Tube TID   levothyroxine   88  mcg Per Tube QAC breakfast   ofloxacin   5 drop Left EAR Daily   mouth rinse  15 mL Mouth Rinse Q2H   pantoprazole  (PROTONIX ) IV  40 mg Intravenous Q12H   polyethylene glycol  17 g Per Tube Daily   rifaximin   550 mg Per Tube BID   sodium chloride  flush  10-40 mL Intracatheter Q12H   thiamine   100 mg Per Tube Daily     HPI: Rachel Lucas is a 63 y.o. female with previous medical history of end-stage liver disease and hypothyroidism presenting to the hospital with bilateral leg swelling and pain in the left foot x 3 days.  Rachel Lucas presented to the ED with bilateral lower extremity edema and recent diagnosis of stage liver disease.  Initially afebrile with no leukocytosis and white blood cell count of 5800.  Chest x-ray with trace pleural effusion and patchy airspace opacities in both lungs possibly representing atelectasis or developing bronchopneumonia.  Renal ultrasound with bilateral increased renal echogenicity and partially visualized cirrhotic liver with moderate ascites.  CT temporal bones with opacification of the left middle ear and abnormal soft tissue attenuation surrounding left ossicles with concern for otitis media.  CT imaging of left tibia/fibula with no acute fracture or erosive changes and considerable subcutaneous edema without definitive abscess.  Started on broad-spectrum coverage with linezolid  and piperacillin -tazobactam.  Began having worsening delirium and experienced PEA arrest for 6 minutes.  On 9/17 transition from linezolid  to vancomycin  and started on CRRT.  Mildly elevated temperature on 09/05/2024 with 99.3 F.  Has had increasing leukocytosis over the past 3 days with new white blood cell count of 20,400.  Currently on day 5 of antimicrobial therapy with piperacillin -tazobactam and rifaximin .  Peritoneal fluid with total nucleated cell count of 62 and neutrophil fluid 58%.  Blood cultures on 09/03/2024 growing gram-negative rods with no organisms detected on the  [facility] ID.  Those cultures have been reintubated for better growth.  Peritoneal culture with no organisms on Gram stain and no growth less than 24 hours.  20 pathogen respiratory virus panel negative. Has had continually elevated lactic acidosis. Anemic with hemoglobin 7.5 and MCV 106 with suspected folate deficiency. Platelet count down to 52. HIV and Hepatitis Panel negative. Currently intubated with arterial line, central line and HD catheter.    Review of Systems: Review of Systems  Unable to perform ROS: Intubated     Past Medical History:  Diagnosis Date   Hypothyroidism    Thyroid  disease    Von Willebrand disease (HCC)     Social History   Tobacco Use   Smoking status: Former    Current packs/day: 0.00    Average packs/day: 0.2 packs/day for 10.0 years (2.0 ttl pk-yrs)    Types: Cigarettes    Start date: 12/21/1979    Quit date: 12/20/1989    Years since quitting: 34.7   Tobacco comments:    Pt also notes passive exposure- father smoked in house growing up.   Vaping Use   Vaping status: Never Used  Substance Use Topics   Alcohol use: Not Currently   Drug use: Never    Family History  Problem Relation Age of Onset   Asthma Mother    Eczema Mother    Heart Problems Father     Allergies  Allergen Reactions   Levofloxacin Other (See Comments)   Sulfonamide Derivatives Palpitations    OBJECTIVE: Blood pressure 93/63, pulse 60, temperature 97.7 F (36.5 C), resp. rate (!) 24, height 5' 3 (1.6 m), weight 68.8 kg, SpO2 94%.  Physical Exam Constitutional:      General: She is not in acute distress.    Appearance: She is well-developed. She is ill-appearing.     Interventions: She is sedated, intubated and restrained.  Cardiovascular:     Rate and Rhythm: Normal rate and regular rhythm.     Heart sounds: Murmur heard.  Pulmonary:     Effort: Pulmonary effort is normal. She is intubated.     Breath sounds: Normal breath sounds.  Skin:    General: Skin is dry.      Comments: Left leg with vasculitis appearing rash on foot. Left foot warm compared to right.   Neurological:     Mental Status: She is alert.     Lab Results Lab Results  Component Value Date   WBC 20.4 (H) 09/07/2024   HGB 7.2 (L) 09/07/2024   HGB 7.5 (L) 09/07/2024   HCT 20.7 (L) 09/07/2024   HCT 22.0 (L) 09/07/2024   MCV 106.2 (H) 09/07/2024   PLT 52 (L) 09/07/2024    Lab Results  Component Value Date   CREATININE 1.51 (H) 09/07/2024   BUN 14 09/07/2024   NA 135 09/07/2024   NA 135 09/07/2024   K 3.9 09/07/2024   K 3.9 09/07/2024   CL 99 09/07/2024   CO2 20 (L) 09/07/2024    Lab Results  Component Value Date   ALT 38 09/06/2024   AST 82 (H) 09/06/2024   ALKPHOS 36 (L) 09/06/2024   BILITOT  11.9 (H) 09/06/2024     Microbiology: Recent Results (from the past 240 hours)  MRSA Next Gen by PCR, Nasal     Status: None   Collection Time: 09/03/24  4:45 PM   Specimen: Nasal Mucosa; Nasal Swab  Result Value Ref Range Status   MRSA by PCR Next Gen NOT DETECTED NOT DETECTED Final    Comment: (NOTE) The GeneXpert MRSA Assay (FDA approved for NASAL specimens only), is one component of a comprehensive MRSA colonization surveillance program. It is not intended to diagnose MRSA infection nor to guide or monitor treatment for MRSA infections. Test performance is not FDA approved in patients less than 68 years old. Performed at [facility], [address], [place], [place] [address]   Blood culture (routine x 2)     Status: None (Preliminary result)   Collection Time: 09/03/24  9:30 PM   Specimen: BLOOD  Result Value Ref Range Status   Specimen Description BLOOD SITE NOT SPECIFIED  Final   Special Requests   Final    BOTTLES DRAWN AEROBIC AND ANAEROBIC Blood Culture results may not be optimal due to an inadequate volume of blood received in culture bottles   Culture  Setup Time   Final    GRAM NEGATIVE RODS ANAEROBIC BOTTLE ONLY CRITICAL VALUE NOTED.  VALUE IS  CONSISTENT WITH PREVIOUSLY REPORTED AND CALLED VALUE.    Culture   Final    GRAM NEGATIVE RODS CULTURE REINCUBATED FOR BETTER GROWTH Performed at [facility], [address], [place], [place] [address]    Report Status PENDING  Incomplete  Blood culture (routine x 2)     Status: None (Preliminary result)   Collection Time: 09/03/24  9:30 PM   Specimen: BLOOD  Result Value Ref Range Status   Specimen Description BLOOD SITE NOT SPECIFIED  Final   Special Requests   Final    BOTTLES DRAWN AEROBIC AND ANAEROBIC Blood Culture results may not be optimal due to an inadequate volume of blood received in culture bottles   Culture  Setup Time   Final    GRAM NEGATIVE RODS ANAEROBIC BOTTLE ONLY CRITICAL RESULT CALLED TO, READ BACK BY AND VERIFIED WITH: PHARMD BLAKE WANNARAT ON 09/05/24 @ 1525 BY DRT    Culture   Final    GRAM NEGATIVE RODS CULTURE REINCUBATED FOR BETTER GROWTH Performed at [facility], [address] [place], [place] [address]    Report Status PENDING  Incomplete  Blood Culture ID Panel (Reflexed)     Status: None   Collection Time: 09/03/24  9:30 PM  Result Value Ref Range Status   Enterococcus faecalis NOT DETECTED NOT DETECTED Final   Enterococcus Faecium NOT DETECTED NOT DETECTED Final   Listeria monocytogenes NOT DETECTED NOT DETECTED Final   Staphylococcus species NOT DETECTED NOT DETECTED Final   Staphylococcus aureus (BCID) NOT DETECTED NOT DETECTED Final   Staphylococcus epidermidis NOT DETECTED NOT DETECTED Final   Staphylococcus lugdunensis NOT DETECTED NOT DETECTED Final   Streptococcus species NOT DETECTED NOT DETECTED Final   Streptococcus agalactiae NOT DETECTED NOT DETECTED Final   Streptococcus pneumoniae NOT DETECTED NOT DETECTED Final   Streptococcus pyogenes NOT DETECTED NOT DETECTED Final   A.calcoaceticus-baumannii NOT DETECTED NOT DETECTED Final   Bacteroides fragilis NOT DETECTED NOT DETECTED Final   Enterobacterales NOT DETECTED  NOT DETECTED Final   Enterobacter cloacae complex NOT DETECTED NOT DETECTED Final   Escherichia coli NOT DETECTED NOT DETECTED Final  Klebsiella aerogenes NOT DETECTED NOT DETECTED Final   Klebsiella oxytoca NOT DETECTED NOT DETECTED Final   Klebsiella pneumoniae NOT DETECTED NOT DETECTED Final   Proteus species NOT DETECTED NOT DETECTED Final   Salmonella species NOT DETECTED NOT DETECTED Final   Serratia marcescens NOT DETECTED NOT DETECTED Final   Haemophilus influenzae NOT DETECTED NOT DETECTED Final   Neisseria meningitidis NOT DETECTED NOT DETECTED Final   Pseudomonas aeruginosa NOT DETECTED NOT DETECTED Final   Stenotrophomonas maltophilia NOT DETECTED NOT DETECTED Final   Candida albicans NOT DETECTED NOT DETECTED Final   Candida auris NOT DETECTED NOT DETECTED Final   Candida glabrata NOT DETECTED NOT DETECTED Final   Candida krusei NOT DETECTED NOT DETECTED Final   Candida parapsilosis NOT DETECTED NOT DETECTED Final   Candida tropicalis NOT DETECTED NOT DETECTED Final   Cryptococcus neoformans/gattii NOT DETECTED NOT DETECTED Final    Comment: Performed at [facility], [address] [place], [place] [address]  Respiratory (~20 pathogens) panel by PCR     Status: None   Collection Time: 09/03/24  9:40 PM   Specimen: Line, Central; Respiratory  Result Value Ref Range Status   Adenovirus NOT DETECTED NOT DETECTED Final   Coronavirus 229E NOT DETECTED NOT DETECTED Final    Comment: (NOTE) The Coronavirus on the Respiratory Panel, DOES NOT test for the novel  Coronavirus (2019 nCoV)    Coronavirus HKU1 NOT DETECTED NOT DETECTED Final   Coronavirus NL63 NOT DETECTED NOT DETECTED Final   Coronavirus OC43 NOT DETECTED NOT DETECTED Final   Metapneumovirus NOT DETECTED NOT DETECTED Final   Rhinovirus / Enterovirus NOT DETECTED NOT DETECTED Final   Influenza A NOT DETECTED NOT DETECTED Final   Influenza B NOT DETECTED NOT DETECTED Final   Parainfluenza Virus 1 NOT  DETECTED NOT DETECTED Final   Parainfluenza Virus 2 NOT DETECTED NOT DETECTED Final   Parainfluenza Virus 3 NOT DETECTED NOT DETECTED Final   Parainfluenza Virus 4 NOT DETECTED NOT DETECTED Final   Respiratory Syncytial Virus NOT DETECTED NOT DETECTED Final   Bordetella pertussis NOT DETECTED NOT DETECTED Final   Bordetella Parapertussis NOT DETECTED NOT DETECTED Final   Chlamydophila pneumoniae NOT DETECTED NOT DETECTED Final   Mycoplasma pneumoniae NOT DETECTED NOT DETECTED Final    Comment: Performed at [facility], [address], [place], [place] [address]  Resp panel by RT-PCR (RSV, Flu A&B, Covid) Line, Central     Status: None   Collection Time: 09/03/24  9:40 PM   Specimen: Line, Central; Nasal Swab  Result Value Ref Range Status   SARS Coronavirus 2 by RT PCR NEGATIVE NEGATIVE Final   Influenza A by PCR NEGATIVE NEGATIVE Final   Influenza B by PCR NEGATIVE NEGATIVE Final    Comment: (NOTE) The Xpert Xpress SARS-CoV-2/FLU/RSV plus assay is intended as an aid in the diagnosis of influenza from Nasopharyngeal swab specimens and should not be used as a sole basis for treatment. Nasal washings and aspirates are unacceptable for Xpert Xpress SARS-CoV-2/FLU/RSV testing.  Fact Sheet for Patients: BloggerCourse.com  Fact Sheet for Healthcare Providers: SeriousBroker.it  This test is not yet approved or cleared by the [place]  FDA and has been authorized for detection and/or diagnosis of SARS-CoV-2 by FDA under an Emergency Use Authorization (EUA). This EUA will remain in effect (meaning this test can be used) for the duration of the COVID-19 declaration under Section 564(b)(1) of the Act, 21 U.S.C. section 360bbb-3(b)(1), unless the authorization is terminated  or revoked.     Resp Syncytial Virus by PCR NEGATIVE NEGATIVE Final    Comment: (NOTE) Fact Sheet for  Patients: BloggerCourse.com  Fact Sheet for Healthcare Providers: SeriousBroker.it  This test is not yet approved or cleared by the [place]  FDA and has been authorized for detection and/or diagnosis of SARS-CoV-2 by FDA under an Emergency Use Authorization (EUA). This EUA will remain in effect (meaning this test can be used) for the duration of the COVID-19 declaration under Section 564(b)(1) of the Act, 21 U.S.C. section 360bbb-3(b)(1), unless the authorization is terminated or revoked.  Performed at [facility], [address] [place], [place] [address]   Body fluid culture w Gram Stain     Status: None (Preliminary result)   Collection Time: 09/06/24  4:28 PM   Specimen: Peritoneal Washings; Pleural Fluid  Result Value Ref Range Status   Specimen Description PERITONEAL  Final   Special Requests NONE  Final   Gram Stain NO WBC SEEN NO ORGANISMS SEEN   Final   Culture   Final    NO GROWTH < 24 HOURS Performed at [facility], [address] [place], [place] [address]    Report Status PENDING  Incomplete   I have personally spent 30 minutes involved in face-to-face and non-face-to-face activities for this patient on the day of the visit. Professional time spent includes the following activities: preparing to see the patient (review of tests), obtaining and reviewing separately obtained history (admission/discharge record), performing a medically appropriate examination, ordering medications, communicating with other health care professionals, documenting clinical information in the EMR, communicating results and counseling patient and family regarding medication and plan of care, and care coordination.    Greg Jamita Mckelvin, NP [facility]  09/07/2024  3:00 PM

## 2024-09-07 NOTE — Progress Notes (Signed)
 NAME:  Rachel DICOLA, MRN:  [id number], DOB:  Jun 19, 1961, LOS: 4 ADMISSION DATE:  09/03/2024, CONSULTATION DATE:  09/03/2024 REFERRING MD:  EDP, CHIEF COMPLAINT:  Left leg pain   History of Present Illness:  Patient is a 63 yo female with new diagnosis of alcoholic cirrhosis with septic shock thought 2/2 LLE cellulitis, does have persistent otitis media from recent prior admission for Klebsiella pneumonia and bacteremia.   Patient had concern for single episode of melena the day prior to admission, but subsequently had normal stools.  Had no cough, congestion, or hematemesis.  Did endorse worsening LLE swelling and redness with some pain the day of admission.   Recently, patient was admitted for Klebciella bacteremia and pneumonia in setting of accidental ear canal perforation with Q-tip (8/20-8/27).   In the ED, patient was unresponsive to fluids with BP 60/30s and PCCM was consulted for admission.   Pertinent  Medical History  Fam hx of VWD but test 8/25 neg Alcoholic cirrhosis recently diagnosed Sober for 1 month per self and husband   Significant Hospital Events: Including procedures, antibiotic start and stop dates in addition to other pertinent events   9/15 - Admitted, started Zosyn  (9/15-) and linezolid  (9/15-9/17), GI consulted with no concern for GIB, CVC and Art line placed in PM 9/16 - Worsening delirium, PEA arrest for 6 minutes with ROSC, NG tube in place 9/17 - Transitioned from linezolid  to vancomycin  (9/17-).  Nephro consulted and CRRT initiated with 491 mL UF throughout the day and 115 mL UOP, R internal jugular CVC placed 9/18 - Cultures growing GNRs (antibiotics unchanged), developed weeping bullae of LLE, paracentesis per IR with 1.3 L output, CRRT UF goal increased 9/19 - CRRT UF goal decreased to 50, lactate gradually downtrending, CXR improving   Interim History / Subjective:  4.7 L CRRT output and 1.3 L paracentesis output past 24 hours. Patient also with  8 bowel movements but just 31 mL UOP in past 24 hours. This morning, patient is overall unchanged from yesterday, though legs are weeping less as edema decreased.  Patient also noted to have >400 gastric residual after trickle feeds.   Objective    Blood pressure 93/63, pulse 67, temperature 98.2 F (36.8 C), resp. rate (!) 24, height 5' 3 (1.6 m), weight 68.8 kg, SpO2 100%. CVP:  [10 mmHg-38 mmHg] 19 mmHg  Vent Mode: PRVC FiO2 (%):  [40 %] 40 % Set Rate:  [24 bmp] 24 bmp Vt Set:  [420 mL] 420 mL PEEP:  [8 cmH20] 8 cmH20 Plateau Pressure:  [20 cmH20-28 cmH20] 20 cmH20   Intake/Output Summary (Last 24 hours) at 09/07/2024 0715 Last data filed at 09/07/2024 0700 Gross per 24 hour  Intake 3011.49 ml  Output 6786 ml  Net -3774.51 ml   Filed Weights   09/05/24 0500 09/06/24 0500 09/07/24 0500  Weight: 72.4 kg 76.6 kg 68.8 kg   Examination: General: Intubated, sedated, resting in bed in NAD. HEENT: ET, NG, R internal jugular in place. Scleral icterus. PERRLA, though pupils constricted. Cardiovascular: Regular rate and rhythm, occasional irregular beat (PAC on monitor). No murmurs, rubs, or gallops appreciated. 2+ radial pulses. Pulmonary: Coarse breath sounds globally. No wheezes. On ventilator 40% FiO2, no respiratory distress and oxygenating well. Abdominal: No tenderness to deep or light palpation. No rebound or guarding, nondistended. No HSM. Skin: LLE mottling and erythema as below.  Warm and dry. Extremities: 1+ peripheral edema RLE, 2+ LLE, improved from prior day.  Persistent bursting and weeping  bullae of LLE with mottling and extensive erythema.  LLE DP pulses not palpable, PT 1+.  Capillary refill 2-3 seconds. NEURO: RASS -3 to -4. Spontaneously moving R extremities.  Not following instructions, responds to painful stimuli.   Resolved problem list   Assessment and Plan   PULMONARY: Acute lung injury, ARDS, MODS, acute hypoxemic respiratory failure A: Remains intubated  on PRVC since 9/16, developed ARDS 9/17. - 09/07/2024: Repeat chest x-ray this morning with notable improvement in congestion/edema,  remains on 40% FiO2.  ABG WNL. P: - Continue PRVC, continue pulmonary and oral hygiene - Strep pneumo urinary antigen negative, Legionella pending - Stress dose hydrocortisone  100 mg Q8h - RASS goal -2 - Daily AM ABG   NEUROLOGIC: Acute metabolic encephalopathy 2/2 sepsis and cirrhosis A: 6-minute cardiac arrest 9/16.  Postarrest EEG with diffuse encephalopathy.  Will continue to monitor for neurologic recovery after arrest.  Has been sober for 1 month, no concern for alcohol withdrawal. - 09/07/2024: Sedated, encephalopathic.  NSE in process today. P: - Sedation with fentanyl  gtt and Versed  PRN, RASS goal -2 - Consider MRI depending on NSE     VASCULAR: Septic shock with GNR bacteremia A:   Currently in septic shock requiring pressor support. - 09/07/2024: Pressor requirement has again decreased overnight and lactate downtrending. P:  - Titrate norepi to goal MAP >65 - Titrate epi to goal Coox O2>70\ - Continue vasopressin  - Midodrine  10 mg TID     CARDIAC STRUCTURAL: Reduced ejection fraction with severe MR, cardiomyopathy A: Echo 9/16 postarrest showing EF 35-40% with severe MR, moderate to severe TR, and ascites.  Cardiomyopathy in setting of sepsis versus EtOH or other etiology. - 09/07/2024: Troponin downtrending 8166>672 P: - Hold GDMT given shock - Epi for inotropic support as above     CARDIAC ELECTRICAL: Status post PEA arrest 9/16 A: ROSC achieved after 6 minutes.  Suspect septic shock is most likely cause. - 09/07/2024: NSR on monitor. P: - Continuous cardiac monitoring - Follow ionized calcium  Q12h, correct with calcium  gluconate as appropriate     INFECTIOUS: Severe sepsis with septic shock (?LLE cellulitis vs SBP), GNR bacteremia A: Currently with severe sepsis and septic shock, possible 2/2 LLE cellulitis though SBP remains  a strong consideration given GNR bacteremia.  Antibiotics currently covering cellulitis and SBP. - 09/07/2024: Lactate trending down to 6.6, BCID negative and GNR speciation still pending.  Pharmacy reviewed algorithm for negative BCID GNRs and did not recommend any adjustment in antibiotics.  Peritoneal fluid gram stain without organisms, though pretreated. P: - Continue Zosyn  (9/15-) and vancomycin  (9/17-) - Rifaximin  (9/16-) for SBP coverage - Paracentesis per IR - Follow Bcx - Daily CXR - Daily and PM lactate     RENAL: AKI A: Now on CRRT, appreciate assistance from Nephorology. - 09/07/2024: 6.7 L CRRT output past 24 hours, net +11.3 L this admission and improving.  Volume overload likely contributing to extremity edema, but with fluid removal noted improved CXR and decreased edema.  Cr improved to 1.5 but remains nearly anuric with only 31 mL 24 hr UOP. P: - Foley in place - Continue CRRT per Nephrology, who plans to decrease UF goal to negative 50 mL/hr     METABOLIC A: Increased metabolic demand given MODS and septic shock. - 09/06/2024: Currently with high NGT output (700 mL), would like to start NGT feed, reassuringly also having BMs. P: - Trickle feed through NG held due to significant residual (400 mL after ~350 mL tube feed  given ON) - Start D5NS mIVF given dropping CBGs - Consider post-pyloric Cortrak today per RD after abdominal radiography     ELECTROLYTES A: Goal to optimize electrolytes given risk for repeat arrest. - 09/07/2024: Na improving (123>130), K stable on CRRT.  Phosphorus greatly improved to 4.5.  Aim for gradual correction to Na to avoid osmotic demyelination. P: - NPO, oral meds per tube - CRRT as above - Calcium  gluconate x1 now that phosphorus is improved on CRRT - Daily phosphorus and magnesium , CMP     GASTROINTESTINAL: Decompensated alcoholic cirrhosis with ascites, shock liver, history of gastric varices A:   Patient does have decompensated  alcoholic cirrhosis with moderate ascites per US  9/16.  Cirrhosis significantly worsens prognosis for this severe illness, likely slowing clearance of lactate. - 09/07/2024: Less likely SBP given no organisms on paracentesis culture, though was pretreated; will continue SBP coverage.  1.3 L removed via paracentesis yesterday.  Lactate slowly clearing, now in 6es. P:   - Pantoprazole  40 mg BID - Rifaximin  550 mg BID - Thiamine , folate, MVI PO daily - Hold beta blockers given shock - Status post 8 albumin  infusions 25 g Q6h, spacing to Q8h on 9/19 - Lactulose  30 mg TID per tube     HEMATOLOGIC: Chronic macrocytic anemia, DIC A:  Status post 1 unit PRBC 9/16. - 09/07/2024: Hemoglobin slowly downtrending, now 7.2, may need transfusion soon. P: - Continue to trend CBC daily, monitor for signs of bleeding - PRBC for hgb </= 6.9     HEMATOLOGIC - Platelets: Coagulopathy (DIC/sepsis vs decompensated cirrhosis) A: Has underlying hepatic dysfunction given cirrhosis.  Currently with DIC. - 09/07/2024: Platelets stable 49>52 without active bleeding. P: - Consider FFP if platelets <10 or signs of bleeding with coagulopathy - Daily INR     ENDOCRINE: Hypothyroidism A:   Patient with history of hypothyroidism. - 09/07/2024: Recent CBGs have been stable in 70-80 range, significant feed residual noted this AM, D5 started. P: - Continue levothyroxine  88 mcg - Stress dose steroids as above - Started D5NS 20 mL/hr 9/19 - POCT CBGs Q4h     MSK/DERM: Bullous dermatitis of LLE, thought to be 2/2 cellulitis A: LLE with probable cellulitis and worsening bullae likely exacerbated by hydrostatic pressure in setting of volume overload. - 09/07/2024: Some improvement of edema with CRRT.  Vascular Surgery with no concern for LLE ischemia. P: - WOC consult     HEENT: History of L TM perforation with open TM and wick in place A: Consulted Dr. Tobie with [facility] 9/16, who related that he has a no  suspicion for mastoiditis after reviewing CT imaging.  Noted that patient has a packing wick in place and TM is open, so fluid would be expected but does not suspect mastoiditis based on radiography. P: - Switched from Ciprodex  to Ofloxacin  ear drops 9/19 per family request and after clearing with Dr. Tobie - Monitor postauricular area for proptosis   Labs   CBC: Recent Labs  Lab 09/03/24 1320 09/04/24 0415 09/04/24 0523 09/04/24 1130 09/04/24 1328 09/04/24 1355 09/04/24 1952 09/05/24 0320 09/06/24 0445 09/06/24 1034 09/07/24 0500  WBC 3.3* 5.8  --   --  6.4  --   --  12.0* 17.9*  --  20.4*  NEUTROABS 2.2  --   --   --   --   --   --   --   --   --   --   HGB 9.8* 8.3*   < >  --  6.8*   < > 7.5* 8.0*  8.5* 7.6* 8.2* 7.2*  7.5*  HCT 30.7* 24.4*   < >  --  20.5*   < > 22.0* 23.4*  25.0* 21.8* 24.0* 20.7*  22.0*  MCV 120.4* 115.6*  --   --  115.8*  --   --  104.0* 102.8*  --  106.2*  PLT 93* 73*  --  52* 46*  --   --  44* 49*  --  52*   < > = values in this interval not displayed.    Basic Metabolic Panel: Recent Labs  Lab 09/04/24 0415 09/04/24 0523 09/04/24 1328 09/04/24 1355 09/04/24 1836 09/04/24 1952 09/05/24 0320 09/06/24 0445 09/06/24 1034 09/06/24 1649 09/07/24 0500  NA 124*   < > 130*   < > 127*   < > 123*  124* 130*  131* 133* 133* 135  135  K 4.1   < > 4.2   < > 4.2   < > 3.8  3.8 3.7  3.7 3.7 3.8 3.9  3.9  CL 95*  --  91*  --  87*  --  85* 88*  89*  --  94* 99  CO2 16*  --  19*  --  15*  --  19* 24  22  --  20* 20*  GLUCOSE 134*  --  92  --  137*  --  250* 112*  113*  --  97 85  BUN 22*  --  21*  --  23*  --  24* 19  19  --  16 14  CREATININE 2.43*  --  2.60*  --  2.61*  --  2.81* 2.01*  2.20*  --  1.54* 1.51*  CALCIUM  7.3*  --  10.5*  --  7.8*  --  8.0* 7.6*  7.6*  --  8.1* 8.3*  MG 1.3*  --  2.9*  --   --   --  2.6* 2.5*  --   --  2.5*  PHOS 7.4*  --   --   --   --   --  7.4* 4.5  4.5  --  3.7 3.2  3.2   < > = values in this interval  not displayed.   GFR: Estimated Creatinine Clearance: 36.9 mL/min (A) (by C-G formula based on SCr of 1.51 mg/dL (H)). Recent Labs  Lab 09/03/24 2130 09/04/24 0039 09/04/24 1328 09/04/24 1347 09/05/24 0320 09/06/24 0445 09/06/24 1141 09/06/24 1430 09/07/24 0500  PROCALCITON 30.61  --   --   --   --   --   --   --   --   WBC  --    < > 6.4  --  12.0* 17.9*  --   --  20.4*  LATICACIDVEN 6.5*   < >  --    < > 9.0* 7.0* 6.7* 6.8* 6.6*   < > = values in this interval not displayed.    Liver Function Tests: Recent Labs  Lab 09/03/24 1320 09/04/24 0415 09/04/24 1328 09/05/24 0320 09/06/24 0445 09/06/24 1649 09/07/24 0500  AST 178* 126* 102* 100* 82*  --   --   ALT 67* 55* 39 38 38  --   --   ALKPHOS 91 57 34* 30* 36*  --   --   BILITOT 9.8* 10.6* 8.5* 9.8* 11.9*  --   --   PROT 5.4* 6.1* 5.4* 5.6* 5.5*  --   --   ALBUMIN  1.8* 3.0* 3.1*  3.5 3.6  3.6 4.3 4.6   Recent Labs  Lab 09/03/24 1320 09/05/24 1738 09/06/24 0445  LIPASE 37 37 42  AMYLASE  --  41  --    Recent Labs  Lab 09/03/24 1320 09/06/24 0911  AMMONIA 33 45*    ABG    Component Value Date/Time   PHART 7.412 09/07/2024 0500   PCO2ART 36.5 09/07/2024 0500   PO2ART 133 (H) 09/07/2024 0500   HCO3 23.3 09/07/2024 0500   TCO2 24 09/07/2024 0500   ACIDBASEDEF 1.0 09/07/2024 0500   O2SAT 99 09/07/2024 0500     Coagulation Profile: Recent Labs  Lab 09/04/24 1130 09/04/24 1328 09/04/24 1836 09/06/24 0911 09/07/24 0500  INR 4.4* 5.6* 5.4* 3.2* 3.2*    Cardiac Enzymes: Recent Labs  Lab 09/04/24 1836 09/05/24 0320 09/06/24 1141  CKTOTAL 318* 476* 408*    HbA1C: No results found for: HGBA1C  CBG: Recent Labs  Lab 09/06/24 1719 09/06/24 1926 09/06/24 1929 09/06/24 2344 09/07/24 0343  GLUCAP 93 51* 87 76 79    Past Medical History:  She,  has a past medical history of Hypothyroidism, Thyroid  disease, and Von Willebrand disease (HCC).   Surgical History:   Past Surgical  History:  Procedure Laterality Date   CYST REMOVAL NECK  1995   IR PARACENTESIS  09/06/2024     Social History:   reports that she quit smoking about 34 years ago. Her smoking use included cigarettes. She started smoking about 44 years ago. She has a 2 pack-year smoking history. She does not have any smokeless tobacco history on file. She reports that she does not currently use alcohol. She reports that she does not use drugs.   Family History:  Her family history includes Asthma in her mother; Eczema in her mother; Heart Problems in her father.   Allergies Allergies  Allergen Reactions   Levofloxacin Other (See Comments)   Sulfonamide Derivatives Palpitations     Home Medications  Prior to Admission medications   Medication Sig Start Date End Date Taking? Authorizing Provider  folic acid  (FOLVITE ) 1 MG tablet Take 1 tablet (1 mg total) by mouth daily. 08/16/24  Yes Dennise Lavada POUR, MD  furosemide  (LASIX ) 20 MG tablet Take 1 tablet (20 mg total) by mouth daily. 08/30/24  Yes Craig Palma R, PA-C  lactulose , encephalopathy, (ENULOSE ) 10 GM/15ML SOLN Take 15 mLs (10 g total) by mouth 2 (two) times daily. 08/21/24  Yes Federico Rosario BROCKS, MD  levothyroxine  (SYNTHROID ) 88 MCG tablet Take 1 tablet (88 mcg total) by mouth daily before breakfast. 08/15/24  Yes Singh, Prashant K, MD  midodrine  (PROAMATINE ) 5 MG tablet Take 1 tablet (5 mg total) by mouth 2 (two) times daily with a meal. 08/30/24  Yes Craig Palma R, PA-C  propranolol  (INDERAL ) 10 MG tablet Take 1 tablet (10 mg total) by mouth 2 (two) times daily. 08/30/24  Yes Craig Palma R, PA-C  spironolactone  (ALDACTONE ) 50 MG tablet Take 1 tablet (50 mg total) by mouth daily. 08/30/24  Yes Craig Palma R, PA-C  thiamine  (VITAMIN B1) 100 MG tablet Take 1 tablet (100 mg total) by mouth daily. 08/16/24  Yes Singh, Prashant K, MD  potassium chloride  SA (KLOR-CON  M) 20 MEQ tablet Take 1 tablet (20 mEq total) by mouth daily for 2 days. 08/21/24  08/31/24  Federico Rosario BROCKS, MD     Critical care time: 60 minutes     Kristapher [place] Toma, MD PGY-2, ICU resident 09/07/24, 7:15 AM

## 2024-09-07 NOTE — Progress Notes (Addendum)
 ATTESTATION & SIGNATURE   STAFF NOTE: I, Dr [name] Cave have personally reviewed patient's available data, including medical history, events of note, physical examination and test results as part of my evaluation. I have discussed with resident/NP and other care providers such as pharmacist, RN and RRT.  In addition,  I personally evaluated patient and elicited key findings of   S: Date of admit 09/03/2024 with LOS 4 for today 09/07/2024 : Rachel Lucas is  - reamins on vent. She has been negative on CRRT . After this edema improved, cxr clearar - down to 40% fio2.   Afebrile  Tolerated trickle but later high residual Anuric. On CRRT Epi 2, Levophed  4 gtt -> Coox fine No bleeding Plat/INR beetter WUA: still fent gtt. Moves all4s but does not follow commands   Lacate clearning but very slowly - 6 PArcentesis with normal WC -- ongoing scheduld albumin   Childhdood friends _ KAren (From [place]) and Porter ([place]) are at bedside with husband. Darice concerned about vibrio vulnficus . [place] or [place] exposure  O:  Blood pressure 93/63, pulse 62, temperature 98.6 F (37 C), resp. rate (!) 24, height 5' 3 (1.6 m), weight 68.8 kg, SpO2 100%.   Critically ill looking Rachel Lucas blisters in dressing Mottling/edema better   LABS    PULMONARY Recent Labs  Lab 09/04/24 1355 09/04/24 1952 09/05/24 0320 09/06/24 1022 09/06/24 1034 09/06/24 1139 09/07/24 0447 09/07/24 0500 09/07/24 0847  PHART 7.243* 7.251* 7.359  --  7.442  --   --  7.412  --   PCO2ART 44.2 38.0 36.5  --  35.2  --   --  36.5  --   PO2ART 283* 65* 83  --  83  --   --  133*  --   HCO3 19.4* 16.7* 20.9  --  24.1  --   --  23.3  --   TCO2 21* 18* 22  --  25  --   --  24  --   O2SAT 100 89 96   < > 97 70.2 75.9 99 78.7   < > = values in this interval not displayed.    CBC Recent Labs  Lab 09/05/24 0320 09/06/24 0445 09/06/24 1034 09/07/24 0500  HGB 8.0*  8.5* 7.6* 8.2* 7.2*  7.5*   HCT 23.4*  25.0* 21.8* 24.0* 20.7*  22.0*  WBC 12.0* 17.9*  --  20.4*  PLT 44* 49*  --  52*    COAGULATION Recent Labs  Lab 09/04/24 1130 09/04/24 1328 09/04/24 1836 09/06/24 0911 09/07/24 0500  INR 4.4* 5.6* 5.4* 3.2* 3.2*    CARDIAC  No results for input(s): TROPONINI in the last 168 hours. No results for input(s): PROBNP in the last 168 hours.   CHEMISTRY Recent Labs  Lab 09/04/24 0415 09/04/24 0523 09/04/24 1328 09/04/24 1355 09/04/24 1836 09/04/24 1952 09/05/24 0320 09/06/24 0445 09/06/24 1034 09/06/24 1649 09/07/24 0500  NA 124*   < > 130*   < > 127*   < > 123*  124* 130*  131* 133* 133* 135  135  K 4.1   < > 4.2   < > 4.2   < > 3.8  3.8 3.7  3.7 3.7 3.8 3.9  3.9  CL 95*  --  91*  --  87*  --  85* 88*  89*  --  94* 99  CO2 16*  --  19*  --  15*  --  19* 24  22  --  20* 20*  GLUCOSE 134*  --  92  --  137*  --  250* 112*  113*  --  97 85  BUN 22*  --  21*  --  23*  --  24* 19  19  --  16 14  CREATININE 2.43*  --  2.60*  --  2.61*  --  2.81* 2.01*  2.20*  --  1.54* 1.51*  CALCIUM  7.3*  --  10.5*  --  7.8*  --  8.0* 7.6*  7.6*  --  8.1* 8.3*  MG 1.3*  --  2.9*  --   --   --  2.6* 2.5*  --   --  2.5*  PHOS 7.4*  --   --   --   --   --  7.4* 4.5  4.5  --  3.7 3.2  3.2   < > = values in this interval not displayed.   Estimated Creatinine Clearance: 36.9 mL/min (A) (by C-G formula based on SCr of 1.51 mg/dL (H)).   LIVER Recent Labs  Lab 09/03/24 1320 09/03/24 2130 09/04/24 0415 09/04/24 1130 09/04/24 1328 09/04/24 1836 09/05/24 0320 09/06/24 0445 09/06/24 0911 09/06/24 1649 09/07/24 0500  AST 178*  --  126*  --  102*  --  100* 82*  --   --   --   ALT 67*  --  55*  --  39  --  38 38  --   --   --   ALKPHOS 91  --  57  --  34*  --  30* 36*  --   --   --   BILITOT 9.8*  --  10.6*  --  8.5*  --  9.8* 11.9*  --   --   --   PROT 5.4*  --  6.1*  --  5.4*  --  5.6* 5.5*  --   --   --   ALBUMIN  1.8*  --  3.0*  --  3.1*  --  3.5 3.6   3.6  --  4.3 4.6  INR  --    < > 3.0* 4.4* 5.6* 5.4*  --   --  3.2*  --  3.2*   < > = values in this interval not displayed.     INFECTIOUS Recent Labs  Lab 09/03/24 2130 09/04/24 0039 09/06/24 1141 09/06/24 1430 09/07/24 0500  LATICACIDVEN 6.5*   < > 6.7* 6.8* 6.6*  PROCALCITON 30.61  --   --   --   --    < > = values in this interval not displayed.     ENDOCRINE CBG (last 3)  Recent Labs    09/07/24 0811 09/07/24 0858 09/07/24 1133  GLUCAP 60* 153* 106*         IMAGING x24h  - image(s) personally visualized  -   highlighted in bold IR Paracentesis Result Date: 09/06/2024 INDICATION: 63 year old female with history of cirrhosis, new abdominal distention with sepsis. Diagnostic and therapeutic paracentesis to rule out spontaneous bacterial peritonitis. EXAM: ULTRASOUND GUIDED DIAGNOSTIC AND THERAPEUTIC PARACENTESIS MEDICATIONS: 4 mL 1% lidocaine  COMPLICATIONS: None immediate. PROCEDURE: Informed written consent was obtained from the patient after a discussion of the risks, benefits and alternatives to treatment. A timeout was performed prior to the initiation of the procedure. Initial ultrasound scanning demonstrates a small amount of ascites within the right lower abdominal quadrant. The right lower abdomen was prepped and draped in the usual sterile fashion. 1%  lidocaine  was used for local anesthesia. Following this, a 19 gauge, 7-cm, Cook centesis catheter was introduced. An ultrasound image was saved for documentation purposes. The paracentesis was performed. The catheter was removed and a dressing was applied. The patient tolerated the procedure well without immediate post procedural complication. FINDINGS: A total of approximately 1.3 liters of light amber/orange fluid was removed. Samples were sent to the laboratory as requested by the clinical team. IMPRESSION: Successful ultrasound-guided paracentesis yielding 1.3 liters of peritoneal fluid. Performed by: Solmon Ku  PA-C Electronically Signed   By: Cordella Banner   On: 09/06/2024 15:15      A:  Acute resp failure - some bettter   - continue PRVC  Shock better/COox normal   - reduce epi gtt and recheck coox  - Map goal ? > 65  Coagulopathy/DIC  - better  Volume overload  - better with CRRT -> has helped blioster and deme  Renal Faiilure   - now anuric  - pe renal  Svere Sepsis/GNR bactermia  - no evidence SBP. Source  possibly Lucas cellulits/mastoidits  - dc vanc  - cotnnue zosyn   - recheckw with ENT about ear drop  Family updates   - IPAL done   - full code  - pdated today - best friends, Dr Lorn Evangelist over phone (close friend) and husband at bedside  Cirrhiosis   - continue scheduledd albumin  but reduce to q8h  LActic acidosis  - very slow clearance  - track   Anti-infectives (From admission, onward)    Start     Dose/Rate Route Frequency Ordered Stop   09/06/24 1215  vancomycin  (VANCOCIN ) IVPB 1000 mg/200 mL premix        1,000 mg 200 mL/hr over 60 Minutes Intravenous Every 24 hours 09/06/24 1127     09/05/24 1000  rifaximin  (XIFAXAN ) tablet 550 mg        550 mg Per Tube 2 times daily 09/05/24 0813     09/05/24 0900  vancomycin  (VANCOREADY) IVPB 1500 mg/300 mL        1,500 mg 150 mL/hr over 120 Minutes Intravenous  Once 09/05/24 0804 09/05/24 1049   09/05/24 0804  vancomycin  variable dose per unstable renal function (pharmacist dosing)  Status:  Discontinued         Does not apply See admin instructions 09/05/24 0804 09/05/24 1542   09/04/24 1230  rifaximin  (XIFAXAN ) tablet 550 mg  Status:  Discontinued        550 mg Oral 2 times daily 09/04/24 1132 09/05/24 0813   09/04/24 0000  piperacillin -tazobactam (ZOSYN ) IVPB 3.375 g       Placed in Followed by Linked Group   3.375 g 12.5 mL/hr over 240 Minutes Intravenous Every 8 hours 09/03/24 1648     09/03/24 1800  linezolid  (ZYVOX ) IVPB 600 mg  Status:  Discontinued        600 mg 300 mL/hr over 60 Minutes  Intravenous Every 12 hours 09/03/24 1643 09/05/24 0758   09/03/24 1700  piperacillin -tazobactam (ZOSYN ) IVPB 3.375 g       Placed in Followed by Linked Group   3.375 g 100 mL/hr over 30 Minutes Intravenous  Once 09/03/24 1648 09/03/24 2302   09/03/24 1615  cefTRIAXone  (ROCEPHIN ) 2 g in sodium chloride  0.9 % 100 mL IVPB  Status:  Discontinued        2 g 200 mL/hr over 30 Minutes Intravenous  Once 09/03/24 1609 09/03/24 1641        Rest  per NP/medical resident whose note is outlined above and that I agree with  The patient is critically ill with multiple organ systems failure and requires high complexity decision making for assessment and support, frequent evaluation and titration of therapies, application of advanced monitoring technologies and extensive interpretation of multiple databases.   Critical Care Time devoted to patient care services described in this note is  45  Minutes. This time reflects time of care of this signee Dr Dorethia Cave. This critical care time does not reflect procedure time, or teaching time or supervisory time of PA/NP/Med student/Med Resident etc but could involve care discussion time     Dr. Dorethia Cave, M.D., [facility].C.P Pulmonary and Critical Care Medicine Staff Physician [facility] [place] Pager: [phone], If no answer or between  15:00h - 7:00h: call [phone]  09/07/2024 11:34 AM

## 2024-09-07 NOTE — Progress Notes (Addendum)
 NAME:  Rachel Lucas, MRN:  [id number], DOB:  09-Mar-1961, LOS: 4 ADMISSION DATE:  09/03/2024, CONSULTATION DATE:  09/05/2024 REFERRING MD:  EDP, CHIEF COMPLAINT:  Left leg pain   History of Present Illness:  Patient is a 63 yo female with new diagnosis of alcoholic cirrhosis with septic shock thought 2/2 LLE cellulitis, does have persistent otitis media from recent prior admission for Klebsiella pneumonia and bacteremia.    Patient had concern for single episode of melena the day prior to admission, but subsequently had normal stools.  Had no cough, congestion, or hematemesis.  Did endorse worsening LLE swelling and redness with some pain the day of admission.   Recently, patient was admitted for Klebciella bacteremia and pneumonia in setting of accidental ear canal perforation with Q-tip (8/20-8/27).   In the ED, patient was unresponsive to fluids with BP 60/30s and PCCM was consulted for admission.  Patient stated that her baseline SBP is in the 90s.   Pertinent  Medical History  Fam hx of VWD but test 8/25 neg Alcoholic cirrhosis recent new diagnosis Sober for 1 month per self and husband   Significant Hospital Events: Including procedures, antibiotic start and stop dates in addition to other pertinent events   9/15 - Admitted, working dx LE cellulitis and SBP in setting of nex dx of ETOH related cirrhosis.  started Zosyn  (9/15-) and linezolid  (9/15-), GI consulted with no concern for GIB, CVC and Art line placed in PM 9/16 - Worsening delirium, PEA arrest for 6 minutes with ROSC, NG tube in place 9/17 EEG neg for seizure. ECHO: EF 35-40% mod dec'd LVF, pardoxical septal motion, mod to svr MR and TR. IVC w/ < 50% variability. Became hyperglycemic after switch to D10 fluids. Had minimal urine output at 50 mL. Has not been agitated, occasionally spontaneously moves extremities. Nephro consulted. HD cath placed.  CRRT started. IR consulted. Felt not stable for para. Stating:  coagulopathy, shock and hypothermia as factors. IPAL: full code. 9/18 had been off epinephrine  overnight. Back on it in am. Lactic acid 9-->7. Na 123->130, LFTs AST down trending but tbili up, hgb 8->7.6. paracentesis. Neg for SBP.  Vascular consulted. No role for surg. No evidence of limb ischemia  9/19 ID consulted, added inflammatory marker eval raising concern for vasculitis. Pressors down significantly after starting the epinephrine . Cortrak placed. For intolerance of TF, inflammatory eval: sed rate 4, lactate 6.6->4.6. tbili 17 (rising from 11.9). wbc still elevated. Hgb 7.5 to 7. Plts 49->43 9/20 hemodynamics a little better. Still w/ sig WBC ct. Prob steroids but repeating abd US  and CT imaging of head to r/o mastoiditis. While there getting CT head for neuro prognostics    Interim History / Subjective:  Remains on CRRT Remains off fent  Wbc up Still + >9 liters Epi off NE at 5 mcg/min VPA at 0.04 Objective    Blood pressure 93/63, pulse 67, temperature 98.1 F (36.7 C), resp. rate (!) 24, height 5' 3 (1.6 m), weight 68.8 kg, SpO2 100%. CVP:  [9 mmHg-38 mmHg] 13 mmHg  Vent Mode: PRVC FiO2 (%):  [40 %] 40 % Set Rate:  [24 bmp] 24 bmp Vt Set:  [420 mL] 420 mL PEEP:  [8 cmH20] 8 cmH20 Plateau Pressure:  [19 cmH20-21 cmH20] 20 cmH20   Intake/Output Summary (Last 24 hours) at 09/07/2024 1702 Last data filed at 09/07/2024 1600 Gross per 24 hour  Intake 2145.63 ml  Output 4611 ml  Net -2465.37 ml   [employer]  09/05/24 0500 09/06/24 0500 09/07/24 0500  Weight: 72.4 kg 76.6 kg 68.8 kg    Examination: General This is a critically ill 63 year old female patient she remains ventilator and pressor dependent.  She will open her eyes to really any stimulus or gentle movement, there are no obvious focal motor deficits appreciated she remains on CRRT HEENT mild temporal wasting sclera are icteric she has scleral edema pupils are equal reactive scattered areas of ecchymosis over  the head and neck she is orally intubated, has bloody secretions in her oral cavity, IJ dialysis site bloody, no swelling behind the left ear Pulmonary scattered rhonchi, currently still on full ventilator support Plateau pressures at 20 pulse oximetry in the mid 90s  Pcxr w/ support lines and tubes good position. Improved aeration Cardiac regular rate and rhythm without murmur rub or gallop remains on norepinephrine  and vasopressin  Extremities diffuse anasarca, this is slowly improving.  She has multiple areas of ecchymosis scattered all over her body, the left lower extremity wound has several open draining blistered areas There is no real purulence, it is dressed currently and elevated the pulses are palpable Abdomen soft, pitting edema, currently getting postpyloric feeds, liquid stool from a Flexi-Seal GU has a Foley catheter with concentrated urine Neuro off sedation, she does respond to really minimal stimulus not necessarily purposeful, but clearly more than reflexive.  No obvious motor deficits,  Resolved problem list   hyponatremia in setting of cirrhosis and volume overload.   Assessment and Plan   Septic shock w/ lactic acidosis  2/2 GNR bacteremia: working dx cellulitis, further complicated by acute systolic heart failure, and demand ischemia w/ elevated trop I  -not candidate for Left heart cath; not candidate for [facility] given coagulopathy - ? CM from sepsis or etoh?  - lactate slow to clear but suspect her liver fxn playing a role Plan Dec VPA to 0.03 NE for MAP >65 Stress dose steroids Day 6 zosyn , await cultures and further recs per ID Getting abd US  to re-eval GB sludge. Although would expect Alk PO4 to be higher  Still seems to have some pain on the left side of her face, No GDMT given shock state  Will continue albumin  again today to facilitate with third spacing removal on CRRT Getting a unit of blood Previously her CT temporal bone imaging on the 15th did not suggest  osteo, will repeat given tenderness   Acute hypoxic respiratory failure 2/2 Acute lung injury, ARDS,  ? 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 Prob also element of hemolysis on RRT Plan Cont to trend LFTs Rifaximine  and lactulose  as above Holding home propranolol  and spiro   Intermittent Fluid and electrolyte imbalance Plan Cont to trend  chems  Critical illness related protein calorie malnutrition. Liquid stools - Appreciate RD consult, post-pyloric placed   Plan Trickle feeds via cortrak  Dextrose  replacement Stopping stool softeners. Already on lactulose  and think gut edema affecting absorption   Chronic macrocytic anemia .  Status post 1 unit PRBC 9/16. Hgb down to 7 plan Will x fuse today  Holding ac  F/u am cbc  Acute on chronic Thrombocytopenia 2/2 sepsis superimposed on cirrhosis  -got PLTs for procedure 9/17, and again prior to paracentesis on 9/18, zyvox  stopped 9/18 PLTs  52-->43 Plan Trend  SCDs  Coagulopathy (septic coagulopathy superimposed on  decompensated cirrhosis) INR remains in 3 range.  Plan Trend INR.  Vit K   Hypothyroidism Plan Continue levothyroxine  88 mcg  Glycemic control. Was hypoglycemic. Now on steroids Plan Trend cbgs q4  cellulitis of LLE w diffuse ecchymosis Neg for DVT on 9/17, felt exacerbated by hydrostatic pressure and volume overload. Evaluated by vascular on 9/18 and 9/19. No concern for limb ischemia. 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 Tobie with [facility] 9/16, who related that he has a no suspicion for mastoiditis after reviewing CT imaging.  Noted that patient has a packing wick in place and TM is open, so fluid would be expected but does not suspect mastoiditis based on radiography. plan Ciprodex  drops changed to ofloxacin  (family request and cleared w/ ENT) Monitor postauricular area for proptosis   My critical care time 37 minutes

## 2024-09-07 NOTE — Progress Notes (Signed)
 PM rounds   Coox > 70on levophed  5 and norepi  1 Fiop2 40% Made 12cc urine on CRRT Seen by ID Sisters at bedside Now with panda and on TF trickle No active blee  OFf fent gtt -> rASS still -4  Plan  - keep fent gtt off - dc from mar  =do fent prn  - do versed  prn - keep epi gtt off -> recheck Coox at 8pm - goal coox > 70 - MAP goal >60 + SBP goal > 105  Sisters updated D/w Rn  15 min additonal ccm time    SIGNATURE    Dr. Dorethia Cave, M.D., F.C.C.P,  Pulmonary and Critical Care Medicine Staff Physician, [facility] Center Director - Interstitial Lung Disease  Program  Pulmonary Fibrosis [facility] at [facility] [place], [place], [address]   Pager: [phone], If no answer  -> Check AMION or Try [phone] Telephone (clinical office): [phone] Telephone (research): [phone]  5:38 PM 09/07/2024

## 2024-09-08 ENCOUNTER — Inpatient Hospital Stay (HOSPITAL_COMMUNITY)

## 2024-09-08 LAB — RENAL FUNCTION PANEL
Albumin: 4.6 g/dL (ref 3.5–5.0)
Albumin: 4.6 g/dL (ref 3.5–5.0)
Anion gap: 17 — ABNORMAL HIGH (ref 5–15)
Anion gap: 14 (ref 5–15)
BUN: 14 mg/dL (ref 6–20)
BUN: 17 mg/dL (ref 6–20)
CO2: 21 mmol/L — ABNORMAL LOW (ref 22–32)
CO2: 23 mmol/L (ref 22–32)
Calcium: 8.6 mg/dL — ABNORMAL LOW (ref 8.9–10.3)
Calcium: 8.5 mg/dL — ABNORMAL LOW (ref 8.9–10.3)
Chloride: 100 mmol/L (ref 98–111)
Chloride: 103 mmol/L (ref 98–111)
Creatinine, Ser: 1.12 mg/dL — ABNORMAL HIGH (ref 0.44–1.00)
Creatinine, Ser: 1.12 mg/dL — ABNORMAL HIGH (ref 0.44–1.00)
GFR, Estimated: 56 mL/min — ABNORMAL LOW (ref >60)
GFR, Estimated: 56 mL/min — ABNORMAL LOW (ref >60)
Glucose, Bld: 137 mg/dL — ABNORMAL HIGH (ref 70–99)
Glucose, Bld: 178 mg/dL — ABNORMAL HIGH (ref 70–99)
Phosphorus: 1.8 mg/dL — ABNORMAL LOW (ref 2.5–4.6)
Phosphorus: 2.9 mg/dL (ref 2.5–4.6)
Potassium: 3.6 mmol/L (ref 3.5–5.1)
Potassium: 3.4 mmol/L — ABNORMAL LOW (ref 3.5–5.1)
Sodium: 138 mmol/L (ref 135–145)
Sodium: 140 mmol/L (ref 135–145)

## 2024-09-08 LAB — HEPATIC FUNCTION PANEL
ALT: 39 U/L (ref 0–44)
AST: 86 U/L — ABNORMAL HIGH (ref 15–41)
Albumin: 4.5 g/dL (ref 3.5–5.0)
Alkaline Phosphatase: 63 U/L (ref 38–126)
Bilirubin, Direct: 7.1 mg/dL — ABNORMAL HIGH (ref 0.0–0.2)
Indirect Bilirubin: 9.9 mg/dL — ABNORMAL HIGH (ref 0.3–0.9)
Total Bilirubin: 17 mg/dL — ABNORMAL HIGH (ref 0.0–1.2)
Total Protein: 6.3 g/dL — ABNORMAL LOW (ref 6.5–8.1)

## 2024-09-08 LAB — POCT I-STAT 7, (LYTES, BLD GAS, ICA,H+H)
Acid-Base Excess: 1 mmol/L (ref 0.0–2.0)
Bicarbonate: 23.9 mmol/L (ref 20.0–28.0)
Calcium, Ion: 1.07 mmol/L — ABNORMAL LOW (ref 1.15–1.40)
HCT: 20 % — ABNORMAL LOW (ref 36.0–46.0)
Hemoglobin: 6.8 g/dL — CL (ref 12.0–15.0)
O2 Saturation: 99 %
Patient temperature: 36.8
Potassium: 3.6 mmol/L (ref 3.5–5.1)
Sodium: 138 mmol/L (ref 135–145)
TCO2: 25 mmol/L (ref 22–32)
pCO2 arterial: 31 mmHg — ABNORMAL LOW (ref 32–48)
pH, Arterial: 7.493 — ABNORMAL HIGH (ref 7.35–7.45)
pO2, Arterial: 142 mmHg — ABNORMAL HIGH (ref 83–108)

## 2024-09-08 LAB — GLUCOSE, CAPILLARY
Glucose-Capillary: 134 mg/dL — ABNORMAL HIGH (ref 70–99)
Glucose-Capillary: 143 mg/dL — ABNORMAL HIGH (ref 70–99)
Glucose-Capillary: 163 mg/dL — ABNORMAL HIGH (ref 70–99)
Glucose-Capillary: 164 mg/dL — ABNORMAL HIGH (ref 70–99)
Glucose-Capillary: 154 mg/dL — ABNORMAL HIGH (ref 70–99)
Glucose-Capillary: 150 mg/dL — ABNORMAL HIGH (ref 70–99)

## 2024-09-08 LAB — CBC
HCT: 20.6 % — ABNORMAL LOW (ref 36.0–46.0)
Hemoglobin: 7 g/dL — ABNORMAL LOW (ref 12.0–15.0)
MCH: 36.5 pg — ABNORMAL HIGH (ref 26.0–34.0)
MCHC: 34 g/dL (ref 30.0–36.0)
MCV: 107.3 fL — ABNORMAL HIGH (ref 80.0–100.0)
Platelets: 43 K/uL — ABNORMAL LOW (ref 150–400)
RBC: 1.92 MIL/uL — ABNORMAL LOW (ref 3.87–5.11)
RDW: 24 % — ABNORMAL HIGH (ref 11.5–15.5)
WBC: 22.6 K/uL — ABNORMAL HIGH (ref 4.0–10.5)
nRBC: 0 % (ref 0.0–0.2)

## 2024-09-08 LAB — MAGNESIUM: Magnesium: 2.7 mg/dL — ABNORMAL HIGH (ref 1.7–2.4)

## 2024-09-08 LAB — SEDIMENTATION RATE: Sed Rate: 4 mm/h (ref 0–22)

## 2024-09-08 LAB — COOXEMETRY PANEL
Carboxyhemoglobin: 1.9 % — ABNORMAL HIGH (ref 0.5–1.5)
Methemoglobin: <0.7 % (ref 0.0–1.5)
O2 Saturation: 95.7 %
Total hemoglobin: 7.3 g/dL — ABNORMAL LOW (ref 12.0–16.0)

## 2024-09-08 LAB — CALCIUM, IONIZED: Calcium, Ionized, Serum: 4.4 mg/dL — ABNORMAL LOW (ref 4.5–5.6)

## 2024-09-08 LAB — PROTIME-INR
INR: 3.2 — ABNORMAL HIGH (ref 0.8–1.2)
Prothrombin Time: 34.6 s — ABNORMAL HIGH (ref 11.4–15.2)

## 2024-09-08 LAB — LACTIC ACID, PLASMA: Lactic Acid, Venous: 4.6 mmol/L (ref 0.5–1.9)

## 2024-09-08 LAB — PHOSPHORUS: Phosphorus: 1.8 mg/dL — ABNORMAL LOW (ref 2.5–4.6)

## 2024-09-08 LAB — C-REACTIVE PROTEIN: CRP: 8.1 mg/dL — ABNORMAL HIGH (ref <1.0)

## 2024-09-08 LAB — PREPARE RBC (CROSSMATCH)

## 2024-09-08 MED ORDER — OFLOXACIN 0.3 % OP SOLN
5.0000 [drp] | Freq: Every day | OPHTHALMIC | Status: DC
  Administered 2024-09-09 – 2024-09-17 (×9): 5 [drp] via OTIC
  Filled 2024-09-08: qty 5

## 2024-09-08 MED ORDER — SODIUM CHLORIDE 0.9% IV SOLUTION: Freq: Once | INTRAVENOUS | Status: AC

## 2024-09-08 MED ORDER — DIPHENHYDRAMINE HCL 50 MG/ML IJ SOLN: 25.0000 mg | INTRAMUSCULAR | Status: DC | PRN

## 2024-09-08 MED ORDER — HYDROCORTISONE SOD SUC (PF) 100 MG IJ SOLR
100.0000 mg | Freq: Two times a day (BID) | INTRAMUSCULAR | Status: DC
Start: 2024-09-08 — End: 2024-09-09
  Administered 2024-09-08 – 2024-09-09 (×2): 100 mg via INTRAVENOUS
  Filled 2024-09-08 (×2): qty 2

## 2024-09-08 MED ORDER — SODIUM CHLORIDE 0.9 % IV SOLN
30.0000 mmol | Freq: Once | INTRAVENOUS | Status: AC
  Administered 2024-09-08: 30 mmol via INTRAVENOUS
  Filled 2024-09-08: qty 10

## 2024-09-08 NOTE — Progress Notes (Signed)
 Pt transported on ventilator from 3M11 to CT and back on ventilator w/o complications. Ambu bag taken on transport.

## 2024-09-08 NOTE — Progress Notes (Signed)
    PM  Updated husband 0 he felt she was more respinsive CT abd - nil acute CT temp bones   - left side improving   - right side small effusion new  Plan  =- husband updated  - signedo ut to Dr Layman - add ear drops on right side too     SIGNATURE    Dr. Dorethia Cave, M.D., F.C.C.P,  Pulmonary and Critical Care Medicine Staff Physician, [facility] Center Director - Interstitial Lung Disease  Program  Pulmonary Fibrosis [facility] at [facility] [place], [place], [address]   Pager: [phone], If no answer  -> Check AMION or Try [phone] Telephone (clinical office): [phone] Telephone (research): [phone]  6:37 PM 09/08/2024

## 2024-09-08 NOTE — Progress Notes (Signed)
 [facility] Progress Note  Name: Rachel Lucas MRN: [id number] DOB: 1961-08-12  Chief Complaint:  Leg swelling and chills  Subjective:  Seen and examined on CRRT.  Procedure supervised.  She had 3.0 liters UF over 9/19 with CRRT.  She had 450 mL emesis/NG output charted.  She has been on levo at 5 mcg/min, epi is off, and vasopressin  is at 0.04 units/min.  She had 10 mL UOP over 9/19 charted.  Her childhood friend is at bedside.  I called her husband and updated him this morning  Review of systems:  Unable to obtain 2/2 intubated and sedated   ----------------------- Background on consult:  Rachel Lucas is a 63 y.o. female with a history of hypothyroidism and recent diagnosis of alcoholic cirrhosis who presented to the hospital with chills and leg swelling.  She also left lower extremity erythema.  She was diagnosed with cellulitis.  She was also found to have otitis media with moderate effusion.  Unfortunately her course was complicated by a PEA arrest on 9/16.  She did achieve ROSC - per charting had 6-7 minutes of CPR.  She is currently intubated and on norepinephrine  9 mcg/min, epinephrine  at 6 mcg/min, and vasopressin  at 0.04 units/min.  Course has also been complicated by ARDS, DIC as well as AKI.  Nephrology is consulted for assistance with management of AKI.  Pulmonary critical care is concerned that she is nearing the need for CRRT.  She had 50 mL UOP over 9/16 charted.  She has been on a bicarb gtt at 125 ml/hr.  Note that IR was consulted for a possible paracentesis - they did not feel she was stable for same per their note.  She has been on vanc and zosyn  and pharmacy is consulted for dosing of the same.  Team has a right femoral central line and art line.  They had attempted a left internal jugular central line earlier and this was not able to be placed.    I spoke with her husband at bedside and later her husband, her sister, brother-in-law, and niece in the  family room in the ICU.  We discussed the risks/benefits/indications for CRRT.  At this time, they would like to talk as a family to decide how to proceed.    Intake/Output Summary (Last 24 hours) at 09/08/2024 0711 Last data filed at 09/08/2024 0600 Gross per 24 hour  Intake 1653.6 ml  Output 3368 ml  Net -1714.4 ml    Vitals:  Vitals:   09/08/24 0445 09/08/24 0500 09/08/24 0501 09/08/24 0545  BP:      Pulse: 73   77  Resp: (!) 24   20  Temp: 98.4 F (36.9 C)   98.8 F (37.1 C)  TempSrc:      SpO2: 100%  100% 100%  Weight:  61.9 kg    Height:         Physical Exam:    General adult female critically ill and intubated HEENT normocephalic blood noted in and around her mouth; sclera are icteric Neck supple trachea midline Lungs coarse mechanical breath sounds; FIO2 40 and PEEP 8 Heart S1S2 no rub Abdomen softer and less distended; unable to assess tenderness 2/2 sedation Extremities 2-3+ edema bilateral lower extremities; softer; left lower leg wrapped with gauze. Stigmata of chronic liver disease Neuro continuous sedation running GU foley catheter in place   Medications reviewed   Labs:     Latest Ref Rng & Units 09/08/2024    3:22  AM 09/08/2024    3:16 AM 09/07/2024    3:45 PM  BMP  Glucose 70 - 99 mg/dL  862  882   BUN 6 - 20 mg/dL  14  12   Creatinine 9.55 - 1.00 mg/dL  8.87  8.68   Sodium 864 - 145 mmol/L 138  138  137   Potassium 3.5 - 5.1 mmol/L 3.6  3.6  3.8   Chloride 98 - 111 mmol/L  100  101   CO2 22 - 32 mmol/L  21  20   Calcium  8.9 - 10.3 mg/dL  8.6  8.5      Assessment/Plan:    # AKI  - Secondary to ischemic ATN from her arrest.  Given that she is post-arrest and anuric and in shock, this is unfortunately expected to worsen.  Baseline Cr less than 1.  Renal US  with bilateral increased echogenicity.  Initiated on CRRT on 9/17 after nontunneled catheter with critical care for acidosis and shock with worsening AKI and volume status  ------------- -  Continue CRRT.  Set dialysate at 1.2 liters/hr    - 4K fluids  - Continue UF goal at net negative 50 mL/hr as tolerated     # Septic shock  - Pressors per primary team  - antibiotics per primary team and pharmacy is consulted for dosing - on zosyn     # PEA arrest  - supportive post-arrest care    # Hyponatremia  - Improved with CRRT.    # ARDS  - Vent per critical care  - Optimize volume status with CRRT    # Metabolic acidosis - She is on CRRT - s/p bicarb gtt - now off  - Treat shock and underlying etiology as above   # Cirrhosis - decompensated  - she is on albumin  and levophed   - IR performed bedside paracentesis on 9/18 with 1.3 liters     # Hypophosphatemia  - hyperphosphatemia improved with CRRT   - now with hypophosphatemia - give sodium phosphate     # Macrocytic anemia  - Concern for DIC - PRBC's per primary team threshold.  Note that she has a history of a prior transfusion reaction - has gotten without issue this week- will give PRBC's - With past heavy alcohol use there is concern for nutrient deficiency, as well  - Markedly elevated ferritin on check in August and iron sats not calculated.  Folate low at 3.2.  she is on folic acid  and thiamine  as well.  B12 ok    # Thrombocytopenia  - Note that she has had oozing from some IV sites per nursing - Treating DIC per critical care team    Disposition - continue ICU monitoring    Katheryn JAYSON Saba, MD 09/08/2024 7:45 AM

## 2024-09-08 NOTE — Plan of Care (Signed)
  Problem: Clinical Measurements: Goal: Ability to maintain clinical measurements within normal limits will improve Outcome: Progressing Goal: Will remain free from infection Outcome: Progressing Goal: Diagnostic test results will improve Outcome: Progressing Goal: Respiratory complications will improve Outcome: Progressing Goal: Cardiovascular complication will be avoided Outcome: Progressing   Problem: Nutrition: Goal: Adequate nutrition will be maintained Outcome: Progressing   Problem: Coping: Goal: Level of anxiety will decrease Outcome: Progressing   Problem: Elimination: Goal: Will not experience complications related to bowel motility Outcome: Progressing Goal: Will not experience complications related to urinary retention Outcome: Progressing   Problem: Pain Managment: Goal: General experience of comfort will improve and/or be controlled Outcome: Progressing   Problem: Safety: Goal: Ability to remain free from injury will improve Outcome: Progressing   Problem: Skin Integrity: Goal: Risk for impaired skin integrity will decrease Outcome: Progressing   Problem: Fluid Volume: Goal: Hemodynamic stability will improve Outcome: Progressing   Problem: Clinical Measurements: Goal: Diagnostic test results will improve Outcome: Progressing Goal: Signs and symptoms of infection will decrease Outcome: Progressing   Problem: Respiratory: Goal: Ability to maintain adequate ventilation will improve Outcome: Progressing     Problem: Education: Goal: Knowledge of General Education information will improve Description: Including pain rating scale, medication(s)/side effects and non-pharmacologic comfort measures Outcome: Not Progressing   Problem: Health Behavior/Discharge Planning: Goal: Ability to manage health-related needs will improve Outcome: Not Progressing   Problem: Activity: Goal: Risk for activity intolerance will decrease Outcome: Not Progressing      Problem: Safety: Goal: Non-violent Restraint(s) Outcome: Completed/Met

## 2024-09-09 DIAGNOSIS — R6521 Severe sepsis with septic shock: Secondary | ICD-10-CM | POA: Diagnosis not present

## 2024-09-09 DIAGNOSIS — K7469 Other cirrhosis of liver: Secondary | ICD-10-CM | POA: Diagnosis not present

## 2024-09-09 DIAGNOSIS — A419 Sepsis, unspecified organism: Secondary | ICD-10-CM | POA: Diagnosis not present

## 2024-09-09 DIAGNOSIS — J9601 Acute respiratory failure with hypoxia: Secondary | ICD-10-CM | POA: Diagnosis not present

## 2024-09-09 DIAGNOSIS — K766 Portal hypertension: Secondary | ICD-10-CM | POA: Diagnosis not present

## 2024-09-09 DIAGNOSIS — K7031 Alcoholic cirrhosis of liver with ascites: Secondary | ICD-10-CM

## 2024-09-09 DIAGNOSIS — E8721 Acute metabolic acidosis: Secondary | ICD-10-CM | POA: Diagnosis not present

## 2024-09-09 DIAGNOSIS — D696 Thrombocytopenia, unspecified: Secondary | ICD-10-CM

## 2024-09-09 LAB — CALCIUM, IONIZED: Calcium, Ionized, Serum: 4.8 mg/dL (ref 4.5–5.6)

## 2024-09-09 LAB — RENAL FUNCTION PANEL
Albumin: 4.6 g/dL (ref 3.5–5.0)
Albumin: 4.8 g/dL (ref 3.5–5.0)
Anion gap: 11 (ref 5–15)
Anion gap: 15 (ref 5–15)
BUN: 16 mg/dL (ref 6–20)
BUN: 16 mg/dL (ref 6–20)
CO2: 23 mmol/L (ref 22–32)
CO2: 23 mmol/L (ref 22–32)
Calcium: 8.5 mg/dL — ABNORMAL LOW (ref 8.9–10.3)
Calcium: 8.6 mg/dL — ABNORMAL LOW (ref 8.9–10.3)
Chloride: 105 mmol/L (ref 98–111)
Chloride: 102 mmol/L (ref 98–111)
Creatinine, Ser: 0.94 mg/dL (ref 0.44–1.00)
Creatinine, Ser: 1 mg/dL (ref 0.44–1.00)
GFR, Estimated: >60 mL/min (ref >60)
GFR, Estimated: >60 mL/min (ref >60)
Glucose, Bld: 168 mg/dL — ABNORMAL HIGH (ref 70–99)
Glucose, Bld: 184 mg/dL — ABNORMAL HIGH (ref 70–99)
Phosphorus: 2.1 mg/dL — ABNORMAL LOW (ref 2.5–4.6)
Phosphorus: 3.2 mg/dL (ref 2.5–4.6)
Potassium: 3.3 mmol/L — ABNORMAL LOW (ref 3.5–5.1)
Potassium: 3.1 mmol/L — ABNORMAL LOW (ref 3.5–5.1)
Sodium: 139 mmol/L (ref 135–145)
Sodium: 140 mmol/L (ref 135–145)

## 2024-09-09 LAB — DIC (DISSEMINATED INTRAVASCULAR COAGULATION)PANEL
D-Dimer, Quant: 11.51 ug{FEU}/mL — ABNORMAL HIGH (ref 0.00–0.50)
Fibrinogen: 132 mg/dL — ABNORMAL LOW (ref 210–475)
INR: 3.1 — ABNORMAL HIGH (ref 0.8–1.2)
Platelets: 19 K/uL — CL (ref 150–400)
Prothrombin Time: 33.5 s — ABNORMAL HIGH (ref 11.4–15.2)
aPTT: 51 s — ABNORMAL HIGH (ref 24–36)

## 2024-09-09 LAB — CBC WITH DIFFERENTIAL/PLATELET
Abs Immature Granulocytes: 0.38 K/uL — ABNORMAL HIGH (ref 0.00–0.07)
Basophils Absolute: 0.1 K/uL (ref 0.0–0.1)
Basophils Relative: 0 %
Eosinophils Absolute: 0 K/uL (ref 0.0–0.5)
Eosinophils Relative: 0 %
HCT: 21.1 % — ABNORMAL LOW (ref 36.0–46.0)
Hemoglobin: 7.2 g/dL — ABNORMAL LOW (ref 12.0–15.0)
Immature Granulocytes: 2 %
Lymphocytes Relative: 4 %
Lymphs Abs: 0.7 K/uL (ref 0.7–4.0)
MCH: 34.6 pg — ABNORMAL HIGH (ref 26.0–34.0)
MCHC: 34.1 g/dL (ref 30.0–36.0)
MCV: 101.4 fL — ABNORMAL HIGH (ref 80.0–100.0)
Monocytes Absolute: 0.9 K/uL (ref 0.1–1.0)
Monocytes Relative: 5 %
Neutro Abs: 16.2 K/uL — ABNORMAL HIGH (ref 1.7–7.7)
Neutrophils Relative %: 89 %
Platelets: 24 K/uL — CL (ref 150–400)
RBC: 2.08 MIL/uL — ABNORMAL LOW (ref 3.87–5.11)
RDW: 24.9 % — ABNORMAL HIGH (ref 11.5–15.5)
WBC: 18.2 K/uL — ABNORMAL HIGH (ref 4.0–10.5)
nRBC: 0.1 % (ref 0.0–0.2)

## 2024-09-09 LAB — TYPE AND SCREEN
ABO/RH(D): O POS
Antibody Screen: NEGATIVE
Unit division: 0

## 2024-09-09 LAB — CBC
HCT: 22.2 % — ABNORMAL LOW (ref 36.0–46.0)
Hemoglobin: 7.6 g/dL — ABNORMAL LOW (ref 12.0–15.0)
MCH: 34.7 pg — ABNORMAL HIGH (ref 26.0–34.0)
MCHC: 34.2 g/dL (ref 30.0–36.0)
MCV: 101.4 fL — ABNORMAL HIGH (ref 80.0–100.0)
Platelets: 20 K/uL — CL (ref 150–400)
RBC: 2.19 MIL/uL — ABNORMAL LOW (ref 3.87–5.11)
RDW: 24.6 % — ABNORMAL HIGH (ref 11.5–15.5)
WBC: 20.2 K/uL — ABNORMAL HIGH (ref 4.0–10.5)
nRBC: 0.1 % (ref 0.0–0.2)

## 2024-09-09 LAB — BPAM RBC
Blood Product Expiration Date: 202509272359
ISSUE DATE / TIME: 202509201100
Unit Type and Rh: 5100

## 2024-09-09 LAB — HEPATIC FUNCTION PANEL
ALT: 43 U/L (ref 0–44)
AST: 88 U/L — ABNORMAL HIGH (ref 15–41)
Albumin: 4.6 g/dL (ref 3.5–5.0)
Alkaline Phosphatase: 71 U/L (ref 38–126)
Bilirubin, Direct: 7.7 mg/dL — ABNORMAL HIGH (ref 0.0–0.2)
Indirect Bilirubin: 10.7 mg/dL — ABNORMAL HIGH (ref 0.3–0.9)
Total Bilirubin: 18.4 mg/dL (ref 0.0–1.2)
Total Protein: 6.2 g/dL — ABNORMAL LOW (ref 6.5–8.1)

## 2024-09-09 LAB — GLUCOSE, CAPILLARY
Glucose-Capillary: 158 mg/dL — ABNORMAL HIGH (ref 70–99)
Glucose-Capillary: 152 mg/dL — ABNORMAL HIGH (ref 70–99)
Glucose-Capillary: 166 mg/dL — ABNORMAL HIGH (ref 70–99)
Glucose-Capillary: 164 mg/dL — ABNORMAL HIGH (ref 70–99)
Glucose-Capillary: 162 mg/dL — ABNORMAL HIGH (ref 70–99)

## 2024-09-09 LAB — MAGNESIUM: Magnesium: 2.6 mg/dL — ABNORMAL HIGH (ref 1.7–2.4)

## 2024-09-09 LAB — TECHNOLOGIST SMEAR REVIEW: Plt Morphology: DECREASED

## 2024-09-09 LAB — COOXEMETRY PANEL
Carboxyhemoglobin: 2.6 % — ABNORMAL HIGH (ref 0.5–1.5)
Methemoglobin: <0.7 % (ref 0.0–1.5)
O2 Saturation: 89.7 %
Total hemoglobin: 7.9 g/dL — ABNORMAL LOW (ref 12.0–16.0)

## 2024-09-09 LAB — RETICULOCYTES
Immature Retic Fract: 10.9 % (ref 2.3–15.9)
RBC.: 2.07 MIL/uL — ABNORMAL LOW (ref 3.87–5.11)
Retic Count, Absolute: 38.3 K/uL (ref 19.0–186.0)
Retic Ct Pct: 1.9 % (ref 0.4–3.1)

## 2024-09-09 LAB — LACTIC ACID, PLASMA: Lactic Acid, Venous: 2.4 mmol/L (ref 0.5–1.9)

## 2024-09-09 LAB — LACTATE DEHYDROGENASE: LDH: 280 U/L — ABNORMAL HIGH (ref 98–192)

## 2024-09-09 MED ORDER — VITAMIN K1 10 MG/ML IJ SOLN
10.0000 mg | Freq: Once | INTRAVENOUS | Status: AC
  Administered 2024-09-09: 10 mg via INTRAVENOUS
  Filled 2024-09-09: qty 1

## 2024-09-09 MED ORDER — POTASSIUM CHLORIDE CRYS ER 20 MEQ PO TBCR
30.0000 meq | EXTENDED_RELEASE_TABLET | ORAL | Status: DC
  Filled 2024-09-09: qty 1

## 2024-09-09 MED ORDER — HYDROCORTISONE SOD SUC (PF) 100 MG IJ SOLR
50.0000 mg | Freq: Two times a day (BID) | INTRAMUSCULAR | Status: AC
Start: 2024-09-09 — End: 2024-09-10
  Administered 2024-09-09 – 2024-09-10 (×2): 50 mg via INTRAVENOUS
  Filled 2024-09-09 (×2): qty 2

## 2024-09-09 MED ORDER — POTASSIUM CHLORIDE 10 MEQ/100ML IV SOLN
10.0000 meq | INTRAVENOUS | Status: AC
  Administered 2024-09-09 (×3): 10 meq via INTRAVENOUS
  Filled 2024-09-09 (×3): qty 100

## 2024-09-09 MED ORDER — METRONIDAZOLE 500 MG/100ML IV SOLN
500.0000 mg | Freq: Two times a day (BID) | INTRAVENOUS | Status: DC
  Administered 2024-09-09 – 2024-09-11 (×6): 500 mg via INTRAVENOUS
  Filled 2024-09-09 (×6): qty 100

## 2024-09-09 MED ORDER — SODIUM CHLORIDE 0.9 % IV SOLN
2.0000 g | INTRAVENOUS | Status: DC
  Administered 2024-09-09 – 2024-09-11 (×3): 2 g via INTRAVENOUS
  Filled 2024-09-09 (×3): qty 20

## 2024-09-09 MED ORDER — POTASSIUM CHLORIDE 10 MEQ/50ML IV SOLN
10.0000 meq | INTRAVENOUS | Status: AC
Start: 2024-09-09 — End: 2024-09-10
  Administered 2024-09-09 – 2024-09-10 (×3): 10 meq via INTRAVENOUS
  Filled 2024-09-09 (×3): qty 50

## 2024-09-09 MED ORDER — SODIUM CHLORIDE 0.9% IV SOLUTION: Freq: Once | INTRAVENOUS | Status: AC

## 2024-09-09 MED ORDER — SODIUM PHOSPHATES 45 MMOLE/15ML IV SOLN
30.0000 mmol | Freq: Once | INTRAVENOUS | Status: AC
  Administered 2024-09-09: 30 mmol via INTRAVENOUS
  Filled 2024-09-09: qty 10

## 2024-09-09 MED ORDER — HYDROCORTISONE SOD SUC (PF) 100 MG IJ SOLR
25.0000 mg | Freq: Two times a day (BID) | INTRAMUSCULAR | Status: AC
  Administered 2024-09-10 – 2024-09-11 (×2): 25 mg via INTRAVENOUS
  Filled 2024-09-09 (×2): qty 2

## 2024-09-09 MED ORDER — VITAL 1.5 CAL PO LIQD
1000.0000 mL | ORAL | Status: DC
Start: 2024-09-09 — End: 2024-09-10
  Administered 2024-09-09: 1000 mL
  Filled 2024-09-09: qty 1000

## 2024-09-09 NOTE — Progress Notes (Addendum)
 NAME:  Rachel Lucas, MRN:  [id number], DOB:  March 28, 1961, LOS: 6 ADMISSION DATE:  09/03/2024, CONSULTATION DATE:  09/05/2024 REFERRING MD:  EDP, CHIEF COMPLAINT:  Left leg pain   History of Present Illness:  Patient is a 63 yo female with new diagnosis of alcoholic cirrhosis with septic shock thought 2/2 LLE cellulitis, does have persistent otitis media from recent prior admission for Klebsiella pneumonia and bacteremia.    Patient had concern for single episode of melena the day prior to admission, but subsequently had normal stools.  Had no cough, congestion, or hematemesis.  Did endorse worsening LLE swelling and redness with some pain the day of admission.   Recently, patient was admitted for Klebciella bacteremia and pneumonia in setting of accidental ear canal perforation with Q-tip (8/20-8/27).   In the ED, patient was unresponsive to fluids with BP 60/30s and PCCM was consulted for admission.  Patient stated that her baseline SBP is in the 90s.   Pertinent  Medical History  Fam hx of VWD but test 8/25 neg Alcoholic cirrhosis recent new diagnosis Sober for 1 month per self and husband   Significant Hospital Events: Including procedures, antibiotic start and stop dates in addition to other pertinent events   9/15 - Admitted, working dx LE cellulitis and SBP in setting of nex dx of ETOH related cirrhosis.  started Zosyn  (9/15-) and linezolid  (9/15-), GI consulted with no concern for GIB, CVC and Art line placed in PM 9/16 - Worsening delirium, PEA arrest for 6 minutes with ROSC, NG tube in place 9/17 EEG neg for seizure. ECHO: EF 35-40% mod dec'd LVF, pardoxical septal motion, mod to svr MR and TR. IVC w/ < 50% variability. Became hyperglycemic after switch to D10 fluids. Had minimal urine output at 50 mL. Has not been agitated, occasionally spontaneously moves extremities. Nephro consulted. HD cath placed.  CRRT started. IR consulted. Felt not stable for para. Stating:  coagulopathy, shock and hypothermia as factors. IPAL: full code. 9/18 had been off epinephrine  overnight. Back on it in am. Lactic acid 9-->7. Na 123->130, LFTs AST down trending but tbili up, hgb 8->7.6. paracentesis. Neg for SBP.  Vascular consulted. No role for surg. No evidence of limb ischemia  9/19 ID consulted, added inflammatory marker eval raising concern for vasculitis. Pressors down significantly after starting the epinephrine . Cortrak placed. For intolerance of TF, inflammatory eval: sed rate 4, lactate 6.6->4.6. tbili 17 (rising from 11.9). wbc still elevated. Hgb 7.5 to 7. Plts 49->43 9/20 hemodynamics a little better. Still w/ sig WBC ct. Prob steroids but repeating abd US  and CT imaging of head to r/o mastoiditis. While there getting CT head for neuro prognostics 9/21: All CT imaging including CT abdomen pelvis as well as CT temporal bone all reassuring no new evidence of infection.  Ultrasound abdomen negative.  Platelets down again, sending additional eval to rule out TTP.  Getting more platelets transfused. Change abx to ctx and flagyl    Interim History / Subjective:  Remains on CRRT Overall looking better.  Vasopressin  down to 0.03, however not able to wean off without requiring escalating doses of norepinephrine . Platelet counts down again All diagnostic and imaging from yesterday looked reassuring Objective    Blood pressure 93/63, pulse 72, temperature 98.6 F (37 C), resp. rate 20, height 5' 3 (1.6 m), weight 61 kg, SpO2 100%. CVP:  [2 mmHg-10 mmHg] 3 mmHg  Vent Mode: PRVC FiO2 (%):  [40 %-100 %] 40 % Set Rate:  [20 bmp]  20 bmp Vt Set:  [420 mL] 420 mL PEEP:  [8 cmH20] 8 cmH20 Plateau Pressure:  [20 cmH20-22 cmH20] 22 cmH20   Intake/Output Summary (Last 24 hours) at 09/09/2024 0750 Last data filed at 09/09/2024 0700 Gross per 24 hour  Intake 1873.21 ml  Output 3626 ml  Net -1752.79 ml   Filed Weights   09/07/24 0500 09/08/24 0500 09/09/24 0500  Weight: 68.8 kg  61.9 kg 61 kg    Examination: General This is a critically ill 63 year old female who remains on full ventilator support still requiring low doses of norepinephrine  and vasopressin  infusion she remains on CRRT HEENT temporal wasting, orally intubated bloody oral secretions oozing from the dialysis catheter Pulmonary diminished bases currently Plateau pressure at 20 pulse oximetry in the high 90s Cardiac regular rate rhythm Abdomen soft, tolerating postpyloric tube feeds Extremities diffuse anasarca slowly improving, the left lower extremity dressing is clean dry and intact, she has diffuse areas of ecchymosis Neuro intermittently agitated moves all extremities no focal deficits GU minimal urine output. Resolved problem list   hyponatremia in setting of cirrhosis and volume overload.   Assessment and Plan   Septic shock w/ lactic acidosis  2/2 GNR bacteremia: working dx cellulitis, further complicated by acute systolic heart failure, and demand ischemia w/ elevated trop I  -not candidate for Left heart cath; not candidate for [facility] given coagulopathy - ? CM from sepsis or etoh?  - lactate slow to clear but suspect her liver fxn playing a role -all f/u imaging on 9/20 reassuring (temp bone CT no mastitis, CT abd/pelvis neg) Plan Continue to wean norepinephrine  for mean arterial pressure greater than 65 Keeping her vasopressin  at 0.03 today Stress dose steroids decreased to q 12 on 9/21; will place taper order Day 7 zosyn , GNR in blood but still no identification of organism. F/u cultures have been neg. W/ her worsening thrombocytopenia will change to CTX and flagyl  No GDMT given shock state  Will continue albumin  again today to facilitate with third spacing removal on CRRT   Acute hypoxic respiratory failure 2/2 Acute lung injury, ARDS,  ? 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 TBil had been rising. APO4 OK. Other liver indices stable. Suspect bili reflects lvr dz and hemolysis from CRRT -not candidate for xplant -(had sludge on US  on 16th) f/u abd US  still w/ sludge but ne evidence of stones or cystitis.  Plan Trending INR; send hepatic panel in am  Rifaximine and lactulose  as above Holding home propranolol  and spiro   Intermittent Fluid and electrolyte imbalance: hypophosphatemia and hypokalemia  Plan Replaced; will  recheck   Critical illness related protein calorie malnutrition. Liquid stools - Appreciate RD consult, post-pyloric placed  Plan Cortrak will adv tf to 30ml/hr from 20 Stopped stool softeners as she is on lactulose  and think gut edema affecting absorption   Chronic macrocytic anemia . 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 Prob reflects on-going DIC but will make sure not missing TTP -got PLTs for procedure 9/17, and again prior  to paracentesis on 9/18, zyvox  stopped 9/18 PLTs 52-->43-->19 Plan Trend; trigger is 20 or less so will xfuse today  If needs procedures threshold <50 Will review w/ pharmacy. ? Zosyn ? See abx change  SCDs Will add blood smear, [id number] activity, LDH, haptoglobin, indirect bili, reticulocyte ct  Coagulopathy (septic coagulopathy/DIC superimposed on  decompensated cirrhosis).  Has elevated factor VIII INR remains in 3 range.  Plan Trend INR.  Vit K   Hypothyroidism Plan Continue levothyroxine  88 mcg  Glycemic control. Was hypoglycemic. Now on steroids  Glycemic control w/in goal Plan Trend cbgs q4 Goal 140-180  cellulitis of LLE w diffuse ecchymosis Neg for DVT on 9/17, felt exacerbated by hydrostatic pressure and volume overload. Evaluated by vascular on 9/18 and 9/19. No concern for limb ischemia. 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 Tobie with [facility] 9/16, who related that he has a no suspicion for mastoiditis after reviewing CT imaging.  Noted that patient has a packing wick in place and TM is open, so fluid would be expected but does not suspect mastoiditis based on radiography. -repeated imaging 9/20 improved aeration of middle ear cavity, left mastoid effusion no evidence of osteo. Small right effusion plan Ciprodex  drops changed to ofloxacin  (family request and cleared w/ ENT) both ears (started 9/20) Monitor postauricular area for proptosis Touch base w/ Tobie Monday re: when packing should be removed.   My critical care time 33 minutes

## 2024-09-09 NOTE — Progress Notes (Signed)
 [name] ROUNDING NOTE   Subjective: Pressor requirement slowly improving.  Lactate improved to 2.4.  Intubated but no longer sedated.  Moving, but not following commands.  Worsening thrombocytopenia, prompting hematology evaluation.  Low suspicion for TTP. Bilirubin has increased significantly over the past few days, and INR remains stable.  Objective: Vital signs in last 24 hours: Temp:  [95.5 F (35.3 C)-99 F (37.2 C)] 97.7 F (36.5 C) (09/21 2115) Pulse Rate:  [58-83] 72 (09/21 2100) Resp:  [17-22] 20 (09/21 2115) BP: (99)/(42) 99/42 (09/21 2018) SpO2:  [95 %-100 %] 100 % (09/21 2100) Arterial Line BP: (82-150)/(28-68) 134/59 (09/21 2115) FiO2 (%):  [40 %] 40 % (09/21 2018) Weight:  [61 kg] 61 kg (09/21 0500) Last BM Date : 09/09/24 General: Critically ill intubated Caucasian female, friend at bedside Lungs:  CTA b/l, no w/r/r Heart:  RRR, no m/r/g Abdomen:  Soft, NT, ND, +BS Ext: Anasarca, left lower extremity wrapped in bandage, not removed    Intake/Output from previous day: 09/20 0701 - 09/21 0700 In: 1873.2 [I.V.:309.1; Blood:402.8; NG/GT:471.3; IV Piggyback:690] Out: 3626 [Urine:10; Emesis/NG output:350; Stool:750] Intake/Output this shift: Total I/O In: 84.3 [I.V.:24.3; NG/GT:60] Out: 600    Lab Results: Recent Labs    09/08/24 0316 09/08/24 0322 09/09/24 0320 09/09/24 0941  WBC 22.6*  --  20.2* 18.2*  HGB 7.0* 6.8* 7.6* 7.2*  PLT 43*  --  19*  20* 24*  MCV 107.3*  --  101.4* 101.4*   BMET Recent Labs    09/08/24 1843 09/09/24 0320 09/09/24 1801  NA 140 139 140  K 3.4* 3.3* 3.1*  CL 103 105 102  CO2 23 23 23   GLUCOSE 178* 168* 184*  BUN 17 16 16   CREATININE 1.12* 0.94 1.00  CALCIUM  8.5* 8.5* 8.6*   LFT Recent Labs    09/08/24 0316 09/08/24 1843 09/09/24 0320 09/09/24 0941 09/09/24 1801  PROT 6.3*  --   --  6.2*  --   ALBUMIN  4.5  4.6   < > 4.6 4.6 4.8  AST 86*  --   --  88*  --   ALT 39  --   --  43  --    ALKPHOS 63  --   --  71  --   BILITOT 17.0*  --   --  18.4*  --   BILIDIR 7.1*  --   --  7.7*  --   IBILI 9.9*  --   --  10.7*  --    < > = values in this interval not displayed.   PT/INR Recent Labs    09/08/24 0316 09/09/24 0320  INR 3.2* 3.1*      Imaging/Other results: US  Abdomen Limited RUQ (LIVER/GB) Result Date: 09/08/2024 CLINICAL DATA:  Septic shock, cirrhosis, ascites EXAM: ULTRASOUND ABDOMEN LIMITED RIGHT UPPER QUADRANT COMPARISON:  CT today FINDINGS: Gallbladder: Layering sludge within the gallbladder. No shadowing echogenic foci to suggest stones. No wall thickening or sonographic Murphy sign. Common bile duct: Diameter: Normal caliber, 5 mm Liver: Nodular contours compatible with cirrhosis. No focal abnormality. Portal vein is patent with reversal of flow. Other: Ascites noted in all 4 quadrants. Bilateral pleural effusions noted. IMPRESSION: Changes of cirrhosis with associated ascites. Sludge within the gallbladder. No evidence of cholelithiasis or acute cholecystitis. Bilateral pleural effusions. Electronically Signed   By: Franky Crease M.D.   On: 09/08/2024 19:43   CT HEAD WO CONTRAST ( ) Result Date: 09/08/2024 CLINICAL DATA:  Anoxic brain damage. EXAM: CT HEAD  WITHOUT CONTRAST TECHNIQUE: Contiguous axial images were obtained from the base of the skull through the vertex without intravenous contrast. RADIATION DOSE REDUCTION: This exam was performed according to the departmental dose-optimization program which includes automated exposure control, adjustment of the mA and/or kV according to patient size and/or use of iterative reconstruction technique. COMPARISON:  None Available. FINDINGS: Brain: There is no evidence of an acute infarct, intracranial hemorrhage, mass, midline shift, or extra-axial fluid collection. There is mild cerebral atrophy. Cerebral white matter hypodensities are nonspecific but compatible with mild chronic small vessel ischemic disease. No cerebral  edema is evident. Vascular: Calcified atherosclerosis at the skull base. No hyperdense vessel. Skull: No acute fracture or suspicious lesion. Sinuses/Orbits: Minimal mucosal thickening in the included paranasal sinuses. Temporal bone reported separately. Unremarkable orbits. Other: None. IMPRESSION: 1. No evidence of acute intracranial abnormality. 2. Mild chronic small vessel ischemic disease and cerebral atrophy. Electronically Signed   By: Dasie Hamburg M.D.   On: 09/08/2024 17:25   CT TEMPORAL BONES WO CONTRAST Result Date: 09/08/2024 CLINICAL DATA:  Mastoiditis. EXAM: CT TEMPORAL BONES WITHOUT CONTRAST TECHNIQUE: Axial and coronal plane CT imaging of the petrous temporal bones was performed with thin-collimation image reconstruction. No intravenous contrast was administered. Multiplanar CT image reconstructions were also generated. RADIATION DOSE REDUCTION: This exam was performed according to the departmental dose-optimization program which includes automated exposure control, adjustment of the mA and/or kV according to patient size and/or use of iterative reconstruction technique. COMPARISON:  CT temporal bones 09/03/2024 FINDINGS: RIGHT TEMPORAL BONE External auditory canal: Normal. Middle ear cavity: Normally aerated. The scutum and ossicles are normal. The tegmen tympani is intact. Inner ear structures: The cochlea, vestibule and semicircular canals are normal. The vestibular aqueduct is not enlarged. Internal auditory and facial nerve canals:  Normal. Mastoid air cells: New mild scattered air cell opacification/small effusion without coalescence. LEFT TEMPORAL BONE External auditory canal: Persistent material throughout the [facility] with chart review documenting a tympanic membrane rupture and wick in place. No interval bone erosion. Middle ear cavity: There is a moderate amount of nonspecific material primarily throughout the mesotympanum with mild extension into the epitympanum, and middle ear cavity  aeration has improved from the prior CT where it was completely opacified. Residual material surrounds the ossicles which appear grossly intact. The scutum and tegmen tympani also appear intact. Inner ear structures: The cochlea, vestibule and semicircular canals are normal. The vestibular aqueduct is not enlarged. Internal auditory and facial nerve canals:  Normal. Mastoid air cells: Moderate air cell opacification/effusion, similar to the prior CT and without coalescence. Vascular: Normal non-contrast appearance of the carotid canals, jugular bulbs and sigmoid plates. Limited intracranial: More fully evaluated on the contemporaneous dedicated head CT. Visible orbits/paranasal sinuses: Minimal mucosal thickening in the paranasal sinuses. Unremarkable orbits. Soft tissues: Unremarkable. IMPRESSION: 1. Improved aeration of the left middle ear cavity and unchanged left mastoid effusion. No bone erosion. 2. New small right mastoid effusion. Electronically Signed   By: Dasie Hamburg M.D.   On: 09/08/2024 17:23   CT ABDOMEN PELVIS WO CONTRAST Result Date: 09/08/2024 CLINICAL DATA:  Sepsis jaucince, cirrosh. EXAM: CT ABDOMEN AND PELVIS WITHOUT CONTRAST TECHNIQUE: Multidetector CT imaging of the abdomen and pelvis was performed following the standard protocol without IV contrast. RADIATION DOSE REDUCTION: This exam was performed according to the departmental dose-optimization program which includes automated exposure control, adjustment of the mA and/or kV according to patient size and/or use of iterative reconstruction technique. COMPARISON:  CT scan abdomen and  pelvis from 08/08/2024. FINDINGS: Lower chest: There is small left and small-to-moderate right pleural effusion with associated compressive atelectatic changes in the bilateral lower lobes. There are additional diffuse patchy ground-glass opacities in the middle lobe and bilateral lower lobes. Findings are nonspecific but new since the prior study. Differential  diagnosis includes multilobar pneumonia, pulmonary edema, ARDS, atypical pneumonia, hypersensitivity pneumonitis, etc. Normal heart size. No pericardial effusion. There is apparent hypoattenuation of the blood pool relative to the myocardium, suggestive of anemia. Hepatobiliary: The liver is normal in size. There is diffuse liver surface irregularity/nodularity, compatible with cirrhosis. No focal lesion seen within the limitations of this unenhanced exam. No intrahepatic or extrahepatic bile duct dilation. Distended gallbladder containing small volume dependent gallstones/sludge without imaging signs of acute cholecystitis. No abnormal wall thickening. Pancreas: Unremarkable. No pancreatic ductal dilatation or surrounding inflammatory changes. Spleen: Within normal limits. No focal lesion. Adrenals/Urinary Tract: Evaluation of bilateral adrenal glands is limited due to bilateral suprarenal venous collaterals, which are difficult to differentiate from the adrenal glands. No suspicious renal mass within the limitations of this unenhanced exam. No nephroureterolithiasis or obstructive uropathy. Urinary bladder is decompressed surrounding the Foley catheter. Stomach/Bowel: There are at least 2 enteric tubes. No disproportionate dilation of the small or large bowel loops. No evidence of abnormal bowel wall thickening or inflammatory changes. The appendix was not visualized; however there is no acute inflammatory process in the right lower quadrant. Vascular/Lymphatic: There is moderate ascites. No walled-off abscess. No pneumoperitoneum. There is also mild diffuse mesenteric edema. No abdominal or pelvic lymphadenopathy, by size criteria. No aneurysmal dilation of the major abdominal arteries. There are mild peripheral atherosclerotic vascular calcifications of the aorta and its major branches. Reproductive: The uterus is unremarkable. No large adnexal mass. Other: There is small left femoral hernia containing fat.  There is moderate anasarca. Right common iliac arterial and venous catheters noted inserted from right inguinal approach. Musculoskeletal: No suspicious osseous lesions. There are mild multilevel degenerative changes in the visualized spine. IMPRESSION: 1. No acute inflammatory process identified within the abdomen or pelvis. 2. There is cirrhotic liver configuration. There is moderate ascites. No walled-off abscess or pneumoperitoneum. 3. There is small left and small-to-moderate right pleural effusion with associated compressive atelectatic changes in the bilateral lower lobes. There are additional diffuse patchy ground-glass opacities in the middle lobe and bilateral lower lobes. Findings are nonspecific but new since the prior study. Differential diagnosis includes multilobar pneumonia, pulmonary edema, ARDS, atypical pneumonia, hypersensitivity pneumonitis, etc. 4. Multiple other nonacute observations, as described above. Aortic Atherosclerosis (ICD10-I70.0). Electronically Signed   By: Ree Molt M.D.   On: 09/08/2024 16:27   DG CHEST PORT 1 VIEW Result Date: 09/08/2024 CLINICAL DATA:  33510.  Adult respiratory distress syndrome. EXAM: PORTABLE CHEST 1 VIEW COMPARISON:  Portable chest yesterday at 4:31 a.m. FINDINGS: 4:48 a.m. NGT again noted entering the stomach with interval new feeding tube paralleling it. The radiopaque feeding tube and NGT tip and both are out of view. ETT terminates 4.2 cm from the carina. Right IJ double-lumen catheter with tip in the distal SVC. There is a tangle of overlying wires. The heart is slightly enlarged. There is still mild perihilar vascular congestion. Interstitial and patchy airspace infiltrates throughout the lungs continue to be seen with more dense left lower lobe consolidation and small pleural effusions. Overall aeration seems unchanged. The mediastinum stable. No new osseous finding. IMPRESSION: 1. No significant change in the appearance of the chest. 2.  Support apparatus as above.  3. Stable cardiomegaly and mild perihilar vascular congestion. 4. Stable interstitial and patchy airspace infiltrates throughout the lungs with more dense left lower lobe consolidation and small pleural effusions. Electronically Signed   By: Francis Quam M.D.   On: 09/08/2024 07:35      Assessment and Plan:   63 year old female with decompensated alcohol-induced cirrhosis admitted with severe sepsis and progressive multi-organ failure with PEA arrest on Sept 16.     Septic shock with DIC - Shock physiology improving, with decreasing pressor requirement, improving lactic acidosis - Previously on zosyn  3.375 g q8; changed to ceftriaxone /flagyl  today due to suspicion Zosyn  may have been worsening thrombocytopenia - GNRs in blood making cellulitis less likely cause of infection.  SBP less likely based on ascitic fluid analysis (although results may be influenced by prior antibiotics); no ID on GNR's yet - Management per PCCM - Stress dose hydrocortisone  100 mg IV q12   Decompensated cirrhosis - Severe liver dysfunction with underlying portal hypertension impairing her ability to fight infection and contributing to multiorgan failure, encephalopathy, coagulopathy and lactic acidosis - Patient at very risk for GI bleeding given cirrhosis and DIC with severe coagulopathy and thrombocytopenia, but no evidence of bleeding yet - Continue PPI BID, octreotide  discontinued - No role for prednisolone  for alcoholic hepatitis; patient had already been treated and had high Lille score prior to admission, prompting cessation of prednisolone  -Coagulopathy stable at 3   Acute renal failure, secondary to septic shock; likely HRS but cannot make this diagnosis in setting of septic shock - Nephrology  following, started CRRT 9/17 - Scheduled albumin  25 g q8hr  - Octreotide  100 mcg q8 hr   Encephalopathy, likely metabolic +/- hepatic +/- anoxic - EEG with no seizure activity -  Continue lactulose  30 g TID and rifaximin  550 mg BID for underlying HE - Concern for anoxic brain injury following PEA arrest   Respiratory failure, suspect ARDS - Low tidal volumes, vent management per PCCM - Extubation limited by mental status currently  Thrombocytopenia - Multifactorial (cirrhosis/splenic sequestration, DIC, medication) -  TTP not suspected per hematology  No active GI/hepatology issues currently which warrant changes in management GI will sign off at this time. Patient remains very high risk for GI bleed.  Please re-engage if patient demonstrates evidence of GI bleed.   Rachel FORBES Holt, MD  09/09/2024, 9:30 PM  Gastroenterology

## 2024-09-09 NOTE — Progress Notes (Signed)
 [facility] Progress Note  Name: Rachel Lucas MRN: [id number] DOB: Feb 08, 1961  Chief Complaint:  Leg swelling and chills  Subjective:  Seen and examined on CRRT.  Procedure supervised.  She had 2.4 liters UF over 9/20 with CRRT.  She has had a UF goal of net negative 50 ml/hr as tolerated.  She had 350 mL emesis/NG output charted.  She had 750 mL stool charted over 9/20.  She had 10 mL UOP over 9/20.   She has been on levo at 2 mcg/min and vasopressin  is at 0.04 units/min. Thrombocytopenia worse.  She had a CT head on 9/20 with no acute intracranial process.   Spoke with primary team.  I have called her husband this morning as well to update him.    Review of systems:  Unable to obtain 2/2 intubated and sedated   ----------------------- Background on consult:  Rachel Lucas is a 62 y.o. female with a history of hypothyroidism and recent diagnosis of alcoholic cirrhosis who presented to the hospital with chills and leg swelling.  She also left lower extremity erythema.  She was diagnosed with cellulitis.  She was also found to have otitis media with moderate effusion.  Unfortunately her course was complicated by a PEA arrest on 9/16.  She did achieve ROSC - per charting had 6-7 minutes of CPR.  She is currently intubated and on norepinephrine  9 mcg/min, epinephrine  at 6 mcg/min, and vasopressin  at 0.04 units/min.  Course has also been complicated by ARDS, DIC as well as AKI.  Nephrology is consulted for assistance with management of AKI.  Pulmonary critical care is concerned that she is nearing the need for CRRT.  She had 50 mL UOP over 9/16 charted.  She has been on a bicarb gtt at 125 ml/hr.  Note that IR was consulted for a possible paracentesis - they did not feel she was stable for same per their note.  She has been on vanc and zosyn  and pharmacy is consulted for dosing of the same.  Team has a right femoral central line and art line.  They had attempted a left internal  jugular central line earlier and this was not able to be placed.    I spoke with her husband at bedside and later her husband, her sister, brother-in-law, and niece in the family room in the ICU.  We discussed the risks/benefits/indications for CRRT.  At this time, they would like to talk as a family to decide how to proceed.    Intake/Output Summary (Last 24 hours) at 09/09/2024 0718 Last data filed at 09/09/2024 0700 Gross per 24 hour  Intake 1873.21 ml  Output 3531 ml  Net -1657.79 ml    Vitals:  Vitals:   09/09/24 0545 09/09/24 0600 09/09/24 0615 09/09/24 0630  BP:      Pulse: 75 83 82 75  Resp: 20 (!) 22 (!) 22 20  Temp: 99 F (37.2 C) 98.8 F (37.1 C) 98.8 F (37.1 C) 99 F (37.2 C)  TempSrc:      SpO2: 100% 100% 100% 100%  Weight:      Height:         Physical Exam:     General adult female critically ill and intubated HEENT normocephalic blood noted in and around her mouth; sclera are icteric Neck supple trachea midline Lungs coarse mechanical breath sounds; FIO2 40 and PEEP 8 Heart S1S2 no rub Abdomen softer and less distended; unable to assess tenderness 2/2 sedation Extremities 1-2+  edema bilateral lower extremities; much softer; left lower leg wrapped with gauze. Stigmata of chronic liver disease  Neuro no continuous sedation running GU foley catheter in place   Medications reviewed   Labs:     Latest Ref Rng & Units 09/09/2024    3:20 AM 09/08/2024    6:43 PM 09/08/2024    3:22 AM  BMP  Glucose 70 - 99 mg/dL 831  821    BUN 6 - 20 mg/dL 16  17    Creatinine 9.55 - 1.00 mg/dL 9.05  8.87    Sodium 864 - 145 mmol/L 139  140  138   Potassium 3.5 - 5.1 mmol/L 3.3  3.4  3.6   Chloride 98 - 111 mmol/L 105  103    CO2 22 - 32 mmol/L 23  23    Calcium  8.9 - 10.3 mg/dL 8.5  8.5       Assessment/Plan:    # AKI  - Secondary to ischemic ATN from her arrest.  Given that she is post-arrest and anuric and in shock, this is unfortunately expected to worsen.   Baseline Cr less than 1.  Renal US  with bilateral increased echogenicity.  Initiated on CRRT on 9/17 after nontunneled catheter with critical care for acidosis and shock with worsening AKI and volume status  ------------- - Continue CRRT.  Set dialysate at 1.2 liters/hr  - 4K fluids.  Potassium   - Continue UF goal at net negative 50 mL/hr as tolerated     # Septic shock  - Gram negative rod bacteremia from 9/15 cultures (not yet speciated) - Pressors per primary team   - Antibiotics per primary team and pharmacy is consulted for dosing - on zosyn    # Gram negative rod bacteremia - Gram negative rod bacteremia from 9/15 cultures (not yet speciated) - follow-up ID  # PEA arrest  - Supportive post-arrest care    # Hyponatremia  - Improved with CRRT    # Hypokalemia  - 4K fluids - replete potassium    # ARDS  - Vent per critical care  - Optimize volume status with CRRT    # Metabolic acidosis - She is on CRRT - s/p bicarb gtt - now off  - Treat shock and underlying etiology as above   # Cirrhosis - decompensated  - she is on albumin  and levophed    - IR performed bedside paracentesis on 9/18 with 1.3 liters     # Hypophosphatemia  - hyperphosphatemia improved with CRRT   - now with hypophosphatemia - give sodium phosphate     # Macrocytic anemia  - Concern for DIC - PRBC's per primary team threshold.  Note that she has a history of a prior transfusion reaction - has gotten without issue this week- will give PRBC's - With past heavy alcohol use there is concern for nutrient deficiency, as well  - Markedly elevated ferritin on check in August and iron sats not calculated.  Folate low at 3.2.  she is on folic acid  and thiamine  as well.  B12 ok    # Thrombocytopenia  - Note that she has had oozing from some IV sites per nursing - DIC per critical care team   - Worsening.  Not on heparin  with CRRT  - note that she is also on zosyn  which can cause thrombocytopenia - may need  to change to an alternate agent.  Discussed with primary team   Disposition - continue ICU monitoring.  Katheryn JAYSON Saba, MD 09/09/2024 8:05 AM

## 2024-09-09 NOTE — Consult Note (Signed)
 [facility] [phone]   [phone]  CONSULT NOTE  REFERRING PHYSICIAN: Dr. Dorethia Cave  REASON FOR CONSULTATION:  63 years old white female with persistent thrombocytopenia.  HPI Rachel Lucas is a 63 y.o. female.   Discussed the use of AI scribe software for clinical note transcription with the patient, who gave verbal consent to proceed.  The patient is a 63 year old with liver cirrhosis who presents with persistent thrombocytopenia and leg swelling. She is accompanied by her husband, [name]. I was asked to see her for evaluation of her declining platelets. She was seen by my partner Dr. Federico less than 2 weeks ago for questionable von Willebrand disease but this was negative.  She has persistent thrombocytopenia with platelet counts fluctuating between thirty thousand and close to a hundred thousand over several months. Despite a family history of von Willebrand disease, recent tests have ruled out this condition.  She initially presented with swelling in her left leg, diagnosed as cellulitis, which has been persistent. The leg swelling was the initial reason for her hospital visit.  Approximately one month ago, she experienced a significant blood clot in her ear following an ear infection and was evaluated by an ear, nose, and throat specialist.  She has a history of liver cirrhosis, contributing to her current medical issues, including thrombocytopenia and chronic anemia. There is no evidence of hemolysis as her reticulocyte count is normal and LDH levels are only slightly elevated. She had severe sepsis and currently on aggressive course of antibiotics.  Her DIC panel showed a low platelets in addition to low fibrinogen . Peripheral blood smear showed no schistocytes but several burr cells, acanthocytes. She is currently intubated but not sedated and is receiving renal dialysis.      Past Medical History:  Diagnosis Date    Hypothyroidism    Thyroid  disease    Von Willebrand disease (HCC)       Past Surgical History:  Procedure Laterality Date   CYST REMOVAL NECK  1995   IR PARACENTESIS  09/06/2024    Family History  Problem Relation Age of Onset   Asthma Mother    Eczema Mother    Heart Problems Father     Social History Social History   Tobacco Use   Smoking status: Former    Current packs/day: 0.00    Average packs/day: 0.2 packs/day for 10.0 years (2.0 ttl pk-yrs)    Types: Cigarettes    Start date: 12/21/1979    Quit date: 12/20/1989    Years since quitting: 34.7   Tobacco comments:    Pt also notes passive exposure- father smoked in house growing up.   Vaping Use   Vaping status: Never Used  Substance Use Topics   Alcohol use: Not Currently   Drug use: Never    Allergies  Allergen Reactions   Levofloxacin Other (See Comments)   Sulfonamide Derivatives Palpitations    Current Facility-Administered Medications  Medication Dose Route Frequency Provider Last Rate Last Admin   albumin  human 25 % solution 25 g  25 g Intravenous Q8H Ramaswamy, Murali, MD   Stopped at 09/09/24 1450   artificial tears (LACRILUBE) ophthalmic ointment   Both Eyes Q4H PRN Smith, Joshua C, NP       cefTRIAXone  (ROCEPHIN ) 2 g in sodium chloride  0.9 % 100 mL IVPB  2 g Intravenous Q24H Babcock, Peter E, NP   Stopped at 09/09/24 1346   Chlorhexidine  Gluconate Cloth 2 % PADS  6 each  6 each Topical Daily Claudene Toribio BROCKS, MD   6 each at 09/09/24 9050   diphenhydrAMINE  (BENADRYL ) injection 25 mg  25 mg Intravenous PRN Foster, Lori C, MD       feeding supplement (VITAL 1.5 CAL) liquid 1,000 mL  1,000 mL Per Tube Continuous Babcock, Peter E, NP 30 mL/hr at 09/09/24 1500 Infusion Verify at 09/09/24 1500   fentaNYL  (SUBLIMAZE ) injection 50-200 mcg  50-200 mcg Intravenous Q2H PRN Ramaswamy, Murali, MD   50 mcg at 09/09/24 0836   folic acid  (FOLVITE ) tablet 1 mg  1 mg Per Tube Daily Geronimo Amel, MD   1 mg at 09/09/24  0914   hydrocortisone  sodium succinate  (SOLU-CORTEF ) 100 MG injection 50 mg  50 mg Intravenous Q12H Babcock, Peter E, NP       Followed by   NOREEN ON 09/10/2024] hydrocortisone  sodium succinate  (SOLU-CORTEF ) 100 MG injection 25 mg  25 mg Intravenous Q12H Babcock, Peter E, NP       lactulose  (CHRONULAC ) 10 GM/15ML solution 30 g  30 g Per Tube TID Ramaswamy, Murali, MD   30 g at 09/09/24 0914   levothyroxine  (SYNTHROID ) tablet 88 mcg  88 mcg Per Tube QAC breakfast Geronimo Amel, MD   88 mcg at 09/09/24 0528   metroNIDAZOLE  (FLAGYL ) IVPB 500 mg  500 mg Intravenous Q12H Babcock, Peter E, NP   Stopped at 09/09/24 1221   midazolam  (VERSED ) injection 1-2 mg  1-2 mg Intravenous Q1H PRN Shitarev, Dimitry, MD   2 mg at 09/09/24 0841   norepinephrine  (LEVOPHED ) 16 mg in 250mL (0.064 mg/mL) premix infusion  0-40 mcg/min Intravenous Continuous Geronimo Amel, MD 0.94 mL/hr at 09/09/24 1500 1 mcg/min at 09/09/24 1500   ofloxacin  (OCUFLOX ) 0.3 % ophthalmic solution 5 drop  5 drop Both EARS Daily Geronimo Amel, MD   5 drop at 09/09/24 0948   ondansetron  (ZOFRAN ) injection 4 mg  4 mg Intravenous Q6H PRN Haze Led, MD   4 mg at 09/04/24 0406   Oral care mouth rinse  15 mL Mouth Rinse PRN Claudene Toribio BROCKS, MD       Oral care mouth rinse  15 mL Mouth Rinse Q2H Paliwal, Aditya, MD   15 mL at 09/09/24 1319   pantoprazole  (PROTONIX ) injection 40 mg  40 mg Intravenous Q12H Claudene Toribio BROCKS, MD   40 mg at 09/09/24 9074   pneumococcal 20-valent conjugate vaccine (PREVNAR 20) injection 0.5 mL  0.5 mL Intramuscular Prior to discharge Claudene Toribio BROCKS, MD       prismasol  BGK 4/2.5 infusion   CRRT Continuous Jerrye Katheryn BROCKS, MD 1,200 mL/hr at 09/09/24 1228 New Bag at 09/09/24 1228   prismasol  BGK 4/2.5 infusion   CRRT Continuous Jerrye Katheryn BROCKS, MD 400 mL/hr at 09/09/24 0354 New Bag at 09/09/24 0354   prismasol  BGK 4/2.5 infusion   CRRT Continuous Jerrye Katheryn BROCKS, MD 400 mL/hr at 09/09/24 0353 New Bag at 09/09/24 0353    rifaximin  (XIFAXAN ) tablet 550 mg  550 mg Per Tube BID Ramaswamy, Murali, MD   550 mg at 09/09/24 0914   sodium chloride  flush (NS) 0.9 % injection 10-40 mL  10-40 mL Intracatheter Q12H Claudene Toribio BROCKS, MD   10 mL at 09/08/24 2146   sodium chloride  flush (NS) 0.9 % injection 10-40 mL  10-40 mL Intracatheter PRN Claudene Toribio BROCKS, MD       thiamine  (VITAMIN B1) tablet 100 mg  100 mg Per Tube Daily Geronimo Amel, MD  100 mg at 09/09/24 9085   vasopressin  (PITRESSIN) 20 Units in 100 mL (0.2 unit/mL) infusion-*FOR SHOCK*  0.03 Units/min Intravenous Continuous Babcock, Peter E, NP 9 mL/hr at 09/09/24 1500 0.03 Units/min at 09/09/24 1500    Review of Systems  Review of systems not obtained due to patient factors.  Physical Exam  MJO:jozmu, healthy, no distress, malnourished, pale, uncooperative, and intubated SKIN: Icteric HEAD: Normocephalic EYES: normal EARS: Canals clear OROPHARYNX:no exudate and no erythema  NECK: supple, no adenopathy, no JVD LYMPH:  no palpable lymphadenopathy, no hepatosplenomegaly BREAST:not examined LUNGS: clear to auscultation , and palpation HEART: regular rate & rhythm, no murmurs, and no gallops ABDOMEN:abdomen soft, non-tender, normal bowel sounds, and no masses or organomegaly BACK: No CVA tenderness, Range of motion is normal EXTREMITIES:no joint deformities, effusion, or inflammation  NEURO: Intubated and uncooperative   LABORATORY DATA: Lab Results  Component Value Date   WBC 18.2 (H) 09/09/2024   HGB 7.2 (L) 09/09/2024   HCT 21.1 (L) 09/09/2024   MCV 101.4 (H) 09/09/2024   PLT 24 (LL) 09/09/2024    @LASTCHEM @  RADIOGRAPHIC STUDIES: US  Abdomen Limited RUQ (LIVER/GB) Result Date: 09/08/2024 CLINICAL DATA:  Septic shock, cirrhosis, ascites EXAM: ULTRASOUND ABDOMEN LIMITED RIGHT UPPER QUADRANT COMPARISON:  CT today FINDINGS: Gallbladder: Layering sludge within the gallbladder. No shadowing echogenic foci to suggest stones. No wall thickening  or sonographic Murphy sign. Common bile duct: Diameter: Normal caliber, 5 mm Liver: Nodular contours compatible with cirrhosis. No focal abnormality. Portal vein is patent with reversal of flow. Other: Ascites noted in all 4 quadrants. Bilateral pleural effusions noted. IMPRESSION: Changes of cirrhosis with associated ascites. Sludge within the gallbladder. No evidence of cholelithiasis or acute cholecystitis. Bilateral pleural effusions. Electronically Signed   By: Franky Crease M.D.   On: 09/08/2024 19:43   CT HEAD WO CONTRAST ( ) Result Date: 09/08/2024 CLINICAL DATA:  Anoxic brain damage. EXAM: CT HEAD WITHOUT CONTRAST TECHNIQUE: Contiguous axial images were obtained from the base of the skull through the vertex without intravenous contrast. RADIATION DOSE REDUCTION: This exam was performed according to the departmental dose-optimization program which includes automated exposure control, adjustment of the mA and/or kV according to patient size and/or use of iterative reconstruction technique. COMPARISON:  None Available. FINDINGS: Brain: There is no evidence of an acute infarct, intracranial hemorrhage, mass, midline shift, or extra-axial fluid collection. There is mild cerebral atrophy. Cerebral white matter hypodensities are nonspecific but compatible with mild chronic small vessel ischemic disease. No cerebral edema is evident. Vascular: Calcified atherosclerosis at the skull base. No hyperdense vessel. Skull: No acute fracture or suspicious lesion. Sinuses/Orbits: Minimal mucosal thickening in the included paranasal sinuses. Temporal bone reported separately. Unremarkable orbits. Other: None. IMPRESSION: 1. No evidence of acute intracranial abnormality. 2. Mild chronic small vessel ischemic disease and cerebral atrophy. Electronically Signed   By: Dasie Hamburg M.D.   On: 09/08/2024 17:25   CT TEMPORAL BONES WO CONTRAST Result Date: 09/08/2024 CLINICAL DATA:  Mastoiditis. EXAM: CT TEMPORAL BONES  WITHOUT CONTRAST TECHNIQUE: Axial and coronal plane CT imaging of the petrous temporal bones was performed with thin-collimation image reconstruction. No intravenous contrast was administered. Multiplanar CT image reconstructions were also generated. RADIATION DOSE REDUCTION: This exam was performed according to the departmental dose-optimization program which includes automated exposure control, adjustment of the mA and/or kV according to patient size and/or use of iterative reconstruction technique. COMPARISON:  CT temporal bones 09/03/2024 FINDINGS: RIGHT TEMPORAL BONE External auditory canal: Normal. Middle ear cavity: Normally  aerated. The scutum and ossicles are normal. The tegmen tympani is intact. Inner ear structures: The cochlea, vestibule and semicircular canals are normal. The vestibular aqueduct is not enlarged. Internal auditory and facial nerve canals:  Normal. Mastoid air cells: New mild scattered air cell opacification/small effusion without coalescence. LEFT TEMPORAL BONE External auditory canal: Persistent material throughout the [facility] with chart review documenting a tympanic membrane rupture and wick in place. No interval bone erosion. Middle ear cavity: There is a moderate amount of nonspecific material primarily throughout the mesotympanum with mild extension into the epitympanum, and middle ear cavity aeration has improved from the prior CT where it was completely opacified. Residual material surrounds the ossicles which appear grossly intact. The scutum and tegmen tympani also appear intact. Inner ear structures: The cochlea, vestibule and semicircular canals are normal. The vestibular aqueduct is not enlarged. Internal auditory and facial nerve canals:  Normal. Mastoid air cells: Moderate air cell opacification/effusion, similar to the prior CT and without coalescence. Vascular: Normal non-contrast appearance of the carotid canals, jugular bulbs and sigmoid plates. Limited intracranial: More  fully evaluated on the contemporaneous dedicated head CT. Visible orbits/paranasal sinuses: Minimal mucosal thickening in the paranasal sinuses. Unremarkable orbits. Soft tissues: Unremarkable. IMPRESSION: 1. Improved aeration of the left middle ear cavity and unchanged left mastoid effusion. No bone erosion. 2. New small right mastoid effusion. Electronically Signed   By: Dasie Hamburg M.D.   On: 09/08/2024 17:23   CT ABDOMEN PELVIS WO CONTRAST Result Date: 09/08/2024 CLINICAL DATA:  Sepsis jaucince, cirrosh. EXAM: CT ABDOMEN AND PELVIS WITHOUT CONTRAST TECHNIQUE: Multidetector CT imaging of the abdomen and pelvis was performed following the standard protocol without IV contrast. RADIATION DOSE REDUCTION: This exam was performed according to the departmental dose-optimization program which includes automated exposure control, adjustment of the mA and/or kV according to patient size and/or use of iterative reconstruction technique. COMPARISON:  CT scan abdomen and pelvis from 08/08/2024. FINDINGS: Lower chest: There is small left and small-to-moderate right pleural effusion with associated compressive atelectatic changes in the bilateral lower lobes. There are additional diffuse patchy ground-glass opacities in the middle lobe and bilateral lower lobes. Findings are nonspecific but new since the prior study. Differential diagnosis includes multilobar pneumonia, pulmonary edema, ARDS, atypical pneumonia, hypersensitivity pneumonitis, etc. Normal heart size. No pericardial effusion. There is apparent hypoattenuation of the blood pool relative to the myocardium, suggestive of anemia. Hepatobiliary: The liver is normal in size. There is diffuse liver surface irregularity/nodularity, compatible with cirrhosis. No focal lesion seen within the limitations of this unenhanced exam. No intrahepatic or extrahepatic bile duct dilation. Distended gallbladder containing small volume dependent gallstones/sludge without imaging  signs of acute cholecystitis. No abnormal wall thickening. Pancreas: Unremarkable. No pancreatic ductal dilatation or surrounding inflammatory changes. Spleen: Within normal limits. No focal lesion. Adrenals/Urinary Tract: Evaluation of bilateral adrenal glands is limited due to bilateral suprarenal venous collaterals, which are difficult to differentiate from the adrenal glands. No suspicious renal mass within the limitations of this unenhanced exam. No nephroureterolithiasis or obstructive uropathy. Urinary bladder is decompressed surrounding the Foley catheter. Stomach/Bowel: There are at least 2 enteric tubes. No disproportionate dilation of the small or large bowel loops. No evidence of abnormal bowel wall thickening or inflammatory changes. The appendix was not visualized; however there is no acute inflammatory process in the right lower quadrant. Vascular/Lymphatic: There is moderate ascites. No walled-off abscess. No pneumoperitoneum. There is also mild diffuse mesenteric edema. No abdominal or pelvic lymphadenopathy, by size criteria. No  aneurysmal dilation of the major abdominal arteries. There are mild peripheral atherosclerotic vascular calcifications of the aorta and its major branches. Reproductive: The uterus is unremarkable. No large adnexal mass. Other: There is small left femoral hernia containing fat. There is moderate anasarca. Right common iliac arterial and venous catheters noted inserted from right inguinal approach. Musculoskeletal: No suspicious osseous lesions. There are mild multilevel degenerative changes in the visualized spine. IMPRESSION: 1. No acute inflammatory process identified within the abdomen or pelvis. 2. There is cirrhotic liver configuration. There is moderate ascites. No walled-off abscess or pneumoperitoneum. 3. There is small left and small-to-moderate right pleural effusion with associated compressive atelectatic changes in the bilateral lower lobes. There are additional  diffuse patchy ground-glass opacities in the middle lobe and bilateral lower lobes. Findings are nonspecific but new since the prior study. Differential diagnosis includes multilobar pneumonia, pulmonary edema, ARDS, atypical pneumonia, hypersensitivity pneumonitis, etc. 4. Multiple other nonacute observations, as described above. Aortic Atherosclerosis (ICD10-I70.0). Electronically Signed   By: Ree Molt M.D.   On: 09/08/2024 16:27   DG CHEST PORT 1 VIEW Result Date: 09/08/2024 CLINICAL DATA:  [id number].  Adult respiratory distress syndrome. EXAM: PORTABLE CHEST 1 VIEW COMPARISON:  Portable chest yesterday at 4:31 a.m. FINDINGS: 4:48 a.m. NGT again noted entering the stomach with interval new feeding tube paralleling it. The radiopaque feeding tube and NGT tip and both are out of view. ETT terminates 4.2 cm from the carina. Right IJ double-lumen catheter with tip in the distal SVC. There is a tangle of overlying wires. The heart is slightly enlarged. There is still mild perihilar vascular congestion. Interstitial and patchy airspace infiltrates throughout the lungs continue to be seen with more dense left lower lobe consolidation and small pleural effusions. Overall aeration seems unchanged. The mediastinum stable. No new osseous finding. IMPRESSION: 1. No significant change in the appearance of the chest. 2. Support apparatus as above. 3. Stable cardiomegaly and mild perihilar vascular congestion. 4. Stable interstitial and patchy airspace infiltrates throughout the lungs with more dense left lower lobe consolidation and small pleural effusions. Electronically Signed   By: Francis Quam M.D.   On: 09/08/2024 07:35   DG Abd Portable 1V Result Date: 09/07/2024 CLINICAL DATA:  Feeding tube placement EXAM: DG ABD PORTABLE 1V COMPARISON:  None Available. FINDINGS: Enteric tube tip and side hole terminates in the stomach. Additional enteric tube possibly terminating at the level of ligament of Treitz. Right  femoral catheter tip at the level of the common iliac. Paucity of bowel gas. No radio-opaque calculi or other significant radiographic abnormality are seen. IMPRESSION: Enteric tubes in place. Paucity of bowel gas. Electronically Signed   By: Megan  Zare M.D.   On: 09/07/2024 14:25   DG Abd 1 View Result Date: 09/07/2024 EXAM: 1 VIEW XRAY OF THE ABDOMEN 09/07/2024 11:24:00 AM COMPARISON: 09/05/2024 CLINICAL HISTORY: Ileus FINDINGS: LINES, TUBES AND DEVICES: Enteric tube terminates in the stomach. Right femoral vascular catheter unchanged, tip in the region of the common iliac vessels . BOWEL: Generalized positive bowel gas. No gaseous bowel distention. No significant formed colon colonic stool. SOFT TISSUES: No opaque urinary calculi. BONES: No acute osseous abnormality. IMPRESSION: 1. Paucity of bowel gas. No gaseous bowel distention. Electronically signed by: Andrea Gasman MD 09/07/2024 01:12 PM EDT RP Workstation: [id number]   DG CHEST PORT 1 VIEW Result Date: 09/07/2024 EXAM: 1 VIEW XRAY OF THE CHEST 09/07/2024 05:04:00 AM COMPARISON: 09/06/2024 CLINICAL HISTORY: Encounter for ARDS (adult respiratory distress syndrome) FINDINGS: LINES, TUBES  AND DEVICES: Endotracheal tube in place with tip 6 cm above the carina. Enteric tube in place, courses below the diaphragm, beyond the field of view. Right IJ CVC in place with tip overlying the lower SVC. LUNGS AND PLEURA: Improved aeration of lung bases. Persistent diffuse airspace opacities. Small left pleural effusion. HEART AND MEDIASTINUM: Unchanged heart size and mediastinal contours. BONES AND SOFT TISSUES: No acute osseous abnormality. IMPRESSION: 1. Improved aeration of lung bases. Persistent diffuse airspace opacities. 2. Small left pleural effusion. Electronically signed by: Andrea Gasman MD 09/07/2024 01:10 PM EDT RP Workstation: [id number]   IR Paracentesis Result Date: 09/06/2024 INDICATION: 63 year old female with history of cirrhosis, new  abdominal distention with sepsis. Diagnostic and therapeutic paracentesis to rule out spontaneous bacterial peritonitis. EXAM: ULTRASOUND GUIDED DIAGNOSTIC AND THERAPEUTIC PARACENTESIS MEDICATIONS: 4 mL 1% lidocaine  COMPLICATIONS: None immediate. PROCEDURE: Informed written consent was obtained from the patient after a discussion of the risks, benefits and alternatives to treatment. A timeout was performed prior to the initiation of the procedure. Initial ultrasound scanning demonstrates a small amount of ascites within the right lower abdominal quadrant. The right lower abdomen was prepped and draped in the usual sterile fashion. 1% lidocaine  was used for local anesthesia. Following this, a 19 gauge, 7-cm, Cook centesis catheter was introduced. An ultrasound image was saved for documentation purposes. The paracentesis was performed. The catheter was removed and a dressing was applied. The patient tolerated the procedure well without immediate post procedural complication. FINDINGS: A total of approximately 1.3 liters of light amber/orange fluid was removed. Samples were sent to the laboratory as requested by the clinical team. IMPRESSION: Successful ultrasound-guided paracentesis yielding 1.3 liters of peritoneal fluid. Performed by: Kacie Matthews PA-C Electronically Signed   By: Cordella Banner   On: 09/06/2024 15:15   DG CHEST PORT 1 VIEW Result Date: 09/06/2024 CLINICAL DATA:  Endotracheally intubated, ARDS EXAM: PORTABLE CHEST 1 VIEW COMPARISON:  09/05/2024 FINDINGS: Endotracheal tube is 5 cm above the carina. NG tube is in the stomach. Right dialysis catheter is unchanged. Heart and mediastinal contours are within normal limits. Airspace disease improved since prior study. Probable small layering effusions. IMPRESSION: Diffuse bilateral airspace disease improving since prior study. Small layering effusions. Electronically Signed   By: Franky Crease M.D.   On: 09/06/2024 13:41   DG CHEST PORT 1  VIEW Result Date: 09/05/2024 CLINICAL DATA:  [id number] Encounter for central line placement [id number] EXAM: PORTABLE CHEST - 1 VIEW COMPARISON:  09/05/2024 7:28 a.m. FINDINGS: Similarly positioned endotracheal tube terminating in the mid trachea. Right IJ approach central venous catheter has been placed in the interim, terminating in the lower SVC. Overlying defibrillator pads again noted. Esophagogastric tube courses below the diaphragm with the distal tip not included in the field of view. Unchanged diffuse hazy airspace disease throughout both lungs. Likely layering bilateral pleural effusions are also present. Mild cardiomegaly. No pneumothorax. IMPRESSION: Interval placement of a right IJ approach central venous catheter, which terminates in the lower SVC. No pneumothorax. Otherwise, little significant interval change to the lungs. Electronically Signed   By: Rogelia Myers M.D.   On: 09/05/2024 16:32   DG CHEST PORT 1 VIEW Result Date: 09/05/2024 CLINICAL DATA:  63 year old female with respiratory failure. Intubated. EXAM: PORTABLE CHEST 1 VIEW COMPARISON:  Portable chest yesterday and earlier. FINDINGS: Portable AP semi upright view at 0728 hours. Endotracheal tube tip at the level the clavicles. Enteric tube courses to the abdomen, tip not included. Pacer/resuscitation pads remain in place.  Stable lung volumes and visible mediastinal contours with ongoing diffuse pulmonary opacity, veiling and confluent bibasilar opacity which obscures the diaphragm. Vague but confluent symmetric lung opacity elsewhere. Marked progression compared to portable chest on 09/03/2024, ventilation not significantly changed from yesterday. No pneumothorax. Paucity of bowel gas. Stable visualized osseous structures. IMPRESSION: 1. Stable lines and tubes. 2. Extensive bilateral Pneumonia versus Edema versus ARDS without significant change from yesterday. Probable bilateral pleural effusions. Electronically Signed   By: VEAR Hurst M.D.    On: 09/05/2024 07:38   DG Abd Portable 1V Result Date: 09/05/2024 CLINICAL DATA:  Check gastric catheter placement EXAM: PORTABLE ABDOMEN - 1 VIEW COMPARISON:  Film from the previous day. FINDINGS: Gastric catheter is been advanced deeper into the stomach. Free air seen. IMPRESSION: Gastric catheter within the stomach. Electronically Signed   By: Oneil Devonshire M.D.   On: 09/05/2024 01:28   DG Abd 1 View Result Date: 09/04/2024 CLINICAL DATA:  Check gastric catheter placement EXAM: ABDOMEN - 1 VIEW COMPARISON:  None Available. FINDINGS: Gastric catheter is noted within the stomach. The proximal side port is felt to lie within the distal esophagus. This should be advanced several cm deeper and stomach. IMPRESSION: Gastric catheter as described. This should be advanced deeper into the stomach. Electronically Signed   By: Oneil Devonshire M.D.   On: 09/04/2024 22:16   DG CHEST PORT 1 VIEW Result Date: 09/04/2024 EXAM: 1 VIEW XRAY OF THE CHEST 09/04/2024 05:35:18 PM COMPARISON: 09/04/2024 CLINICAL HISTORY: Endotracheally intubated; MD approved xrays at bedside. FINDINGS: LINES, TUBES AND DEVICES: Endotracheal tube in place with tip 4.3 cm above the carina. Enteric tube with tip and sidehole overlying the lower esophagus, recommend advancement. External cardiac pacing pads on lower chest. LUNGS AND PLEURA: Hazy airspace opacities throughout the lungs are increased, likely representing multifocal pneumonia versus pulmonary edema. Bilateral pleural effusions. HEART AND MEDIASTINUM: No acute abnormality of the cardiac and mediastinal silhouettes. BONES AND SOFT TISSUES: No acute osseous abnormality. IMPRESSION: 1. Hazy airspace opacities throughout the lungs, likely representing multifocal pneumonia versus pulmonary edema. 2. Bilateral pleural effusions. 3. Enteric tube with tip and sidehole overlying the lower esophagus, recommend advancement. Electronically signed by: Donnice Mania MD 09/04/2024 07:31 PM EDT RP  Workstation: [id number]   DG Abd 1 View Result Date: 09/04/2024 EXAM: 1 VIEW XRAY OF THE ABDOMEN 09/04/2024 05:35:18 PM COMPARISON: 09/04/2024 CLINICAL HISTORY: Endotracheally intubated; MD approved xrays at bedside. FINDINGS: LINES, TUBES AND DEVICES: Enteric tube in distal esophagus. Right femoral vascular catheter in place. BOWEL: Paucity of gas throughout the abdomen. Gaseous distension of the stomach similar to prior study. SOFT TISSUES: No opaque urinary calculi. BONES: No acute osseous abnormality. IMPRESSION: 1. Gaseous distension of the stomach, similar to prior study. 2. Enteric tube with tip and side hole overlying the lower esophagus. Electronically signed by: Donnice Mania MD 09/04/2024 07:28 PM EDT RP Workstation: [id number]   EEG adult Result Date: 09/04/2024 Shelton Arlin KIDD, MD     09/04/2024  5:32 PM Patient Name: EDINA WINNINGHAM MRN: [id number] Epilepsy Attending: Arlin KIDD Shelton Referring Physician/Provider: Toma Matas, MD Date: 09/04/2024 Duration: 22.19 mins Patient history: 63yo F with ams getting eeg to evaluate for seizure Level of alertness: Awake AEDs during EEG study: None Technical aspects: This EEG study was done with scalp electrodes positioned according to the 10-20 International system of electrode placement. Electrical activity was reviewed with band pass filter of 1-70Hz , sensitivity of 7 uV/mm, display speed of 43mm/sec with a 60Hz  notched filter  applied as appropriate. EEG data were recorded continuously and digitally stored.  Video monitoring was available and reviewed as appropriate. Description: EEG showed continuous generalized 3 to 6 Hz theta-delta slowing. Hyperventilation and photic stimulation were not performed.   ABNORMALITY - Continuous slow, generalized IMPRESSION: This study is suggestive of moderate diffuse encephalopathy. No seizures or epileptiform discharges were seen throughout the recording. Priyanka O Yadav   VAS US  LOWER EXTREMITY VENOUS  (DVT) Result Date: 09/04/2024  Lower Venous DVT Study Patient Name:  MEKAELA AZIZI  Date of Exam:   09/04/2024 Medical Rec #: [id number]           Accession #:    [id number] Date of Birth: 11-20-1961           Patient Gender: F Patient Age:   6 years Exam Location:  [facility] Procedure:      VAS US  LOWER EXTREMITY VENOUS (DVT) Referring Phys: TORIBIO SHARPS --------------------------------------------------------------------------------  Indications: Edema.  Limitations: Poor ultrasound/tissue interface and line. Comparison Study: No previous exams Performing Technologist: Jody Hill RVT, RDMS  Examination Guidelines: A complete evaluation includes B-mode imaging, spectral Doppler, color Doppler, and power Doppler as needed of all accessible portions of each vessel. Bilateral testing is considered an integral part of a complete examination. Limited examinations for reoccurring indications may be performed as noted. The reflux portion of the exam is performed with the patient in reverse Trendelenburg.  +-----+---------------+---------+-----------+----------+--------------+ RIGHTCompressibilityPhasicitySpontaneityPropertiesThrombus Aging +-----+---------------+---------+-----------+----------+--------------+ CFV                                               not visualized +-----+---------------+---------+-----------+----------+--------------+ CFV/SFJ not visualized due to central and arterial line placement.  +---------+---------------+---------+-----------+----------+--------------+ LEFT     CompressibilityPhasicitySpontaneityPropertiesThrombus Aging +---------+---------------+---------+-----------+----------+--------------+ CFV      Full           No       Yes                                 +---------+---------------+---------+-----------+----------+--------------+ SFJ      Full                                                         +---------+---------------+---------+-----------+----------+--------------+ FV Prox  Full           No       Yes                                 +---------+---------------+---------+-----------+----------+--------------+ FV Mid   Full           No       Yes                                 +---------+---------------+---------+-----------+----------+--------------+ FV DistalFull           No       Yes                                 +---------+---------------+---------+-----------+----------+--------------+  PFV      Full                                                        +---------+---------------+---------+-----------+----------+--------------+ POP      Full           No       Yes                                 +---------+---------------+---------+-----------+----------+--------------+ PTV      Full                                                        +---------+---------------+---------+-----------+----------+--------------+ PERO     Full                                                        +---------+---------------+---------+-----------+----------+--------------+ pulsatile doppler waveforms    Summary: LEFT: - There is no evidence of deep vein thrombosis in the lower extremity.  - No cystic structure found in the popliteal fossa. Diffuse subcutaneous edema throughout lower extremity.  *See table(s) above for measurements and observations. Electronically signed by Penne Colorado MD on 09/04/2024 at 4:30:43 PM.    Final    DG Abd Portable 1V Result Date: 09/04/2024 EXAM: 1 VIEW XRAY OF THE ABDOMEN 09/04/2024 02:26:00 PM COMPARISON: CT abdomen and pelvis dated 08/08/2024. CLINICAL HISTORY: Encounter for feeding tube placement. Post intubation and feeding tube. FINDINGS: LINES, TUBES AND DEVICES: Enteric tube tip projects over the stomach. Side hole projects over the lower esophagus. Right inferior femoral approach venous catheter tip projects over the right L5  level. BOWEL: Gas filled mildly dilated stomach and a few loops of right-sided bowel loops are present. SOFT TISSUES: No opaque urinary calculi. BONES: No acute osseous abnormality. IMPRESSION: 1. Enteric tube tip projects over the stomach, with side hole projecting over the lower esophagus. Recommend advancing. Electronically signed by: Manford Cummins MD 09/04/2024 03:12 PM EDT RP Workstation: [id number]   DG CHEST PORT 1 VIEW Result Date: 09/04/2024 CLINICAL DATA:  Status post intubation EXAM: PORTABLE CHEST 1 VIEW COMPARISON:  September 04, 2024 abdominal radiograph and September 03, 2024 chest radiograph FINDINGS: Interval placement of endotracheal tube with tip at the level of the trachea. Recommend retraction 2-3 cm. Interval placement of enteric tube with tip outside the field of view with side port near the distal esophagus, better seen on same day abdominal radiograph. Recommend advancement. Partially imaged linear radiodensity projecting over the left hemithorax and mediastinum, possibly representing overlying lead or support device external to the patient. Low lung volumes with multifocal bilateral airspace opacities, greatest at the right lung base, possibly related to a pulmonary edema and/or infection. Unchanged cardiomediastinal silhouette. No acute osseous findings. IMPRESSION: 1. Endotracheal tube with tip at the level of the carina. Recommend retraction 2-3 cm. 2. Enteric tube with tip better seen on prior same day abdominal radiograph but side port  in the distal esophagus. Recommend advancement. 3. Additional overlying radiodensities, favored to represent external leads or support devices. 4. Multifocal pulmonary opacities, greatest at the right lung base, possibly representing multifocal pneumonia with possible underlying pulmonary edema. These results will be called to the ordering clinician or representative by the Radiologist Assistant, and communication documented in the PACS or [employer]. Electronically Signed   By: Michaeline Blanch M.D.   On: 09/04/2024 14:52   US  Abdomen Limited RUQ (LIVER/GB) Result Date: 09/04/2024 EXAM: Right Upper Quadrant Abdominal Ultrasound TECHNIQUE: Real-time ultrasonography of the right upper quadrant of the abdomen was performed. COMPARISON: CT abdomen and pelvis dated 08/08/2024. CLINICAL HISTORY: Cirrhosis (HCC) FINDINGS: LIVER: The liver demonstrates cirrhotic morphology with nodular hepatic contour. Portal vein is patent. Reversal of portal venous flow. BILIARY SYSTEM: No evidence of wall thickening. Moderate volume layering sludge. No cholelithiasis. Common bile duct measures 5 mm. OTHER: Moderate volume ascites. IMPRESSION: 1. Cirrhotic morphology with sequela of portal hypertension including moderate volume ascites and reversal of portal venous flow. 2. Gallbladder contains moderate volume layering sludge. Electronically signed by: Manford Cummins MD 09/04/2024 02:49 PM EDT RP Workstation: [id number]   ECHOCARDIOGRAM COMPLETE Result Date: 09/04/2024    ECHOCARDIOGRAM REPORT   Patient Name:   ELYSA WOMAC Date of Exam: 09/04/2024 Medical Rec #:  [id number]          Height:       63.0 in Accession #:    [id number]         Weight:       140.4 lb Date of Birth:  [birth year]          BSA:          1.664 m Patient Age:    62 years           BP:           139/85 mmHg Patient Gender: F                  HR:           59 bpm. Exam Location:  Inpatient Procedure: 2D Echo, Color Doppler and Cardiac Doppler (Both Spectral and Color            Flow Doppler were utilized during procedure). Indications:    Cardiac Arrest i46.9  History:        Patient has no prior history of Echocardiogram examinations.  Sonographer:    Damien Senior RDCS Referring Phys: [phone] MURALI RAMASWAMY  Sonographer Comments: Zoll pads still in place post arrest, no parasternals IMPRESSIONS  1. Left ventricular ejection fraction, by estimation, is 35 to 40%. The left ventricle has moderately  decreased function. Paradoxical/abnormal septal motion after cardiac arrest possibly secondary to junctional escape. Left ventricular diastolic parameters are indeterminate.  2. Right ventricular systolic function is low normal. The right ventricular size is normal. There is normal pulmonary artery systolic pressure. The estimated right ventricular systolic pressure is 33.5 mmHg.  3. The mitral valve is grossly normal. Severe mitral valve regurgitation.  4. Tricuspid valve regurgitation is moderate to severe.  5. The aortic valve is grossly normal. Aortic valve regurgitation is not visualized.  6. The inferior vena cava is dilated in size with <50% respiratory variability, suggesting right atrial pressure of 15 mmHg. FINDINGS  Left Ventricle: Left ventricular ejection fraction, by estimation, is 35 to 40%. The left ventricle has moderately decreased function. The left ventricle demonstrates regional wall motion abnormalities. The left ventricular internal cavity size  was normal in size. Suboptimal image quality limits for assessment of left ventricular hypertrophy. Left ventricular diastolic parameters are indeterminate. Right Ventricle: The right ventricular size is normal. No increase in right ventricular wall thickness. Right ventricular systolic function is low normal. There is normal pulmonary artery systolic pressure. The tricuspid regurgitant velocity is 2.15 m/s,  and with an assumed right atrial pressure of 15 mmHg, the estimated right ventricular systolic pressure is 33.5 mmHg. Left Atrium: Left atrial size was normal in size. Right Atrium: Right atrial size was normal in size. Pericardium: There is no evidence of pericardial effusion. Mitral Valve: The mitral valve is grossly normal. Severe mitral valve regurgitation. Tricuspid Valve: The tricuspid valve is grossly normal. Tricuspid valve regurgitation is moderate to severe. Aortic Valve: The aortic valve is grossly normal. Aortic valve regurgitation is  not visualized. Pulmonic Valve: The pulmonic valve was not assessed. Pulmonic valve regurgitation is not visualized. Aorta: The ascending aorta was not well visualized. Venous: The inferior vena cava is dilated in size with less than 50% respiratory variability, suggesting right atrial pressure of 15 mmHg. IAS/Shunts: No atrial level shunt detected by color flow Doppler. Additional Comments: Severe ascites is present.  LEFT VENTRICLE PLAX 2D LVOT diam:     2.10 cm LV SV:         29 LV SV Index:   18 LVOT Area:     3.46 cm  LV Volumes (MOD) LV vol d, MOD A4C: 85.2 ml LV vol s, MOD A4C: 50.6 ml LV SV MOD A4C:     85.2 ml RIGHT VENTRICLE RV S prime:     11.60 cm/s TAPSE (M-mode): 2.0 cm LEFT ATRIUM             Index        RIGHT ATRIUM           Index LA Vol (A2C):   47.3 ml 28.43 ml/m  RA Area:     17.10 cm LA Vol (A4C):   42.0 ml 25.24 ml/m  RA Volume:   44.40 ml  26.69 ml/m LA Biplane Vol: 48.0 ml 28.85 ml/m  AORTIC VALVE LVOT Vmax:   57.90 cm/s LVOT Vmean:  43.350 cm/s LVOT VTI:    0.085 m TRICUSPID VALVE TR Peak grad:   18.5 mmHg TR Vmax:        215.00 cm/s  SHUNTS Systemic VTI:  0.08 m Systemic Diam: 2.10 cm Aditya Sabharwal Electronically signed by Ria Commander Signature Date/Time: 09/04/2024/2:27:44 PM    Final    DG Chest 1 View Result Date: 09/03/2024 CLINICAL DATA:  Septic shock EXAM: PORTABLE CHEST 1 VIEW COMPARISON:  Film from earlier in the same day. FINDINGS: Cardiac shadow is stable. The lungs are well aerated bilaterally. Mild basilar atelectasis is seen. No focal confluent infiltrate is noted. Small right effusion is again seen. No bony abnormality is noted. IMPRESSION: Mild basilar atelectasis and right effusion stable from the prior study. Electronically Signed   By: Oneil Devonshire M.D.   On: 09/03/2024 21:52   CT TEMPORAL BONES WO CONTRAST Result Date: 09/03/2024 EXAM: CT TEMPORAL BONES WITHOUT CONTRAST 09/03/2024 06:32:41 PM TECHNIQUE: CT of the temporal bones was performed without  the administration of intravenous contrast. Multiplanar reformatted images are provided for review. Automated exposure control, iterative reconstruction, and/or weight based adjustment of the mA/kV was utilized to reduce the radiation dose to as low as reasonably achievable. COMPARISON: None available. CLINICAL HISTORY: Hearing loss, mixed. FINDINGS: RIGHT TEMPORAL BONE: EXTERNAL AUDITORY CANAL:  Clear. No bony erosion. Scutum is intact. MIDDLE EAR CAVITY: Clear. Ossicular chain is intact. MASTOID AIR CELLS: Clear. INNER EAR: The cochlea and vestibule are unremarkable. Normal mineralization of the otic capsule. Normal semicircular canals. The vestibular aqueduct is not dilated. INTERNAL AUDITORY CANAL: Unremarkable. Normal bony canal of the facial nerve. LEFT TEMPORAL BONE: EXTERNAL AUDITORY CANAL: There is lobulated debris within the left external auditory canal possibly related to history of tympanic membrane perforation. The left tympanic membrane is intact. There is no definite soft tissue swelling or abnormal enhancement along the left external auditory canal. MIDDLE EAR CAVITY: There is complete opacification of the left middle ear. Abnormal soft tissue attenuation surrounds the left ossicles without evidence of erosive change. The left ossicles appear normal in morphology and position. The left tympanic membrane is obscured due to adjacent soft tissue. There is thickening and heterogeneity of the left tympanic membrane likely reflecting chronic changes. MASTOID AIR CELLS: Moderate left mastoid effusion. INNER EAR: The cochlea and vestibule are unremarkable. Normal mineralization of the otic capsule. Normal semicircular canals. The vestibular aqueduct is not dilated. INTERNAL AUDITORY CANAL: Unremarkable. Normal bony canal of the facial nerve. VASCULAR: Normal jugular bulbs. Normal carotid canals. BRAIN: Unremarkable. ORBITS: No acute abnormality. SINUSES: Clear. IMPRESSION: 1. Complete opacification of the  left middle ear concerning for otitis media. Underlying small mass cannot be excluded. No erosive change of the ossicles. 2. Moderate left mastoid effusion. 3. Lobulated debris within the left external auditory canal, possibly related to history of tympanic membrane perforation. 4. Thickening and heterogeneity of the left tympanic membrane, likely reflecting chronic inflammatory changes. 5. Unremarkable CT of the right temporal bone. Electronically signed by: Donnice Mania MD 09/03/2024 07:17 PM EDT RP Workstation: [id number]   CT TIBIA FIBULA LEFT WO CONTRAST Result Date: 09/03/2024 CLINICAL DATA:  Soft tissue swelling in the lower extremity, evaluate for underlying infection EXAM: CT OF THE LOWER LEFT EXTREMITY WITHOUT CONTRAST TECHNIQUE: Multidetector CT imaging of the lower left extremity was performed according to the standard protocol. RADIATION DOSE REDUCTION: This exam was performed according to the departmental dose-optimization program which includes automated exposure control, adjustment of the mA and/or kV according to patient size and/or use of iterative reconstruction technique. COMPARISON:  None Available. FINDINGS: Bones/Joint/Cartilage Distal femur is within normal limits. Tibia and fibula are well visualized without acute fracture or erosive changes. No joint effusion is noted in the knee. Ligaments Suboptimally assessed by CT. Muscles and Tendons Surrounding musculature reveals fatty infiltration of the medial head of the gastrocnemius. The Achilles tendon is intact. The remainder of the musculature appears within normal limits. Visualized tendons appear intact. Soft tissues Considerable soft tissue edema is noted throughout the lower extremity and extending into the distal thigh. No focal findings to suggest abscess are identified. IMPRESSION: No acute fracture or erosive changes are noted. Considerable subcutaneous edema without definitive abscess. Incidental note is made of fatty infiltration  of the medial head of the gastrocnemius. Electronically Signed   By: Oneil Devonshire M.D.   On: 09/03/2024 19:10   US  RENAL Result Date: 09/03/2024 CLINICAL DATA:  Acute kidney injury EXAM: RENAL / URINARY TRACT ULTRASOUND COMPLETE COMPARISON:  CT abdomen pelvis August 08, 2024 FINDINGS: Right Kidney: Renal measurements: 10.5 x 4.7 x 5.4 cm = volume: 137 mL. Echogenicity is increased. No mass or hydronephrosis visualized. Left Kidney: Renal measurements: 11.2 x 4.9 x 4.5 cm = volume: 132 cm mL. Echogenicity is increased. No mass or hydronephrosis visualized. Bladder: Not well seen due  to moderate ascites and under distension. Other: Moderate ascites is identified throughout the abdominopelvic cavity. Cirrhotic liver morphology. IMPRESSION: Bilateral increased renal echogenicity which can be seen the setting of parenchymal renal disease. (hepatocellular disease) Partially visualized cirrhotic liver. Moderate ascites. Electronically Signed   By: Megan  Zare M.D.   On: 09/03/2024 18:53   DG Chest Port 1 View Result Date: 09/03/2024 CLINICAL DATA:  [id number] Sepsis (HCC) [id number] EXAM: PORTABLE CHEST - 1 VIEW COMPARISON:  08/13/2024 FINDINGS: Low lung volumes. Patchy airspace opacities in both lung bases. No pneumothorax. Unchanged trace right pleural effusion. Biapical pleural scarring. No cardiomegaly. Tortuous aorta with aortic atherosclerosis. No acute fracture or destructive lesions. Multilevel thoracic osteophytosis. IMPRESSION: 1. Low lung volumes. Patchy airspace opacities in both lung bases, which may represent atelectasis or a developing bronchopneumonia, in the correct clinical context. 2. Unchanged trace right pleural effusion. Electronically Signed   By: Rogelia Myers M.D.   On: 09/03/2024 17:18   DG Chest Port 1 View Result Date: 08/13/2024 EXAM: 1 VIEW XRAY OF THE CHEST 08/13/2024 07:05:00 AM COMPARISON: 08/08/2024 CLINICAL HISTORY: [id number] SOB (shortness of breath) [id number]. Table formatting from the  original note was not included.; Reason for exam: ; SOB (shortness of breath SOB (shortness of breath) FINDINGS: LUNGS AND PLEURA: Mild increase in interstitial markings are noted bilaterally. No airspace consolidation or pneumothorax. Small bilateral pleural effusions noted, similar to the previous exam. HEART AND MEDIASTINUM: No acute abnormality of the cardiac and mediastinal silhouettes. BONES AND SOFT TISSUES: No acute osseous abnormality. IMPRESSION: 1. Small bilateral pleural effusions, similar to the previous exam. 2. Mild increase in interstitial markings bilaterally, which may reflect mild edema. 3. No airspace consolidation or pneumothorax. Electronically signed by: Waddell Calk MD 08/13/2024 07:24 AM EDT RP Workstation: [id number]    ASSESSMENT AND PLAN: Assessment and Plan Thrombocytopenia Chronic thrombocytopenia with fluctuating platelet counts, currently exacerbated by infection and antibiotic treatment.  This is likely secondary to sepsis and DIC.  No evidence of thrombotic thrombocytopenic purpura (TTP) based on peripheral blood smear and lack of schistocytes in addition to low reticulocyte count and mildly elevated LDH which usually very high in TTP and hemolysis. Awaiting ADAMTS 13 test results to further rule out TTP. Platelet counts have been as low as 30,000 and as high as 100,000 in the past. - Administer platelets as needed to maintain counts above 20,000. - Await ADAMTS 13 test results before considering plasmapheresis.   - If there is any change in the condition that makes TTP more likely, I do not see any contraindication to proceed with plasmapheresis.  Cellulitis of left lower limb with severe sepsis and DIC: Cellulitis of the left lower limb, contributing to sepsis. Currently being treated with antibiotics. The source of sepsis is under investigation by the critical care team. - Continue antibiotic therapy for cellulitis. - Monitor response to antibiotics and adjust  treatment as necessary.  Chronic anemia Chronic anemia without evidence of hemolysis. Normal reticulocyte count and slightly elevated LDH suggest non-hemolytic cause. Low fibrinogen  indicates infection as a contributing factor.  Liver cirrhosis Liver cirrhosis with associated complications including thrombocytopenia and encephalopathy, contributing to overall clinical picture and complicating management of other conditions.  Acute kidney injury requiring dialysis Acute kidney injury requiring dialysis, likely multifactorial including sepsis and underlying liver disease. Thank you for allowing me to participate in the care of Rachel Lucas.  Please call if you have any questions.    The patient's husband and family voice understanding of current disease status  and treatment options and is in agreement with the current care plan.  All questions were answered. The patient knows to call the clinic with any problems, questions or concerns. We can certainly see the patient much sooner if necessary.  Thank you so much for allowing me to participate in the care of Rachel Lucas. I will continue to follow up the patient with you and assist in her care.   Disclaimer: This note was dictated with voice recognition software. Similar sounding words can inadvertently be transcribed and may not be corrected upon review.   Sherrod MARLA Sherrod September 09, 2024, 3:12 PM

## 2024-09-10 ENCOUNTER — Inpatient Hospital Stay (HOSPITAL_COMMUNITY)

## 2024-09-10 ENCOUNTER — Other Ambulatory Visit: Payer: Self-pay

## 2024-09-10 DIAGNOSIS — A4150 Gram-negative sepsis, unspecified: Principal | ICD-10-CM

## 2024-09-10 DIAGNOSIS — N179 Acute kidney failure, unspecified: Secondary | ICD-10-CM | POA: Diagnosis not present

## 2024-09-10 DIAGNOSIS — A419 Sepsis, unspecified organism: Secondary | ICD-10-CM | POA: Diagnosis not present

## 2024-09-10 DIAGNOSIS — G9341 Metabolic encephalopathy: Secondary | ICD-10-CM | POA: Diagnosis not present

## 2024-09-10 DIAGNOSIS — J9601 Acute respiratory failure with hypoxia: Secondary | ICD-10-CM | POA: Diagnosis not present

## 2024-09-10 DIAGNOSIS — K7469 Other cirrhosis of liver: Secondary | ICD-10-CM | POA: Diagnosis not present

## 2024-09-10 DIAGNOSIS — R6521 Severe sepsis with septic shock: Secondary | ICD-10-CM | POA: Diagnosis not present

## 2024-09-10 LAB — POCT I-STAT 7, (LYTES, BLD GAS, ICA,H+H)
Acid-Base Excess: 2 mmol/L (ref 0.0–2.0)
Bicarbonate: 25.7 mmol/L (ref 20.0–28.0)
Calcium, Ion: 1.15 mmol/L (ref 1.15–1.40)
HCT: 24 % — ABNORMAL LOW (ref 36.0–46.0)
Hemoglobin: 8.2 g/dL — ABNORMAL LOW (ref 12.0–15.0)
O2 Saturation: 99 %
Patient temperature: 36.9
Potassium: 3.3 mmol/L — ABNORMAL LOW (ref 3.5–5.1)
Sodium: 144 mmol/L (ref 135–145)
TCO2: 27 mmol/L (ref 22–32)
pCO2 arterial: 37.5 mmHg (ref 32–48)
pH, Arterial: 7.444 (ref 7.35–7.45)
pO2, Arterial: 147 mmHg — ABNORMAL HIGH (ref 83–108)

## 2024-09-10 LAB — PREPARE PLATELET PHERESIS
Unit division: 0
Unit division: 0

## 2024-09-10 LAB — BPAM PLATELET PHERESIS
Blood Product Expiration Date: 202509232359
Blood Product Expiration Date: 202509232359
ISSUE DATE / TIME: 202509210916
ISSUE DATE / TIME: 202509211049
Unit Type and Rh: 6200
Unit Type and Rh: 7300

## 2024-09-10 LAB — COMPREHENSIVE METABOLIC PANEL WITH GFR
ALT: 50 U/L — ABNORMAL HIGH (ref 0–44)
AST: 92 U/L — ABNORMAL HIGH (ref 15–41)
Albumin: 5.2 g/dL — ABNORMAL HIGH (ref 3.5–5.0)
Alkaline Phosphatase: 92 U/L (ref 38–126)
Anion gap: 13 (ref 5–15)
BUN: 17 mg/dL (ref 6–20)
CO2: 23 mmol/L (ref 22–32)
Calcium: 9.1 mg/dL (ref 8.9–10.3)
Chloride: 105 mmol/L (ref 98–111)
Creatinine, Ser: 1 mg/dL (ref 0.44–1.00)
GFR, Estimated: >60 mL/min (ref >60)
Glucose, Bld: 183 mg/dL — ABNORMAL HIGH (ref 70–99)
Potassium: 3.3 mmol/L — ABNORMAL LOW (ref 3.5–5.1)
Sodium: 141 mmol/L (ref 135–145)
Total Bilirubin: 18.5 mg/dL (ref 0.0–1.2)
Total Protein: 7.2 g/dL (ref 6.5–8.1)

## 2024-09-10 LAB — PROTIME-INR
INR: 2.9 — ABNORMAL HIGH (ref 0.8–1.2)
Prothrombin Time: 32 s — ABNORMAL HIGH (ref 11.4–15.2)

## 2024-09-10 LAB — RENAL FUNCTION PANEL
Albumin: 4.9 g/dL (ref 3.5–5.0)
Anion gap: 13 (ref 5–15)
BUN: 17 mg/dL (ref 6–20)
CO2: 23 mmol/L (ref 22–32)
Calcium: 8.5 mg/dL — ABNORMAL LOW (ref 8.9–10.3)
Chloride: 103 mmol/L (ref 98–111)
Creatinine, Ser: 1.01 mg/dL — ABNORMAL HIGH (ref 0.44–1.00)
GFR, Estimated: >60 mL/min (ref >60)
Glucose, Bld: 167 mg/dL — ABNORMAL HIGH (ref 70–99)
Phosphorus: 3.3 mg/dL (ref 2.5–4.6)
Potassium: 3.3 mmol/L — ABNORMAL LOW (ref 3.5–5.1)
Sodium: 139 mmol/L (ref 135–145)

## 2024-09-10 LAB — BODY FLUID CULTURE W GRAM STAIN
Culture: NO GROWTH
Gram Stain: NONE SEEN

## 2024-09-10 LAB — CBC
HCT: 22.6 % — ABNORMAL LOW (ref 36.0–46.0)
Hemoglobin: 7.7 g/dL — ABNORMAL LOW (ref 12.0–15.0)
MCH: 34.4 pg — ABNORMAL HIGH (ref 26.0–34.0)
MCHC: 34.1 g/dL (ref 30.0–36.0)
MCV: 100.9 fL — ABNORMAL HIGH (ref 80.0–100.0)
Platelets: 45 K/uL — ABNORMAL LOW (ref 150–400)
RBC: 2.24 MIL/uL — ABNORMAL LOW (ref 3.87–5.11)
WBC: 22.5 K/uL — ABNORMAL HIGH (ref 4.0–10.5)
nRBC: 0.1 % (ref 0.0–0.2)

## 2024-09-10 LAB — GLUCOSE, CAPILLARY
Glucose-Capillary: 152 mg/dL — ABNORMAL HIGH (ref 70–99)
Glucose-Capillary: 157 mg/dL — ABNORMAL HIGH (ref 70–99)
Glucose-Capillary: 167 mg/dL — ABNORMAL HIGH (ref 70–99)
Glucose-Capillary: 179 mg/dL — ABNORMAL HIGH (ref 70–99)
Glucose-Capillary: 188 mg/dL — ABNORMAL HIGH (ref 70–99)
Glucose-Capillary: 197 mg/dL — ABNORMAL HIGH (ref 70–99)
Glucose-Capillary: 73 mg/dL (ref 70–99)

## 2024-09-10 LAB — HEMOGLOBIN A1C
Hgb A1c MFr Bld: 4.3 % — ABNORMAL LOW (ref 4.8–5.6)
Mean Plasma Glucose: 76.71 mg/dL

## 2024-09-10 LAB — CALCIUM, IONIZED
Calcium, Ionized, Serum: 4.6 mg/dL (ref 4.5–5.6)
Calcium, Ionized, Serum: 4.6 mg/dL (ref 4.5–5.6)

## 2024-09-10 LAB — MAGNESIUM: Magnesium: 2.7 mg/dL — ABNORMAL HIGH (ref 1.7–2.4)

## 2024-09-10 LAB — PHOSPHORUS: Phosphorus: 2 mg/dL — ABNORMAL LOW (ref 2.5–4.6)

## 2024-09-10 LAB — CYTOLOGY - NON PAP

## 2024-09-10 MED ORDER — INSULIN ASPART 100 UNIT/ML IJ SOLN
0.0000 [IU] | INTRAMUSCULAR | Status: DC
  Administered 2024-09-10 – 2024-09-11 (×9): 2 [IU] via SUBCUTANEOUS
  Administered 2024-09-11 – 2024-09-13 (×9): 1 [IU] via SUBCUTANEOUS
  Administered 2024-09-13: 2 [IU] via SUBCUTANEOUS
  Administered 2024-09-13: 1 [IU] via SUBCUTANEOUS
  Administered 2024-09-13: 2 [IU] via SUBCUTANEOUS
  Administered 2024-09-13: 1 [IU] via SUBCUTANEOUS
  Administered 2024-09-13: 2 [IU] via SUBCUTANEOUS
  Administered 2024-09-14 – 2024-09-15 (×6): 1 [IU] via SUBCUTANEOUS
  Administered 2024-09-16: 2 [IU] via SUBCUTANEOUS
  Administered 2024-09-16: 1 [IU] via SUBCUTANEOUS

## 2024-09-10 MED ORDER — FENTANYL CITRATE PF 50 MCG/ML IJ SOSY
50.0000 ug | PREFILLED_SYRINGE | INTRAMUSCULAR | Status: DC | PRN
  Administered 2024-09-10: 50 ug via INTRAVENOUS
  Filled 2024-09-10: qty 1

## 2024-09-10 MED ORDER — FENTANYL CITRATE PF 50 MCG/ML IJ SOSY: 50.0000 ug | PREFILLED_SYRINGE | INTRAMUSCULAR | Status: DC | PRN

## 2024-09-10 MED ORDER — POTASSIUM PHOSPHATES 15 MMOLE/5ML IV SOLN
30.0000 mmol | Freq: Once | INTRAVENOUS | Status: AC
  Administered 2024-09-10: 30 mmol via INTRAVENOUS
  Filled 2024-09-10: qty 10

## 2024-09-10 MED ORDER — VITAMIN K1 10 MG/ML IJ SOLN
10.0000 mg | Freq: Once | INTRAVENOUS | Status: AC
  Administered 2024-09-10: 10 mg via INTRAVENOUS
  Filled 2024-09-10: qty 1

## 2024-09-10 MED ORDER — VITAL 1.5 CAL PO LIQD
1000.0000 mL | ORAL | Status: DC
Start: 2024-09-10 — End: 2024-09-18
  Administered 2024-09-10 – 2024-09-18 (×7): 1000 mL
  Filled 2024-09-10 (×2): qty 1000

## 2024-09-10 MED ORDER — MELATONIN 5 MG PO TABS
5.0000 mg | ORAL_TABLET | Freq: Every evening | ORAL | Status: DC | PRN
  Administered 2024-09-10 – 2024-09-15 (×5): 5 mg via ORAL
  Filled 2024-09-10 (×5): qty 1

## 2024-09-10 MED ORDER — CHLORHEXIDINE GLUCONATE CLOTH 2 % EX PADS
6.0000 | MEDICATED_PAD | CUTANEOUS | Status: DC
  Administered 2024-09-10 – 2024-09-13 (×4): 6 via TOPICAL

## 2024-09-10 MED ORDER — RENA-VITE PO TABS
1.0000 | ORAL_TABLET | Freq: Every day | ORAL | Status: DC
  Administered 2024-09-10 – 2024-09-17 (×8): 1
  Filled 2024-09-10 (×8): qty 1

## 2024-09-10 MED ORDER — MIDAZOLAM HCL 2 MG/2ML IJ SOLN
1.0000 mg | INTRAMUSCULAR | Status: DC | PRN
  Administered 2024-09-11: 2 mg via INTRAVENOUS
  Filled 2024-09-10: qty 2

## 2024-09-10 MED ORDER — PROSOURCE TF20 ENFIT COMPATIBL EN LIQD
60.0000 mL | Freq: Two times a day (BID) | ENTERAL | Status: DC
  Administered 2024-09-10 – 2024-09-17 (×15): 60 mL
  Filled 2024-09-10 (×16): qty 60

## 2024-09-10 MED ORDER — FENTANYL CITRATE PF 50 MCG/ML IJ SOSY
50.0000 ug | PREFILLED_SYRINGE | INTRAMUSCULAR | Status: DC | PRN
  Administered 2024-09-11: 50 ug via INTRAVENOUS
  Administered 2024-09-11 (×3): 100 ug via INTRAVENOUS
  Administered 2024-09-12: 50 ug via INTRAVENOUS
  Filled 2024-09-10: qty 1
  Filled 2024-09-10: qty 2
  Filled 2024-09-10: qty 1
  Filled 2024-09-10 (×3): qty 2

## 2024-09-10 NOTE — Progress Notes (Signed)
 NAME:  Rachel Lucas, MRN:  [id number], DOB:  1961-07-23, LOS: 7 ADMISSION DATE:  09/03/2024, CONSULTATION DATE:  09/05/2024 REFERRING MD:  EDP, CHIEF COMPLAINT:  Left leg pain  History of Present Illness:  chip is a 63 yo female with new diagnosis of alcoholic cirrhosis with septic shock thought 2/2 LLE cellulitis, does have persistent otitis media from recent prior admission for Klebsiella pneumonia and bacteremia.    Patient had concern for single episode of melena the day prior to admission, but subsequently had normal stools.  Had no cough, congestion, or hematemesis.  Did endorse worsening LLE swelling and redness with some pain the day of admission.   Recently, patient was admitted for Klebciella bacteremia and pneumonia in setting of accidental ear canal perforation with Q-tip (8/20-8/27).   In the ED, patient was unresponsive to fluids with BP 60/30s and PCCM was consulted for admission.  Patient stated that her baseline SBP is in the 90s.   Pertinent  Medical History  Fam hx of VWD but test 8/25 neg Alcoholic cirrhosis recent new diagnosis Sober for 1 month per self and husband   Significant Hospital Events: Including procedures, antibiotic start and stop dates in addition to other pertinent events   9/15 - Admitted, working dx LE cellulitis and SBP in setting of nex dx of ETOH related cirrhosis.  started Zosyn  (9/15-) and linezolid  (9/15-), GI consulted with no concern for GIB, CVC and Art line placed in PM 9/16 - Worsening delirium, PEA arrest for 6 minutes with ROSC, NG tube in place 9/17 - EEG neg for seizure. ECHO: EF 35-40% mod dec'd LVF, pardoxical septal motion, mod to svr MR and TR. IVC w/ < 50% variability. Became hyperglycemic after switch to D10 fluids. Had minimal urine output at 50 mL. Nephro consulted for CRRT. HD cath placed.  CRRT started. IR consulted. Felt not stable for para. Stating: coagulopathy, shock and hypothermia as factors. 9/18 - Lactic acid  improving. Paracentesis, negative for SBP.  Vascular surgery consulted for cold LLE, no interventions indicated, no evidence of limb ischemia 9/19 - ID consulted, added inflammatory marker eval raising concern for vasculitis. Pressors down significantly after starting the epinephrine . Cortrak placed. For intolerance of TF, inflammatory eval: sed rate 4, lactate 6.6->4.6. tbili 17 (rising from 11.9). wbc still elevated. Hgb 7.5 to 7. Plts 49->43 9/20 - hemodynamics a little better. Still w/ sig WBC ct. Prob steroids but repeating abd US  and CT imaging of head to r/o mastoiditis. While there getting CT head for neuro prognostics 9/21 - All CT imaging including CT abdomen pelvis as well as CT temporal bone all reassuring no new evidence of infection.  Ultrasound abdomen negative.  Platelets down again, sending additional eval to rule out TTP.  Getting more platelets transfused.  Change abx to CTX and metronidazole . 9/22 - Off vasopressin , minimal sedation but remains encephalopathic.   Interim History / Subjective:  Patient continues to wean from pressors, but remains intubated on minimal vent settings.  She is unable to consistently follow instructions this AM, remains encephalopathic on minimal sedation.   Objective    Blood pressure (!) 123/54, pulse 74, temperature 98.2 F (36.8 C), resp. rate 20, height 5' 3 (1.6 m), weight 54.5 kg, SpO2 99%. CVP:  [0 mmHg-44 mmHg] 4 mmHg  Vent Mode: PRVC FiO2 (%):  [40 %] 40 % Set Rate:  [20 bmp] 20 bmp Vt Set:  [420 mL] 420 mL PEEP:  [8 cmH20] 8 cmH20 Plateau Pressure:  [85  cmH20-19 cmH20] 19 cmH20   Intake/Output Summary (Last 24 hours) at 09/10/2024 0735 Last data filed at 09/10/2024 0700 Gross per 24 hour  Intake 3579.43 ml  Output 6443 ml  Net -2863.57 ml   Filed Weights   09/08/24 0500 09/09/24 0500 09/10/24 0500  Weight: 61.9 kg 61 kg 54.5 kg    Examination: General: Intubated on ventilator.  Somnolent, resting in bed in NAD. HEENT: Dry,  cracked lips with small amount of crusted blood but no active bleeding.  Edematous eyes with scleral icterus.  ET tube in place. Cardiovascular: Regular rate and rhythm. No murmurs, rubs, or gallops appreciated. Pulmonary: Clear bilaterally to auscultation bilaterally with referred upper airway sounds, no crackles or wheezes. On ventilator, PRVC (40% FiO2, 8 PEEP). Abdominal: Mildly distended.  Audible bowel sounds. GU: Foley catheter in place. Skin: Jaundiced, fragile skin with scattered ecchymoses. Extremities: LLE with significant mottling and violaceous discoloration, wrapped and with some weeping noted through dressings.  Capillary refill 2-3 seconds. Neuro: Responds to loud stimuli, not able to follow instructions, not responding to questions.  Spontaneously moves all extremities intermittently.    Resolved problem list  ARDS   Assessment and Plan   Septic shock w/ lactic acidosis  2/2 GNR bacteremia: working Dx cellulitis, further complicated by acute systolic heart failure Lactate slowly clearing, likely slow 2/2 hepatic function.  WBC trending up.  Cultures with GNRs, but not speciated and reincubated for better growth, planning for 7-10 days of abx but defer to ID.  Cardiomyopathy likely from sepsis though possible contribution from EtOH. Plan: - Titrate norepinephrine  to goal MAP >65 - Discontinue vasopressin  today 9/22 - Stress dose steroids decreased to Q12 on 9/21; taper and DC after 9/23 - Cultures from 9/15 reincubated for better growth, CTM - Metronidazole  (9/21>), CTX (9/21>); continue pending ID recs - Zosyn  (9/15-9/21), discontinued after 7 days - No GDMT given shock state - Discontinue albumin    Acute hypoxic respiratory failure 2/2 acute lung injury Remains on ventilator and not currently following instructions consistently for SBT.  CXR this AM showing significant interval improvement in interstitial fluid and opacities though with persistent L retrocardiac  opacity. 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 Plan: - Continue supportive care, reorientation - RASS goal 0 to -1 - As needed fentanyl  - Continue rifaximin  and lactulose  30 mg TID   AKI 2/2 ischemic ATN post arrest Hypokalemia and hypophosphatemia, intermittent Continue CRRT per Nephro.  Now net +5L this admission and continuing to improve fluid balance with -2.8L in past 24 hours, primarily via CRRT.  Remains anuric. Plan: - Keep foley for now  - Daily CMP - Electrolyte management per Nephro - Adjust meds for CRRT and renal dosing - Strict I&Os   Decompensated alcoholic cirrhosis with ascites, Hx gastric varices, hepatic encephalopathy and  shock liver Transaminasees and bili stable.  Not a candidate for transplant.  Had sludge on US  on 16th) f/u abd US  still w/ sludge but no evidence of stones or cystitis. Plan: - Trending INR; send hepatic panel in AM - Rifaximine and lactulose  as above - Holding home propranolol  and spiro in setting  of sepsis   Critical illness related protein calorie malnutrition. Liquid stools Appreciate RD consult, post-pyloric Cortrack placed 9/20. Plan: - Advance trickle feeds via Cortrak as able - Stopped stool softeners as she is on lactulose  and think gut edema affecting absorption   Chronic macrocytic anemia Status post 1 unit PRBC 9/16; and got another unit of blood 9/20. Plan: - Holding AC given coagulopathy - F/u AM CBC - Transfusion threshold Hgb <7   Acute on chronic Thrombocytopenia 2/2 sepsis superimposed on cirrhosis Has schistocytes on DIC panel.  Prob reflects on-going DIC but will make sure not missing TTP Received platelets 9/17, again prior to paracentesis on 9/18, linezolid  stopped 9/18, again received platelets 9/21.  Also not a candidate for [facility] given coagulopathy.  ADAMTS13 and haptoglobin pending.  Retics 1.9%, suspect some transient myelosuppression in setting of critical illness. Plan: - Heme following, appreciate recs - Plt transfusion threshold <20 per Heme - If needs procedures, threshold Plts >50 - RLE SCDs - f/u ADAMTS13, haptoglobin    Coagulopathy (septic coagulopathy/DIC superimposed on  decompensated cirrhosis).  Has elevated factor VIII INR remains in 3 range. Pla: - Trend INR   Hypothyroidism Plan: - Continue levothyroxine  88 mcg   Glycemic control Was hypoglycemic.  Now on steroids, mildly hyperglycemia.  A1c 4.3. Plan: - Trend CBGs Q4h - Q4h low dose SSI started overnight, goal 140-180   cellulitis of LLE w diffuse ecchymosis Neg for DVT on 9/17, felt exacerbated by hydrostatic pressure and volume overload. Evaluated by vascular on 9/18 and 9/19. No concern for limb ischemia. Seen by Infectious disease on 9/19; they raised concern about vasculitis vs GI source. 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 Tobie with [facility] 9/16, who related that he has a no suspicion for mastoiditis after reviewing CT imaging.  Noted that patient has a packing wick in place and TM is open, so fluid would be expected but does not suspect mastoiditis based on radiography. Repeated imaging 9/20 improved aeration of middle ear cavity, left mastoid effusion no evidence of osteo.  Small right effusion. Plan: - Ciprodex  drops changed to ofloxacin  (family request and cleared w/ ENT) both ears (started 9/20) - Monitor postauricular area for proptosis   Labs   CBC: Recent  Labs  Lab 09/03/24 1320 09/04/24 0415 09/07/24 0500 09/08/24 0316 09/08/24 0322 09/09/24 0320 09/09/24 0941 09/10/24 0408  WBC 3.3*   < > 20.4* 22.6*  --  20.2* 18.2* 22.5*  NEUTROABS 2.2  --   --   --   --   --  16.2*  --   HGB 9.8*   < > 7.2*  7.5* 7.0* 6.8* 7.6* 7.2* 7.7*  HCT 30.7*   < > 20.7*  22.0* 20.6* 20.0* 22.2* 21.1* 22.6*  MCV 120.4*   < > 106.2* 107.3*  --  101.4* 101.4* 100.9*  PLT 93*   < > 52* 43*  --  19*  20* 24* 45*   < > = values in this interval not displayed.    Basic Metabolic Panel: Recent Labs  Lab 09/06/24 0445 09/06/24 1034 09/07/24 0500 09/07/24 1545 09/08/24 0316 09/08/24 0322 09/08/24 1843 09/09/24 0320 09/09/24 1801 09/10/24 0408  NA 130*  131*   < > 135  135   < > 138 138 140 139 140 141  K 3.7  3.7   < > 3.9  3.9   < > 3.6 3.6 3.4* 3.3* 3.1* 3.3*  CL 88*  89*   < > 99   < > 100  --  103 105 102 105  CO2 24  22   < > 20*   < > 21*  --  23 23 23 23   GLUCOSE 112*  113*   < > 85   < > 137*  --  178* 168* 184* 183*  BUN 19  19   < > 14   < > 14  --  17 16 16 17   CREATININE 2.01*  2.20*   < > 1.51*   < > 1.12*  --  1.12* 0.94 1.00 1.00  CALCIUM  7.6*  7.6*   < > 8.3*   < > 8.6*  --  8.5* 8.5* 8.6* 9.1  MG 2.5*  --  2.5*  --  2.7*  --   --  2.6*  --  2.7*  PHOS 4.5  4.5   < > 3.2  3.2   < > 1.8*  1.8*  --  2.9 2.1* 3.2 2.0*   < > = values in this interval not displayed.   GFR: Estimated Creatinine Clearance: 49.5 mL/min (by C-G formula based on SCr of 1 mg/dL). Recent Labs  Lab 09/03/24 2130 09/04/24 0039 09/06/24 1430 09/07/24 0500 09/08/24 0316 09/09/24 0320 09/09/24 0941 09/10/24 0408  PROCALCITON 30.61  --   --   --   --   --   --   --   WBC  --    < >  --  20.4* 22.6* 20.2* 18.2* 22.5*  LATICACIDVEN 6.5*   < > 6.8* 6.6* 4.6* 2.4*  --   --    < > = values in this interval not displayed.    Liver Function Tests: Recent Labs  Lab 09/05/24 0320 09/06/24 0445 09/06/24 1649 09/08/24 0316 09/08/24 1843  09/09/24 0320 09/09/24 0941 09/09/24 1801 09/10/24 0408  AST 100* 82*  --  86*  --   --  88*  --  92*  ALT 38 38  --  39  --   --  43  --  50*  ALKPHOS 30* 36*  --  63  --   --  71  --  92  BILITOT 9.8* 11.9*  --  17.0*  --   --  18.4*  --  18.5*  PROT 5.6* 5.5*  --  6.3*  --   --  6.2*  --  7.2  ALBUMIN  3.5 3.6  3.6   < > 4.5  4.6 4.6 4.6 4.6 4.8 5.2*   < > = values in this interval not displayed.   Recent Labs  Lab 09/03/24 1320 09/05/24 1738 09/06/24 0445  LIPASE 37 37 42  AMYLASE  --  41  --    Recent Labs  Lab 09/03/24 1320 09/06/24 0911  AMMONIA 33 45*    ABG    Component Value Date/Time   PHART 7.493 (H) 09/08/2024 0322   PCO2ART 31.0 (L) 09/08/2024 0322   PO2ART 142 (H) 09/08/2024 0322   HCO3 23.9 09/08/2024 0322   TCO2 25 09/08/2024 0322   ACIDBASEDEF 1.0 09/07/2024 0500   O2SAT 89.7 09/09/2024 0319     Coagulation Profile: Recent Labs  Lab 09/06/24 0911 09/07/24 0500 09/08/24 0316 09/09/24 0320 09/10/24 0408  INR 3.2* 3.2* 3.2* 3.1* 2.9*    Cardiac Enzymes: Recent Labs  Lab 09/04/24 1836 09/05/24 0320 09/06/24 1141  CKTOTAL 318* 476* 408*    HbA1C: Hgb A1c MFr Bld  Date/Time Value Ref Range Status  09/10/2024 04:08 AM 4.3 (L) 4.8 - 5.6 % Final    Comment:    (NOTE) Diagnosis of Diabetes The following HbA1c ranges recommended by the American Diabetes Association (ADA) may be used as an aid in the diagnosis of diabetes mellitus.  Hemoglobin             Suggested A1C NGSP%              Diagnosis  <5.7                   Non Diabetic  5.7-6.4                Pre-Diabetic  >6.4                   Diabetic  <7.0                   Glycemic control for                       adults with diabetes.      CBG: Recent Labs  Lab 09/09/24 1531 09/09/24 2009 09/10/24 0043 09/10/24 0417 09/10/24 0722  GLUCAP 164* 162* 197* 179* 167*    Past Medical History:  She,  has a past medical history of Hypothyroidism, Thyroid  disease,  and Von Willebrand disease (HCC).   Surgical History:   Past Surgical History:  Procedure Laterality Date   CYST REMOVAL NECK  1995   IR PARACENTESIS  09/06/2024     Social History:   reports that she quit smoking about 34 years ago. Her smoking use included cigarettes. She started smoking about 44 years ago. She has a 2 pack-year smoking history. She does not have any smokeless tobacco history on file. She reports that she does not currently use alcohol. She reports that she does not use drugs.   Family History:  Her family history includes Asthma in her mother; Eczema in her mother; Heart Problems in her father.   Allergies Allergies  Allergen Reactions   Levofloxacin Other (See Comments)   Sulfonamide Derivatives Palpitations     Home Medications  Prior to Admission medications   Medication Sig Start Date End Date Taking? Authorizing Provider  folic acid  (FOLVITE ) 1 MG tablet Take 1 tablet (1 mg total) by mouth daily. 08/16/24  Yes Dennise Lavada POUR, MD  furosemide  (LASIX ) 20 MG tablet Take 1 tablet (20 mg total) by mouth daily. 08/30/24  Yes Craig Palma R, PA-C  lactulose , encephalopathy, (ENULOSE ) 10 GM/15ML SOLN Take 15 mLs (10 g total) by mouth 2 (two) times daily. 08/21/24  Yes Federico Rosario BROCKS, MD  levothyroxine  (SYNTHROID ) 88 MCG tablet Take 1 tablet (88 mcg total) by mouth daily before breakfast. 08/15/24  Yes Singh, Prashant K, MD  midodrine  (PROAMATINE ) 5 MG tablet Take 1 tablet (5 mg total) by mouth 2 (two) times daily with a meal. 08/30/24  Yes Craig Palma SAUNDERS, PA-C  propranolol  (INDERAL ) 10 MG tablet Take 1 tablet (10 mg total) by mouth 2 (two) times daily. 08/30/24  Yes Craig Palma SAUNDERS, PA-C  spironolactone  (ALDACTONE ) 50 MG tablet Take 1 tablet (50 mg total) by mouth daily. 08/30/24  Yes Craig Palma R, PA-C  thiamine  (VITAMIN B1) 100 MG tablet Take 1 tablet (100 mg total) by mouth daily. 08/16/24  Yes Singh, Prashant K, MD  potassium chloride  SA (KLOR-CON  M) 20 MEQ  tablet Take 1 tablet (20 mEq total) by mouth daily for 2 days. 08/21/24 08/31/24  Federico Rosario BROCKS, MD     Critical care time: 30 mins     Devany Aja Toma, MD PGY-2, ICU resident 09/10/24, 7:35 AM

## 2024-09-10 NOTE — Progress Notes (Signed)
 Admit: 09/03/2024 LOS: 7  47F anuric AKI cardiac arrest, septic shock, GNR bacteremia, decompensated cirrhosis, ARDS  Current CRRT Prescription: Start Date: 9/17 Catheter: Nontunneled IJ BFR: 300 Pre Blood Pump: 300 400 4K DFR: 1200 4K Replacement Rate: 400 4K Goal UF: -50 mL/h Anticoagulation: None Clotting: Infrequent  S: Seen on hemodialysis, family friend at bedside Overnight events discussed with primary RN Remains on low-dose norepinephrine  and vasopressin  A.m. CBC, CMP, coagulation studies reviewed Hematology and gastroenterology notes reviewed K3.3, phosphorus 2.0  O: 09/21 0701 - 09/22 0700 In: 3579.4 [I.V.:299.6; Blood:714.2; NG/GT:1178.8; IV Piggyback:1386.8] Out: 6443 [Urine:10; Stool:630]  Filed Weights   09/08/24 0500 09/09/24 0500 09/10/24 0500  Weight: 61.9 kg 61 kg 54.5 kg    Recent Labs  Lab 09/09/24 0320 09/09/24 1801 09/10/24 0408  NA 139 140 141  K 3.3* 3.1* 3.3*  CL 105 102 105  CO2 23 23 23   GLUCOSE 168* 184* 183*  BUN 16 16 17   CREATININE 0.94 1.00 1.00  CALCIUM  8.5* 8.6* 9.1  PHOS 2.1* 3.2 2.0*   Recent Labs  Lab 09/03/24 1320 09/04/24 0415 09/09/24 0320 09/09/24 0941 09/10/24 0408  WBC 3.3*   < > 20.2* 18.2* 22.5*  NEUTROABS 2.2  --   --  16.2*  --   HGB 9.8*   < > 7.6* 7.2* 7.7*  HCT 30.7*   < > 22.2* 21.1* 22.6*  MCV 120.4*   < > 101.4* 101.4* 100.9*  PLT 93*   < > 19*  20* 24* 45*   < > = values in this interval not displayed.    Scheduled Meds:  Chlorhexidine  Gluconate Cloth  6 each Topical Once per day on Monday Tuesday Wednesday Thursday Friday Saturday   folic acid   1 mg Per Tube Daily   hydrocortisone  sod succinate (SOLU-CORTEF ) inj  25 mg Intravenous Q12H   insulin  aspart  0-9 Units Subcutaneous Q4H   lactulose   30 g Per Tube TID   levothyroxine   88 mcg Per Tube QAC breakfast   ofloxacin   5 drop Both EARS Daily   mouth rinse  15 mL Mouth Rinse Q2H   pantoprazole  (PROTONIX ) IV  40 mg Intravenous Q12H    rifaximin   550 mg Per Tube BID   sodium chloride  flush  10-40 mL Intracatheter Q12H   thiamine   100 mg Per Tube Daily   Continuous Infusions:  albumin  human Stopped (09/10/24 0630)   cefTRIAXone  (ROCEPHIN )  IV Stopped (09/09/24 1346)   feeding supplement (VITAL 1.5 CAL) 30 mL/hr at 09/10/24 0800   metronidazole  Stopped (09/10/24 0137)   norepinephrine  (LEVOPHED ) Adult infusion 2 mcg/min (09/10/24 0800)   prismasol  BGK 4/2.5 1,200 mL/hr at 09/10/24 0455   prismasol  BGK 4/2.5 400 mL/hr at 09/10/24 0505   prismasol  BGK 4/2.5 400 mL/hr at 09/10/24 0504   vasopressin  0.02 Units/min (09/10/24 0800)   PRN Meds:.artificial tears, diphenhydrAMINE , fentaNYL  (SUBLIMAZE ) injection, midazolam , ondansetron  (ZOFRAN ) IV, mouth rinse, pneumococcal 20-valent conjugate vaccine, sodium chloride  flush  ABG    Component Value Date/Time   PHART 7.493 (H) 09/08/2024 0322   PCO2ART 31.0 (L) 09/08/2024 0322   PO2ART 142 (H) 09/08/2024 0322   HCO3 23.9 09/08/2024 0322   TCO2 25 09/08/2024 0322   ACIDBASEDEF 1.0 09/07/2024 0500   O2SAT 89.7 09/09/2024 0319   Chronically ill-appearing, jaundiced On ventilator, ET tube present Coarse breath sounds bilaterally Regular, normal S1 and S2 1-2+ lower extremity edema  A/P  Dialysis dependent anuric AKI from ATN on CRRT Cardiac arrest PEA 9/16  Septic shock with GNR bacteremia on vasopressors, per CCM AHRF/ARDS per CCM Decompensated alcoholic cirrhosis with portal hypertension, variceal disease and rising bilirubin Coagulopathy, thrombocytopenia, hematology following, DIC favored, TTP unlikely Hypophosphatemia related to CRRT Hypokalemia on 4K dialysate Anemia  Continue CRRT at current settings Replete phosphorus and potassium Poor prognosis   Bernardino Gasman, MD [facility]

## 2024-09-10 NOTE — Progress Notes (Addendum)
 eLink Physician-Brief Progress Note Patient Name: Rachel Lucas DOB: 19-Nov-1961 MRN: [id number]   Date of Service  09/10/2024  HPI/Events of Note  RN reports sedation gtts off, trying to wake pt.  Has prn fentanyl  ordered q2h for severe pain 7-10.  RN requesting to change the pain scale to the CPOT scale instead of numerical, since patient unable to communicate numbers.  eICU Interventions  Revised Fentanyl  to q 1 to target CPOT <3 Added melatonin 5 mg for sleep as per request        Damien ONEIDA Grout 09/10/2024, 8:58 PM

## 2024-09-10 NOTE — Progress Notes (Signed)
 [facility]  Date of Admission:  09/03/2024      Lines: 9/17-c right internal jugular hd catheter 9/15-c right femoral triple lumen 9/15-c right femoral arterial line  9/16-c urethral catheter  Other: 9/15 - c stress dose hydrocortisone   Abx: 9/21-c ceftriaxone  9/21-c metronidazole   9/15-21 piptazo 9/15-19 vanc 9/15-22 rifaximin   ASSESSMENT: 63 yo female newly dx'ed alcoholic cirrhosis (presuming all alcohol), recent admission 07/2024 for klebsiella bacteremia, readmitted 09/03/24 with septic shock, in setting of acute 1 day left leg swelling/redness and geographic bullae/erythematous macule  9/15 bcx gnr on anaerobic bottle only not able to result by bcid  9/22 the left leg had vascular surgery input and no acute vascular occlusion determinted. The rash had fairly looked the same. There are purpura on the arms. She remains with multiorgan failure (aki requiring crrt), septic shock on pressor, and with dic like pictures vs decompensated cirrhosis   Clinically she doesn't behave like sjs, which primary team had thought of. I am concerned this is all rather related to decompensated cirrhosis with hrs1. Renal is also following along   She has 7 days of abx already and we plan 10 days of abx treatment total pending further sensitivity testing for the gnr    Reviewed other data: Temporal bone imaging mastoid effusion without bony abnormality incidental and unclear significance. On abx and that would cover acute mastoiditis but I do not have reason to believe she has mastoiditis    PLAN: Continue ceftriaxone  flagyl  Supportive care per renal/pulm ccm Maintain standard isolation precaution Discussed with primary team    Principal Problem:   Septic shock [facility]) Active Problems:   Hepatorenal syndrome (HCC)   AKI (acute kidney injury)   Allergies  Allergen Reactions   Levofloxacin Other (See Comments)   Sulfonamide Derivatives  Palpitations    Scheduled Meds:  Chlorhexidine  Gluconate Cloth  6 each Topical Once per day on Monday Tuesday Wednesday Thursday Friday Saturday   feeding supplement (PROSource TF20)  60 mL Per Tube BID   folic acid   1 mg Per Tube Daily   hydrocortisone  sod succinate (SOLU-CORTEF ) inj  25 mg Intravenous Q12H   insulin  aspart  0-9 Units Subcutaneous Q4H   lactulose   30 g Per Tube TID   levothyroxine   88 mcg Per Tube QAC breakfast   multivitamin  1 tablet Per Tube QHS   ofloxacin   5 drop Both EARS Daily   mouth rinse  15 mL Mouth Rinse Q2H   pantoprazole  (PROTONIX ) IV  40 mg Intravenous Q12H   sodium chloride  flush  10-40 mL Intracatheter Q12H   thiamine   100 mg Per Tube Daily   Continuous Infusions:  cefTRIAXone  (ROCEPHIN )  IV Stopped (09/10/24 1437)   feeding supplement (VITAL 1.5 CAL) 40 mL/hr at 09/10/24 1600   metronidazole  Stopped (09/10/24 1121)   norepinephrine  (LEVOPHED ) Adult infusion 2 mcg/min (09/10/24 1600)   prismasol  BGK 4/2.5 1,200 mL/hr at 09/10/24 1323   prismasol  BGK 4/2.5 400 mL/hr at 09/10/24 0505   prismasol  BGK 4/2.5 400 mL/hr at 09/10/24 0504   PRN Meds:.artificial tears, diphenhydrAMINE , fentaNYL  (SUBLIMAZE ) injection, ondansetron  (ZOFRAN ) IV, mouth rinse, pneumococcal 20-valent conjugate vaccine, sodium chloride  flush   SUBJECTIVE: She remains on a touch of pressor 2 - 3 mcg/min if norepi Being tapered on stress dose hydrocortisone  On crrt Rash stable left leg  Review of Systems: ROS All other ROS was negative, except mentioned above     OBJECTIVE: Vitals:  09/10/24 1600 09/10/24 1601 09/10/24 1615 09/10/24 1630  BP:  137/62    Pulse: 84 94 82 90  Resp: 20 (!) 22 20 (!) 23  Temp:   98.1 F (36.7 C) 98.1 F (36.7 C)  TempSrc:      SpO2: 100% 100% 100% 100%  Weight:      Height:       Body mass index is 21.28 kg/m.  Physical Exam General/constitutional: ill apeparing, intubated, on pressor, sedated/comatose; fio2 30% HEENT:  jaundice; ett in place CV: tachy, no mrg Lungs: clear on vent Abd: Soft Ext: no LE edema; left leg wrapped and reviewed picture Skin: see pictures Neuro: sedated/intubated MSK: no peripheral joint swelling   Central line presence: lines site no purulence   Lab Results Lab Results  Component Value Date   WBC 22.5 (H) 09/10/2024   HGB 8.2 (L) 09/10/2024   HCT 24.0 (L) 09/10/2024   MCV 100.9 (H) 09/10/2024   PLT 45 (L) 09/10/2024    Lab Results  Component Value Date   CREATININE 1.01 (H) 09/10/2024   BUN 17 09/10/2024   NA 139 09/10/2024   K 3.3 (L) 09/10/2024   CL 103 09/10/2024   CO2 23 09/10/2024    Lab Results  Component Value Date   ALT 50 (H) 09/10/2024   AST 92 (H) 09/10/2024   ALKPHOS 92 09/10/2024   BILITOT 18.5 (HH) 09/10/2024      Microbiology: Recent Results (from the past 240 hours)  MRSA Next Gen by PCR, Nasal     Status: None   Collection Time: 09/03/24  4:45 PM   Specimen: Nasal Mucosa; Nasal Swab  Result Value Ref Range Status   MRSA by PCR Next Gen NOT DETECTED NOT DETECTED Final    Comment: (NOTE) The GeneXpert MRSA Assay (FDA approved for NASAL specimens only), is one component of a comprehensive MRSA colonization surveillance program. It is not intended to diagnose MRSA infection nor to guide or monitor treatment for MRSA infections. Test performance is not FDA approved in patients less than 59 years old. Performed at [facility], [address] [place], [place] [address]   Blood culture (routine x 2)     Status: None (Preliminary result)   Collection Time: 09/03/24  9:30 PM   Specimen: BLOOD  Result Value Ref Range Status   Specimen Description BLOOD SITE NOT SPECIFIED  Final   Special Requests   Final    BOTTLES DRAWN AEROBIC AND ANAEROBIC Blood Culture results may not be optimal due to an inadequate volume of blood received in culture bottles   Culture  Setup Time   Final    GRAM NEGATIVE RODS ANAEROBIC BOTTLE  ONLY CRITICAL VALUE NOTED.  VALUE IS CONSISTENT WITH PREVIOUSLY REPORTED AND CALLED VALUE.    Culture   Final    GRAM NEGATIVE RODS CULTURE REINCUBATED FOR BETTER GROWTH Performed at [facility], [address], [place], [place] [address]    Report Status PENDING  Incomplete  Blood culture (routine x 2)     Status: None (Preliminary result)   Collection Time: 09/03/24  9:30 PM   Specimen: BLOOD  Result Value Ref Range Status   Specimen Description BLOOD SITE NOT SPECIFIED  Final   Special Requests   Final    BOTTLES DRAWN AEROBIC AND ANAEROBIC Blood Culture results may not be optimal due to an inadequate volume of blood received in culture bottles   Culture  Setup Time   Final  GRAM NEGATIVE RODS ANAEROBIC BOTTLE ONLY CRITICAL RESULT CALLED TO, READ BACK BY AND VERIFIED WITH: PHARMD BLAKE WANNARAT ON 09/05/24 @ 1525 BY DRT    Culture   Final    GRAM NEGATIVE RODS CULTURE REINCUBATED FOR BETTER GROWTH Performed at [facility], [address], [place], [place] [address]    Report Status PENDING  Incomplete  Blood Culture ID Panel (Reflexed)     Status: None   Collection Time: 09/03/24  9:30 PM  Result Value Ref Range Status   Enterococcus faecalis NOT DETECTED NOT DETECTED Final   Enterococcus Faecium NOT DETECTED NOT DETECTED Final   Listeria monocytogenes NOT DETECTED NOT DETECTED Final   Staphylococcus species NOT DETECTED NOT DETECTED Final   Staphylococcus aureus (BCID) NOT DETECTED NOT DETECTED Final   Staphylococcus epidermidis NOT DETECTED NOT DETECTED Final   Staphylococcus lugdunensis NOT DETECTED NOT DETECTED Final   Streptococcus species NOT DETECTED NOT DETECTED Final   Streptococcus agalactiae NOT DETECTED NOT DETECTED Final   Streptococcus pneumoniae NOT DETECTED NOT DETECTED Final   Streptococcus pyogenes NOT DETECTED NOT DETECTED Final   A.calcoaceticus-baumannii NOT DETECTED NOT DETECTED Final   Bacteroides fragilis NOT DETECTED NOT DETECTED  Final   Enterobacterales NOT DETECTED NOT DETECTED Final   Enterobacter cloacae complex NOT DETECTED NOT DETECTED Final   Escherichia coli NOT DETECTED NOT DETECTED Final   Klebsiella aerogenes NOT DETECTED NOT DETECTED Final   Klebsiella oxytoca NOT DETECTED NOT DETECTED Final   Klebsiella pneumoniae NOT DETECTED NOT DETECTED Final   Proteus species NOT DETECTED NOT DETECTED Final   Salmonella species NOT DETECTED NOT DETECTED Final   Serratia marcescens NOT DETECTED NOT DETECTED Final   Haemophilus influenzae NOT DETECTED NOT DETECTED Final   Neisseria meningitidis NOT DETECTED NOT DETECTED Final   Pseudomonas aeruginosa NOT DETECTED NOT DETECTED Final   Stenotrophomonas maltophilia NOT DETECTED NOT DETECTED Final   Candida albicans NOT DETECTED NOT DETECTED Final   Candida auris NOT DETECTED NOT DETECTED Final   Candida glabrata NOT DETECTED NOT DETECTED Final   Candida krusei NOT DETECTED NOT DETECTED Final   Candida parapsilosis NOT DETECTED NOT DETECTED Final   Candida tropicalis NOT DETECTED NOT DETECTED Final   Cryptococcus neoformans/gattii NOT DETECTED NOT DETECTED Final    Comment: Performed at [facility], [address], [place], [place] [address]  Respiratory (~20 pathogens) panel by PCR     Status: None   Collection Time: 09/03/24  9:40 PM   Specimen: Line, Central; Respiratory  Result Value Ref Range Status   Adenovirus NOT DETECTED NOT DETECTED Final   Coronavirus 229E NOT DETECTED NOT DETECTED Final    Comment: (NOTE) The Coronavirus on the Respiratory Panel, DOES NOT test for the novel  Coronavirus (2019 nCoV)    Coronavirus HKU1 NOT DETECTED NOT DETECTED Final   Coronavirus NL63 NOT DETECTED NOT DETECTED Final   Coronavirus OC43 NOT DETECTED NOT DETECTED Final   Metapneumovirus NOT DETECTED NOT DETECTED Final   Rhinovirus / Enterovirus NOT DETECTED NOT DETECTED Final   Influenza A NOT DETECTED NOT DETECTED Final   Influenza B NOT DETECTED NOT DETECTED  Final   Parainfluenza Virus 1 NOT DETECTED NOT DETECTED Final   Parainfluenza Virus 2 NOT DETECTED NOT DETECTED Final   Parainfluenza Virus 3 NOT DETECTED NOT DETECTED Final   Parainfluenza Virus 4 NOT DETECTED NOT DETECTED Final   Respiratory Syncytial Virus NOT DETECTED NOT DETECTED Final   Bordetella pertussis NOT DETECTED NOT DETECTED Final  Bordetella Parapertussis NOT DETECTED NOT DETECTED Final   Chlamydophila pneumoniae NOT DETECTED NOT DETECTED Final   Mycoplasma pneumoniae NOT DETECTED NOT DETECTED Final    Comment: Performed at [facility], [address], [place], [place] [address]  Resp panel by RT-PCR (RSV, Flu A&B, Covid) Line, Central     Status: None   Collection Time: 09/03/24  9:40 PM   Specimen: Line, Central; Nasal Swab  Result Value Ref Range Status   SARS Coronavirus 2 by RT PCR NEGATIVE NEGATIVE Final   Influenza A by PCR NEGATIVE NEGATIVE Final   Influenza B by PCR NEGATIVE NEGATIVE Final    Comment: (NOTE) The Xpert Xpress SARS-CoV-2/FLU/RSV plus assay is intended as an aid in the diagnosis of influenza from Nasopharyngeal swab specimens and should not be used as a sole basis for treatment. Nasal washings and aspirates are unacceptable for Xpert Xpress SARS-CoV-2/FLU/RSV testing.  Fact Sheet for Patients: BloggerCourse.com  Fact Sheet for Healthcare Providers: SeriousBroker.it  This test is not yet approved or cleared by the [place]  FDA and has been authorized for detection and/or diagnosis of SARS-CoV-2 by FDA under an Emergency Use Authorization (EUA). This EUA will remain in effect (meaning this test can be used) for the duration of the COVID-19 declaration under Section 564(b)(1) of the Act, 21 U.S.C. section 360bbb-3(b)(1), unless the authorization is terminated or revoked.     Resp Syncytial Virus by PCR NEGATIVE NEGATIVE Final    Comment: (NOTE) Fact Sheet for  Patients: BloggerCourse.com  Fact Sheet for Healthcare Providers: SeriousBroker.it  This test is not yet approved or cleared by the [place]  FDA and has been authorized for detection and/or diagnosis of SARS-CoV-2 by FDA under an Emergency Use Authorization (EUA). This EUA will remain in effect (meaning this test can be used) for the duration of the COVID-19 declaration under Section 564(b)(1) of the Act, 21 U.S.C. section 360bbb-3(b)(1), unless the authorization is terminated or revoked.  Performed at [facility], [address], [place], [place] [address]   Body fluid culture w Gram Stain     Status: None   Collection Time: 09/06/24  4:28 PM   Specimen: Peritoneal Washings; Pleural Fluid  Result Value Ref Range Status   Specimen Description PERITONEAL  Final   Special Requests NONE  Final   Gram Stain NO WBC SEEN NO ORGANISMS SEEN   Final   Culture   Final    NO GROWTH 3 DAYS Performed at [facility], [address], [place], [place] [address]    Report Status 09/10/2024 FINAL  Final  Fungus Culture With Stain     Status: None (Preliminary result)   Collection Time: 09/06/24  5:00 PM   Specimen: Pleural Fluid  Result Value Ref Range Status   Fungus Stain Final report  Final    Comment: (NOTE) Performed At: [facility] [address] [place], [place] [id number] Jennette Shorter MD Ey:[id number]    Fungus (Mycology) Culture PENDING  Incomplete   Fungal Source PERITONEAL  Final    Comment: Performed at [facility], [address], [place], [place] [address]  Fungus Culture Result     Status: None   Collection Time: 09/06/24  5:00 PM  Result Value Ref Range Status   Result 1 Comment  Final    Comment: (NOTE) KOH/Calcofluor preparation:  no fungus observed. Performed At: [facility] [address] [place], [place] [id number] Jennette Shorter MD Ey:[id number]   Culture, blood  (  Routine X 2) w Reflex to ID Panel     Status: None (Preliminary result)   Collection Time: 09/07/24  4:13 PM   Specimen: BLOOD  Result Value Ref Range Status   Specimen Description BLOOD FOOT  Final   Special Requests   Final    AEROBIC BOTTLE ONLY Blood Culture results may not be optimal due to an inadequate volume of blood received in culture bottles   Culture   Final    NO GROWTH 3 DAYS Performed at [facility], [address] [place], [place] [address]    Report Status PENDING  Incomplete  Culture, blood (Routine X 2) w Reflex to ID Panel     Status: None (Preliminary result)   Collection Time: 09/07/24  4:13 PM   Specimen: BLOOD  Result Value Ref Range Status   Specimen Description BLOOD FOOT  Final   Special Requests   Final    AEROBIC BOTTLE ONLY Blood Culture results may not be optimal due to an inadequate volume of blood received in culture bottles   Culture   Final    NO GROWTH 3 DAYS Performed at [facility], [address], [place], [place] [address]    Report Status PENDING  Incomplete     Serology:   Imaging: If present, new imagings (plain films, ct scans, and mri) have been personally visualized and interpreted; radiology reports have been reviewed. Decision making incorporated into the Impression / Recommendations.  9/22 cxr 1. Significant decrease in diffuse bilateral interstitial opacities. 2. Persistant dense left retrocardiac opacity. 3. Small left pleural effusion.  9/20 ct abd pelv 1. No acute inflammatory process identified within the abdomen or pelvis. 2. There is cirrhotic liver configuration. There is moderate ascites. No walled-off abscess or pneumoperitoneum. 3. There is small left and small-to-moderate right pleural effusion with associated compressive atelectatic changes in the bilateral lower lobes. There are additional diffuse patchy ground-glass opacities in the middle lobe and bilateral lower lobes. Findings  are nonspecific but new since the prior study. Differential diagnosis includes multilobar pneumonia, pulmonary edema, ARDS, atypical pneumonia, hypersensitivity pneumonitis, etc. 4. Multiple other nonacute observations, as described above   9/20 ct temporal bones 1. Improved aeration of the left middle ear cavity and unchanged left mastoid effusion. No bone erosion. 2. New small right mastoid effusion.   9/15 ct left lower ext No acute fracture or erosive changes are noted.   Considerable subcutaneous edema without definitive abscess.   Incidental note is made of fatty infiltration of the medial head of the gastrocnemius.   Constance ONEIDA Passer, MD [facility] [phone] pager    09/10/2024, 4:58 PM

## 2024-09-10 NOTE — Progress Notes (Signed)
 eLink Physician-Brief Progress Note Patient Name: Rachel Lucas DOB: 11/15/1961 MRN: [id number]   Date of Service  09/10/2024  HPI/Events of Note  Bedside RN requesting SSI coverage. Patient isn't diabetic, but is on TF and they were advanced today. Most recent was 197. Creatinine 1  eICU Interventions  Low dose SSI started Bedside rounding team to adjust coverage accordingly     Intervention Category Intermediate Interventions: Hyperglycemia - evaluation and treatment  Damien T Marcile Fuquay 09/10/2024, 1:28 AM

## 2024-09-10 NOTE — Progress Notes (Signed)
 Nutrition Follow-up  DOCUMENTATION CODES:  Not applicable  INTERVENTION:  Increase tube feeding via Cortrak to goal rate: Vital 1.5 at 57ml/hr ( per day) 60ml ProSource TF20 BID Provides 1600 kcal, 105g protein, free water  daily  Continue to monitor potassium, magnesium  and phosphorus daily until stable, MD to replete as needed - Continue thiamine  as ordered  Renal MVI with  minerals daily  NUTRITION DIAGNOSIS:  Inadequate oral intake related to acute illness, poor appetite, early satiety as evidenced by NPO status. - remains applicable  GOAL:  Patient will meet greater than or equal to 90% of their needs - not met, addressing via tube feed advancement  MONITOR:  Vent status, Labs, Weight trends, TF tolerance, I & O's, Skin  REASON FOR ASSESSMENT:  Ventilator, Consult Other (Comment) (CRRT protocol)  ASSESSMENT:  Pt admitted with c/o LLE swelling and redness. PMH significant for new diagnosis of alcoholic cirrhosis with septic shock though 2/2 LLE cellulitis, persistent otitis media, recent admission for klebsiella bacteremia and PNA.  9/15: admitted 9/16: PEA arrest for 6 minutes with ROSC 9/17: repeat chest xray with ARDS; CRRT initiated for acidosis, shock, worsening AKI and volume status  9/18: s/p paracentesis- yield 1.3L 9/19: post pyloric Cortrak placed (suspected in the LOT per imaging)  Pt remains intubated on vent support MV: 8.6 L/min Temp (24hrs), Avg:97.8 F (36.6 C), Min:92.5 F (33.6 C), Max:98.4 F (36.9 C)  Mentation precludes extubation.   No further episodes of hypoglycemia noted. Pressor requirements decreased.   Notably, tube feeds advanced to 94ml/hr yesterday. Tolerating well.  Spoke with resident MD, per discussion with attending earlier, amenable to advancing tube feeds.   First measured weight (9/16): 63.7 kg Current weight: 54.5 kg (weight trending down with some improvement in edema)  Drains/lines: UOP: 10ml x24  hours Flatus tube: x12 hours + x4 hours (+4 unmeasured occurrences) CRRT: x24 hours  I/O's: +4682ml since admit  Medications: folvite , solu-cortef , SSI 0-9 units q4h, lactulose  TID, thiamine  Drips: IV abx Levo @ 3 mcg/min Potassium phosphorus repletion  Labs:  Potassium 3.3 Phosphorus 2.0 Magnesium  2.7 Albumin  5.2 AST 92 ALT 50 CBG's 150-197 x24 hours  Diet Order:   Diet Order             Diet NPO time specified Except for: Ice Chips, Sips with Meds  Diet effective now                   EDUCATION NEEDS:   No education needs have been identified at this time  Skin:  Skin Assessment: Reviewed RN Assessment  Last BM:  x12 hours + x4 hours (+4 unmeasured occurrences) via flatus tube  Height:   Ht Readings from Last 1 Encounters:  09/07/24 5' 3 (1.6 m)    Weight:   Wt Readings from Last 1 Encounters:  09/10/24 54.5 kg    Ideal Body Weight:  52.3 kg  BMI:  Body mass index is 21.28 kg/m.  Estimated Nutritional Needs:   Kcal:  1500-1700  Protein:  95-105g  Fluid:  1L + UOP  Royce Maris, RDN, LDN Clinical Nutrition See AMiON for contact information.

## 2024-09-10 NOTE — Progress Notes (Signed)
 eLink Physician-Brief Progress Note Patient Name: Rachel Lucas DOB: [birth year] MRN: [id number]   Date of Service  09/10/2024  HPI/Events of Note  Still restless despite fentanyl  50 mcg q1 Seen flailing her arms She is about to get a bath Ongoing RRT  eICU Interventions  Increased Fentanyl  to range 50-100 mcg q 1 (previously 50-200 mcg) Ordered Versed  2 mg prn for agitation Discussed with BSRN to give only when necessary as would like to facilitate liberation from vent soon        Damien ONEIDA Grout 09/10/2024, 11:53 PM

## 2024-09-11 ENCOUNTER — Other Ambulatory Visit: Payer: Self-pay

## 2024-09-11 DIAGNOSIS — G9341 Metabolic encephalopathy: Secondary | ICD-10-CM | POA: Diagnosis not present

## 2024-09-11 DIAGNOSIS — J9601 Acute respiratory failure with hypoxia: Secondary | ICD-10-CM | POA: Diagnosis not present

## 2024-09-11 DIAGNOSIS — R6521 Severe sepsis with septic shock: Secondary | ICD-10-CM | POA: Diagnosis not present

## 2024-09-11 DIAGNOSIS — R21 Rash and other nonspecific skin eruption: Secondary | ICD-10-CM | POA: Diagnosis not present

## 2024-09-11 DIAGNOSIS — A419 Sepsis, unspecified organism: Secondary | ICD-10-CM | POA: Diagnosis not present

## 2024-09-11 DIAGNOSIS — A4150 Gram-negative sepsis, unspecified: Secondary | ICD-10-CM | POA: Diagnosis not present

## 2024-09-11 DIAGNOSIS — K7030 Alcoholic cirrhosis of liver without ascites: Secondary | ICD-10-CM

## 2024-09-11 LAB — CBC
HCT: 23.6 % — ABNORMAL LOW (ref 36.0–46.0)
Hemoglobin: 8.2 g/dL — ABNORMAL LOW (ref 12.0–15.0)
MCH: 34.9 pg — ABNORMAL HIGH (ref 26.0–34.0)
MCHC: 34.7 g/dL (ref 30.0–36.0)
MCV: 100.4 fL — ABNORMAL HIGH (ref 80.0–100.0)
Platelets: 29 K/uL — CL (ref 150–400)
RBC: 2.35 MIL/uL — ABNORMAL LOW (ref 3.87–5.11)
WBC: 28.3 K/uL — ABNORMAL HIGH (ref 4.0–10.5)
nRBC: 0.1 % (ref 0.0–0.2)

## 2024-09-11 LAB — RENAL FUNCTION PANEL
Albumin: 4.6 g/dL (ref 3.5–5.0)
Albumin: 4.7 g/dL (ref 3.5–5.0)
Anion gap: 15 (ref 5–15)
Anion gap: 14 (ref 5–15)
BUN: 20 mg/dL (ref 6–20)
BUN: 23 mg/dL — ABNORMAL HIGH (ref 6–20)
CO2: 23 mmol/L (ref 22–32)
CO2: 22 mmol/L (ref 22–32)
Calcium: 9 mg/dL (ref 8.9–10.3)
Calcium: 8.9 mg/dL (ref 8.9–10.3)
Chloride: 103 mmol/L (ref 98–111)
Chloride: 105 mmol/L (ref 98–111)
Creatinine, Ser: 1.03 mg/dL — ABNORMAL HIGH (ref 0.44–1.00)
Creatinine, Ser: 1.1 mg/dL — ABNORMAL HIGH (ref 0.44–1.00)
GFR, Estimated: >60 mL/min (ref >60)
GFR, Estimated: 58 mL/min — ABNORMAL LOW (ref >60)
Glucose, Bld: 150 mg/dL — ABNORMAL HIGH (ref 70–99)
Glucose, Bld: 202 mg/dL — ABNORMAL HIGH (ref 70–99)
Phosphorus: 1.8 mg/dL — ABNORMAL LOW (ref 2.5–4.6)
Phosphorus: 3.7 mg/dL (ref 2.5–4.6)
Potassium: 3.1 mmol/L — ABNORMAL LOW (ref 3.5–5.1)
Potassium: 3.6 mmol/L (ref 3.5–5.1)
Sodium: 141 mmol/L (ref 135–145)
Sodium: 141 mmol/L (ref 135–145)

## 2024-09-11 LAB — POCT I-STAT 7, (LYTES, BLD GAS, ICA,H+H)
Acid-Base Excess: 3 mmol/L — ABNORMAL HIGH (ref 0.0–2.0)
Bicarbonate: 26.4 mmol/L (ref 20.0–28.0)
Calcium, Ion: 1.18 mmol/L (ref 1.15–1.40)
HCT: 26 % — ABNORMAL LOW (ref 36.0–46.0)
Hemoglobin: 8.8 g/dL — ABNORMAL LOW (ref 12.0–15.0)
O2 Saturation: 99 %
Patient temperature: 37
Potassium: 3.1 mmol/L — ABNORMAL LOW (ref 3.5–5.1)
Sodium: 144 mmol/L (ref 135–145)
TCO2: 27 mmol/L (ref 22–32)
pCO2 arterial: 36.3 mmHg (ref 32–48)
pH, Arterial: 7.469 — ABNORMAL HIGH (ref 7.35–7.45)
pO2, Arterial: 110 mmHg — ABNORMAL HIGH (ref 83–108)

## 2024-09-11 LAB — GLUCOSE, CAPILLARY
Glucose-Capillary: 133 mg/dL — ABNORMAL HIGH (ref 70–99)
Glucose-Capillary: 142 mg/dL — ABNORMAL HIGH (ref 70–99)
Glucose-Capillary: 153 mg/dL — ABNORMAL HIGH (ref 70–99)
Glucose-Capillary: 161 mg/dL — ABNORMAL HIGH (ref 70–99)
Glucose-Capillary: 169 mg/dL — ABNORMAL HIGH (ref 70–99)
Glucose-Capillary: 197 mg/dL — ABNORMAL HIGH (ref 70–99)

## 2024-09-11 LAB — COMPREHENSIVE METABOLIC PANEL WITH GFR
ALT: 48 U/L — ABNORMAL HIGH (ref 0–44)
AST: 84 U/L — ABNORMAL HIGH (ref 15–41)
Albumin: 4.5 g/dL (ref 3.5–5.0)
Alkaline Phosphatase: 104 U/L (ref 38–126)
Anion gap: 13 (ref 5–15)
BUN: 20 mg/dL (ref 6–20)
CO2: 23 mmol/L (ref 22–32)
Calcium: 9 mg/dL (ref 8.9–10.3)
Chloride: 105 mmol/L (ref 98–111)
Creatinine, Ser: 1.1 mg/dL — ABNORMAL HIGH (ref 0.44–1.00)
GFR, Estimated: 58 mL/min — ABNORMAL LOW (ref >60)
Glucose, Bld: 151 mg/dL — ABNORMAL HIGH (ref 70–99)
Potassium: 3.1 mmol/L — ABNORMAL LOW (ref 3.5–5.1)
Sodium: 141 mmol/L (ref 135–145)
Total Bilirubin: 14.1 mg/dL — ABNORMAL HIGH (ref 0.0–1.2)
Total Protein: 6.5 g/dL (ref 6.5–8.1)

## 2024-09-11 LAB — PROTIME-INR
INR: 2.9 — ABNORMAL HIGH (ref 0.8–1.2)
Prothrombin Time: 32.1 s — ABNORMAL HIGH (ref 11.4–15.2)

## 2024-09-11 LAB — MAGNESIUM: Magnesium: 2.4 mg/dL (ref 1.7–2.4)

## 2024-09-11 LAB — CALCIUM, IONIZED
Calcium, Ionized, Serum: 4.7 mg/dL (ref 4.5–5.6)
Calcium, Ionized, Serum: 4.6 mg/dL (ref 4.5–5.6)
Calcium, Ionized, Serum: 4.8 mg/dL (ref 4.5–5.6)

## 2024-09-11 LAB — AMMONIA: Ammonia: 64 umol/L — ABNORMAL HIGH (ref 9–35)

## 2024-09-11 LAB — HAPTOGLOBIN: Haptoglobin: 14 mg/dL — ABNORMAL LOW (ref 33–346)

## 2024-09-11 MED ORDER — PROCHLORPERAZINE MALEATE 10 MG PO TABS: 10.0000 mg | ORAL_TABLET | Freq: Once | ORAL | Status: DC | PRN

## 2024-09-11 MED ORDER — PANTOPRAZOLE SODIUM 40 MG IV SOLR
40.0000 mg | INTRAVENOUS | Status: DC
  Administered 2024-09-12: 40 mg via INTRAVENOUS
  Filled 2024-09-11: qty 10

## 2024-09-11 MED ORDER — OXIDIZED CELLULOSE EX PADS
1.0000 | MEDICATED_PAD | Freq: Once | CUTANEOUS | Status: AC
  Administered 2024-09-11: 1 via TOPICAL
  Filled 2024-09-11: qty 1

## 2024-09-11 MED ORDER — DIGOXIN 0.25 MG/ML IJ SOLN
0.2500 mg | Freq: Once | INTRAMUSCULAR | Status: AC
  Administered 2024-09-11: 0.25 mg via INTRAVENOUS
  Filled 2024-09-11: qty 1

## 2024-09-11 MED ORDER — POTASSIUM PHOSPHATES 15 MMOLE/5ML IV SOLN
30.0000 mmol | Freq: Once | INTRAVENOUS | Status: AC
  Administered 2024-09-11: 30 mmol via INTRAVENOUS
  Filled 2024-09-11: qty 10

## 2024-09-11 NOTE — Progress Notes (Signed)
 eLink Physician-Brief Progress Note Patient Name: Rachel Lucas DOB: 1961/09/20 MRN: [id number]   Date of Service  09/11/2024  HPI/Events of Note  Platelets 29 from 45 Some oozing from HD cath after bathing which has since stopped  eICU Interventions  Will hold off on transfusing at this time unless with signs of brisk bleed Other wise goal platelet > 20 Discussed with BSRN     Intervention Category Intermediate Interventions: Bleeding - evaluation and treatment with blood products  Damien ONEIDA Grout 09/11/2024, 4:58 AM

## 2024-09-11 NOTE — Progress Notes (Signed)
 Admit: 09/03/2024 LOS: 8  42F anuric AKI cardiac arrest, septic shock, GNR bacteremia, decompensated cirrhosis, ARDS  Current CRRT Prescription: Start Date: 9/17 Catheter: Nontunneled IJ BFR: 300 Pre Blood Pump: 300 400 4K DFR: 1200 4K Replacement Rate: 400 4K Goal UF: -50 mL/h Anticoagulation: None Clotting: Infrequent  S: Seen on hemodialysis, family at bedside Off pressors, tolerating UF Some improvement, following commands, vent settings improving -1.8 L yesterday A.m. labs with K of 3.1, phosphorus 1.8, both repleted; likely driven by heavy stool burden   O: 09/22 0701 - 09/23 0700 In: 2149.1 [I.V.:45; NG/GT:1191.5; IV Piggyback:912.6] Out: 3973.7 [Stool:1440]  Filed Weights   09/09/24 0500 09/10/24 0500 09/11/24 0500  Weight: 61 kg 54.5 kg 52.7 kg    Recent Labs  Lab 09/10/24 0408 09/10/24 0916 09/10/24 1518 09/11/24 0335 09/11/24 0339  NA 141   < > 139 141  141 144  K 3.3*   < > 3.3* 3.1*  3.1* 3.1*  CL 105  --  103 103  105  --   CO2 23  --  23 23  23   --   GLUCOSE 183*  --  167* 150*  151*  --   BUN 17  --  17 20  20   --   CREATININE 1.00  --  1.01* 1.03*  1.10*  --   CALCIUM  9.1  --  8.5* 9.0  9.0  --   PHOS 2.0*  --  3.3 1.8*  --    < > = values in this interval not displayed.   Recent Labs  Lab 09/09/24 0941 09/10/24 0408 09/10/24 0916 09/11/24 0335 09/11/24 0339  WBC 18.2* 22.5*  --  28.3*  --   NEUTROABS 16.2*  --   --   --   --   HGB 7.2* 7.7* 8.2* 8.2* 8.8*  HCT 21.1* 22.6* 24.0* 23.6* 26.0*  MCV 101.4* 100.9*  --  100.4*  --   PLT 24* 45*  --  29*  --     Scheduled Meds:  Chlorhexidine  Gluconate Cloth  6 each Topical Once per day on Monday Tuesday Wednesday Thursday Friday Saturday   feeding supplement (PROSource TF20)  60 mL Per Tube BID   folic acid   1 mg Per Tube Daily   insulin  aspart  0-9 Units Subcutaneous Q4H   lactulose   30 g Per Tube TID   levothyroxine   88 mcg Per Tube QAC breakfast   multivitamin  1 tablet  Per Tube QHS   ofloxacin   5 drop Both EARS Daily   mouth rinse  15 mL Mouth Rinse Q2H   [START ON 09/12/2024] pantoprazole  (PROTONIX ) IV  40 mg Intravenous Q24H   sodium chloride  flush  10-40 mL Intracatheter Q12H   thiamine   100 mg Per Tube Daily   Continuous Infusions:  cefTRIAXone  (ROCEPHIN )  IV Stopped (09/10/24 1437)   feeding supplement (VITAL 1.5 CAL) 40 mL/hr at 09/11/24 1200   metronidazole  Stopped (09/11/24 1103)   norepinephrine  (LEVOPHED ) Adult infusion Stopped (09/11/24 0246)   potassium PHOSPHATE  IVPB (in mmol) 85 mL/hr at 09/11/24 1200   prismasol  BGK 4/2.5 1,200 mL/hr at 09/11/24 1029   prismasol  BGK 4/2.5 400 mL/hr at 09/11/24 9371   prismasol  BGK 4/2.5 400 mL/hr at 09/11/24 0628   PRN Meds:.artificial tears, diphenhydrAMINE , fentaNYL  (SUBLIMAZE ) injection, melatonin, ondansetron  (ZOFRAN ) IV, mouth rinse, pneumococcal 20-valent conjugate vaccine, sodium chloride  flush  ABG    Component Value Date/Time   PHART 7.469 (H) 09/11/2024 0339   PCO2ART 36.3 09/11/2024  9660   PO2ART 110 (H) 09/11/2024 0339   HCO3 26.4 09/11/2024 0339   TCO2 27 09/11/2024 0339   ACIDBASEDEF 1.0 09/07/2024 0500   O2SAT 99 09/11/2024 0339   Chronically ill-appearing, jaundiced On ventilator, ET tube present Coarse breath sounds bilaterally Regular, normal S1 and S2 1-2+ lower extremity edema  A/P  Dialysis dependent anuric AKI from ATN on CRRT Cardiac arrest PEA 9/16 Septic shock with GNR bacteremia on vasopressors, per CCM AHRF/ARDS per CCM Decompensated alcoholic cirrhosis with portal hypertension, variceal disease and rising bilirubin Coagulopathy, thrombocytopenia, hematology following, DIC favored, TTP unlikely Hypophosphatemia related to CRRT Hypokalemia on 4K dialysate Anemia  Continue CRRT at current settings Can increase UF to 100 mL/h net negative Replete phosphorus and potassium Poor prognosis   Bernardino Gasman, MD [facility]

## 2024-09-11 NOTE — Progress Notes (Signed)
 eLink Physician-Brief Progress Note Patient Name: AMITY ROES DOB: July 17, 1961 MRN: [id number]   Date of Service  09/11/2024  HPI/Events of Note  Patient went into atrial fibrillation with RVR. She has recently required Levophed  for blood pressure support. She also has advanced liver failure and cardiomyopathy with systolic heart dysfunction. Creatinine is normal.  eICU Interventions  Will trial Digoxin  given constraints outlined above. 0.25 mg dose ordered with plans to give another 0.25 mg in 15 minutes if rate control is not achieved.        Amaliya Whitelaw U Kiersten Coss 09/11/2024, 8:24 PM

## 2024-09-11 NOTE — Progress Notes (Signed)
 [facility]  Date of Admission:  09/03/2024      Lines: 9/17-c right internal jugular hd catheter 9/15-c right femoral triple lumen 9/15-c right femoral arterial line  9/16-c urethral catheter  Other: 9/15 - c stress dose hydrocortisone   Abx: 9/21-c ceftriaxone  9/21-c metronidazole   9/15-21 piptazo 9/15-19 vanc 9/15-22 rifaximin   ASSESSMENT: 63 yo female newly dx'ed alcoholic cirrhosis (presuming all alcohol), recent admission 07/2024 for klebsiella bacteremia, readmitted 09/03/24 with septic shock, in setting of acute 1 day left leg swelling/redness and geographic bullae/erythematous macule  9/15 bcx gnr on anaerobic bottle only not able to result by bcid  9/22 the left leg had vascular surgery input and no acute vascular occlusion determinted. The rash had fairly looked the same. There are purpura on the arms. She remains with multiorgan failure (aki requiring crrt), septic shock on pressor, and with dic like pictures vs decompensated cirrhosis   Clinically she doesn't behave like sjs, which primary team had thought of. I am concerned this is all rather related to decompensated cirrhosis with hrs1. Renal is also following along   She has 7 days of abx already and we plan 10 days of abx treatment total pending further sensitivity testing for the gnr    Reviewed other data: Temporal bone imaging mastoid effusion without bony abnormality incidental and unclear significance. On abx and that would cover acute mastoiditis but I do not have reason to believe she has mastoiditis  ----------------- 09/11/24 assessment Off pressors Pulm/ccm hopeful for extubation Rash lle blister coming off on some, otherwise stable; upper ext purpuric (discussed with nursing staff who has been patient for at least the last 2 days  Wbc up Anemic/thrombocytopenic still  Hydrocortisone  being tapered off    PLAN: Continue ceftriaxone  flagyl  Culture  still pending If worsening diarrhea and increasing wbc still might need to look at gi process Supportive care per renal/pulm ccm Maintain standard isolation precaution Discussed with primary team    Principal Problem:   Septic shock [facility]) Active Problems:   Hepatorenal syndrome (HCC)   AKI (acute kidney injury)   Allergies  Allergen Reactions   Levofloxacin Other (See Comments)   Sulfonamide Derivatives Palpitations    Scheduled Meds:  Chlorhexidine  Gluconate Cloth  6 each Topical Once per day on Monday Tuesday Wednesday Thursday Friday Saturday   feeding supplement (PROSource TF20)  60 mL Per Tube BID   folic acid   1 mg Per Tube Daily   insulin  aspart  0-9 Units Subcutaneous Q4H   lactulose   30 g Per Tube TID   levothyroxine   88 mcg Per Tube QAC breakfast   multivitamin  1 tablet Per Tube QHS   ofloxacin   5 drop Both EARS Daily   mouth rinse  15 mL Mouth Rinse Q2H   [START ON 09/12/2024] pantoprazole  (PROTONIX ) IV  40 mg Intravenous Q24H   sodium chloride  flush  10-40 mL Intracatheter Q12H   thiamine   100 mg Per Tube Daily   Continuous Infusions:  cefTRIAXone  (ROCEPHIN )  IV Stopped (09/11/24 1433)   feeding supplement (VITAL 1.5 CAL) 40 mL/hr at 09/11/24 1500   metronidazole  Stopped (09/11/24 1103)   norepinephrine  (LEVOPHED ) Adult infusion Stopped (09/11/24 0246)   potassium PHOSPHATE  IVPB (in mmol) 85 mL/hr at 09/11/24 1500   prismasol  BGK 4/2.5 1,200 mL/hr at 09/11/24 1448   prismasol  BGK 4/2.5 400 mL/hr at 09/11/24 0628   prismasol  BGK 4/2.5 400 mL/hr at 09/11/24 0628   PRN Meds:.artificial  tears, diphenhydrAMINE , fentaNYL  (SUBLIMAZE ) injection, melatonin, ondansetron  (ZOFRAN ) IV, mouth rinse, pneumococcal 20-valent conjugate vaccine, sodium chloride  flush   SUBJECTIVE: Wbc up Off pressors Planned extubation potentially  No new rash  Review of Systems: ROS All other ROS was negative, except mentioned above     OBJECTIVE: Vitals:   09/11/24 1432  09/11/24 1445 09/11/24 1500 09/11/24 1515  BP:      Pulse: (!) 110 98 (!) 112 (!) 102  Resp: (!) 23 20 19 20   Temp: (!) 97 F (36.1 C) (!) 97.2 F (36.2 C) (!) 97.5 F (36.4 C) 97.7 F (36.5 C)  TempSrc:      SpO2: 100% 99% 99% 98%  Weight:      Height:       Body mass index is 20.58 kg/m.  Physical Exam General/constitutional: ill apeparing, intubated, on pressor, sedated/comatose; fio2 30% HEENT: jaundice; ett in place CV: tachy, no mrg Lungs: clear on vent Abd: Soft Ext: no LE edema; left leg wrapped and reviewed picture Skin: see pictures Neuro: sedated/intubated MSK: no peripheral joint swelling   Central line presence: lines site no purulence   Lab Results Lab Results  Component Value Date   WBC 28.3 (H) 09/11/2024   HGB 8.8 (L) 09/11/2024   HCT 26.0 (L) 09/11/2024   MCV 100.4 (H) 09/11/2024   PLT 29 (LL) 09/11/2024    Lab Results  Component Value Date   CREATININE 1.03 (H) 09/11/2024   CREATININE 1.10 (H) 09/11/2024   BUN 20 09/11/2024   BUN 20 09/11/2024   NA 144 09/11/2024   K 3.1 (L) 09/11/2024   CL 103 09/11/2024   CL 105 09/11/2024   CO2 23 09/11/2024   CO2 23 09/11/2024    Lab Results  Component Value Date   ALT 48 (H) 09/11/2024   AST 84 (H) 09/11/2024   ALKPHOS 104 09/11/2024   BILITOT 14.1 (H) 09/11/2024      Microbiology: Recent Results (from the past 240 hours)  MRSA Next Gen by PCR, Nasal     Status: None   Collection Time: 09/03/24  4:45 PM   Specimen: Nasal Mucosa; Nasal Swab  Result Value Ref Range Status   MRSA by PCR Next Gen NOT DETECTED NOT DETECTED Final    Comment: (NOTE) The GeneXpert MRSA Assay (FDA approved for NASAL specimens only), is one component of a comprehensive MRSA colonization surveillance program. It is not intended to diagnose MRSA infection nor to guide or monitor treatment for MRSA infections. Test performance is not FDA approved in patients less than 71 years old. Performed at [facility]  Lab, [address] [place], [place] [address]   Blood culture (routine x 2)     Status: None (Preliminary result)   Collection Time: 09/03/24  9:30 PM   Specimen: BLOOD  Result Value Ref Range Status   Specimen Description BLOOD SITE NOT SPECIFIED  Final   Special Requests   Final    BOTTLES DRAWN AEROBIC AND ANAEROBIC Blood Culture results may not be optimal due to an inadequate volume of blood received in culture bottles   Culture  Setup Time   Final    GRAM NEGATIVE RODS ANAEROBIC BOTTLE ONLY CRITICAL VALUE NOTED.  VALUE IS CONSISTENT WITH PREVIOUSLY REPORTED AND CALLED VALUE.    Culture   Final    GRAM NEGATIVE RODS CULTURE REINCUBATED FOR BETTER GROWTH Performed at [facility] Lab, [address] [place], [place] [address]    Report Status PENDING  Incomplete  Blood culture (routine x 2)     Status: None (Preliminary result)   Collection Time: 09/03/24  9:30 PM   Specimen: BLOOD  Result Value Ref Range Status   Specimen Description BLOOD SITE NOT SPECIFIED  Final   Special Requests   Final    BOTTLES DRAWN AEROBIC AND ANAEROBIC Blood Culture results may not be optimal due to an inadequate volume of blood received in culture bottles   Culture  Setup Time   Final    GRAM NEGATIVE RODS ANAEROBIC BOTTLE ONLY CRITICAL RESULT CALLED TO, READ BACK BY AND VERIFIED WITH: PHARMD BLAKE WANNARAT ON 09/05/24 @ 1525 BY DRT    Culture   Final    GRAM NEGATIVE RODS CULTURE REINCUBATED FOR BETTER GROWTH Performed at [facility] Lab, [address], [place], [place] [address]    Report Status PENDING  Incomplete  Blood Culture ID Panel (Reflexed)     Status: None   Collection Time: 09/03/24  9:30 PM  Result Value Ref Range Status   Enterococcus faecalis NOT DETECTED NOT DETECTED Final   Enterococcus Faecium NOT DETECTED NOT DETECTED Final   Listeria monocytogenes NOT DETECTED NOT DETECTED Final   Staphylococcus species NOT DETECTED NOT DETECTED Final   Staphylococcus aureus (BCID)  NOT DETECTED NOT DETECTED Final   Staphylococcus epidermidis NOT DETECTED NOT DETECTED Final   Staphylococcus lugdunensis NOT DETECTED NOT DETECTED Final   Streptococcus species NOT DETECTED NOT DETECTED Final   Streptococcus agalactiae NOT DETECTED NOT DETECTED Final   Streptococcus pneumoniae NOT DETECTED NOT DETECTED Final   Streptococcus pyogenes NOT DETECTED NOT DETECTED Final   A.calcoaceticus-baumannii NOT DETECTED NOT DETECTED Final   Bacteroides fragilis NOT DETECTED NOT DETECTED Final   Enterobacterales NOT DETECTED NOT DETECTED Final   Enterobacter cloacae complex NOT DETECTED NOT DETECTED Final   Escherichia coli NOT DETECTED NOT DETECTED Final   Klebsiella aerogenes NOT DETECTED NOT DETECTED Final   Klebsiella oxytoca NOT DETECTED NOT DETECTED Final   Klebsiella pneumoniae NOT DETECTED NOT DETECTED Final   Proteus species NOT DETECTED NOT DETECTED Final   Salmonella species NOT DETECTED NOT DETECTED Final   Serratia marcescens NOT DETECTED NOT DETECTED Final   Haemophilus influenzae NOT DETECTED NOT DETECTED Final   Neisseria meningitidis NOT DETECTED NOT DETECTED Final   Pseudomonas aeruginosa NOT DETECTED NOT DETECTED Final   Stenotrophomonas maltophilia NOT DETECTED NOT DETECTED Final   Candida albicans NOT DETECTED NOT DETECTED Final   Candida auris NOT DETECTED NOT DETECTED Final   Candida glabrata NOT DETECTED NOT DETECTED Final   Candida krusei NOT DETECTED NOT DETECTED Final   Candida parapsilosis NOT DETECTED NOT DETECTED Final   Candida tropicalis NOT DETECTED NOT DETECTED Final   Cryptococcus neoformans/gattii NOT DETECTED NOT DETECTED Final    Comment: Performed at [facility] Lab, [address] [place], [place] [address]  Respiratory (~20 pathogens) panel by PCR     Status: None   Collection Time: 09/03/24  9:40 PM   Specimen: Line, Central; Respiratory  Result Value Ref Range Status   Adenovirus NOT DETECTED NOT DETECTED Final   Coronavirus 229E NOT  DETECTED NOT DETECTED Final    Comment: (NOTE) The Coronavirus on the Respiratory Panel, DOES NOT test for the novel  Coronavirus (2019 nCoV)    Coronavirus HKU1 NOT DETECTED NOT DETECTED Final   Coronavirus NL63 NOT DETECTED NOT DETECTED Final   Coronavirus OC43 NOT DETECTED NOT DETECTED Final   Metapneumovirus NOT DETECTED NOT DETECTED  Final   Rhinovirus / Enterovirus NOT DETECTED NOT DETECTED Final   Influenza A NOT DETECTED NOT DETECTED Final   Influenza B NOT DETECTED NOT DETECTED Final   Parainfluenza Virus 1 NOT DETECTED NOT DETECTED Final   Parainfluenza Virus 2 NOT DETECTED NOT DETECTED Final   Parainfluenza Virus 3 NOT DETECTED NOT DETECTED Final   Parainfluenza Virus 4 NOT DETECTED NOT DETECTED Final   Respiratory Syncytial Virus NOT DETECTED NOT DETECTED Final   Bordetella pertussis NOT DETECTED NOT DETECTED Final   Bordetella Parapertussis NOT DETECTED NOT DETECTED Final   Chlamydophila pneumoniae NOT DETECTED NOT DETECTED Final   Mycoplasma pneumoniae NOT DETECTED NOT DETECTED Final    Comment: Performed at [facility] Lab, [address], [place], [place] [address]  Resp panel by RT-PCR (RSV, Flu A&B, Covid) Line, Central     Status: None   Collection Time: 09/03/24  9:40 PM   Specimen: Line, Central; Nasal Swab  Result Value Ref Range Status   SARS Coronavirus 2 by RT PCR NEGATIVE NEGATIVE Final   Influenza A by PCR NEGATIVE NEGATIVE Final   Influenza B by PCR NEGATIVE NEGATIVE Final    Comment: (NOTE) The Xpert Xpress SARS-CoV-2/FLU/RSV plus assay is intended as an aid in the diagnosis of influenza from Nasopharyngeal swab specimens and should not be used as a sole basis for treatment. Nasal washings and aspirates are unacceptable for Xpert Xpress SARS-CoV-2/FLU/RSV testing.  Fact Sheet for Patients: BloggerCourse.com  Fact Sheet for Healthcare Providers: SeriousBroker.it  This test is not yet approved or  cleared by the [place]  FDA and has been authorized for detection and/or diagnosis of SARS-CoV-2 by FDA under an Emergency Use Authorization (EUA). This EUA will remain in effect (meaning this test can be used) for the duration of the COVID-19 declaration under Section 564(b)(1) of the Act, 21 U.S.C. section 360bbb-3(b)(1), unless the authorization is terminated or revoked.     Resp Syncytial Virus by PCR NEGATIVE NEGATIVE Final    Comment: (NOTE) Fact Sheet for Patients: BloggerCourse.com  Fact Sheet for Healthcare Providers: SeriousBroker.it  This test is not yet approved or cleared by the [place]  FDA and has been authorized for detection and/or diagnosis of SARS-CoV-2 by FDA under an Emergency Use Authorization (EUA). This EUA will remain in effect (meaning this test can be used) for the duration of the COVID-19 declaration under Section 564(b)(1) of the Act, 21 U.S.C. section 360bbb-3(b)(1), unless the authorization is terminated or revoked.  Performed at [facility] Lab, [address], [place], [place] [address]   Body fluid culture w Gram Stain     Status: None   Collection Time: 09/06/24  4:28 PM   Specimen: Peritoneal Washings; Pleural Fluid  Result Value Ref Range Status   Specimen Description PERITONEAL  Final   Special Requests NONE  Final   Gram Stain NO WBC SEEN NO ORGANISMS SEEN   Final   Culture   Final    NO GROWTH 3 DAYS Performed at [facility], [address], [place], [place] [address]    Report Status 09/10/2024 FINAL  Final  Fungus Culture With Stain     Status: None (Preliminary result)   Collection Time: 09/06/24  5:00 PM   Specimen: Pleural Fluid  Result Value Ref Range Status   Fungus Stain Final report  Final    Comment: (NOTE) Performed At: [facility] [address] [place], [place] [id number] Jennette Shorter MD Ey:[id number]    Fungus (  Mycology) Culture  PENDING  Incomplete   Fungal Source PERITONEAL  Final    Comment: Performed at [facility] Lab, [address], [place], [place] [address]  Fungus Culture Result     Status: None   Collection Time: 09/06/24  5:00 PM  Result Value Ref Range Status   Result 1 Comment  Final    Comment: (NOTE) KOH/Calcofluor preparation:  no fungus observed. Performed At: [facility] [address] [place], [place] [id number] Jennette Shorter MD Ey:[id number]   Culture, blood (Routine X 2) w Reflex to ID Panel     Status: None (Preliminary result)   Collection Time: 09/07/24  4:13 PM   Specimen: BLOOD  Result Value Ref Range Status   Specimen Description BLOOD FOOT  Final   Special Requests   Final    AEROBIC BOTTLE ONLY Blood Culture results may not be optimal due to an inadequate volume of blood received in culture bottles   Culture   Final    NO GROWTH 4 DAYS Performed at [facility] Lab, [address] [place], [place] [address]    Report Status PENDING  Incomplete  Culture, blood (Routine X 2) w Reflex to ID Panel     Status: None (Preliminary result)   Collection Time: 09/07/24  4:13 PM   Specimen: BLOOD  Result Value Ref Range Status   Specimen Description BLOOD FOOT  Final   Special Requests   Final    AEROBIC BOTTLE ONLY Blood Culture results may not be optimal due to an inadequate volume of blood received in culture bottles   Culture   Final    NO GROWTH 4 DAYS Performed at [facility] Lab, [address], [place], [place] [address]    Report Status PENDING  Incomplete     Serology:   Imaging: If present, new imagings (plain films, ct scans, and mri) have been personally visualized and interpreted; radiology reports have been reviewed. Decision making incorporated into the Impression / Recommendations.  9/22 cxr 1. Significant decrease in diffuse bilateral interstitial opacities. 2. Persistant dense left retrocardiac opacity. 3. Small left pleural  effusion.  9/20 ct abd pelv 1. No acute inflammatory process identified within the abdomen or pelvis. 2. There is cirrhotic liver configuration. There is moderate ascites. No walled-off abscess or pneumoperitoneum. 3. There is small left and small-to-moderate right pleural effusion with associated compressive atelectatic changes in the bilateral lower lobes. There are additional diffuse patchy ground-glass opacities in the middle lobe and bilateral lower lobes. Findings are nonspecific but new since the prior study. Differential diagnosis includes multilobar pneumonia, pulmonary edema, ARDS, atypical pneumonia, hypersensitivity pneumonitis, etc. 4. Multiple other nonacute observations, as described above   9/20 ct temporal bones 1. Improved aeration of the left middle ear cavity and unchanged left mastoid effusion. No bone erosion. 2. New small right mastoid effusion.   9/15 ct left lower ext No acute fracture or erosive changes are noted.   Considerable subcutaneous edema without definitive abscess.   Incidental note is made of fatty infiltration of the medial head of the gastrocnemius.   Constance ONEIDA Passer, MD [facility] [phone] pager    09/11/2024, 3:30 PM

## 2024-09-11 NOTE — TOC Progression Note (Signed)
 Transition of Care [facility]) - Progression Note    Patient Details  Name: Rachel Lucas MRN: [id number] Date of Birth: 28-Nov-1961  [facility] of [facility] CM/SW Contact  Tom-Johnson, Harvest Muskrat, RN Phone Number: 09/11/2024, 2:21 PM  Clinical Narrative:     Patient with worsening Delirium, had a PEA Arrest with 6 minutes of CPR on 09/04/24. Patient was intubated and sedated. Nephrology consulted and CRRT was started. On IV abx, ID following.  Patient not Medically ready for discharge.  CM will continue to follow as patient progresses with care towards discharge.                        Expected Discharge Plan and Services                                               Social Drivers of Health (SDOH) Interventions SDOH Screenings   Food Insecurity: No Food Insecurity (09/03/2024)  Housing: Low Risk  (09/03/2024)  Transportation Needs: No Transportation Needs (09/03/2024)  Utilities: Not At Risk (09/03/2024)  Depression (PHQ2-9): Low Risk  (08/31/2024)  Social Connections: Patient Declined (09/03/2024)  Tobacco Use: Medium Risk (09/04/2024)    Readmission Risk Interventions    09/04/2024   12:52 PM 08/13/2024    5:06 PM  Readmission Risk Prevention Plan  Transportation Screening Complete Complete  PCP or Specialist Appt within 5-7 Days  Complete  Home Care Screening  Complete  Medication Review (RN CM)  Referral to Pharmacy  Medication Review (RN Care Manager) Referral to Pharmacy   PCP or Specialist appointment within 3-5 days of discharge Complete   HRI or Home Care Consult Complete   SW Recovery Care/Counseling Consult Complete   Palliative Care Screening Not Applicable   Skilled Nursing Facility Not Applicable

## 2024-09-11 NOTE — Progress Notes (Addendum)
 NAME:  Rachel Lucas, MRN:  [id number], DOB:  03-Nov-1961, LOS: 8 ADMISSION DATE:  09/03/2024, CONSULTATION DATE:  09/05/2024 REFERRING MD:  EDP, CHIEF COMPLAINT:  Left leg pain   History of Present Illness:  Patient is a 63 yo female with new diagnosis of alcoholic cirrhosis with septic shock thought 2/2 LLE cellulitis, does have persistent otitis media from recent prior admission for Klebsiella pneumonia and bacteremia.    Patient had concern for single episode of melena the day prior to admission, but subsequently had normal stools.  Had no cough, congestion, or hematemesis.  Did endorse worsening LLE swelling and redness with some pain the day of admission.   Recently, patient was admitted for Klebciella bacteremia and pneumonia in setting of accidental ear canal perforation with Q-tip (8/20-8/27).   In the ED, patient was unresponsive to fluids with BP 60/30s and PCCM was consulted for admission.  Patient stated that her baseline SBP is in the 90s.   Pertinent  Medical History  Fam hx of VWD but test 8/25 neg Alcoholic cirrhosis recent new diagnosis Sober for 1 month per self and husband   Significant Hospital Events: Including procedures, antibiotic start and stop dates in addition to other pertinent events   9/15 - Admitted, working dx LE cellulitis and SBP in setting of nex dx of ETOH related cirrhosis.  started Zosyn  (9/15-) and linezolid  (9/15-), GI consulted with no concern for GIB, CVC and Art line placed in PM 9/16 - Worsening delirium, PEA arrest for 6 minutes with ROSC, NG tube in place 9/17 - EEG neg for seizure. ECHO: EF 35-40%. Became hyperglycemic after switch to D10 fluids. Had minimal urine output at 50 mL. Nephro consulted for CRRT. HD cath placed.  CRRT started. IR consulted. Felt not stable for para. Stating: coagulopathy, shock and hypothermia as factors. 9/18 - Lactic acid improving. Paracentesis, negative for SBP.  Vascular surgery consulted for cold LLE, no  interventions indicated, no evidence of limb ischemia 9/19 - ID consulted, added inflammatory marker eval raising concern for vasculitis. Pressors down significantly after starting the epinephrine . Cortrak placed. For intolerance of TF, inflammatory eval: sed rate 4, lactate 6.6->4.6. tbili 17 (rising from 11.9). wbc still elevated. Hgb 7.5 to 7. Plts 49->43 9/20 - hemodynamics a little better. Still w/ sig WBC ct. Prob steroids but repeating abd US  and CT imaging of head negative to r/o mastoiditis. 9/21 - All CT imaging including CT abdomen pelvis as well as CT temporal bone all reassuring no new evidence of infection.  Ultrasound abdomen negative.  Platelets down again, sending additional eval to rule out TTP.  Getting more platelets transfused.  Change abx to CTX and metronidazole . 9/22 - Off vasopressin , minimal sedation but remains encephalopathic. 9/23 - Off pressors, more alert and following instructions.  Weaning from vent.   Interim History / Subjective:  Overnight, patient was restless and Fentanyl  was adjusted to 50-100 mcg Q1h with use of CPOT scale goal <3.  After continuing agitation, 2 mg of Versed  PRN was added with instructions to use sparingly if able given plan to facilitate SBT and liberation from vent soon.  This AM, patient is now off pressors and more alert.  She is following instructions, able to move toes consistently, though has weak hand squeeze.  She is weaning from ventilator this AM.   Objective    Blood pressure (!) 136/53, pulse (!) 108, temperature 97.7 F (36.5 C), resp. rate (!) 23, height 5' 3 (1.6 m), weight 52.7  kg, SpO2 100%. CVP:  [0 mmHg-23 mmHg] 0 mmHg  Vent Mode: PRVC FiO2 (%):  [30 %-40 %] 30 % Set Rate:  [20 bmp] 20 bmp Vt Set:  [420 mL] 420 mL PEEP:  [5 cmH20-8 cmH20] 8 cmH20 Plateau Pressure:  [15 cmH20-21 cmH20] 15 cmH20   Intake/Output Summary (Last 24 hours) at 09/11/2024 0710 Last data filed at 09/11/2024 0700 Gross per 24 hour  Intake  2149.09 ml  Output 3973.7 ml  Net -1824.61 ml   Filed Weights   09/09/24 0500 09/10/24 0500 09/11/24 0500  Weight: 61 kg 54.5 kg 52.7 kg    Examination: General: Chronically ill appearing and intubated, but more alert and looking around. HEENT: NCAT.  Scleral icterus and small amount of edema.  Tracking movements.  PERRLA.  Crusted blood on dry lips, no active bleeding.  ET tube in place. Cardiovascular: Regular rate and rhythm. Normal S1/S2. No murmurs, rubs, or gallops appreciated. Pulmonary: Clear bilaterally to ascultation. No wheezes, crackles, or rhonchi. On ventilator PSV (40% FiO2, 5 PEEP). Abdominal: Mildly distended abdomen, normoactive bowel sounds. Extremities: 1+ peripheral edema bilaterally.  LLE wrapped with dressing, some drainage of serosanguinous fluid noted through dressing.  Bullae forming on L foot overlying extensive violacious discoloration.  Palpable 1+ DP and PT pulses bilaterally.  Capillary refill 2-3 seconds in LLE. Neuro: Following instructions to wiggle toes.  Responds to voice.  Reacts to simple questions.   Resolved problem list  Septic shock Metabolic/hepatic encephalopathy ICU delirium   Assessment and Plan   Severe sepsis 2/2 GNR bacteremia: working Dx LLE cellulitis, further complicated by acute systolic heart failure WBC continue trending up but reassuringly now off pressors and with resolved encephalopathy.  BCx with GNRs in anaerobic bottle only, but not speciated and reincubated for better growth.  Now on day 8 of antibiotics and ID planning for 10 days total pending reincubated culture results. Plan: - Not on norepi this AM, titrate norepinephrine  to goal MAP >65 - Stress dose steroids decreased to Q12 on 9/21; taper and DC after 9/23 - Cultures from 9/15 reincubated for better growth, solely GNRs noted - Metronidazole  (9/21>), CTX (9/21>) - s/p Zosyn  (9/15-9/21) - No GDMT given shock state   LLE bullae and mottling, possibly 2/2  cellulitis Possible this is secondary to cellulitis versus volume overload, though has been progressively worsening despite treatment with antibiotics and improvement in pressor requirement, lactic acid, MAP.  Considered SJS on 9/22 and discontinued rifaximin .  Neg for DVT on 9/17, felt exacerbated by hydrostatic pressure and volume overload. Evaluated by vascular on 9/18 and 9/19.  No concern for limb ischemia. - May ultimately require advanced wound care therapies if recovers from acute illness - Close pulse checks - Appreciate wound care assistance - Volume removal w/ CRRT continuing   Acute hypoxic respiratory failure 2/2 acute lung injury Remains on ventilator but now following instructions consistently.  Weaning went well this AM for ~2 hours.  Consider extubation in the next 24-48 hours. Plan: - Continuing low tidal ventilation with lung protective strategy - Weaning PEEP/FiO2 for saturations greater than 92% - Goal plateau pressure <30, driving pressure <84 - VAP bundle - PPI decreased to daily - As needed fentanyl  50-100 mcg Q1h - Minimize sedation, okay for one-time dose of Versed  overnight but avoid as possible   AKI 2/2 ischemic ATN post PEA arrest Hypokalemia and hypophosphatemia, intermittent Continue CRRT per Nephro.  Now net +3L this admission and continuing to improve fluid balance via CRRT.  Remains anuric.   CRRT goal net even or negative on volume status, then trial discontinuation. Plan: - Keep foley for now - Daily CMP - Electrolyte management and CRRT per Nephro - Adjust meds for CRRT and renal dosing - Strict I&Os   Decompensated alcoholic cirrhosis with ascites, Hx gastric varices, and superimposed shock liver Hepatic encephalopathy Encephalopathy greatly improved.  Ammonia 64 this AM, but x5 bowel movements in past 24 hours.  Transaminases and bili stable.  Not a candidate for transplant.  Had sludge on US  on 16th, f/u abd US  still w/ sludge but no evidence  of stones or cystitis. Plan: - Trending INR, AM hepatic panels - Continue lactulose  30 mg TID - Rifaximin  discontinued as above given concern for SJS - Monitor frequency of BMs daily - Holding home propranolol  and spiro in setting of sepsis   Critical illness related protein calorie malnutrition Liquid stools Appreciate RD consult, post-pyloric Cortrack placed 9/20. Plan: - Advance trickle feeds via Cortrak as able - Stopped stool softeners as she is on lactulose  and think gut edema affecting absorption   Chronic macrocytic anemia Status post 1 unit PRBC 9/16; and got another unit of blood 9/20. Plan: - Holding AC given coagulopathy - AM CBC - Transfusion threshold Hgb <7   Acute on chronic Thrombocytopenia 2/2 sepsis superimposed on cirrhosis, probable DIC Plts again down to 29 today.  Received platelets 9/17, again prior to paracentesis on 9/18, linezolid  stopped 9/18, again received platelets 9/21.  Also not a candidate for [facility] given coagulopathy.  Retics 1.9%, suspect some transient myelosuppression in setting of critical illness.  Following labs for TTP. Plan: - Heme following, appreciate recs - Plt transfusion threshold <20 per Heme - If needs procedures, threshold Plts >50 - RLE SCDs - f/u [id number], haptoglobin   Coagulopathy (septic coagulopathy/DIC superimposed on  decompensated cirrhosis). INR remains stable. Plan: - Trend INR   Hypothyroidism Plan: - Continue levothyroxine  88 mcg     Glycemic control Was hypoglycemic.  Now on steroids, mildly hyperglycemia.  A1c 4.3. 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 Tobie with [facility] 9/16, who related that he has a no suspicion for mastoiditis after reviewing CT imaging.  Noted that patient has a packing wick in place and TM is open, so fluid would be expected but does not suspect mastoiditis based on  radiography. Repeated imaging 9/20 improved aeration of middle ear cavity with no evidence of mastoiditis. Plan: - Ciprodex  drops changed to ofloxacin  (family request and cleared w/ ENT) both ears (started 9/20) - Monitor postauricular area for proptosis   GI PPx: Pantoprazole  12 mg daily DVT PPx: Heparin  held given thrombocytopenia, unable to place SCDs given    Labs   CBC: Recent Labs  Lab 09/08/24 0316 09/08/24 0322 09/09/24 0320 09/09/24 0941 09/10/24 0408 09/10/24 0916 09/11/24 0335 09/11/24 0339  WBC 22.6*  --  20.2* 18.2* 22.5*  --  28.3*  --   NEUTROABS  --   --   --  16.2*  --   --   --   --   HGB 7.0*   < > 7.6* 7.2* 7.7* 8.2* 8.2* 8.8*  HCT 20.6*   < > 22.2* 21.1* 22.6* 24.0* 23.6* 26.0*  MCV 107.3*  --  101.4* 101.4* 100.9*  --  100.4*  --   PLT 43*  --  19*  20* 24* 45*  --  29*  --    < > = values in this interval not displayed.    Basic Metabolic Panel: Recent Labs  Lab 09/07/24 0500 09/07/24 1545 09/08/24 0316 09/08/24 0322 09/09/24 0320 09/09/24 1801 09/10/24 0408 09/10/24 0916 09/10/24 1518 09/11/24 0335 09/11/24 0339  NA 135  135   < > 138   < > 139 140 141 144 139 141  141 144  K 3.9  3.9   < > 3.6   < > 3.3* 3.1* 3.3* 3.3* 3.3* 3.1*  3.1* 3.1*  CL 99   < > 100   < > 105 102 105  --  103 103  105  --   CO2 20*   < > 21*   < > 23 23 23   --  23 23  23   --   GLUCOSE 85   < > 137*   < > 168* 184* 183*  --  167* 150*  151*  --   BUN 14   < > 14   < > 16 16 17   --  17 20  20   --   CREATININE 1.51*   < > 1.12*   < > 0.94 1.00 1.00  --  1.01* 1.03*  1.10*  --   CALCIUM  8.3*   < > 8.6*   < > 8.5* 8.6* 9.1  --  8.5* 9.0  9.0  --   MG 2.5*  --  2.7*  --  2.6*  --  2.7*  --   --  2.4  --   PHOS 3.2  3.2   < > 1.8*  1.8*   < > 2.1* 3.2 2.0*  --  3.3 1.8*  --    < > = values in this interval not displayed.   GFR: Estimated Creatinine Clearance: 48 mL/min (A) (by C-G formula based on SCr of 1.03 mg/dL (H)). Recent Labs  Lab 09/06/24 1430  09/07/24 0500 09/08/24 0316 09/09/24 0320 09/09/24 0941 09/10/24 0408 09/11/24 0335  WBC  --  20.4* 22.6* 20.2* 18.2* 22.5* 28.3*  LATICACIDVEN 6.8* 6.6* 4.6* 2.4*  --   --   --     Liver Function Tests: Recent Labs  Lab 09/06/24 0445 09/06/24 1649 09/08/24 0316 09/08/24 1843 09/09/24 0941 09/09/24 1801 09/10/24 0408 09/10/24 1518 09/11/24 0335  AST 82*  --  86*  --  88*  --  92*  --  84*  ALT 38  --  39  --  43  --  50*  --  48*  ALKPHOS 36*  --  63  --  71  --  92  --  104  BILITOT 11.9*  --  17.0*  --  18.4*  --  18.5*  --  14.1*  PROT 5.5*  --  6.3*  --  6.2*  --  7.2  --  6.5  ALBUMIN  3.6  3.6   < > 4.5  4.6   < > 4.6 4.8 5.2* 4.9 4.6  4.5   < > = values in this interval not displayed.   Recent Labs  Lab 09/05/24 1738 09/06/24 0445  LIPASE 37 42  AMYLASE 41  --    Recent Labs  Lab 09/06/24 0911 09/11/24 0336  AMMONIA 45* 64*    ABG    Component Value Date/Time   PHART 7.469 (H) 09/11/2024 0339   PCO2ART 36.3 09/11/2024 0339   PO2ART 110 (H) 09/11/2024 9660  HCO3 26.4 09/11/2024 0339   TCO2 27 09/11/2024 0339   ACIDBASEDEF 1.0 09/07/2024 0500   O2SAT 99 09/11/2024 0339     Coagulation Profile: Recent Labs  Lab 09/07/24 0500 09/08/24 0316 09/09/24 0320 09/10/24 0408 09/11/24 0335  INR 3.2* 3.2* 3.1* 2.9* 2.9*    Cardiac Enzymes: Recent Labs  Lab 09/04/24 1836 09/05/24 0320 09/06/24 1141  CKTOTAL 318* 476* 408*    HbA1C: Hgb A1c MFr Bld  Date/Time Value Ref Range Status  09/10/2024 04:08 AM 4.3 (L) 4.8 - 5.6 % Final    Comment:    (NOTE) Diagnosis of Diabetes The following HbA1c ranges recommended by the American Diabetes Association (ADA) may be used as an aid in the diagnosis of diabetes mellitus.  Hemoglobin             Suggested A1C NGSP%              Diagnosis  <5.7                   Non Diabetic  5.7-6.4                Pre-Diabetic  >6.4                   Diabetic  <7.0                   Glycemic control  for                       adults with diabetes.      CBG: Recent Labs  Lab 09/10/24 1108 09/10/24 1518 09/10/24 1933 09/10/24 2339 09/11/24 0342  GLUCAP 157* 152* 73 188* 142*    Past Medical History:  She,  has a past medical history of Hypothyroidism, Thyroid  disease, and Von Willebrand disease (HCC).   Surgical History:   Past Surgical History:  Procedure Laterality Date   CYST REMOVAL NECK  1995   IR PARACENTESIS  09/06/2024     Social History:   reports that she quit smoking about 34 years ago. Her smoking use included cigarettes. She started smoking about 44 years ago. She has a 2 pack-year smoking history. She does not have any smokeless tobacco history on file. She reports that she does not currently use alcohol. She reports that she does not use drugs.   Family History:  Her family history includes Asthma in her mother; Eczema in her mother; Heart Problems in her father.   Allergies Allergies  Allergen Reactions   Levofloxacin Other (See Comments)   Sulfonamide Derivatives Palpitations     Home Medications  Prior to Admission medications   Medication Sig Start Date End Date Taking? Authorizing Provider  folic acid  (FOLVITE ) 1 MG tablet Take 1 tablet (1 mg total) by mouth daily. 08/16/24  Yes Dennise Lavada POUR, MD  furosemide  (LASIX ) 20 MG tablet Take 1 tablet (20 mg total) by mouth daily. 08/30/24  Yes Craig Palma R, PA-C  lactulose , encephalopathy, (ENULOSE ) 10 GM/15ML SOLN Take 15 mLs (10 g total) by mouth 2 (two) times daily. 08/21/24  Yes Federico Rosario BROCKS, MD  levothyroxine  (SYNTHROID ) 88 MCG tablet Take 1 tablet (88 mcg total) by mouth daily before breakfast. 08/15/24  Yes Singh, Prashant K, MD  midodrine  (PROAMATINE ) 5 MG tablet Take 1 tablet (5 mg total) by mouth 2 (two) times daily with a meal. 08/30/24  Yes Craig Palma R, PA-C  propranolol  (INDERAL ) 10 MG tablet Take  1 tablet (10 mg total) by mouth 2 (two) times daily. 08/30/24  Yes Craig Alan SAUNDERS,  PA-C  spironolactone  (ALDACTONE ) 50 MG tablet Take 1 tablet (50 mg total) by mouth daily. 08/30/24  Yes Craig Alan R, PA-C  thiamine  (VITAMIN B1) 100 MG tablet Take 1 tablet (100 mg total) by mouth daily. 08/16/24  Yes Singh, Prashant K, MD  potassium chloride  SA (KLOR-CON  M) 20 MEQ tablet Take 1 tablet (20 mEq total) by mouth daily for 2 days. 08/21/24 08/31/24  Federico Rosario BROCKS, MD    Elpidio Thielen Toma, MD PGY-2, ICU resident 09/11/24, 7:10 AM

## 2024-09-12 ENCOUNTER — Other Ambulatory Visit: Payer: Self-pay

## 2024-09-12 DIAGNOSIS — R21 Rash and other nonspecific skin eruption: Secondary | ICD-10-CM | POA: Diagnosis not present

## 2024-09-12 DIAGNOSIS — R6521 Severe sepsis with septic shock: Secondary | ICD-10-CM | POA: Diagnosis not present

## 2024-09-12 DIAGNOSIS — J9601 Acute respiratory failure with hypoxia: Secondary | ICD-10-CM | POA: Diagnosis not present

## 2024-09-12 DIAGNOSIS — G9341 Metabolic encephalopathy: Secondary | ICD-10-CM | POA: Diagnosis not present

## 2024-09-12 DIAGNOSIS — A419 Sepsis, unspecified organism: Secondary | ICD-10-CM | POA: Diagnosis not present

## 2024-09-12 DIAGNOSIS — K7030 Alcoholic cirrhosis of liver without ascites: Secondary | ICD-10-CM | POA: Diagnosis not present

## 2024-09-12 DIAGNOSIS — A4150 Gram-negative sepsis, unspecified: Secondary | ICD-10-CM | POA: Diagnosis not present

## 2024-09-12 LAB — CBC
HCT: 27 % — ABNORMAL LOW (ref 36.0–46.0)
Hemoglobin: 9.5 g/dL — ABNORMAL LOW (ref 12.0–15.0)
MCH: 35.3 pg — ABNORMAL HIGH (ref 26.0–34.0)
MCHC: 35.2 g/dL (ref 30.0–36.0)
MCV: 100.4 fL — ABNORMAL HIGH (ref 80.0–100.0)
Platelets: 38 K/uL — ABNORMAL LOW (ref 150–400)
RBC: 2.69 MIL/uL — ABNORMAL LOW (ref 3.87–5.11)
RDW: 23.7 % — ABNORMAL HIGH (ref 11.5–15.5)
WBC: 35.2 K/uL — ABNORMAL HIGH (ref 4.0–10.5)
nRBC: 0.4 % — ABNORMAL HIGH (ref 0.0–0.2)

## 2024-09-12 LAB — RENAL FUNCTION PANEL
Albumin: 4.5 g/dL (ref 3.5–5.0)
Albumin: 4.9 g/dL (ref 3.5–5.0)
Anion gap: 11 (ref 5–15)
Anion gap: 14 (ref 5–15)
BUN: 24 mg/dL — ABNORMAL HIGH (ref 6–20)
BUN: 24 mg/dL — ABNORMAL HIGH (ref 6–20)
CO2: 24 mmol/L (ref 22–32)
CO2: 22 mmol/L (ref 22–32)
Calcium: 8.9 mg/dL (ref 8.9–10.3)
Calcium: 9.3 mg/dL (ref 8.9–10.3)
Chloride: 106 mmol/L (ref 98–111)
Chloride: 103 mmol/L (ref 98–111)
Creatinine, Ser: 1.15 mg/dL — ABNORMAL HIGH (ref 0.44–1.00)
Creatinine, Ser: 1 mg/dL (ref 0.44–1.00)
GFR, Estimated: 55 mL/min — ABNORMAL LOW (ref >60)
GFR, Estimated: >60 mL/min (ref >60)
Glucose, Bld: 147 mg/dL — ABNORMAL HIGH (ref 70–99)
Glucose, Bld: 144 mg/dL — ABNORMAL HIGH (ref 70–99)
Phosphorus: 2.3 mg/dL — ABNORMAL LOW (ref 2.5–4.6)
Phosphorus: 3 mg/dL (ref 2.5–4.6)
Potassium: 3.2 mmol/L — ABNORMAL LOW (ref 3.5–5.1)
Potassium: 4 mmol/L (ref 3.5–5.1)
Sodium: 141 mmol/L (ref 135–145)
Sodium: 139 mmol/L (ref 135–145)

## 2024-09-12 LAB — BASIC METABOLIC PANEL WITH GFR
Anion gap: 13 (ref 5–15)
Anion gap: 9 (ref 5–15)
BUN: 23 mg/dL — ABNORMAL HIGH (ref 6–20)
BUN: 24 mg/dL — ABNORMAL HIGH (ref 6–20)
CO2: 23 mmol/L (ref 22–32)
CO2: 23 mmol/L (ref 22–32)
Calcium: 8.9 mg/dL (ref 8.9–10.3)
Calcium: 9 mg/dL (ref 8.9–10.3)
Chloride: 105 mmol/L (ref 98–111)
Chloride: 106 mmol/L (ref 98–111)
Creatinine, Ser: 1.01 mg/dL — ABNORMAL HIGH (ref 0.44–1.00)
Creatinine, Ser: 1.17 mg/dL — ABNORMAL HIGH (ref 0.44–1.00)
GFR, Estimated: >60 mL/min (ref >60)
GFR, Estimated: 53 mL/min — ABNORMAL LOW (ref >60)
Glucose, Bld: 163 mg/dL — ABNORMAL HIGH (ref 70–99)
Glucose, Bld: 135 mg/dL — ABNORMAL HIGH (ref 70–99)
Potassium: 3.1 mmol/L — ABNORMAL LOW (ref 3.5–5.1)
Potassium: 4.2 mmol/L (ref 3.5–5.1)
Sodium: 141 mmol/L (ref 135–145)
Sodium: 138 mmol/L (ref 135–145)

## 2024-09-12 LAB — CULTURE, BLOOD (ROUTINE X 2)
Culture: NO GROWTH
Culture: NO GROWTH

## 2024-09-12 LAB — PROTIME-INR
INR: 3.1 — ABNORMAL HIGH (ref 0.8–1.2)
Prothrombin Time: 33 s — ABNORMAL HIGH (ref 11.4–15.2)

## 2024-09-12 LAB — GLUCOSE, CAPILLARY
Glucose-Capillary: 128 mg/dL — ABNORMAL HIGH (ref 70–99)
Glucose-Capillary: 129 mg/dL — ABNORMAL HIGH (ref 70–99)
Glucose-Capillary: 131 mg/dL — ABNORMAL HIGH (ref 70–99)
Glucose-Capillary: 133 mg/dL — ABNORMAL HIGH (ref 70–99)
Glucose-Capillary: 137 mg/dL — ABNORMAL HIGH (ref 70–99)
Glucose-Capillary: 138 mg/dL — ABNORMAL HIGH (ref 70–99)

## 2024-09-12 LAB — NEURON-SPECIFIC ENOLASE(NSE), BLOOD: Neuron-specific Enolase, Serum: 13.9 ng/mL (ref 0.0–17.6)

## 2024-09-12 LAB — MAGNESIUM
Magnesium: 2.2 mg/dL (ref 1.7–2.4)
Magnesium: 2.4 mg/dL (ref 1.7–2.4)
Magnesium: 2.5 mg/dL — ABNORMAL HIGH (ref 1.7–2.4)

## 2024-09-12 LAB — HEPATIC FUNCTION PANEL
ALT: 50 U/L — ABNORMAL HIGH (ref 0–44)
AST: 77 U/L — ABNORMAL HIGH (ref 15–41)
Albumin: 4.4 g/dL (ref 3.5–5.0)
Alkaline Phosphatase: 157 U/L — ABNORMAL HIGH (ref 38–126)
Bilirubin, Direct: 5.3 mg/dL — ABNORMAL HIGH (ref 0.0–0.2)
Indirect Bilirubin: 7.7 mg/dL — ABNORMAL HIGH (ref 0.3–0.9)
Total Bilirubin: 13 mg/dL — ABNORMAL HIGH (ref 0.0–1.2)
Total Protein: 7 g/dL (ref 6.5–8.1)

## 2024-09-12 LAB — CALCIUM, IONIZED
Calcium, Ionized, Serum: 4.7 mg/dL (ref 4.5–5.6)
Calcium, Ionized, Serum: 5 mg/dL (ref 4.5–5.6)
Calcium, Ionized, Serum: 4.9 mg/dL (ref 4.5–5.6)

## 2024-09-12 MED ORDER — LACTULOSE 10 GM/15ML PO SOLN
20.0000 g | Freq: Three times a day (TID) | ORAL | Status: DC
  Administered 2024-09-12 – 2024-09-17 (×17): 20 g
  Filled 2024-09-12 (×17): qty 30

## 2024-09-12 MED ORDER — POTASSIUM PHOSPHATES 15 MMOLE/5ML IV SOLN
15.0000 mmol | Freq: Once | INTRAVENOUS | Status: AC
  Administered 2024-09-12: 15 mmol via INTRAVENOUS
  Filled 2024-09-12: qty 5

## 2024-09-12 MED ORDER — POTASSIUM CHLORIDE 10 MEQ/50ML IV SOLN
10.0000 meq | INTRAVENOUS | Status: AC
  Administered 2024-09-12 (×4): 10 meq via INTRAVENOUS
  Filled 2024-09-12 (×4): qty 50

## 2024-09-12 MED ORDER — DILTIAZEM LOAD VIA INFUSION
10.0000 mg | Freq: Once | INTRAVENOUS | Status: AC
  Administered 2024-09-12: 10 mg via INTRAVENOUS
  Filled 2024-09-12: qty 10

## 2024-09-12 MED ORDER — SODIUM CHLORIDE 0.9 % IV SOLN: 1.0000 g | Freq: Two times a day (BID) | INTRAVENOUS | Status: DC

## 2024-09-12 MED ORDER — PANTOPRAZOLE SODIUM 40 MG IV SOLR
40.0000 mg | Freq: Two times a day (BID) | INTRAVENOUS | Status: DC
Start: 2024-09-12 — End: 2024-09-15
  Administered 2024-09-12 – 2024-09-15 (×6): 40 mg via INTRAVENOUS
  Filled 2024-09-12 (×6): qty 10

## 2024-09-12 MED ORDER — SODIUM CHLORIDE 0.9 % IV SOLN
1.0000 g | Freq: Three times a day (TID) | INTRAVENOUS | Status: DC
  Administered 2024-09-12 (×2): 1 g via INTRAVENOUS
  Filled 2024-09-12 (×2): qty 20

## 2024-09-12 MED ORDER — DILTIAZEM HCL-DEXTROSE 125-5 MG/125ML-% IV SOLN (PREMIX)
5.0000 mg/h | INTRAVENOUS | Status: DC
  Administered 2024-09-12: 5 mg/h via INTRAVENOUS
  Filled 2024-09-12: qty 125

## 2024-09-12 MED ORDER — DEXMEDETOMIDINE HCL IN NACL 400 MCG/100ML IV SOLN
0.0000 ug/kg/h | INTRAVENOUS | Status: DC
  Administered 2024-09-12: 0.4 ug/kg/h via INTRAVENOUS
  Filled 2024-09-12: qty 100

## 2024-09-12 MED ORDER — STERILE WATER FOR INJECTION IJ SOLN
INTRAMUSCULAR | Status: AC
  Filled 2024-09-12: qty 10

## 2024-09-12 MED ORDER — SODIUM CHLORIDE 0.9 % IV SOLN
1.0000 g | Freq: Three times a day (TID) | INTRAVENOUS | Status: DC
  Administered 2024-09-12 – 2024-09-13 (×2): 1 g via INTRAVENOUS
  Filled 2024-09-12 (×2): qty 20

## 2024-09-12 MED ORDER — OLANZAPINE 10 MG IM SOLR
2.5000 mg | Freq: Once | INTRAMUSCULAR | Status: DC | PRN
  Filled 2024-09-12: qty 10

## 2024-09-12 NOTE — Progress Notes (Signed)
 Admit: 09/03/2024 LOS: 9  29F anuric AKI cardiac arrest, septic shock, GNR bacteremia, decompensated cirrhosis, ARDS  Current CRRT Prescription: Start Date: 9/17 Catheter: Nontunneled IJ BFR: 300 Pre Blood Pump: 300 400 4K DFR: 1200 4K Replacement Rate: 400 4K Goal UF: -50 mL/h Anticoagulation: None Clotting: Infrequent  S: Seen on hemodialysis, visitors at bedside Some A-fib with RVR overnight requiring diltiazem  now stopped Now on norepinephrine  this morning -2.5 L yesterday A.m. labs with K 4.2, P 2.3   O: 09/23 0701 - 09/24 0700 In: 2281 [I.V.:116.8; NG/GT:1100; IV Piggyback:1064.2] Out: 4861.6 [Stool:1530]  Filed Weights   09/10/24 0500 09/11/24 0500 09/12/24 0500  Weight: 54.5 kg 52.7 kg 48 kg    Recent Labs  Lab 09/11/24 0335 09/11/24 0339 09/11/24 1556 09/12/24 0333 09/12/24 0808  NA 141  141   < > 141 141 138  K 3.1*  3.1*   < > 3.6 3.2* 4.2  CL 103  105  --  105 106 106  CO2 23  23  --  22 24 23   GLUCOSE 150*  151*  --  202* 147* 135*  BUN 20  20  --  23* 24* 24*  CREATININE 1.03*  1.10*  --  1.10* 1.15* 1.17*  CALCIUM  9.0  9.0  --  8.9 8.9 9.0  PHOS 1.8*  --  3.7 2.3*  --    < > = values in this interval not displayed.   Recent Labs  Lab 09/09/24 0941 09/10/24 0408 09/10/24 0916 09/11/24 0335 09/11/24 0339 09/12/24 0333  WBC 18.2* 22.5*  --  28.3*  --  35.2*  NEUTROABS 16.2*  --   --   --   --   --   HGB 7.2* 7.7*   < > 8.2* 8.8* 9.5*  HCT 21.1* 22.6*   < > 23.6* 26.0* 27.0*  MCV 101.4* 100.9*  --  100.4*  --  100.4*  PLT 24* 45*  --  29*  --  38*   < > = values in this interval not displayed.    Scheduled Meds:  Chlorhexidine  Gluconate Cloth  6 each Topical Once per day on Monday Tuesday Wednesday Thursday Friday Saturday   feeding supplement (PROSource TF20)  60 mL Per Tube BID   folic acid   1 mg Per Tube Daily   insulin  aspart  0-9 Units Subcutaneous Q4H   lactulose   30 g Per Tube TID   levothyroxine   88 mcg Per Tube QAC  breakfast   multivitamin  1 tablet Per Tube QHS   ofloxacin   5 drop Both EARS Daily   mouth rinse  15 mL Mouth Rinse Q2H   pantoprazole  (PROTONIX ) IV  40 mg Intravenous Q24H   sodium chloride  flush  10-40 mL Intracatheter Q12H   thiamine   100 mg Per Tube Daily   Continuous Infusions:  dexmedetomidine  (PRECEDEX ) IV infusion Stopped (09/12/24 0645)   diltiazem  (CARDIZEM ) infusion Stopped (09/12/24 0409)   feeding supplement (VITAL 1.5 CAL) 40 mL/hr at 09/12/24 1200   meropenem  (MERREM ) IV Stopped (09/12/24 9046)   norepinephrine  (LEVOPHED ) Adult infusion 1 mcg/min (09/12/24 1200)   potassium PHOSPHATE  IVPB (in mmol) 43 mL/hr at 09/12/24 1200   prismasol  BGK 4/2.5 1,200 mL/hr at 09/12/24 9177   prismasol  BGK 4/2.5 400 mL/hr at 09/12/24 0817   prismasol  BGK 4/2.5 400 mL/hr at 09/12/24 0839   PRN Meds:.artificial tears, diphenhydrAMINE , melatonin, ondansetron  (ZOFRAN ) IV, mouth rinse, pneumococcal 20-valent conjugate vaccine, sodium chloride  flush  ABG    Component  Value Date/Time   PHART 7.469 (H) 09/11/2024 0339   PCO2ART 36.3 09/11/2024 0339   PO2ART 110 (H) 09/11/2024 0339   HCO3 26.4 09/11/2024 0339   TCO2 27 09/11/2024 0339   ACIDBASEDEF 1.0 09/07/2024 0500   O2SAT 99 09/11/2024 0339   Chronically ill-appearing, jaundiced On ventilator, ET tube present Coarse breath sounds bilaterally Regular, normal S1 and S2 1-2+ lower extremity edema  A/P  Dialysis dependent anuric AKI from ATN on CRRT Cardiac arrest PEA 9/16 Septic shock with previous GNR bacteremia on/off vasopressors, per CCM AHRF/ARDS per CCM Decompensated alcoholic cirrhosis with portal hypertension, variceal disease  Coagulopathy, thrombocytopenia, hematology following, DIC favored, TTP unlikely Hypophosphatemia related to CRRT Hypokalemia on 4K dialysate Anemia  Continue CRRT at current settings with fluid removal for another 24 hours Replete phosphorus and potassium as needed  Bernardino Gasman, MD [employer]

## 2024-09-12 NOTE — Procedures (Signed)
 Extubation Procedure Note  Patient Details:   Name: Rachel Lucas DOB: September 28, 1961 MRN: [id number]   Airway Documentation:    Vent end date: 09/12/24 Vent end time: 1103   Evaluation  O2 sats: stable throughout Complications: No apparent complications Patient did tolerate procedure well. Bilateral Breath Sounds: Clear   Yes  Positive cuff leak noted. Patient placed on [place] 4L with humidity, no stridor noted. Patient able to reach 625 mL using the incentive spirometer.  Cy RAMAN Kinsley Holderman 09/12/2024, 11:10 AM

## 2024-09-12 NOTE — Progress Notes (Signed)
 Phos came back 2.3 this AM. Ok to give an additional per Dr. Marlee.  Sergio Batch, PharmD, BCIDP, AAHIVP, CPP Infectious Disease Pharmacist 09/12/2024 11:12 AM

## 2024-09-12 NOTE — Progress Notes (Signed)
 PHARMACY NOTE:  ANTIMICROBIAL RENAL DOSAGE ADJUSTMENT  Current antimicrobial regimen includes a mismatch between antimicrobial dosage and estimated renal function.  As per policy approved by the Pharmacy & Therapeutics and Medical Executive Committees, the antimicrobial dosage will be adjusted accordingly.  Current antimicrobial dosage:  Merrem  1g IV q8  Indication: Bacteremia  Renal Function:  Estimated Creatinine Clearance: 38.7 mL/min (A) (by C-G formula based on SCr of 1.17 mg/dL (H)). []      On intermittent HD, scheduled: []      On CRRT    Antimicrobial dosage has been changed to:  Merrem  1g IV q12  Additional comments:   Sergio Batch, PharmD, BCIDP, AAHIVP, CPP Infectious Disease Pharmacist 09/12/2024 2:03 PM

## 2024-09-12 NOTE — Progress Notes (Signed)
 Mag and BMP resulted under incorrect time. Labs reflecting a collection time of 0001 9/23. Actual collection time 0001 9/24. MD aware

## 2024-09-12 NOTE — Progress Notes (Signed)
 PHARMACY NOTE:  ANTIMICROBIAL RENAL DOSAGE ADJUSTMENT  Current antimicrobial regimen includes a mismatch between antimicrobial dosage and estimated renal function.  As per policy approved by the Pharmacy & Therapeutics and Medical Executive Committees, the antimicrobial dosage will be adjusted accordingly.  Current antimicrobial dosage:  Meropenem  1g q12h   Indication: GNR bacteremia   Renal Function:  Estimated Creatinine Clearance: 38.7 mL/min (A) (by C-G formula based on SCr of 1.17 mg/dL (H)). []      On intermittent HD, scheduled: [x]      On CRRT    Antimicrobial dosage has been changed to:  Meropenem  1g q8h   Additional comments: Will adjust to meropenem  1g q8h for now given patient still on CRRT. Appears tentative plans per nephrology note is to continue for another 24 hours. Will continue to monitor and adjust dose as appropriate.   Feliciano Close, PharmD PGY2 Infectious Diseases Pharmacy Resident  09/12/2024 3:42 PM

## 2024-09-12 NOTE — Progress Notes (Signed)
 [facility]  Date of Admission:  09/03/2024      Lines: 9/17-c right internal jugular hd catheter 9/15-c right femoral triple lumen 9/15-c right femoral arterial line  9/16-c urethral catheter  Other: 9/15 - c stress dose hydrocortisone   Abx: 9/21-c ceftriaxone  9/21-c metronidazole   9/15-21 piptazo 9/15-19 vanc 9/15-22 rifaximin   ASSESSMENT: 63 yo female newly dx'ed alcoholic cirrhosis (presuming all alcohol), recent admission 07/2024 for klebsiella bacteremia, readmitted 09/03/24 with septic shock, in setting of acute 1 day left leg swelling/redness and geographic bullae/erythematous macule  9/15 bcx gnr on anaerobic bottle only not able to result by bcid  9/22 the left leg had vascular surgery input and no acute vascular occlusion determinted. The rash had fairly looked the same. There are purpura on the arms. She remains with multiorgan failure (aki requiring crrt), septic shock on pressor, and with dic like pictures vs decompensated cirrhosis   Clinically she doesn't behave like sjs, which primary team had thought of. I am concerned this is all rather related to decompensated cirrhosis with hrs1. Renal is also following along   She has 7 days of abx already and we plan 10 days of abx treatment total pending further sensitivity testing for the gnr    Reviewed other data: Temporal bone imaging mastoid effusion without bony abnormality incidental and unclear significance. On abx and that would cover acute mastoiditis but I do not have reason to believe she has mastoiditis  ----------------- 09/11/24 assessment Off pressors Pulm/ccm hopeful for extubation Rash lle blister coming off on some, otherwise stable; upper ext purpuric (discussed with nursing staff who has been patient for at least the last 2 days  Wbc up Anemic/thrombocytopenic still  Hydrocortisone  being tapered off   9/24 id assessment Extubated  No complaint of  pain or sensory deficit in the legs Appears to mentate well Wbc still rising  Gnr culture sent out to [facility] for identification  Concern we are missing the gnr coverage and that she might not be more horifically ill due to cirrhosis Remains on crrt  No sign of cdiff   PLAN: Transition abx to meropenem  F/u bcx report from [facility] Supportive care per renal/pulm ccm Maintain standard isolation precaution Discussed with primary team    Principal Problem:   Septic shock (HCC) Active Problems:   Hepatorenal syndrome (HCC)   AKI (acute kidney injury)   Allergies  Allergen Reactions   Levofloxacin Other (See Comments)   Sulfonamide Derivatives Palpitations    Scheduled Meds:  Chlorhexidine  Gluconate Cloth  6 each Topical Once per day on Monday Tuesday Wednesday Thursday Friday Saturday   feeding supplement (PROSource TF20)  60 mL Per Tube BID   folic acid   1 mg Per Tube Daily   insulin  aspart  0-9 Units Subcutaneous Q4H   lactulose   20 g Per Tube TID   levothyroxine   88 mcg Per Tube QAC breakfast   multivitamin  1 tablet Per Tube QHS   ofloxacin   5 drop Both EARS Daily   mouth rinse  15 mL Mouth Rinse Q2H   pantoprazole  (PROTONIX ) IV  40 mg Intravenous Q12H   sodium chloride  flush  10-40 mL Intracatheter Q12H   sterile water  (preservative free)       thiamine   100 mg Per Tube Daily   Continuous Infusions:  diltiazem  (CARDIZEM ) infusion Stopped (09/12/24 0409)   feeding supplement (VITAL 1.5 CAL) 40 mL/hr at 09/12/24 2200   meropenem  (MERREM ) IV Stopped (  09/12/24 2142)   norepinephrine  (LEVOPHED ) Adult infusion 1 mcg/min (09/12/24 2200)   prismasol  BGK 4/2.5 1,200 mL/hr at 09/12/24 2126   prismasol  BGK 4/2.5 400 mL/hr at 09/12/24 2126   prismasol  BGK 4/2.5 400 mL/hr at 09/12/24 2126   PRN Meds:.artificial tears, diphenhydrAMINE , melatonin, OLANZapine , ondansetron  (ZOFRAN ) IV, mouth rinse, pneumococcal 20-valent conjugate vaccine, sodium chloride  flush, sterile water   (preservative free)   SUBJECTIVE: Wbc continues to climb Hds Extubated No complaint  Review of Systems: ROS All other ROS was negative, except mentioned above     OBJECTIVE: Vitals:   09/12/24 1830 09/12/24 1845 09/12/24 1900 09/12/24 1937  BP:      Pulse: (!) 115 (!) 117 (!) 114   Resp: 12 18 18    Temp:    (!) 97.4 F (36.3 C)  TempSrc:    Axillary  SpO2: 100% 100% 100%   Weight:      Height:       Body mass index is 18.75 kg/m.  Physical Exam General/constitutional: chronically ill appearing, conversant, apperas to be oriented and alert/cooperative HEENT: jaundice CV: tachy, no mrg Lungs: clear on vent Abd: Soft Ext: no LE edema; left leg wrapped and reviewed picture Skin: see pictures Neuro: sedated/intubated MSK: no peripheral joint swelling   Central line presence: right internal jugular hd line, and right groin a line and central line site no purulence   Lab Results Lab Results  Component Value Date   WBC 35.2 (H) 09/12/2024   HGB 9.5 (L) 09/12/2024   HCT 27.0 (L) 09/12/2024   MCV 100.4 (H) 09/12/2024   PLT 38 (L) 09/12/2024    Lab Results  Component Value Date   CREATININE 1.00 09/12/2024   BUN 24 (H) 09/12/2024   NA 139 09/12/2024   K 4.0 09/12/2024   CL 103 09/12/2024   CO2 22 09/12/2024    Lab Results  Component Value Date   ALT 50 (H) 09/12/2024   AST 77 (H) 09/12/2024   ALKPHOS 157 (H) 09/12/2024   BILITOT 13.0 (H) 09/12/2024      Microbiology: Recent Results (from the past 240 hours)  MRSA Next Gen by PCR, Nasal     Status: None   Collection Time: 09/03/24  4:45 PM   Specimen: Nasal Mucosa; Nasal Swab  Result Value Ref Range Status   MRSA by PCR Next Gen NOT DETECTED NOT DETECTED Final    Comment: (NOTE) The GeneXpert MRSA Assay (FDA approved for NASAL specimens only), is one component of a comprehensive MRSA colonization surveillance program. It is not intended to diagnose MRSA infection nor to guide or monitor  treatment for MRSA infections. Test performance is not FDA approved in patients less than 89 years old. Performed at [facility], [address] [place], [place] [address]   Blood culture (routine x 2)     Status: None   Collection Time: 09/03/24  9:30 PM   Specimen: BLOOD  Result Value Ref Range Status   Specimen Description BLOOD SITE NOT SPECIFIED  Final   Special Requests   Final    BOTTLES DRAWN AEROBIC AND ANAEROBIC Blood Culture results may not be optimal due to an inadequate volume of blood received in culture bottles   Culture  Setup Time   Final    GRAM NEGATIVE RODS ANAEROBIC BOTTLE ONLY CRITICAL VALUE NOTED.  VALUE IS CONSISTENT WITH PREVIOUSLY REPORTED AND CALLED VALUE.    Culture   Final    GRAM NEGATIVE RODS PREVIOS CULTURE SENT TO  [facility] FOR ID AND SESNI Performed at [facility], [address], [place], [place] [address]    Report Status 09/12/2024 FINAL  Final  Blood culture (routine x 2)     Status: None (Preliminary result)   Collection Time: 09/03/24  9:30 PM   Specimen: BLOOD  Result Value Ref Range Status   Specimen Description BLOOD SITE NOT SPECIFIED  Final   Special Requests   Final    BOTTLES DRAWN AEROBIC AND ANAEROBIC Blood Culture results may not be optimal due to an inadequate volume of blood received in culture bottles   Culture  Setup Time   Final    GRAM NEGATIVE RODS ANAEROBIC BOTTLE ONLY CRITICAL RESULT CALLED TO, READ BACK BY AND VERIFIED WITH: PHARMD BLAKE WANNARAT ON 09/05/24 @ 1525 BY DRT    Culture   Final    GRAM NEGATIVE RODS Sent to [facility] for further susceptibility testing. Performed at [facility], [address] [place], [place] [address]    Report Status PENDING  Incomplete  Blood Culture ID Panel (Reflexed)     Status: None   Collection Time: 09/03/24  9:30 PM  Result Value Ref Range Status   Enterococcus faecalis NOT DETECTED NOT DETECTED Final   Enterococcus Faecium NOT DETECTED NOT DETECTED Final    Listeria monocytogenes NOT DETECTED NOT DETECTED Final   Staphylococcus species NOT DETECTED NOT DETECTED Final   Staphylococcus aureus (BCID) NOT DETECTED NOT DETECTED Final   Staphylococcus epidermidis NOT DETECTED NOT DETECTED Final   Staphylococcus lugdunensis NOT DETECTED NOT DETECTED Final   Streptococcus species NOT DETECTED NOT DETECTED Final   Streptococcus agalactiae NOT DETECTED NOT DETECTED Final   Streptococcus pneumoniae NOT DETECTED NOT DETECTED Final   Streptococcus pyogenes NOT DETECTED NOT DETECTED Final   A.calcoaceticus-baumannii NOT DETECTED NOT DETECTED Final   Bacteroides fragilis NOT DETECTED NOT DETECTED Final   Enterobacterales NOT DETECTED NOT DETECTED Final   Enterobacter cloacae complex NOT DETECTED NOT DETECTED Final   Escherichia coli NOT DETECTED NOT DETECTED Final   Klebsiella aerogenes NOT DETECTED NOT DETECTED Final   Klebsiella oxytoca NOT DETECTED NOT DETECTED Final   Klebsiella pneumoniae NOT DETECTED NOT DETECTED Final   Proteus species NOT DETECTED NOT DETECTED Final   Salmonella species NOT DETECTED NOT DETECTED Final   Serratia marcescens NOT DETECTED NOT DETECTED Final   Haemophilus influenzae NOT DETECTED NOT DETECTED Final   Neisseria meningitidis NOT DETECTED NOT DETECTED Final   Pseudomonas aeruginosa NOT DETECTED NOT DETECTED Final   Stenotrophomonas maltophilia NOT DETECTED NOT DETECTED Final   Candida albicans NOT DETECTED NOT DETECTED Final   Candida auris NOT DETECTED NOT DETECTED Final   Candida glabrata NOT DETECTED NOT DETECTED Final   Candida krusei NOT DETECTED NOT DETECTED Final   Candida parapsilosis NOT DETECTED NOT DETECTED Final   Candida tropicalis NOT DETECTED NOT DETECTED Final   Cryptococcus neoformans/gattii NOT DETECTED NOT DETECTED Final    Comment: Performed at [facility], [address], [place], [place] [address]  Respiratory (~20 pathogens) panel by PCR     Status: None   Collection Time: 09/03/24  9:40  PM   Specimen: Line, Central; Respiratory  Result Value Ref Range Status   Adenovirus NOT DETECTED NOT DETECTED Final   Coronavirus 229E NOT DETECTED NOT DETECTED Final    Comment: (NOTE) The Coronavirus on the Respiratory Panel, DOES NOT test for the novel  Coronavirus (2019 nCoV)    Coronavirus HKU1 NOT DETECTED  NOT DETECTED Final   Coronavirus NL63 NOT DETECTED NOT DETECTED Final   Coronavirus OC43 NOT DETECTED NOT DETECTED Final   Metapneumovirus NOT DETECTED NOT DETECTED Final   Rhinovirus / Enterovirus NOT DETECTED NOT DETECTED Final   Influenza A NOT DETECTED NOT DETECTED Final   Influenza B NOT DETECTED NOT DETECTED Final   Parainfluenza Virus 1 NOT DETECTED NOT DETECTED Final   Parainfluenza Virus 2 NOT DETECTED NOT DETECTED Final   Parainfluenza Virus 3 NOT DETECTED NOT DETECTED Final   Parainfluenza Virus 4 NOT DETECTED NOT DETECTED Final   Respiratory Syncytial Virus NOT DETECTED NOT DETECTED Final   Bordetella pertussis NOT DETECTED NOT DETECTED Final   Bordetella Parapertussis NOT DETECTED NOT DETECTED Final   Chlamydophila pneumoniae NOT DETECTED NOT DETECTED Final   Mycoplasma pneumoniae NOT DETECTED NOT DETECTED Final    Comment: Performed at [facility], [address] [place], [place] [address]  Resp panel by RT-PCR (RSV, Flu A&B, Covid) Line, Central     Status: None   Collection Time: 09/03/24  9:40 PM   Specimen: Line, Central; Nasal Swab  Result Value Ref Range Status   SARS Coronavirus 2 by RT PCR NEGATIVE NEGATIVE Final   Influenza A by PCR NEGATIVE NEGATIVE Final   Influenza B by PCR NEGATIVE NEGATIVE Final    Comment: (NOTE) The Xpert Xpress SARS-CoV-2/FLU/RSV plus assay is intended as an aid in the diagnosis of influenza from Nasopharyngeal swab specimens and should not be used as a sole basis for treatment. Nasal washings and aspirates are unacceptable for Xpert Xpress SARS-CoV-2/FLU/RSV testing.  Fact Sheet for  Patients: BloggerCourse.com  Fact Sheet for Healthcare Providers: SeriousBroker.it  This test is not yet approved or cleared by the [place]  FDA and has been authorized for detection and/or diagnosis of SARS-CoV-2 by FDA under an Emergency Use Authorization (EUA). This EUA will remain in effect (meaning this test can be used) for the duration of the COVID-19 declaration under Section 564(b)(1) of the Act, 21 U.S.C. section 360bbb-3(b)(1), unless the authorization is terminated or revoked.     Resp Syncytial Virus by PCR NEGATIVE NEGATIVE Final    Comment: (NOTE) Fact Sheet for Patients: BloggerCourse.com  Fact Sheet for Healthcare Providers: SeriousBroker.it  This test is not yet approved or cleared by the [place]  FDA and has been authorized for detection and/or diagnosis of SARS-CoV-2 by FDA under an Emergency Use Authorization (EUA). This EUA will remain in effect (meaning this test can be used) for the duration of the COVID-19 declaration under Section 564(b)(1) of the Act, 21 U.S.C. section 360bbb-3(b)(1), unless the authorization is terminated or revoked.  Performed at [facility], [address], [place], [place] [address]   Body fluid culture w Gram Stain     Status: None   Collection Time: 09/06/24  4:28 PM   Specimen: Peritoneal Washings; Pleural Fluid  Result Value Ref Range Status   Specimen Description PERITONEAL  Final   Special Requests NONE  Final   Gram Stain NO WBC SEEN NO ORGANISMS SEEN   Final   Culture   Final    NO GROWTH 3 DAYS Performed at [facility], [address], [place], [place] [address]    Report Status 09/10/2024 FINAL  Final  Fungus Culture With Stain     Status: None (Preliminary result)   Collection Time: 09/06/24  5:00 PM   Specimen: Pleural Fluid  Result Value Ref Range Status   Fungus Stain  Final report   Final    Comment: (NOTE) Performed At: [facility] [address] [place], [place] [id number] Jennette Shorter MD Ey:[id number]    Fungus (Mycology) Culture PENDING  Incomplete   Fungal Source PERITONEAL  Final    Comment: Performed at [facility], [address], [place], [place] [address]  Fungus Culture Result     Status: None   Collection Time: 09/06/24  5:00 PM  Result Value Ref Range Status   Result 1 Comment  Final    Comment: (NOTE) KOH/Calcofluor preparation:  no fungus observed. Performed At: [facility] [address] [place], [place] [id number] Jennette Shorter MD Ey:[id number]   Culture, blood (Routine X 2) w Reflex to ID Panel     Status: None   Collection Time: 09/07/24  4:13 PM   Specimen: BLOOD  Result Value Ref Range Status   Specimen Description BLOOD FOOT  Final   Special Requests   Final    AEROBIC BOTTLE ONLY Blood Culture results may not be optimal due to an inadequate volume of blood received in culture bottles   Culture   Final    NO GROWTH 5 DAYS Performed at [facility], [address], [place], [place] [address]    Report Status 09/12/2024 FINAL  Final  Culture, blood (Routine X 2) w Reflex to ID Panel     Status: None   Collection Time: 09/07/24  4:13 PM   Specimen: BLOOD  Result Value Ref Range Status   Specimen Description BLOOD FOOT  Final   Special Requests   Final    AEROBIC BOTTLE ONLY Blood Culture results may not be optimal due to an inadequate volume of blood received in culture bottles   Culture   Final    NO GROWTH 5 DAYS Performed at [facility], [address], [place], [place] [address]    Report Status 09/12/2024 FINAL  Final     Serology:   Imaging: If present, new imagings (plain films, ct scans, and mri) have been personally visualized and interpreted; radiology reports have been reviewed. Decision making incorporated into the Impression / Recommendations.  9/22 cxr 1. Significant  decrease in diffuse bilateral interstitial opacities. 2. Persistant dense left retrocardiac opacity. 3. Small left pleural effusion.  9/20 ct abd pelv 1. No acute inflammatory process identified within the abdomen or pelvis. 2. There is cirrhotic liver configuration. There is moderate ascites. No walled-off abscess or pneumoperitoneum. 3. There is small left and small-to-moderate right pleural effusion with associated compressive atelectatic changes in the bilateral lower lobes. There are additional diffuse patchy ground-glass opacities in the middle lobe and bilateral lower lobes. Findings are nonspecific but new since the prior study. Differential diagnosis includes multilobar pneumonia, pulmonary edema, ARDS, atypical pneumonia, hypersensitivity pneumonitis, etc. 4. Multiple other nonacute observations, as described above   9/20 ct temporal bones 1. Improved aeration of the left middle ear cavity and unchanged left mastoid effusion. No bone erosion. 2. New small right mastoid effusion.   9/15 ct left lower ext No acute fracture or erosive changes are noted.   Considerable subcutaneous edema without definitive abscess.   Incidental note is made of fatty infiltration of the medial head of the gastrocnemius.   Constance ONEIDA Passer, MD [facility] [phone] pager    09/12/2024, 11:27 PM

## 2024-09-12 NOTE — Progress Notes (Signed)
 eLink Physician-Brief Progress Note Patient Name: HAJA CREGO DOB: [birth year] MRN: [id number]   Date of Service  09/12/2024  HPI/Events of Note  Afib with RVR persisting despite Digoxin  loading, BP somewhat improved currently so I will start Diltiazem  gtt without a bolus.  eICU Interventions  Diltiazem  gtt ordered.        Gertie Broerman U Jaysin Gayler 09/12/2024, 12:35 AM

## 2024-09-12 NOTE — Evaluation (Signed)
 Clinical/Bedside Swallow Evaluation Patient Details  Name: Rachel Lucas MRN: [id number] Date of Birth: 01-20-1961  Today's Date: 09/12/2024 Time: SLP Start Time (ACUTE ONLY): 1628 SLP Stop Time (ACUTE ONLY): 1640 SLP Time Calculation (min) (ACUTE ONLY): 12 min  Past Medical History:  Past Medical History:  Diagnosis Date   Hypothyroidism    Thyroid  disease    Von Willebrand disease (HCC)    Past Surgical History:  Past Surgical History:  Procedure Laterality Date   CYST REMOVAL NECK  1995   IR PARACENTESIS  09/06/2024   HPI:  Patient is a 63 y.o. female with PMH: recent diagnosis of alcoholic cirrhosis with septic shock thought to be secondary to cellulitis. She presented to the hospital on 09/03/24 due to concern for single episode of melena  the day prior to admission but subsequently with normal stools. In ED, patient was unresponsive to fluids with BP 60/30's. She went into PEA arrest on 9/16 for 6 minutes with ROSC, NG tube in place. She was intubated after PEA and ultimately extubated on 9/24. She failed [facility] with RN and SLP swallow evaluation ordered.    Assessment / Plan / Recommendation  Clinical Impression  Patient is presenting with clinical s/s of a post extubation dysphagia following a 9 day intubation. She is completely aphonic. Oral mucosa was dry but clear of secretions. SLP performed oral care followed by trial of small, single ice chips. Patient exhibited decreased hyolaryngeal elevation and although initially she exhibited immediate coughing, subsequent ice chips resulted in more delayed cough response. She is not safe for PO's at this time but SLP is recommending to allow small amount of ice chips periodically throughout the day with full supervision from RN only, at RN's discretion. SLP will follow for PO readiness and anticipate she will need an objective swallow study prior to initiating PO's. SLP Visit Diagnosis: Dysphagia, unspecified (R13.10)    Aspiration  Risk  Severe aspiration risk;Risk for inadequate nutrition/hydration    Diet Recommendation NPO    Medication Administration: Via alternative means    Other  Recommendations Oral Care Recommendations: Oral care QID;Staff/trained caregiver to provide oral care     Assistance Recommended at Discharge    Functional Status Assessment Patient has had a recent decline in their functional status and demonstrates the ability to make significant improvements in function in a reasonable and predictable amount of time.  Frequency and Duration min 2x/week  2 weeks       Prognosis Prognosis for improved oropharyngeal function: Fair Barriers to Reach Goals: Severity of deficits      Swallow Study   General Date of Onset: 09/12/24 HPI: Patient is a 63 y.o. female with PMH: recent diagnosis of alcoholic cirrhosis with septic shock thought to be secondary to cellulitis. She presented to the hospital on 09/03/24 due to concern for single episode of melena  the day prior to admission but subsequently with normal stools. In ED, patient was unresponsive to fluids with BP 60/30's. She went into PEA arrest on 9/16 for 6 minutes with ROSC, NG tube in place. She was intubated after PEA and ultimately extubated on 9/24. She failed [facility] with RN and SLP swallow evaluation ordered. Type of Study: Bedside Swallow Evaluation Previous Swallow Assessment: none found Diet Prior to this Study: NPO Temperature Spikes Noted: No Respiratory Status: Nasal cannula History of Recent Intubation: Yes Total duration of intubation (days): 9 days Date extubated: 09/12/24 Behavior/Cognition: Alert;Cooperative;Pleasant mood;Lethargic/Drowsy Oral Cavity Assessment: Dry Oral Care Completed by SLP: Yes  Oral Cavity - Dentition: Adequate natural dentition Self-Feeding Abilities: Needs assist;Needs set up;Total assist Patient Positioning: Upright in bed Baseline Vocal Quality: Aphonic Volitional Cough: Weak Volitional Swallow:  Able to elicit    Oral/Motor/Sensory Function Overall Oral Motor/Sensory Function: Within functional limits   Ice Chips Ice chips: Impaired Presentation: Spoon Pharyngeal Phase Impairments: Cough - Delayed;Decreased hyoid-laryngeal movement;Cough - Immediate   Thin Liquid Thin Liquid: Not tested    Nectar Thick Nectar Thick Liquid: Not tested   Honey Thick Honey Thick Liquid: Not tested   Puree Puree: Not tested   Solid     Solid: Not tested     Norleen IVAR Blase, MA, CCC-SLP Speech Therapy

## 2024-09-12 NOTE — Progress Notes (Signed)
 NAME:  Rachel Lucas, MRN:  [id number], DOB:  07/13/1961, LOS: 9 ADMISSION DATE:  09/03/2024, CONSULTATION DATE:  09/05/2024 REFERRING MD:  EDP, CHIEF COMPLAINT:  Left leg pain   History of Present Illness:  Patient is a 63 yo female with new diagnosis of alcoholic cirrhosis with septic shock thought 2/2 LLE cellulitis, does have persistent otitis media from recent prior admission for Klebsiella pneumonia and bacteremia.    Patient had concern for single episode of melena the day prior to admission, but subsequently had normal stools.  Had no cough, congestion, or hematemesis.  Did endorse worsening LLE swelling and redness with some pain the day of admission.   Recently, patient was admitted for Klebciella bacteremia and pneumonia in setting of accidental ear canal perforation with Q-tip (8/20-8/27).   In the ED, patient was unresponsive to fluids with BP 60/30s and PCCM was consulted for admission.  Patient stated that her baseline SBP is in the 90s.   Pertinent  Medical History  Fam hx of VWD but test 8/25 neg Alcoholic cirrhosis recent new diagnosis Sober for 1 month per self and husband   Significant Hospital Events: Including procedures, antibiotic start and stop dates in addition to other pertinent events   9/15 - Admitted, working dx LE cellulitis and SBP in setting of nex dx of ETOH related cirrhosis.  started Zosyn  (9/15-) and linezolid  (9/15-), GI consulted with no concern for GIB, CVC and Art line placed in PM 9/16 - Worsening delirium, PEA arrest for 6 minutes with ROSC, NG tube in place 9/17 - EEG neg for seizure. ECHO: EF 35-40%. Became hyperglycemic after switch to D10 fluids. Had minimal urine output at 50 mL. Nephro consulted for CRRT. HD cath placed.  CRRT started. IR consulted. Felt not stable for para. Stating: coagulopathy, shock and hypothermia as factors. 9/18 - Lactic acid improving. Paracentesis, negative for SBP.  Vascular surgery consulted for cold LLE, no  interventions indicated, no evidence of limb ischemia 9/19 - ID consulted, added inflammatory marker eval raising concern for vasculitis. Pressors down significantly after starting the epinephrine . Cortrak placed. For intolerance of TF, inflammatory eval: sed rate 4, lactate 6.6->4.6. tbili 17 (rising from 11.9). wbc still elevated. Hgb 7.5 to 7. Plts 49->43 9/20 - hemodynamics a little better. Still w/ sig WBC ct. Prob steroids but repeating abd US  and CT imaging of head negative to r/o mastoiditis. 9/21 - All CT imaging including CT abdomen pelvis as well as CT temporal bone all reassuring no new evidence of infection.  Ultrasound abdomen negative.  Platelets down again, sending additional eval to rule out TTP.  Getting more platelets transfused.  Change abx to CTX and metronidazole . 9/22 - Off vasopressin , minimal sedation but remains encephalopathic. 9/23 - Off pressors, more alert and following instructions.  Weaning from vent. 9/24 - Overnight agitation and required norepi briefly but weaned and extubated in the AM.   Interim History / Subjective:  Overnight, patient experienced increased agitation and went into Afib RVR.  Received digoxin  load without conversion of rhythm, then received diltiazem  dose.  Also received Precedex  Pressor requirements did increase on diltiazem  and patient was on norepi of 10 on initial evaluation this AM and weaned to 2 mcg rapidly.   Objective    Blood pressure 112/77, pulse 91, temperature 97.9 F (36.6 C), resp. rate 19, height 5' 3 (1.6 m), weight 48 kg, SpO2 99%. CVP:  [0 mmHg-53 mmHg] 4 mmHg  Vent Mode: PRVC FiO2 (%):  [30 %-40 %]  30 % Set Rate:  [20 bmp] 20 bmp Vt Set:  [420 mL] 420 mL PEEP:  [5 cmH20-8 cmH20] 5 cmH20 Plateau Pressure:  [13 cmH20-17 cmH20] 14 cmH20   Intake/Output Summary (Last 24 hours) at 09/12/2024 0839 Last data filed at 09/12/2024 0818 Gross per 24 hour  Intake 2317.25 ml  Output 4896.7 ml  Net -2579.45 ml   Filed Weights    09/10/24 0500 09/11/24 0500 09/12/24 0500  Weight: 54.5 kg 52.7 kg 48 kg    Examination: General: Chronically ill-appearing, deconditioned, resting comfortably in bed, NAD, awake. HEENT: NCAT, ET tube in place. Cardiovascular: Regular rate and rhythm. Normal S1/S2. No murmurs, rubs, or gallops appreciated. Pulmonary: Clear bilaterally to ascultation. No wheezes, crackles, or rhonchi. On PSV (40% FiO2, 5 PEEP). Abdominal: Mildly distended, no tenderness to palpation, no rebound or guarding. Skin: Jaundiced, thin skin. Extremities: LLE with bullae and violaceous discoloration persisting, wrapped with gauze dressing and mild weeping.  1+ DP and PT pulses bilaterally.  Trace peripheral edema bilaterally. Capillary refill 2 seconds. Neuro: Following instructions consistently, responds with head nod/shake to simple questions, interactive and remains awake for evaluation.   Resolved problem list  Septic shock   Assessment and Plan   Severe sepsis 2/2 GNR bacteremia: possible Dx LLE cellulitis, further complicated by acute systolic heart failure WBC continue trending up but reassuringly now off pressors and with resolved encephalopathy.  BCx with GNRs in anaerobic bottle only, but not speciated and reincubated for better growth.  Now on day 9 of antibiotics and ID planning for 10 days total pending reincubated culture results. Plan: - Restarted on norepi overnight after diltiazem , titrate norepinephrine  to goal MAP >65 - Stress dose steroids tapered off 9/23 - Cultures from 9/15 reincubated for better growth, solely GNRs noted - Metronidazole  (9/21>), CTX (9/21>), day 9 of 10 (last day 9/25) - s/p Zosyn  (9/15-9/21) - No GDMT given shock state     LLE bullae and mottling, possibly 2/2 cellulitis Possible this is secondary to cellulitis versus volume overload, now remains stable.  Considered SJS on 9/22 and discontinued rifaximin , low concern overall for this condition.  Neg for DVT on  9/17, felt exacerbated by hydrostatic pressure and volume overload. Evaluated by vascular on 9/18 and 9/19 with no concern for limb ischemia. - May ultimately require advanced wound care therapies if recovers from acute illness - Close pulse checks - Appreciate wound care assistance - Volume removal w/ CRRT continuing - CBC w/ diff x1 tomorrow to check eosinophils now that steroids have been discontinued     Acute hypoxic respiratory failure 2/2 acute lung injury Remains on ventilator but now following instructions consistently and SBI of 30, able to take adequate breaths.  Plan for extubation imminently. Plan: - Swallow screen, PT eval and treat after extubation - Diet and SLP eval pending swallow screen - Supplemental O2 as needed - PPI daily - Minimize sedation as able     AKI 2/2 ischemic ATN post PEA arrest Hypokalemia and hypophosphatemia, intermittent Continue CRRT per Nephro.  Now net +600 mL this admission and continuing to improve fluid balance via CRRT.   CRRT goal net even or negative on volume status, then trial discontinuation. Plan: - Keep foley for now - Daily CMP - Electrolyte management and CRRT per Nephro, planning to reevaluate for discontinuation in 24 hours - Adjust meds for CRRT and renal dosing - Strict I&Os     Decompensated alcoholic cirrhosis with ascites, Hx gastric varices, and superimposed shock liver  Hepatic encephalopathy ICU delirium Encephalopathy greatly improved.  3 bowel movements in past 24 hours.  Likely some component of metabolic encephalopathy and ICU delirium given nighttime agitation. Plan: - Trending INR - Continue lactulose  30 mg TID - Rifaximin  discontinued as above given concern for SJS - Monitor frequency of BMs daily - Continue to reorient, delirium precautions - Holding home propranolol  and spiro in setting of sepsis     Critical illness related protein calorie malnutrition Liquid stools Appreciate RD consult, post-pyloric  Cortrack placed 9/20. Plan: - Advance trickle feeds via Cortrak as able - Stopped stool softeners as she is on lactulose  and think gut edema affecting absorption     Chronic macrocytic anemia Status post 1 unit PRBC 9/16 and 9/20. Hemoglobin remains stable. Plan: - Holding AC given coagulopathy - AM CBC - Transfusion threshold Hgb <7     Acute on chronic thrombocytopenia 2/2 sepsis superimposed on cirrhosis, probable DIC Plts 29>38 without evidence of frank bleeding.  Received platelets 9/17, again prior to paracentesis on 9/18, linezolid  stopped 9/18, again received platelets 9/21.  Also not a candidate for [employer] given coagulopathy.  Following labs for TTP.  Haptoglobin low and LDH elevated, suggesting some hemolysis. Plan: - Heme following, appreciate recs - Plt transfusion threshold <20 per Heme - If needs procedures, threshold Plts >50 - Avoiding AC given coagulopathy and wounds with high risk for bleeding, unable to place SCDs due to LE wounds - f/u [id number]     Coagulopathy (septic coagulopathy/DIC superimposed on  decompensated cirrhosis). INR remains stable. Plan: - Trend INR     Hypothyroidism Stable chronic problem. Plan: - Continue levothyroxine  88 mcg     Glycemic control Was hypoglycemic.  Now on steroids, mildly hyperglycemia.  A1c 4.3. 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 Tobie with [facility] 9/16, who related that he has a no suspicion for mastoiditis after reviewing CT imaging.  Noted that patient has a packing wick in place and TM is open, so fluid would be expected but does not suspect mastoiditis based on radiography. Repeated imaging 9/20 improved aeration of middle ear cavity with no evidence of mastoiditis. Plan: - Ciprodex  drops changed to ofloxacin  (family request and cleared w/ ENT) both ears (started 9/20) - Monitor postauricular area  for proptosis     GI PPx: Pantoprazole  12 mg daily DVT PPx: Heparin  held given thrombocytopenia, unable to place SCDs given LE wounds   Labs   CBC: Recent Labs  Lab 09/09/24 0320 09/09/24 0941 09/10/24 0408 09/10/24 0916 09/11/24 0335 09/11/24 0339 09/12/24 0333  WBC 20.2* 18.2* 22.5*  --  28.3*  --  35.2*  NEUTROABS  --  16.2*  --   --   --   --   --   HGB 7.6* 7.2* 7.7* 8.2* 8.2* 8.8* 9.5*  HCT 22.2* 21.1* 22.6* 24.0* 23.6* 26.0* 27.0*  MCV 101.4* 101.4* 100.9*  --  100.4*  --  100.4*  PLT 19*  20* 24* 45*  --  29*  --  38*    Basic Metabolic Panel: Recent Labs  Lab 09/09/24 0320 09/09/24 1801 09/10/24 0408 09/10/24 0916 09/10/24 1518 09/11/24 0001 09/11/24 0335 09/11/24 0339 09/11/24 1556 09/12/24 0333  NA 139   < > 141   < > 139 141 141  141 144 141 141  K 3.3*   < >  3.3*   < > 3.3* 3.1* 3.1*  3.1* 3.1* 3.6 3.2*  CL 105   < > 105  --  103 105 103  105  --  105 106  CO2 23   < > 23  --  23 23 23  23   --  22 24  GLUCOSE 168*   < > 183*  --  167* 163* 150*  151*  --  202* 147*  BUN 16   < > 17  --  17 23* 20  20  --  23* 24*  CREATININE 0.94   < > 1.00  --  1.01* 1.01* 1.03*  1.10*  --  1.10* 1.15*  CALCIUM  8.5*   < > 9.1  --  8.5* 8.9 9.0  9.0  --  8.9 8.9  MG 2.6*  --  2.7*  --   --  2.2 2.4  --   --  2.4  PHOS 2.1*   < > 2.0*  --  3.3  --  1.8*  --  3.7 2.3*   < > = values in this interval not displayed.   GFR: Estimated Creatinine Clearance: 39.4 mL/min (A) (by C-G formula based on SCr of 1.15 mg/dL (H)). Recent Labs  Lab 09/06/24 1430 09/07/24 0500 09/08/24 0316 09/09/24 0320 09/09/24 0941 09/10/24 0408 09/11/24 0335 09/12/24 0333  WBC  --  20.4* 22.6* 20.2* 18.2* 22.5* 28.3* 35.2*  LATICACIDVEN 6.8* 6.6* 4.6* 2.4*  --   --   --   --     Liver Function Tests: Recent Labs  Lab 09/08/24 0316 09/08/24 1843 09/09/24 0941 09/09/24 1801 09/10/24 0408 09/10/24 1518 09/11/24 0335 09/11/24 1556 09/12/24 0333  AST 86*  --  88*  --   92*  --  84*  --  77*  ALT 39  --  43  --  50*  --  48*  --  50*  ALKPHOS 63  --  71  --  92  --  104  --  157*  BILITOT 17.0*  --  18.4*  --  18.5*  --  14.1*  --  13.0*  PROT 6.3*  --  6.2*  --  7.2  --  6.5  --  7.0  ALBUMIN  4.5  4.6   < > 4.6   < > 5.2* 4.9 4.6  4.5 4.7 4.5  4.4   < > = values in this interval not displayed.   Recent Labs  Lab 09/05/24 1738 09/06/24 0445  LIPASE 37 42  AMYLASE 41  --    Recent Labs  Lab 09/06/24 0911 09/11/24 0336  AMMONIA 45* 64*    ABG    Component Value Date/Time   PHART 7.469 (H) 09/11/2024 0339   PCO2ART 36.3 09/11/2024 0339   PO2ART 110 (H) 09/11/2024 0339   HCO3 26.4 09/11/2024 0339   TCO2 27 09/11/2024 0339   ACIDBASEDEF 1.0 09/07/2024 0500   O2SAT 99 09/11/2024 0339     Coagulation Profile: Recent Labs  Lab 09/08/24 0316 09/09/24 0320 09/10/24 0408 09/11/24 0335 09/12/24 0333  INR 3.2* 3.1* 2.9* 2.9* 3.1*    Cardiac Enzymes: Recent Labs  Lab 09/06/24 1141  CKTOTAL 408*    HbA1C: Hgb A1c MFr Bld  Date/Time Value Ref Range Status  09/10/2024 04:08 AM 4.3 (L) 4.8 - 5.6 % Final    Comment:    (NOTE) Diagnosis of Diabetes The following HbA1c ranges recommended by the American Diabetes Association (ADA) may be used as  an aid in the diagnosis of diabetes mellitus.  Hemoglobin             Suggested A1C NGSP%              Diagnosis  <5.7                   Non Diabetic  5.7-6.4                Pre-Diabetic  >6.4                   Diabetic  <7.0                   Glycemic control for                       adults with diabetes.      CBG: Recent Labs  Lab 09/11/24 1548 09/11/24 1947 09/11/24 2337 09/12/24 0338 09/12/24 0744  GLUCAP 197* 133* 153* 131* 133*    Past Medical History:  She,  has a past medical history of Hypothyroidism, Thyroid  disease, and Von Willebrand disease (HCC).   Surgical History:   Past Surgical History:  Procedure Laterality Date   CYST REMOVAL NECK  1995   IR  PARACENTESIS  09/06/2024     Social History:   reports that she quit smoking about 34 years ago. Her smoking use included cigarettes. She started smoking about 44 years ago. She has a 2 pack-year smoking history. She does not have any smokeless tobacco history on file. She reports that she does not currently use alcohol. She reports that she does not use drugs.   Family History:  Her family history includes Asthma in her mother; Eczema in her mother; Heart Problems in her father.   Allergies Allergies  Allergen Reactions   Levofloxacin Other (See Comments)   Sulfonamide Derivatives Palpitations     Home Medications  Prior to Admission medications   Medication Sig Start Date End Date Taking? Authorizing Provider  folic acid  (FOLVITE ) 1 MG tablet Take 1 tablet (1 mg total) by mouth daily. 08/16/24  Yes Dennise Lavada POUR, MD  furosemide  (LASIX ) 20 MG tablet Take 1 tablet (20 mg total) by mouth daily. 08/30/24  Yes Craig Palma R, PA-C  lactulose , encephalopathy, (ENULOSE ) 10 GM/15ML SOLN Take 15 mLs (10 g total) by mouth 2 (two) times daily. 08/21/24  Yes Federico Rosario BROCKS, MD  levothyroxine  (SYNTHROID ) 88 MCG tablet Take 1 tablet (88 mcg total) by mouth daily before breakfast. 08/15/24  Yes Singh, Prashant K, MD  midodrine  (PROAMATINE ) 5 MG tablet Take 1 tablet (5 mg total) by mouth 2 (two) times daily with a meal. 08/30/24  Yes Craig Palma SAUNDERS, PA-C  propranolol  (INDERAL ) 10 MG tablet Take 1 tablet (10 mg total) by mouth 2 (two) times daily. 08/30/24  Yes Craig Palma R, PA-C  spironolactone  (ALDACTONE ) 50 MG tablet Take 1 tablet (50 mg total) by mouth daily. 08/30/24  Yes Craig Palma SAUNDERS, PA-C  thiamine  (VITAMIN B1) 100 MG tablet Take 1 tablet (100 mg total) by mouth daily. 08/16/24  Yes Singh, Prashant K, MD  potassium chloride  SA (KLOR-CON  M) 20 MEQ tablet Take 1 tablet (20 mEq total) by mouth daily for 2 days. 08/21/24 08/31/24  Federico Rosario BROCKS, MD     Allisa Einspahr Toma, MD PGY-2, ICU  resident 09/12/24, 8:39 AM

## 2024-09-12 NOTE — Progress Notes (Signed)
 eLink Physician-Brief Progress Note Patient Name: MYNA FREIMARK DOB: July 31, 1961 MRN: [id number]   Date of Service  09/12/2024  HPI/Events of Note  Patient with sub-optimal sedation on the ventilator. K+ 3.1.  eICU Interventions  Precedex  gtt ordered. KCL 10 meq iv Q 1 hour x 4.        Aryaman Haliburton U Vianna Venezia 09/12/2024, 1:46 AM

## 2024-09-13 DIAGNOSIS — R6521 Severe sepsis with septic shock: Secondary | ICD-10-CM | POA: Diagnosis not present

## 2024-09-13 DIAGNOSIS — I8500 Esophageal varices without bleeding: Secondary | ICD-10-CM

## 2024-09-13 DIAGNOSIS — K7682 Hepatic encephalopathy: Secondary | ICD-10-CM

## 2024-09-13 DIAGNOSIS — A4150 Gram-negative sepsis, unspecified: Secondary | ICD-10-CM | POA: Diagnosis not present

## 2024-09-13 DIAGNOSIS — K7030 Alcoholic cirrhosis of liver without ascites: Secondary | ICD-10-CM | POA: Diagnosis not present

## 2024-09-13 DIAGNOSIS — R7881 Bacteremia: Secondary | ICD-10-CM | POA: Diagnosis not present

## 2024-09-13 DIAGNOSIS — G9341 Metabolic encephalopathy: Secondary | ICD-10-CM | POA: Diagnosis not present

## 2024-09-13 DIAGNOSIS — K7010 Alcoholic hepatitis without ascites: Secondary | ICD-10-CM

## 2024-09-13 DIAGNOSIS — J9601 Acute respiratory failure with hypoxia: Secondary | ICD-10-CM | POA: Diagnosis not present

## 2024-09-13 DIAGNOSIS — R21 Rash and other nonspecific skin eruption: Secondary | ICD-10-CM | POA: Diagnosis not present

## 2024-09-13 DIAGNOSIS — A419 Sepsis, unspecified organism: Secondary | ICD-10-CM | POA: Diagnosis not present

## 2024-09-13 DIAGNOSIS — E43 Unspecified severe protein-calorie malnutrition: Secondary | ICD-10-CM

## 2024-09-13 LAB — RENAL FUNCTION PANEL
Albumin: 4.9 g/dL (ref 3.5–5.0)
Albumin: 4.7 g/dL (ref 3.5–5.0)
Anion gap: 12 (ref 5–15)
Anion gap: 14 (ref 5–15)
BUN: 27 mg/dL — ABNORMAL HIGH (ref 6–20)
BUN: 46 mg/dL — ABNORMAL HIGH (ref 6–20)
CO2: 21 mmol/L — ABNORMAL LOW (ref 22–32)
CO2: 21 mmol/L — ABNORMAL LOW (ref 22–32)
Calcium: 9.6 mg/dL (ref 8.9–10.3)
Calcium: 9.9 mg/dL (ref 8.9–10.3)
Chloride: 104 mmol/L (ref 98–111)
Chloride: 103 mmol/L (ref 98–111)
Creatinine, Ser: 1.14 mg/dL — ABNORMAL HIGH (ref 0.44–1.00)
Creatinine, Ser: 1.59 mg/dL — ABNORMAL HIGH (ref 0.44–1.00)
GFR, Estimated: 55 mL/min — ABNORMAL LOW (ref >60)
GFR, Estimated: 37 mL/min — ABNORMAL LOW (ref >60)
Glucose, Bld: 165 mg/dL — ABNORMAL HIGH (ref 70–99)
Glucose, Bld: 160 mg/dL — ABNORMAL HIGH (ref 70–99)
Phosphorus: 2.6 mg/dL (ref 2.5–4.6)
Phosphorus: 3.7 mg/dL (ref 2.5–4.6)
Potassium: 3.7 mmol/L (ref 3.5–5.1)
Potassium: 4 mmol/L (ref 3.5–5.1)
Sodium: 137 mmol/L (ref 135–145)
Sodium: 138 mmol/L (ref 135–145)

## 2024-09-13 LAB — CBC WITH DIFFERENTIAL/PLATELET
Abs Immature Granulocytes: 1.44 K/uL — ABNORMAL HIGH (ref 0.00–0.07)
Basophils Absolute: 0.2 K/uL — ABNORMAL HIGH (ref 0.0–0.1)
Basophils Relative: 1 %
Eosinophils Absolute: 0 K/uL (ref 0.0–0.5)
Eosinophils Relative: 0 %
HCT: 28.4 % — ABNORMAL LOW (ref 36.0–46.0)
Hemoglobin: 10 g/dL — ABNORMAL LOW (ref 12.0–15.0)
Immature Granulocytes: 5 %
Lymphocytes Relative: 4 %
Lymphs Abs: 1.3 K/uL (ref 0.7–4.0)
MCH: 35.3 pg — ABNORMAL HIGH (ref 26.0–34.0)
MCHC: 35.2 g/dL (ref 30.0–36.0)
MCV: 100.4 fL — ABNORMAL HIGH (ref 80.0–100.0)
Monocytes Absolute: 1.7 K/uL — ABNORMAL HIGH (ref 0.1–1.0)
Monocytes Relative: 6 %
Neutro Abs: 26.1 K/uL — ABNORMAL HIGH (ref 1.7–7.7)
Neutrophils Relative %: 84 %
Platelets: 31 K/uL — ABNORMAL LOW (ref 150–400)
RBC: 2.83 MIL/uL — ABNORMAL LOW (ref 3.87–5.11)
RDW: 24.1 % — ABNORMAL HIGH (ref 11.5–15.5)
WBC: 30.8 K/uL — ABNORMAL HIGH (ref 4.0–10.5)
nRBC: 0.2 % (ref 0.0–0.2)

## 2024-09-13 LAB — HEPATIC FUNCTION PANEL
ALT: 60 U/L — ABNORMAL HIGH (ref 0–44)
AST: 89 U/L — ABNORMAL HIGH (ref 15–41)
Albumin: 4.9 g/dL (ref 3.5–5.0)
Alkaline Phosphatase: 171 U/L — ABNORMAL HIGH (ref 38–126)
Bilirubin, Direct: 5.7 mg/dL — ABNORMAL HIGH (ref 0.0–0.2)
Indirect Bilirubin: 9.2 mg/dL — ABNORMAL HIGH (ref 0.3–0.9)
Total Bilirubin: 14.9 mg/dL — ABNORMAL HIGH (ref 0.0–1.2)
Total Protein: 8.1 g/dL (ref 6.5–8.1)

## 2024-09-13 LAB — CALCIUM, IONIZED
Calcium, Ionized, Serum: 4.9 mg/dL (ref 4.5–5.6)
Calcium, Ionized, Serum: 4.7 mg/dL (ref 4.5–5.6)

## 2024-09-13 LAB — GLUCOSE, CAPILLARY
Glucose-Capillary: 156 mg/dL — ABNORMAL HIGH (ref 70–99)
Glucose-Capillary: 164 mg/dL — ABNORMAL HIGH (ref 70–99)
Glucose-Capillary: 135 mg/dL — ABNORMAL HIGH (ref 70–99)
Glucose-Capillary: 148 mg/dL — ABNORMAL HIGH (ref 70–99)
Glucose-Capillary: 168 mg/dL — ABNORMAL HIGH (ref 70–99)

## 2024-09-13 LAB — MAGNESIUM: Magnesium: 2.5 mg/dL — ABNORMAL HIGH (ref 1.7–2.4)

## 2024-09-13 LAB — PROTIME-INR
INR: 2.5 — ABNORMAL HIGH (ref 0.8–1.2)
Prothrombin Time: 28.4 s — ABNORMAL HIGH (ref 11.4–15.2)

## 2024-09-13 MED ORDER — PNEUMOCOCCAL 20-VAL CONJ VACC 0.5 ML IM SUSY: 0.5000 mL | PREFILLED_SYRINGE | INTRAMUSCULAR | Status: DC | PRN

## 2024-09-13 MED ORDER — LACTATED RINGERS IV SOLN: INTRAVENOUS | Status: DC

## 2024-09-13 MED ORDER — ANTICOAGULANT SODIUM CITRATE 4% (200MG/5ML) IV SOLN
5.0000 mL | Status: DC | PRN
  Filled 2024-09-13: qty 5

## 2024-09-13 MED ORDER — SODIUM CHLORIDE 0.9 % IV SOLN
1.0000 g | Freq: Two times a day (BID) | INTRAVENOUS | Status: DC
  Administered 2024-09-13 – 2024-09-14 (×2): 1 g via INTRAVENOUS
  Filled 2024-09-13 (×2): qty 20

## 2024-09-13 MED ORDER — HEPARIN SODIUM (PORCINE) 1000 UNIT/ML DIALYSIS
1000.0000 [IU] | INTRAMUSCULAR | Status: DC | PRN
  Administered 2024-09-13 – 2024-09-18 (×2): 2400 [IU] via INTRAVENOUS_CENTRAL
  Filled 2024-09-13 (×2): qty 6
  Filled 2024-09-13 (×2): qty 3

## 2024-09-13 NOTE — Evaluation (Signed)
 Physical Therapy Evaluation Patient Details Name: Rachel Lucas MRN: [id number] DOB: [birth year] Today's Date: 09/13/2024  History of Present Illness  Pt is a 63 y/o female admitted 09/03/24 due to melena, LLE swelling and redness. Recent admission for Klebciella bacteremia and pneumonia in setting of accidental ear canal perforation with Q-tip (8/20-8/27). New diagnosis of alcoholic cirrhosis with septic shock, LLE cellulitis. 9/16 - Worsening delirium, PEA arrest for 6 minutes with ROSC, NG tube placed. 9/17 - EEG neg for seizure. ECHO: EF 35-40%. Nephro consulted for CRRT. HD cath placed. CRRT started. 9/18 - IR Paracentesis; 9/19 - ID consulted, added inflammatory marker eval raising concern for vasculitis. Cortrak placed. 9/20 -CT imaging of head negative to r/o mastoiditis 9/21 - received platelets, 9/22 - Off vasopressin  remained encephalopathic. 9/24 - extubated 9/25 - discontinued CRRT. PMH includes Hypothyroidism, Thyroid  disease, and Von Willebrand disease (HCC).  Clinical Impression  Pt with global jaundice and cachectic appearance. Pt husband reports that prior to onset of illness, pt was mod I with ADLs, and mobility, needing assistance for medication and finances. Has not driven in 6 months. 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 Evaluation limited to bed level, by presence of CRRT femoral access. Bed place in chair position where pt able to perform limited ROM exercises and self care. Pt limited in safe mobility by increased weakness and deconditioning, as well as poor awareness of her medical condition and safety. Patient will benefit from intensive inpatient follow-up therapy, >3 hours/day. PT will continue to follow acutely.        If plan is discharge home, recommend the following: A lot of help with walking and/or transfers;A lot of help with bathing/dressing/bathroom;Assistance with cooking/housework;Direct supervision/assist for medications management;Direct  supervision/assist for financial management;Assist for transportation;Help with stairs or ramp for entrance;Supervision due to cognitive status     Equipment Recommendations Rolling walker (2 wheels);BSC/3in1;Wheelchair (measurements PT);Wheelchair cushion (measurements PT)     Functional Status Assessment Patient has had a recent decline in their functional status and demonstrates the ability to make significant improvements in function in a reasonable and predictable amount of time.     Precautions / Restrictions Precautions Precautions: Fall Recall of Precautions/Restrictions: Impaired Restrictions Weight Bearing Restrictions Per Provider Order: No      Mobility  Bed Mobility Overal bed mobility: Needs Assistance Bed Mobility: Rolling Rolling: Max assist, +2 for safety/equipment              Transfers                   General transfer comment: NT this session, pending CRRT femoral catheter removal, chair position from bed used             Pertinent Vitals/Pain Pain Assessment Pain Assessment: No/denies pain    Home Living Family/patient expects to be discharged to:: Private residence Living Arrangements: Spouse/significant other Available Help at Discharge: Family;Available 24 hours/day Type of Home: House Home Access: Stairs to enter Entrance Stairs-Rails: Doctor, general practice of Steps: 3   Home Layout: Multi-level;Able to live on main level with bedroom/bathroom Home Equipment: Shower seat - built in      Prior Function Prior Level of Function : Independent/Modified Independent             Mobility Comments: Ind ADLs Comments: mild cognition deficits, husband managing medication and finances     Extremity/Trunk Assessment   Upper Extremity Assessment Upper Extremity Assessment: Defer to OT evaluation    Lower Extremity  Assessment Lower Extremity Assessment: Generalized weakness;RLE deficits/detail (grossly 3+/5 with  movement in bed) RLE Deficits / Details: limited by CRRT femoral access.    Cervical / Trunk Assessment Cervical / Trunk Assessment: Normal  Communication   Communication Communication: Impaired Factors Affecting Communication: Reduced clarity of speech    Cognition Arousal: Alert Behavior During Therapy: Flat affect   PT - Cognitive impairments: Awareness                       PT - Cognition Comments: poor understanding of the severity of her illness Following commands: Intact       Cueing Cueing Techniques: Verbal cues, Gestural cues, Tactile cues     General Comments General comments (skin integrity, edema, etc.): VSS on RA, BP stable with positional change        Assessment/Plan    PT Assessment Patient needs continued PT services  PT Problem List Decreased strength;Decreased range of motion;Decreased activity tolerance;Decreased balance;Decreased mobility;Decreased coordination;Decreased cognition;Decreased knowledge of use of DME;Decreased safety awareness;Decreased knowledge of precautions;Cardiopulmonary status limiting activity       PT Treatment Interventions DME instruction;Gait training;Functional mobility training;Stair training;Therapeutic activities;Therapeutic exercise;Balance training;Neuromuscular re-education;Cognitive remediation;Patient/family education    PT Goals (Current goals can be found in the Care Plan section)  Acute Rehab PT Goals Patient Stated Goal: to go home PT Goal Formulation: With patient Time For Goal Achievement: 09/27/24 Potential to Achieve Goals: Good    Frequency Min 2X/week     Co-evaluation PT/OT/SLP Co-Evaluation/Treatment: Yes Reason for Co-Treatment: Complexity of the patient's impairments (multi-system involvement);Necessary to address cognition/behavior during functional activity;For patient/therapist safety;To address functional/ADL transfers PT goals addressed during session: Strengthening/ROM OT goals  addressed during session: ADL's and self-care;Strengthening/ROM       AM-PAC PT 6 Clicks Mobility  Outcome Measure Help needed turning from your back to your side while in a flat bed without using bedrails?: None Help needed moving from lying on your back to sitting on the side of a flat bed without using bedrails?: None Help needed moving to and from a bed to a chair (including a wheelchair)?: A Little Help needed standing up from a chair using your arms (e.g., wheelchair or bedside chair)?: A Little Help needed to walk in hospital room?: A Lot Help needed climbing 3-5 steps with a railing? : Total 6 Click Score: 17    End of Session   Activity Tolerance: Patient tolerated treatment well Patient left: in bed;with call bell/phone within reach;with bed alarm set Nurse Communication: Mobility status PT Visit Diagnosis: Other abnormalities of gait and mobility (R26.89)    Time: 8889-8861 PT Time Calculation (min) (ACUTE ONLY): 28 min   Charges:   PT Evaluation $PT Eval Moderate Complexity: 1 Mod   PT General Charges $$ ACUTE PT VISIT: 1 Visit         Uziel Covault B. Fleeta Lapidus PT, DPT Acute Rehabilitation Services Please use secure chat or  Call Office [phone]   Almarie KATHEE Fleeta [facility] 09/13/2024, 3:48 PM

## 2024-09-13 NOTE — Progress Notes (Signed)
 Nutrition Follow-up  DOCUMENTATION CODES:  Severe malnutrition in context of acute illness/injury, Underweight  INTERVENTION:  Continue TF via Cortrak: Vital 1.5 at 72ml/hr ( per day) 60ml ProSource TF20 BID Provides 1600 kcal, 105g protein, free water  daily   Renal MVI with minerals daily  NUTRITION DIAGNOSIS:  Severe Malnutrition related to acute illness (PEA arrest, recent diagnosis of alcoholic cirrhosis) as evidenced by severe muscle depletion, severe fat depletion. - diagnosis updated 9/25  GOAL:  Patient will meet greater than or equal to 90% of their needs - met via TF  MONITOR:  Labs, Weight trends, TF tolerance, Skin, I & O's  REASON FOR ASSESSMENT:  Ventilator, Consult Other (Comment) (CRRT protocol)  ASSESSMENT:  Pt admitted with c/o LLE swelling and redness. PMH significant for new diagnosis of alcoholic cirrhosis with septic shock though 2/2 LLE cellulitis, persistent otitis media, recent admission for klebsiella bacteremia and PNA.  9/15: admitted 9/16: PEA arrest for 6 minutes with ROSC 9/17: repeat chest xray with ARDS; CRRT initiated for acidosis, shock, worsening AKI and volume status  9/18: s/p paracentesis- yield 1.3L 9/19: post pyloric Cortrak placed (suspected in the LOT per imaging) 9/24: extubated 9/25: discontinue CRRT  Pt discussed in interdisciplinary rounds.  ***  Checked in with patient at bedside now post extubation. She continues on tube feeding via Cortrak at goal rate.  She does not report any nausea or abdominal discomfort. Abdomen  soft and non-distended.   SLP following and monitoring for readiness to participate in objective swallow assessment. Pt with delayed initiation of swallow and coughing. Continue to recommend NPO order at this time.   First measured weight (9/16): 63.7 kg Current weight: 45.8 kg  Given significant reduction in edema, not able to repeat nutrition focused physical exam. Pt observed with severe  malnutrition given presence of severe muscle and subcutaneous fat depletions. It is likely this was present on admission. Her husband also endorses at bedside that with reduction in presence of fluid she looks more like she's back to her baseline weight. Per initial assessment her reported her usual weight was about 106 lbs. Current weight today is 101 kg.   Drains/lines: Cortrak Right femoral a-line Right femoral CVC triple lumen Right internal jugular HD cath Flatus tube  I/O's: UOP: 33ml x24 hours CRRT: x24 hours Net since admission: -   Medications: folvite , SSI 0-9 units q4h, lactulose  TID, rena-vit, thiamine  Drips: Levo @ 2 mcg/min  Labs:  BUN 27 Cr 1.14 Mg 2.5 Alkaline phosphatase 171 AST 89 ALT 60 Total bilirubin 14.9 GFR 55 CBG's 129-164 x24 hours Ammonia trends 64, 45, 33  NUTRITION - FOCUSED PHYSICAL EXAM:  Flowsheet Row Most Recent Value  Orbital Region Severe depletion  Upper Arm Region Severe depletion  Thoracic and Lumbar Region Severe depletion  Buccal Region Severe depletion  Temple Region Severe depletion  Clavicle Bone Region Severe depletion  Clavicle and Acromion Bone Region Severe depletion  Scapular Bone Region Severe depletion  Dorsal Hand Severe depletion  Patellar Region Severe depletion  Anterior Thigh Region Severe depletion  Posterior Calf Region Severe depletion  Edema (RD Assessment) Mild  [non-pitting generalized]  Hair Reviewed  Eyes Other (Comment)  [jaundice]  Mouth Other (Comment)  [blood]  Skin Other (Comment)  [jaundice]  Nails Reviewed    Diet Order:   Diet Order             Diet NPO time specified Except for: Ice Chips, Sips with Meds  Diet effective now  EDUCATION NEEDS:  No education needs have been identified at this time  Skin:  Skin Assessment: Reviewed RN Assessment  Last BM:  x24 hours via flatus tube  Height:  Ht Readings from Last 1 Encounters:  09/07/24 5' 3  (1.6 m)    Weight:  Wt Readings from Last 1 Encounters:  09/13/24 45.8 kg    Ideal Body Weight:  52.3 kg  BMI:  Body mass index is 17.89 kg/m.  Estimated Nutritional Needs:   Kcal:  1500-1700  Protein:  95-105g  Fluid:  1L + UOP  Rachel Lucas, RDN, LDN Clinical Nutrition See [facility] for contact information.

## 2024-09-13 NOTE — Evaluation (Signed)
 Occupational Therapy Evaluation Patient Details Name: Rachel Lucas MRN: [id number] DOB: 02-25-1961 Today's Date: 09/13/2024   History of Present Illness   Pt is a 63 y/o female admitted 09/03/24 due to melena, LLE swelling and redness. Recent admission for Klebciella bacteremia and pneumonia in setting of accidental ear canal perforation with Q-tip (8/20-8/27). New diagnosis of alcoholic cirrhosis with septic shock, LLE cellulitis. 9/16 - Worsening delirium, PEA arrest for 6 minutes with ROSC, NG tube placed. 9/17 - EEG neg for seizure. ECHO: EF 35-40%. Nephro consulted for CRRT. HD cath placed. CRRT started. 9/18 - IR Paracentesis; 9/19 - ID consulted, added inflammatory marker eval raising concern for vasculitis. Cortrak placed. 9/20 -CT imaging of head negative to r/o mastoiditis 9/21 - received platelets, 9/22 - Off vasopressin  remained encephalopathic. 9/24 - extubated 9/25 - discontinued CRRT. PMH includes Hypothyroidism, Thyroid  disease, and Von Willebrand disease (HCC).     Clinical Impressions Pt today appears globally jaundice, frail, deconditioned. Prior to admission she was mod I for ADL and mobility - and husband managed her medicine and their finances. She has a license but has not been driving. She used to be a Marketing executive and cyclist. Today limited to bed level eval due to recent completion of CRRT with femoral access still in place. Bed chair position and egress used to facilitate movement this session, and Pt able to participate in grooming tasks, bed level exercises and MMT. She demonstrates generalized weakness, deconditioning, cognitive deficits, decreased safety awareness, OT will follow acutely and currently recommending rehab of >3 hours daily to maximize safety and independence in ADL and functional transfers. She has a very supportive husband and extended family who are local and she is motivated by her dog. Next session plan for OOB transfer as well as more  functional ADL activity and continued cognitive assessment.      If plan is discharge home, recommend the following:   Two people to help with walking and/or transfers;A lot of help with bathing/dressing/bathroom;Assistance with cooking/housework;Direct supervision/assist for medications management;Direct supervision/assist for financial management;Assist for transportation;Help with stairs or ramp for entrance;Supervision due to cognitive status     Functional Status Assessment   Patient has had a recent decline in their functional status and demonstrates the ability to make significant improvements in function in a reasonable and predictable amount of time.     Equipment Recommendations   Other (comment) (defer to next venue)     Recommendations for Other Services   Rehab consult;PT consult;Speech consult     Precautions/Restrictions   Precautions Precautions: Fall Recall of Precautions/Restrictions: Impaired Restrictions Weight Bearing Restrictions Per Provider Order: No     Mobility Bed Mobility Overal bed mobility: Needs Assistance Bed Mobility: Rolling Rolling: Max assist, +2 for safety/equipment              Transfers                   General transfer comment: NT this session, pending CRRT femoral catheter removal, chair position from bed used      Balance                                           ADL either performed or assessed with clinical judgement   ADL Overall ADL's : Needs assistance/impaired Eating/Feeding: NPO Eating/Feeding Details (indicate cue type and reason): able to bring hands to mouth, fatigues  quickly Grooming: Wash/dry face;Minimal assistance;Bed level Grooming Details (indicate cue type and reason): fatigues quickly Upper Body Bathing: Moderate assistance   Lower Body Bathing: Maximal assistance   Upper Body Dressing : Maximal assistance   Lower Body Dressing: Maximal assistance;Bed  level Lower Body Dressing Details (indicate cue type and reason): Pt assists with attempts to lift feet off the bed   Toilet Transfer Details (indicate cue type and reason): NT this session Toileting- Clothing Manipulation and Hygiene: Maximal assistance       Functional mobility during ADLs:  (NT this session) General ADL Comments: globally jaundice, generalized weakness, decreased cognition, limited insights into deficits     Vision Ability to See in Adequate Light: 0 Adequate Patient Visual Report: No change from baseline Additional Comments: Scleral icterus present, tracks and makes eye contact appropriately     Perception         Praxis         Pertinent Vitals/Pain Pain Assessment Pain Assessment: No/denies pain     Extremity/Trunk Assessment Upper Extremity Assessment Upper Extremity Assessment: Generalized weakness (MMT generally 3+ shoulders, 4- forearm, 4 grasp Bil)   Lower Extremity Assessment Lower Extremity Assessment: Defer to PT evaluation       Communication Communication Communication: Impaired Factors Affecting Communication: Reduced clarity of speech   Cognition Arousal: Alert Behavior During Therapy: Flat affect Cognition: History of cognitive impairments, Cognition impaired   Orientation impairments: Situation Awareness: Intellectual awareness impaired, Online awareness impaired Memory impairment (select all impairments): Working memory Attention impairment (select first level of impairment): Sustained attention Executive functioning impairment (select all impairments): Reasoning, Problem solving, Organization OT - Cognition Comments: Pt perseverates on topics, requires cues, decreased awareness of situation and abilities.                 Following commands: Intact       Cueing  General Comments   Cueing Techniques: Verbal cues;Gestural cues;Tactile cues  VSS on RA, BP stable despite positional changes   Exercises      Shoulder Instructions      Home Living Family/patient expects to be discharged to:: Private residence Living Arrangements: Spouse/significant other Available Help at Discharge: Family;Available 24 hours/day Type of Home: House Home Access: Stairs to enter [employer] of Steps: 3 Entrance Stairs-Rails: Right;Left Home Layout: Multi-level;Able to live on main level with bedroom/bathroom     Bathroom Shower/Tub: Producer, television/film/video:  (comfort) Bathroom Accessibility: Yes How Accessible: Accessible via walker Home Equipment: Shower seat - built in          Prior Functioning/Environment Prior Level of Function : Independent/Modified Independent             Mobility Comments: Ind ADLs Comments: mild cognition deficits, husband managing medication and finances    OT Problem List: Decreased strength;Decreased range of motion;Decreased activity tolerance;Impaired balance (sitting and/or standing);Decreased cognition;Decreased safety awareness;Decreased knowledge of use of DME or AE;Cardiopulmonary status limiting activity;Increased edema   OT Treatment/Interventions: Self-care/ADL training;Therapeutic exercise;Energy conservation;DME and/or AE instruction;Therapeutic activities;Patient/family education;Balance training      OT Goals(Current goals can be found in the care plan section)   Acute Rehab OT Goals Patient Stated Goal: Get home ASAP, get back on her road bike OT Goal Formulation: With patient Time For Goal Achievement: 09/27/24 Potential to Achieve Goals: Fair ADL Goals Pt Will Perform Grooming: with modified independence;sitting Pt Will Perform Upper Body Dressing: with set-up;sitting Pt Will Perform Lower Body Dressing: with contact guard assist;sit to/from stand Pt Will  Transfer to Toilet: with contact guard assist;ambulating Pt Will Perform Toileting - Clothing Manipulation and hygiene: with supervision;sitting/lateral  leans Additional ADL Goal #1: Pt will maintain unsupported sitting EOB for approx 5 min to participate in ADL at supervision level   OT Frequency:  Min 2X/week    Co-evaluation PT/OT/SLP Co-Evaluation/Treatment: Yes Reason for Co-Treatment: Complexity of the patient's impairments (multi-system involvement);Necessary to address cognition/behavior during functional activity;For patient/therapist safety;To address functional/ADL transfers PT goals addressed during session: Strengthening/ROM OT goals addressed during session: ADL's and self-care;Strengthening/ROM      AM-PAC OT 6 Clicks Daily Activity     Outcome Measure Help from another person eating meals?: Total (NPO) Help from another person taking care of personal grooming?: A Little Help from another person toileting, which includes using toliet, bedpan, or urinal?: A Lot Help from another person bathing (including washing, rinsing, drying)?: A Lot Help from another person to put on and taking off regular upper body clothing?: A Lot Help from another person to put on and taking off regular lower body clothing?: A Lot 6 Click Score: 12   End of Session Nurse Communication: Mobility status;Precautions  Activity Tolerance: Patient tolerated treatment well Patient left: in bed;with call bell/phone within reach;with nursing/sitter in room (chair position)  OT Visit Diagnosis: Unsteadiness on feet (R26.81);Muscle weakness (generalized) (M62.81);Feeding difficulties (R63.3);Other symptoms and signs involving cognitive function;Adult, failure to thrive (R62.7)                Time: 8891-8857 OT Time Calculation (min): 34 min Charges:  OT General Charges $OT Visit: 1 Visit OT Evaluation $OT Eval Moderate Complexity: 1 Mod Leita DEL OTR/L Acute Rehabilitation Services Office: [phone]  Leita JINNY Odea 09/13/2024, 1:14 PM

## 2024-09-13 NOTE — Progress Notes (Signed)
 Speech Language Pathology Treatment: Dysphagia  Patient Details Name: Rachel Lucas MRN: [id number] DOB: [birth year] Today's Date: 09/13/2024 Time: 9052-8984 SLP Time Calculation (min) (ACUTE ONLY): 28 min  Assessment / Plan / Recommendation Clinical Impression  Patient seen by SLP for skilled treatment focused on dysphagia goals. She is off of CRRT. A friend was in the room. Patient was awake, more alert overall but continues with confusion. She continues to be aphonic and at one point she told SLP I'm doing it on purpose. She accepted single ice chips, exhibiting oral holding, delayed oral transit, minimal attempts at mastication. Swallow initiation appeared delayed and patient with reduced hyolaryngeal movement. Delayed coughing noted, with only one instance of audible mobilization of secretions but unable to expectorate. After a few ice chips she said she wanted to stop because it's boring and requested water . SLP administered 1/2 spoon sips of water , exhibiting instances of immediate coughing. SLP attempted to educated patient about rationale for ice chips only but she did not demonstrate understanding and became somewhat frustrated with SLP. SLP will continue to follow to determine readiness for objective swallow assessment.    HPI HPI: Patient is a 63 y.o. female with PMH: recent diagnosis of alcoholic cirrhosis with septic shock thought to be secondary to cellulitis. She presented to the hospital on 09/03/24 due to concern for single episode of melena  the day prior to admission but subsequently with normal stools. In ED, patient was unresponsive to fluids with BP 60/30's. She went into PEA arrest on 9/16 for 6 minutes with ROSC, NG tube in place. She was intubated after PEA and ultimately extubated on 9/24. She failed [facility] with RN and SLP swallow evaluation ordered.      SLP Plan  Continue with current plan of care          Recommendations  Diet recommendations:  NPO;Other(comment) (PRN ice chips with RN supervision) Medication Administration: Via alternative means                  Oral care QID;Staff/trained caregiver to provide oral care;Oral care prior to ice chip/H20   Frequent or constant Supervision/Assistance Dysphagia, unspecified (R13.10)     Continue with current plan of care     Norleen IVAR Blase, MA, CCC-SLP Speech Therapy

## 2024-09-13 NOTE — Progress Notes (Signed)
 NAME:  Rachel Lucas, MRN:  [id number], DOB:  10-28-1961, LOS: 10 ADMISSION DATE:  09/03/2024, CONSULTATION DATE:  09/05/2024 REFERRING MD:  EDP, CHIEF COMPLAINT:  Left leg pain   History of Present Illness:  Patient is a 63 yo female with new diagnosis of alcoholic cirrhosis with septic shock thought 2/2 LLE cellulitis, does have persistent otitis media from recent prior admission for Klebsiella pneumonia and bacteremia.    Patient had concern for single episode of melena the day prior to admission, but subsequently had normal stools.  Had no cough, congestion, or hematemesis.  Did endorse worsening LLE swelling and redness with some pain the day of admission.   Recently, patient was admitted for Klebciella bacteremia and pneumonia in setting of accidental ear canal perforation with Q-tip (8/20-8/27).   In the ED, patient was unresponsive to fluids with BP 60/30s and PCCM was consulted for admission.  Patient stated that her baseline SBP is in the 90s.   Pertinent  Medical History  Fam hx of VWD but test 8/25 neg Alcoholic cirrhosis recent new diagnosis Sober for 1 month per self and husband   Significant Hospital Events: Including procedures, antibiotic start and stop dates in addition to other pertinent events   9/15 - Admitted, working dx LE cellulitis and SBP in setting of nex dx of ETOH related cirrhosis.  started Zosyn  (9/15-) and linezolid  (9/15-), GI consulted with no concern for GIB, CVC and Art line placed in PM 9/16 - Worsening delirium, PEA arrest for 6 minutes with ROSC, NG tube in place 9/17 - EEG neg for seizure. ECHO: EF 35-40%. Became hyperglycemic after switch to D10 fluids. Had minimal urine output at 50 mL. Nephro consulted for CRRT. HD cath placed.  CRRT started. IR consulted. Felt not stable for para. Stating: coagulopathy, shock and hypothermia as factors. 9/18 - Lactic acid improving. Paracentesis, negative for SBP.  Vascular surgery consulted for cold LLE, no  interventions indicated, no evidence of limb ischemia 9/19 - ID consulted, added inflammatory marker eval raising concern for vasculitis. Pressors down significantly after starting the epinephrine . Cortrak placed. For intolerance of TF, inflammatory eval: sed rate 4, lactate 6.6->4.6. tbili 17 (rising from 11.9). wbc still elevated. Hgb 7.5 to 7. Plts 49->43 9/20 - hemodynamics a little better. Still w/ sig WBC ct. Prob steroids but repeating abd US  and CT imaging of head negative to r/o mastoiditis. 9/21 - All CT imaging including CT abdomen pelvis as well as CT temporal bone all reassuring no new evidence of infection.  Ultrasound abdomen negative.  Platelets down again, sending additional eval to rule out TTP.  Getting more platelets transfused.  Change abx to CTX and metronidazole . 9/22 - Off vasopressin , minimal sedation but remains encephalopathic. 9/23 - Off pressors, more alert and following instructions.  Weaning from vent. 9/24 - Overnight agitation and required norepi briefly but weaned and extubated in the AM, changed from CTX and metronidazole  to meropenem . 9/25 - Stable off ventilator, intermittent pressor requirements, discontinuing CRRT.   Interim History / Subjective:  Overnight, the patient was agitated per bedside RN, but redirectable. This AM, patient is awake and following instructions, though confused and asking to go home repeatedly.   Objective    Blood pressure 99/71, pulse (!) 115, temperature 97.9 F (36.6 C), temperature source Oral, resp. rate 18, height 5' 3 (1.6 m), weight 45.8 kg, SpO2 100%. CVP:  [0 mmHg-18 mmHg] 10 mmHg  Vent Mode: PSV;CPAP FiO2 (%):  [40 %] 40 % PEEP:  [  5 cmH20] 5 cmH20 Pressure Support:  [8 cmH20] 8 cmH20   Intake/Output Summary (Last 24 hours) at 09/13/2024 0745 Last data filed at 09/13/2024 0700 Gross per 24 hour  Intake 1913.34 ml  Output 3456.1 ml  Net -1542.76 ml   Filed Weights   09/11/24 0500 09/12/24 0500 09/13/24 0500   Weight: 52.7 kg 48 kg 45.8 kg    Examination: General: Chronically ill-appearing, deconditioned, awake, NAD. HEENT: NCAT.  Scleral icterus, no injections.  Dry, cracked lips. Cardiovascular: Regular rate and rhythm. Normal S1/S2. No murmurs, rubs, or gallops appreciated. 2+ radial pulses. Pulmonary: Clear bilaterally to ascultation. No wheezes, crackles, or rhonchi. No increased WOB, no accessory muscle usage on room air. Abdominal: Mildly distended. No rebound or guarding. Skin: Warm and dry. Jaundiced. Scattered violaceous discoloration of skin, most significant LLE but present on all extremities and chest. Extremities: Trace peripheral edema bilaterally. LLE with persistent bullae and violaceous discoloration. 2+ PT and DP pulses bilaterally. Capillary refill <2 seconds.   Resolved problem list  Septic shock Acute hypoxic respiratory failure 2/2 acute lung injury (extubated 9/24)   Assessment and Plan    Severe sepsis 2/2 GNR bacteremia: possible Dx LLE cellulitis, further complicated by acute systolic heart failure WBC now with slight downtrend (35.2>30.8) with slight pressor requirement this AM.  BCx with GNRs in anaerobic bottle only, but not speciated and reincubated for better growth.  Now on day 10 of antibiotics and ID initially planning for 10 days total, will follow up recommendations today concerning discontinuation. Plan: - Restarted on norepi overnight after diltiazem , titrate norepinephrine  to goal MAP >65 - Stress dose steroids tapered off 9/23 - Cultures from 9/15 reincubated for better growth, solely GNRs noted - Meropenem  (9/24>), day 10 of 10 (last day 9/25) - s/p Zosyn  (9/15-9/21), CTX (9/21>9/24), metronidazole  (9/21>9/24) - No GDMT given shock state - PPI daily     LLE bullae and mottling, possibly 2/2 cellulitis Possible this is secondary to cellulitis versus volume overload, now remains stable.  Considered SJS on 9/22 and discontinued rifaximin , low  concern overall for this condition.  Neg for DVT on 9/17, felt exacerbated by hydrostatic pressure and volume overload. Evaluated by vascular on 9/18 and 9/19 with no concern for limb ischemia. No eosinophilia on CBC w/ diff. - May ultimately require advanced wound care therapies if recovers from acute illness - Close pulse checks - Appreciate wound care assistance - Volume removal w/ CRRT continuing     Extubated 9/24 Dysphagia Now extubated and stable on room air.  SLP still recommending NPO given severe aspiration risk. Plan: - NPO - Meds per tube and IV - Oral care QID - SLP following - PT eval and treat - Consider mIVF this PM, caution given discontinuation of CRRT    AKI 2/2 ischemic ATN post PEA arrest Hypokalemia and hypophosphatemia, intermittent Continue CRRT per Nephro.  Now net -900 mL this admission and at goal.  Following up with Neprology concerning discontinuation of CRRT. Plan: - Discontinue CRRT today - Strict I&Os - Foley removed 9/24 - Renal panels BID     Decompensated alcoholic cirrhosis with ascites, Hx gastric varices, and superimposed shock liver Hepatic encephalopathy ICU delirium Encephalopathy improved, but remains agitated overnight.  3 bowel movements in past 24 hours.  Likely component of metabolic encephalopathy and ICU delirium given nighttime agitation. Plan: - Decreased lactulose  to 20 mg TID, follow with ammonia as needed - Rifaximin  discontinued as above given concern for SJS - Monitor frequency of BMs  daily - Continue to reorient, delirium precautions - Holding home propranolol  and spiro in setting of sepsis - Minimize sedation as able     Critical illness related protein calorie malnutrition Liquid stools Appreciate RD consult, post-pyloric Cortrack placed 9/20. Plan: - Advance trickle feeds via Cortrak as able - Stopped stool softeners as she is on lactulose  and think gut edema affecting absorption     Chronic macrocytic  anemia Status post 1 unit PRBC 9/16 and 9/20. Hemoglobin now climbing. Plan: - Holding AC given coagulopathy - AM CBC - Transfusion threshold Hgb <7     Acute on chronic thrombocytopenia 2/2 sepsis superimposed on cirrhosis, possible DIC Coagulopathy (septic coagulopathy/DIC superimposed on  decompensated cirrhosis) Plts 38>31 without evidence of frank bleeding.  Received platelets 9/17, again prior to paracentesis on 9/18, linezolid  stopped 9/18, again received platelets 9/21.  Also not a candidate for [facility] given coagulopathy.  Following labs for TTP.  Haptoglobin low and LDH elevated, suggesting some hemolysis. Plan: - Heme following, appreciate recs - Plt transfusion threshold <20 per Heme - If needs procedures, threshold Plts >50 - Avoiding AC given coagulopathy and wounds with high risk for bleeding, unable to place SCDs due to LE wounds - f/u [id number] - Follow INR every 3 days, daily CBC     Hypothyroidism Stable chronic problem. Plan: - Continue levothyroxine  88 mcg     Glycemic control Now off steroids, mildly hyperglycemic.  Tube feeds continuing.  A1c 4.3. 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 Tobie with [facility] 9/16, who related that he has a no suspicion for mastoiditis after reviewing CT imaging.  Noted that patient has a packing wick in place and TM is open, so fluid would be expected but does not suspect mastoiditis based on radiography. Repeated imaging 9/20 improved aeration of middle ear cavity with no evidence of mastoiditis. Plan: - Ciprodex  drops changed to ofloxacin  (family request and cleared w/ ENT) both ears (started 9/20) - Monitor postauricular area for proptosis     GI PPx: Pantoprazole  BID DVT PPx: Heparin  held given thrombocytopenia, unable to place SCDs given LE wounds   Labs   CBC: Recent Labs  Lab 09/09/24 0941 09/10/24 0408  09/10/24 0916 09/11/24 0335 09/11/24 0339 09/12/24 0333 09/13/24 0418  WBC 18.2* 22.5*  --  28.3*  --  35.2* 30.8*  NEUTROABS 16.2*  --   --   --   --   --  26.1*  HGB 7.2* 7.7* 8.2* 8.2* 8.8* 9.5* 10.0*  HCT 21.1* 22.6* 24.0* 23.6* 26.0* 27.0* 28.4*  MCV 101.4* 100.9*  --  100.4*  --  100.4* 100.4*  PLT 24* 45*  --  29*  --  38* 31*    Basic Metabolic Panel: Recent Labs  Lab 09/11/24 0001 09/11/24 0335 09/11/24 0339 09/11/24 1556 09/12/24 0333 09/12/24 0808 09/12/24 1523 09/13/24 0418  NA 141 141  141   < > 141 141 138 139 137  K 3.1* 3.1*  3.1*   < > 3.6 3.2* 4.2 4.0 3.7  CL 105 103  105  --  105 106 106 103 104  CO2 23 23  23   --  22 24 23 22  21*  GLUCOSE 163* 150*  151*  --  202* 147* 135* 144* 165*  BUN 23* 20  20  --  23* 24* 24* 24* 27*  CREATININE 1.01* 1.03*  1.10*  --  1.10* 1.15* 1.17* 1.00 1.14*  CALCIUM  8.9 9.0  9.0  --  8.9 8.9 9.0 9.3 9.6  MG 2.2 2.4  --   --  2.4 2.5*  --  2.5*  PHOS  --  1.8*  --  3.7 2.3*  --  3.0 2.6   < > = values in this interval not displayed.   GFR: Estimated Creatinine Clearance: 37.9 mL/min (A) (by C-G formula based on SCr of 1.14 mg/dL (H)). Recent Labs  Lab 09/06/24 1430 09/07/24 0500 09/07/24 0500 09/08/24 0316 09/09/24 0320 09/09/24 0941 09/10/24 0408 09/11/24 0335 09/12/24 0333 09/13/24 0418  WBC  --  20.4*   < > 22.6* 20.2*   < > 22.5* 28.3* 35.2* 30.8*  LATICACIDVEN 6.8* 6.6*  --  4.6* 2.4*  --   --   --   --   --    < > = values in this interval not displayed.    Liver Function Tests: Recent Labs  Lab 09/09/24 0941 09/09/24 1801 09/10/24 0408 09/10/24 1518 09/11/24 0335 09/11/24 1556 09/12/24 0333 09/12/24 1523 09/13/24 0418  AST 88*  --  92*  --  84*  --  77*  --  89*  ALT 43  --  50*  --  48*  --  50*  --  60*  ALKPHOS 71  --  92  --  104  --  157*  --  171*  BILITOT 18.4*  --  18.5*  --  14.1*  --  13.0*  --  14.9*  PROT 6.2*  --  7.2  --  6.5  --  7.0  --  8.1  ALBUMIN  4.6   < > 5.2*    < > 4.6  4.5 4.7 4.5  4.4 4.9 4.9  4.9   < > = values in this interval not displayed.   No results for input(s): LIPASE, AMYLASE in the last 168 hours. Recent Labs  Lab 09/06/24 0911 09/11/24 0336  AMMONIA 45* 64*    ABG    Component Value Date/Time   PHART 7.469 (H) 09/11/2024 0339   PCO2ART 36.3 09/11/2024 0339   PO2ART 110 (H) 09/11/2024 0339   HCO3 26.4 09/11/2024 0339   TCO2 27 09/11/2024 0339   ACIDBASEDEF 1.0 09/07/2024 0500   O2SAT 99 09/11/2024 0339     Coagulation Profile: Recent Labs  Lab 09/09/24 0320 09/10/24 0408 09/11/24 0335 09/12/24 0333 09/13/24 0418  INR 3.1* 2.9* 2.9* 3.1* 2.5*    Cardiac Enzymes: Recent Labs  Lab 09/06/24 1141  CKTOTAL 408*    HbA1C: Hgb A1c MFr Bld  Date/Time Value Ref Range Status  09/10/2024 04:08 AM 4.3 (L) 4.8 - 5.6 % Final    Comment:    (NOTE) Diagnosis of Diabetes The following HbA1c ranges recommended by the American Diabetes Association (ADA) may be used as an aid in the diagnosis of diabetes mellitus.  Hemoglobin             Suggested A1C NGSP%              Diagnosis  <5.7                   Non Diabetic  5.7-6.4                Pre-Diabetic  >6.4                   Diabetic  <7.0  Glycemic control for                       adults with diabetes.      CBG: Recent Labs  Lab 09/12/24 1520 09/12/24 1933 09/12/24 2354 09/13/24 0425 09/13/24 0733  GLUCAP 129* 138* 137* 156* 164*    Past Medical History:  She,  has a past medical history of Hypothyroidism, Thyroid  disease, and Von Willebrand disease (HCC).   Surgical History:   Past Surgical History:  Procedure Laterality Date   CYST REMOVAL NECK  1995   IR PARACENTESIS  09/06/2024     Social History:   reports that she quit smoking about 34 years ago. Her smoking use included cigarettes. She started smoking about 44 years ago. She has a 2 pack-year smoking history. She does not have any smokeless tobacco history  on file. She reports that she does not currently use alcohol. She reports that she does not use drugs.   Family History:  Her family history includes Asthma in her mother; Eczema in her mother; Heart Problems in her father.   Allergies Allergies  Allergen Reactions   Levofloxacin Other (See Comments)   Sulfonamide Derivatives Palpitations     Home Medications  Prior to Admission medications   Medication Sig Start Date End Date Taking? Authorizing Provider  folic acid  (FOLVITE ) 1 MG tablet Take 1 tablet (1 mg total) by mouth daily. 08/16/24  Yes Dennise Lavada POUR, MD  furosemide  (LASIX ) 20 MG tablet Take 1 tablet (20 mg total) by mouth daily. 08/30/24  Yes Craig Palma R, PA-C  lactulose , encephalopathy, (ENULOSE ) 10 GM/15ML SOLN Take 15 mLs (10 g total) by mouth 2 (two) times daily. 08/21/24  Yes Federico Rosario BROCKS, MD  levothyroxine  (SYNTHROID ) 88 MCG tablet Take 1 tablet (88 mcg total) by mouth daily before breakfast. 08/15/24  Yes Singh, Prashant K, MD  midodrine  (PROAMATINE ) 5 MG tablet Take 1 tablet (5 mg total) by mouth 2 (two) times daily with a meal. 08/30/24  Yes Craig Palma SAUNDERS, PA-C  propranolol  (INDERAL ) 10 MG tablet Take 1 tablet (10 mg total) by mouth 2 (two) times daily. 08/30/24  Yes Craig Palma SAUNDERS, PA-C  spironolactone  (ALDACTONE ) 50 MG tablet Take 1 tablet (50 mg total) by mouth daily. 08/30/24  Yes Craig Palma R, PA-C  thiamine  (VITAMIN B1) 100 MG tablet Take 1 tablet (100 mg total) by mouth daily. 08/16/24  Yes Singh, Prashant K, MD  potassium chloride  SA (KLOR-CON  M) 20 MEQ tablet Take 1 tablet (20 mEq total) by mouth daily for 2 days. 08/21/24 08/31/24  Federico Rosario BROCKS, MD     Kyng Matlock Toma, MD PGY-2, ICU resident 09/13/24, 7:45 AM

## 2024-09-13 NOTE — Progress Notes (Signed)
 PHARMACY NOTE:  ANTIMICROBIAL RENAL DOSAGE ADJUSTMENT  Current antimicrobial regimen includes a mismatch between antimicrobial dosage and estimated renal function.  As per policy approved by the Pharmacy & Therapeutics and Medical Executive Committees, the antimicrobial dosage will be adjusted accordingly.  Current antimicrobial dosage:  Merrem1g IV q8  Indication: bacteremia  Renal Function:  Estimated Creatinine Clearance: 37.9 mL/min (A) (by C-G formula based on SCr of 1.14 mg/dL (H)). []      On intermittent HD, scheduled: []      On CRRT    Antimicrobial dosage has been changed to:  Merrem  1g IV q12  Additional comments: CRRT stopped AM 9/25   Sergio Batch, PharmD, [place], AAHIVP, CPP Infectious Disease Pharmacist 09/13/2024 9:32 AM

## 2024-09-13 NOTE — Progress Notes (Signed)
   Inpatient Rehab Admissions Coordinator :  Per therapy recommendations patient was screened for CIR candidacy by Heron Leavell RN MSN.  Bed Level assessment due to CRRT lines.The CIR admissions team will follow and monitor for progress and place a Rehab Consult order if felt to be appropriate. Please contact me with any questions.  Heron Leavell RN MSN Admissions Coordinator [phone]

## 2024-09-13 NOTE — Progress Notes (Signed)
 Patient ID: Rachel Lucas, female   DOB: 12-30-1960, 63 y.o.   MRN: [id number]    Progress Note   Subjective   Day # 10 CC; decompensated EtOH related cirrhosis/acute alcoholic hepatitis nonresponsive to steroids, septic shock with DIC resolving, acute renal failure Patient was last seen by GI on 09/09/2024  Off CRRT  Patient was extubated yesterday, now off pressors Tube feedings via core track She is alert, communicating, but encephalopathic, asking repeatedly to go home.  Friend at bedside, Dr. Albertus had long telephone conversation with patient's husband this afternoon  MELD 3.0: 33 at 09/13/2024  3:24 PM MELD-Na: 31 at 09/13/2024  3:24 PM Calculated from: Serum Creatinine: 1.59 mg/dL at 0/74/7974  6:75 PM Serum Sodium: 138 mmol/L (Using max of 137 mmol/L) at 09/13/2024  3:24 PM Total Bilirubin: 14.9 mg/dL at 0/74/7974  5:81 AM Serum Albumin : 4.7 g/dL (Using max of 3.5 g/dL) at 0/74/7974  6:75 PM INR(ratio): 2.5 at 09/13/2024  4:18 AM Age at listing (hypothetical): 62 years Sex: Female at 09/13/2024  3:24 PM     Objective   Vital signs in last 24 hours: Temp:  [97.4 F (36.3 C)-99.7 F (37.6 C)] 97.4 F (36.3 C) (09/25 1522) Pulse Rate:  [105-125] 105 (09/25 1600) Resp:  [10-29] 20 (09/25 1600) BP: (76-118)/(53-84) 110/69 (09/25 1600) SpO2:  [92 %-100 %] 97 % (09/25 1600) Arterial Line BP: (91-150)/(38-79) 139/60 (09/25 1600) Weight:  [45.8 kg] 45.8 kg (09/25 0500) Last BM Date : 09/13/24 General:    Acute and chronically ill-appearing older white female , deeply jaundiced Heart:  Regular rate and rhythm; no murmurs Lungs: Respirations even and unlabored, lungs CTA bilaterally Abdomen:  Soft, protuberant, nontense ascites, nontender no palpable mass or hepatosplenomegaly. Normal bowel sounds.  Rectal pouch in place Extremities:  Without edema.  Left lower extremity wrapped, upper extremities wrapped Neurologic:  Alert and oriented to person, month and year, mentation  slow Psych:  Cooperative. Normal mood and affect.  Intake/Output from previous day: 09/24 0701 - 09/25 0700 In: 1913.3 [I.V.:47.2; NG/GT:1180; IV Piggyback:686.2] Out: 3456.1 [Urine:33; Stool:475] Intake/Output this shift: Total I/O In: 414.4 [I.V.:4.4; NG/GT:410] Out: 830 [Stool:650]  Lab Results: Recent Labs    09/11/24 0335 09/11/24 0339 09/12/24 0333 09/13/24 0418  WBC 28.3*  --  35.2* 30.8*  HGB 8.2* 8.8* 9.5* 10.0*  HCT 23.6* 26.0* 27.0* 28.4*  PLT 29*  --  38* 31*   BMET Recent Labs    09/12/24 1523 09/13/24 0418 09/13/24 1524  NA 139 137 138  K 4.0 3.7 4.0  CL 103 104 103  CO2 22 21* 21*  GLUCOSE 144* 165* 160*  BUN 24* 27* 46*  CREATININE 1.00 1.14* 1.59*  CALCIUM  9.3 9.6 9.9   LFT Recent Labs    09/13/24 0418 09/13/24 1524  PROT 8.1  --   ALBUMIN  4.9  4.9 4.7  AST 89*  --   ALT 60*  --   ALKPHOS 171*  --   BILITOT 14.9*  --   BILIDIR 5.7*  --   IBILI 9.2*  --    PT/INR Recent Labs    09/12/24 0333 09/13/24 0418  LABPROT 33.0* 28.4*  INR 3.1* 2.5*       Assessment / Plan:    #7 63 year old white female with new diagnosis of decompensated EtOH induced cirrhosis made about 6 weeks ago during initial hospitalization, at which time she was also diagnosed with acute EtOH hepatitis and met criteria for steroids. She did not have  improvement in Lilly score with prednisolone  and steroids were discontinued. Current MEL D NA=31-consistent with severe disease and high risk of mortality She has had some mild improvement in coagulopathy Patient has been abstinent for about 6 weeks  #2 acute hepatic encephalopathy/metabolic encephalopathy/ICU delirium-on folate and thiamine  Rifaximin  stopped due to concerns for possible drug-induced skin injury Continue high-dose lactulose  30 cc 3 times daily  #3 septic shock secondary to gram-negative bacteremia/cellulitis Currently off pressors On meropenem  Leukocytosis persists Infectious disease  following  #4 acute kidney injury-possible component of HRS-off CRRT as of today Nephrology following  #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

## 2024-09-13 NOTE — Progress Notes (Signed)
 [facility]  Date of Admission:  09/03/2024      Lines: 9/17-c right internal jugular hd catheter 9/15-c right femoral triple lumen 9/15-c right femoral arterial line  9/16-c urethral catheter  Other: 9/15 - c stress dose hydrocortisone   Abx: 9/21-c ceftriaxone  9/21-c metronidazole   9/15-21 piptazo 9/15-19 vanc 9/15-22 rifaximin   ASSESSMENT: 63 yo female newly dx'ed alcoholic cirrhosis (presuming all alcohol), recent admission 07/2024 for klebsiella bacteremia, readmitted 09/03/24 with septic shock, in setting of acute 1 day left leg swelling/redness and geographic bullae/erythematous macule  9/15 bcx gnr on anaerobic bottle only not able to result by bcid  9/22 the left leg had vascular surgery input and no acute vascular occlusion determinted. The rash had fairly looked the same. There are purpura on the arms. She remains with multiorgan failure (aki requiring crrt), septic shock on pressor, and with dic like pictures vs decompensated cirrhosis   Clinically she doesn't behave like sjs, which primary team had thought of. I am concerned this is all rather related to decompensated cirrhosis with hrs1. Renal is also following along   She has 7 days of abx already and we plan 10 days of abx treatment total pending further sensitivity testing for the gnr    Reviewed other data: Temporal bone imaging mastoid effusion without bony abnormality incidental and unclear significance. On abx and that would cover acute mastoiditis but I do not have reason to believe she has mastoiditis  ----------------- 09/11/24 assessment Off pressors Pulm/ccm hopeful for extubation Rash lle blister coming off on some, otherwise stable; upper ext purpuric (discussed with nursing staff who has been patient for at least the last 2 days  Wbc up Anemic/thrombocytopenic still  Hydrocortisone  being tapered off   9/24 id assessment Extubated  No complaint of  pain or sensory deficit in the legs Appears to mentate well Wbc still rising  Gnr culture sent out to [facility] for identification  Concern we are missing the gnr coverage and that she might not be more horifically ill due to cirrhosis Remains on crrt  No sign of cdiff    9/25 id assessment Wbc count down Stable in terms of hemodynamics and mentation Crrt turned off today  Not clear if wbc improvement due to a few days removed from last dose of corticosteroid or meropenem  (latter rather too soon)   PLAN: Finish 7 days meropenem   F/u bcx report from [facility] Supportive care per renal/pulm ccm Maintain standard isolation precaution Discussed with primary team    Principal Problem:   Septic shock (HCC) Active Problems:   Hepatorenal syndrome (HCC)   AKI (acute kidney injury)   Allergies  Allergen Reactions   Levofloxacin Other (See Comments)   Sulfonamide Derivatives Palpitations    Scheduled Meds:  Chlorhexidine  Gluconate Cloth  6 each Topical Once per day on Monday Tuesday Wednesday Thursday Friday Saturday   feeding supplement (PROSource TF20)  60 mL Per Tube BID   folic acid   1 mg Per Tube Daily   insulin  aspart  0-9 Units Subcutaneous Q4H   lactulose   20 g Per Tube TID   levothyroxine   88 mcg Per Tube QAC breakfast   multivitamin  1 tablet Per Tube QHS   ofloxacin   5 drop Both EARS Daily   mouth rinse  15 mL Mouth Rinse Q2H   pantoprazole  (PROTONIX ) IV  40 mg Intravenous Q12H   sodium chloride  flush  10-40 mL Intracatheter Q12H   thiamine   100 mg Per Tube Daily   Continuous Infusions:  feeding supplement (VITAL 1.5 CAL) 40 mL/hr at 09/13/24 1600   meropenem  (MERREM ) IV 1 g (09/13/24 1606)   norepinephrine  (LEVOPHED ) Adult infusion Stopped (09/13/24 1150)   PRN Meds:.artificial tears, diphenhydrAMINE , heparin , melatonin, OLANZapine , ondansetron  (ZOFRAN ) IV, mouth rinse, [START ON 09/15/2024] pneumococcal 20-valent conjugate vaccine, sodium chloride   flush   SUBJECTIVE: Wbc improving afebrile Hds Wants to go home No complaint  Review of Systems: ROS All other ROS was negative, except mentioned above     OBJECTIVE: Vitals:   09/13/24 1530 09/13/24 1537 09/13/24 1545 09/13/24 1600  BP:    110/69  Pulse: (!) 106 (!) 105 (!) 107 (!) 105  Resp: (!) 22 18 17 20   Temp:      TempSrc:      SpO2: 98% 98% 98% 97%  Weight:      Height:       Body mass index is 17.89 kg/m.  Physical Exam General/constitutional: chronically ill appearing, conversant, apperas to be oriented and alert/cooperative HEENT: jaundice CV: tachy, no mrg Lungs: clear on vent Abd: Soft Ext: no LE edema; left leg wrapped and reviewed picture Skin: see pictures Neuro: sedated/intubated MSK: no peripheral joint swelling   Central line presence: right internal jugular hd line, and right groin a line and central line site no purulence   Lab Results Lab Results  Component Value Date   WBC 30.8 (H) 09/13/2024   HGB 10.0 (L) 09/13/2024   HCT 28.4 (L) 09/13/2024   MCV 100.4 (H) 09/13/2024   PLT 31 (L) 09/13/2024    Lab Results  Component Value Date   CREATININE 1.59 (H) 09/13/2024   BUN 46 (H) 09/13/2024   NA 138 09/13/2024   K 4.0 09/13/2024   CL 103 09/13/2024   CO2 21 (L) 09/13/2024    Lab Results  Component Value Date   ALT 60 (H) 09/13/2024   AST 89 (H) 09/13/2024   ALKPHOS 171 (H) 09/13/2024   BILITOT 14.9 (H) 09/13/2024      Microbiology: Recent Results (from the past 240 hours)  Blood culture (routine x 2)     Status: None   Collection Time: 09/03/24  9:30 PM   Specimen: BLOOD  Result Value Ref Range Status   Specimen Description BLOOD SITE NOT SPECIFIED  Final   Special Requests   Final    BOTTLES DRAWN AEROBIC AND ANAEROBIC Blood Culture results may not be optimal due to an inadequate volume of blood received in culture bottles   Culture  Setup Time   Final    GRAM NEGATIVE RODS ANAEROBIC BOTTLE ONLY CRITICAL VALUE  NOTED.  VALUE IS CONSISTENT WITH PREVIOUSLY REPORTED AND CALLED VALUE.    Culture   Final    GRAM NEGATIVE RODS PREVIOS CULTURE SENT TO [facility] FOR ID AND SESNI Performed at [facility], [address] [place], [place] [address]    Report Status 09/12/2024 FINAL  Final  Blood culture (routine x 2)     Status: None (Preliminary result)   Collection Time: 09/03/24  9:30 PM   Specimen: BLOOD  Result Value Ref Range Status   Specimen Description BLOOD SITE NOT SPECIFIED  Final   Special Requests   Final    BOTTLES DRAWN AEROBIC AND ANAEROBIC Blood Culture results may not be optimal due to an inadequate volume of blood received in culture bottles   Culture  Setup Time   Final    GRAM NEGATIVE RODS  ANAEROBIC BOTTLE ONLY CRITICAL RESULT CALLED TO, READ BACK BY AND VERIFIED WITH: PHARMD BLAKE WANNARAT ON 09/05/24 @ 1525 BY DRT    Culture   Final    GRAM NEGATIVE RODS Sent to [facility] for further susceptibility testing. Performed at [facility], [address] [place], [place] [address]    Report Status PENDING  Incomplete  Blood Culture ID Panel (Reflexed)     Status: None   Collection Time: 09/03/24  9:30 PM  Result Value Ref Range Status   Enterococcus faecalis NOT DETECTED NOT DETECTED Final   Enterococcus Faecium NOT DETECTED NOT DETECTED Final   Listeria monocytogenes NOT DETECTED NOT DETECTED Final   Staphylococcus species NOT DETECTED NOT DETECTED Final   Staphylococcus aureus (BCID) NOT DETECTED NOT DETECTED Final   Staphylococcus epidermidis NOT DETECTED NOT DETECTED Final   Staphylococcus lugdunensis NOT DETECTED NOT DETECTED Final   Streptococcus species NOT DETECTED NOT DETECTED Final   Streptococcus agalactiae NOT DETECTED NOT DETECTED Final   Streptococcus pneumoniae NOT DETECTED NOT DETECTED Final   Streptococcus pyogenes NOT DETECTED NOT DETECTED Final   A.calcoaceticus-baumannii NOT DETECTED NOT DETECTED Final   Bacteroides fragilis NOT DETECTED NOT  DETECTED Final   Enterobacterales NOT DETECTED NOT DETECTED Final   Enterobacter cloacae complex NOT DETECTED NOT DETECTED Final   Escherichia coli NOT DETECTED NOT DETECTED Final   Klebsiella aerogenes NOT DETECTED NOT DETECTED Final   Klebsiella oxytoca NOT DETECTED NOT DETECTED Final   Klebsiella pneumoniae NOT DETECTED NOT DETECTED Final   Proteus species NOT DETECTED NOT DETECTED Final   Salmonella species NOT DETECTED NOT DETECTED Final   Serratia marcescens NOT DETECTED NOT DETECTED Final   Haemophilus influenzae NOT DETECTED NOT DETECTED Final   Neisseria meningitidis NOT DETECTED NOT DETECTED Final   Pseudomonas aeruginosa NOT DETECTED NOT DETECTED Final   Stenotrophomonas maltophilia NOT DETECTED NOT DETECTED Final   Candida albicans NOT DETECTED NOT DETECTED Final   Candida auris NOT DETECTED NOT DETECTED Final   Candida glabrata NOT DETECTED NOT DETECTED Final   Candida krusei NOT DETECTED NOT DETECTED Final   Candida parapsilosis NOT DETECTED NOT DETECTED Final   Candida tropicalis NOT DETECTED NOT DETECTED Final   Cryptococcus neoformans/gattii NOT DETECTED NOT DETECTED Final    Comment: Performed at [facility], [address] [place], [place] [address]  Respiratory (~20 pathogens) panel by PCR     Status: None   Collection Time: 09/03/24  9:40 PM   Specimen: Line, Central; Respiratory  Result Value Ref Range Status   Adenovirus NOT DETECTED NOT DETECTED Final   Coronavirus 229E NOT DETECTED NOT DETECTED Final    Comment: (NOTE) The Coronavirus on the Respiratory Panel, DOES NOT test for the novel  Coronavirus (2019 nCoV)    Coronavirus HKU1 NOT DETECTED NOT DETECTED Final   Coronavirus NL63 NOT DETECTED NOT DETECTED Final   Coronavirus OC43 NOT DETECTED NOT DETECTED Final   Metapneumovirus NOT DETECTED NOT DETECTED Final   Rhinovirus / Enterovirus NOT DETECTED NOT DETECTED Final   Influenza A NOT DETECTED NOT DETECTED Final   Influenza B NOT DETECTED NOT  DETECTED Final   Parainfluenza Virus 1 NOT DETECTED NOT DETECTED Final   Parainfluenza Virus 2 NOT DETECTED NOT DETECTED Final   Parainfluenza Virus 3 NOT DETECTED NOT DETECTED Final   Parainfluenza Virus 4 NOT DETECTED NOT DETECTED Final   Respiratory Syncytial Virus NOT DETECTED NOT DETECTED Final   Bordetella pertussis NOT DETECTED NOT DETECTED Final  Bordetella Parapertussis NOT DETECTED NOT DETECTED Final   Chlamydophila pneumoniae NOT DETECTED NOT DETECTED Final   Mycoplasma pneumoniae NOT DETECTED NOT DETECTED Final    Comment: Performed at [facility], [address] [place], [place] [address]  Resp panel by RT-PCR (RSV, Flu A&B, Covid) Line, Central     Status: None   Collection Time: 09/03/24  9:40 PM   Specimen: Line, Central; Nasal Swab  Result Value Ref Range Status   SARS Coronavirus 2 by RT PCR NEGATIVE NEGATIVE Final   Influenza A by PCR NEGATIVE NEGATIVE Final   Influenza B by PCR NEGATIVE NEGATIVE Final    Comment: (NOTE) The Xpert Xpress SARS-CoV-2/FLU/RSV plus assay is intended as an aid in the diagnosis of influenza from Nasopharyngeal swab specimens and should not be used as a sole basis for treatment. Nasal washings and aspirates are unacceptable for Xpert Xpress SARS-CoV-2/FLU/RSV testing.  Fact Sheet for Patients: BloggerCourse.com  Fact Sheet for Healthcare Providers: SeriousBroker.it  This test is not yet approved or cleared by the [place]  FDA and has been authorized for detection and/or diagnosis of SARS-CoV-2 by FDA under an Emergency Use Authorization (EUA). This EUA will remain in effect (meaning this test can be used) for the duration of the COVID-19 declaration under Section 564(b)(1) of the Act, 21 U.S.C. section 360bbb-3(b)(1), unless the authorization is terminated or revoked.     Resp Syncytial Virus by PCR NEGATIVE NEGATIVE Final    Comment: (NOTE) Fact Sheet for  Patients: BloggerCourse.com  Fact Sheet for Healthcare Providers: SeriousBroker.it  This test is not yet approved or cleared by the [place]  FDA and has been authorized for detection and/or diagnosis of SARS-CoV-2 by FDA under an Emergency Use Authorization (EUA). This EUA will remain in effect (meaning this test can be used) for the duration of the COVID-19 declaration under Section 564(b)(1) of the Act, 21 U.S.C. section 360bbb-3(b)(1), unless the authorization is terminated or revoked.  Performed at [facility], [address] [place], [place] [address]   Body fluid culture w Gram Stain     Status: None   Collection Time: 09/06/24  4:28 PM   Specimen: Peritoneal Washings; Pleural Fluid  Result Value Ref Range Status   Specimen Description PERITONEAL  Final   Special Requests NONE  Final   Gram Stain NO WBC SEEN NO ORGANISMS SEEN   Final   Culture   Final    NO GROWTH 3 DAYS Performed at [facility], [address] [place], [place] [address]    Report Status 09/10/2024 FINAL  Final  Fungus Culture With Stain     Status: None (Preliminary result)   Collection Time: 09/06/24  5:00 PM   Specimen: Pleural Fluid  Result Value Ref Range Status   Fungus Stain Final report  Final    Comment: (NOTE) Performed At: [facility] [address] [place], [place] [id number] Jennette Shorter MD Ey:[id number]    Fungus (Mycology) Culture PENDING  Incomplete   Fungal Source PERITONEAL  Final    Comment: Performed at [facility], [address], [place], [place] [address]  Fungus Culture Result     Status: None   Collection Time: 09/06/24  5:00 PM  Result Value Ref Range Status   Result 1 Comment  Final    Comment: (NOTE) KOH/Calcofluor preparation:  no fungus observed. Performed At: [facility] [address] [place], [place] [id number] Jennette Shorter MD Ey:[id number]   Culture, blood  (  Routine X 2) w Reflex to ID Panel     Status: None   Collection Time: 09/07/24  4:13 PM   Specimen: BLOOD  Result Value Ref Range Status   Specimen Description BLOOD FOOT  Final   Special Requests   Final    AEROBIC BOTTLE ONLY Blood Culture results may not be optimal due to an inadequate volume of blood received in culture bottles   Culture   Final    NO GROWTH 5 DAYS Performed at [facility], [address] [place], [place] [address]    Report Status 09/12/2024 FINAL  Final  Culture, blood (Routine X 2) w Reflex to ID Panel     Status: None   Collection Time: 09/07/24  4:13 PM   Specimen: BLOOD  Result Value Ref Range Status   Specimen Description BLOOD FOOT  Final   Special Requests   Final    AEROBIC BOTTLE ONLY Blood Culture results may not be optimal due to an inadequate volume of blood received in culture bottles   Culture   Final    NO GROWTH 5 DAYS Performed at [facility], [address] [place], [place] [address]    Report Status 09/12/2024 FINAL  Final     Serology:   Imaging: If present, new imagings (plain films, ct scans, and mri) have been personally visualized and interpreted; radiology reports have been reviewed. Decision making incorporated into the Impression / Recommendations.  9/22 cxr 1. Significant decrease in diffuse bilateral interstitial opacities. 2. Persistant dense left retrocardiac opacity. 3. Small left pleural effusion.  9/20 ct abd pelv 1. No acute inflammatory process identified within the abdomen or pelvis. 2. There is cirrhotic liver configuration. There is moderate ascites. No walled-off abscess or pneumoperitoneum. 3. There is small left and small-to-moderate right pleural effusion with associated compressive atelectatic changes in the bilateral lower lobes. There are additional diffuse patchy ground-glass opacities in the middle lobe and bilateral lower lobes. Findings are nonspecific but new since the prior  study. Differential diagnosis includes multilobar pneumonia, pulmonary edema, ARDS, atypical pneumonia, hypersensitivity pneumonitis, etc. 4. Multiple other nonacute observations, as described above   9/20 ct temporal bones 1. Improved aeration of the left middle ear cavity and unchanged left mastoid effusion. No bone erosion. 2. New small right mastoid effusion.   9/15 ct left lower ext No acute fracture or erosive changes are noted.   Considerable subcutaneous edema without definitive abscess.   Incidental note is made of fatty infiltration of the medial head of the gastrocnemius.   Constance ONEIDA Passer, MD [facility] [phone] pager    09/13/2024, 4:51 PM

## 2024-09-13 NOTE — Progress Notes (Signed)
 Admit: 09/03/2024 LOS: 10  28F anuric AKI cardiac arrest, septic shock, GNR bacteremia, decompensated cirrhosis, ARDS  Current CRRT Prescription: Start Date: 9/17 Catheter: Nontunneled IJ BFR: 275 Pre Blood Pump: 300 400 4K DFR: 1200 4K Replacement Rate: 400 4K Goal UF: -50 mL/h Anticoagulation: None Clotting: Infrequent  S: Extubated On very low dose NE No UOP Remains on CRRT -1.5 L yesterday A.m. labs with K 3.7, phosphorus 2.6   O: 09/24 0701 - 09/25 0700 In: 1913.3 [I.V.:47.2; NG/GT:1180; IV Piggyback:686.2] Out: 3456.1 [Urine:33; Stool:475]  Filed Weights   09/11/24 0500 09/12/24 0500 09/13/24 0500  Weight: 52.7 kg 48 kg 45.8 kg    Recent Labs  Lab 09/12/24 0333 09/12/24 0808 09/12/24 1523 09/13/24 0418  NA 141 138 139 137  K 3.2* 4.2 4.0 3.7  CL 106 106 103 104  CO2 24 23 22  21*  GLUCOSE 147* 135* 144* 165*  BUN 24* 24* 24* 27*  CREATININE 1.15* 1.17* 1.00 1.14*  CALCIUM  8.9 9.0 9.3 9.6  PHOS 2.3*  --  3.0 2.6   Recent Labs  Lab 09/09/24 0941 09/10/24 0408 09/11/24 0335 09/11/24 0339 09/12/24 0333 09/13/24 0418  WBC 18.2*   < > 28.3*  --  35.2* 30.8*  NEUTROABS 16.2*  --   --   --   --  26.1*  HGB 7.2*   < > 8.2* 8.8* 9.5* 10.0*  HCT 21.1*   < > 23.6* 26.0* 27.0* 28.4*  MCV 101.4*   < > 100.4*  --  100.4* 100.4*  PLT 24*   < > 29*  --  38* 31*   < > = values in this interval not displayed.    Scheduled Meds:  Chlorhexidine  Gluconate Cloth  6 each Topical Once per day on Monday Tuesday Wednesday Thursday Friday Saturday   feeding supplement (PROSource TF20)  60 mL Per Tube BID   folic acid   1 mg Per Tube Daily   insulin  aspart  0-9 Units Subcutaneous Q4H   lactulose   20 g Per Tube TID   levothyroxine   88 mcg Per Tube QAC breakfast   multivitamin  1 tablet Per Tube QHS   ofloxacin   5 drop Both EARS Daily   mouth rinse  15 mL Mouth Rinse Q2H   pantoprazole  (PROTONIX ) IV  40 mg Intravenous Q12H   sodium chloride  flush  10-40 mL  Intracatheter Q12H   thiamine   100 mg Per Tube Daily   Continuous Infusions:  diltiazem  (CARDIZEM ) infusion Stopped (09/12/24 0409)   feeding supplement (VITAL 1.5 CAL) 40 mL/hr at 09/13/24 1100   meropenem  (MERREM ) IV     norepinephrine  (LEVOPHED ) Adult infusion 2 mcg/min (09/13/24 1100)   PRN Meds:.artificial tears, diphenhydrAMINE , heparin , melatonin, OLANZapine , ondansetron  (ZOFRAN ) IV, mouth rinse, [START ON 09/12/2024] pneumococcal 20-valent conjugate vaccine, sodium chloride  flush  ABG    Component Value Date/Time   PHART 7.469 (H) 09/11/2024 0339   PCO2ART 36.3 09/11/2024 0339   PO2ART 110 (H) 09/11/2024 0339   HCO3 26.4 09/11/2024 0339   TCO2 27 09/11/2024 0339   ACIDBASEDEF 1.0 09/07/2024 0500   O2SAT 99 09/11/2024 0339   Chronically ill-appearing, jaundiced On ventilator, ET tube present Coarse breath sounds bilaterally Regular, normal S1 and S2 1-2+ lower extremity edema  A/P  Dialysis dependent anuric AKI from ATN on CRRT Cardiac arrest PEA 9/16 Septic shock with previous GNR bacteremia on/off vasopressors, per CCM/ID AHRF/ARDS per CCM, extubated 9/24 Decompensated alcoholic cirrhosis with portal hypertension, variceal disease  Coagulopathy, thrombocytopenia, hematology following,  DIC favored, TTP unlikely Hypophosphatemia related to CRRT, should stabilize with discontinuation of CRRT Hypokalemia on 4K dialysate, monitor as become off dialysis Anemia  Hold CRRT today.  Likely transitioning to intermittent hemodialysis 9/26 or 9/27  Bernardino Gasman, MD [facility]

## 2024-09-14 DIAGNOSIS — G9341 Metabolic encephalopathy: Secondary | ICD-10-CM | POA: Diagnosis not present

## 2024-09-14 DIAGNOSIS — J9601 Acute respiratory failure with hypoxia: Secondary | ICD-10-CM | POA: Diagnosis not present

## 2024-09-14 DIAGNOSIS — K7460 Unspecified cirrhosis of liver: Secondary | ICD-10-CM

## 2024-09-14 DIAGNOSIS — N179 Acute kidney failure, unspecified: Secondary | ICD-10-CM | POA: Diagnosis not present

## 2024-09-14 DIAGNOSIS — R7881 Bacteremia: Secondary | ICD-10-CM | POA: Diagnosis not present

## 2024-09-14 DIAGNOSIS — A4150 Gram-negative sepsis, unspecified: Secondary | ICD-10-CM | POA: Diagnosis not present

## 2024-09-14 DIAGNOSIS — A419 Sepsis, unspecified organism: Secondary | ICD-10-CM | POA: Diagnosis not present

## 2024-09-14 DIAGNOSIS — K7682 Hepatic encephalopathy: Secondary | ICD-10-CM | POA: Diagnosis not present

## 2024-09-14 DIAGNOSIS — R6521 Severe sepsis with septic shock: Secondary | ICD-10-CM | POA: Diagnosis not present

## 2024-09-14 DIAGNOSIS — K7030 Alcoholic cirrhosis of liver without ascites: Secondary | ICD-10-CM | POA: Diagnosis not present

## 2024-09-14 DIAGNOSIS — Z992 Dependence on renal dialysis: Secondary | ICD-10-CM

## 2024-09-14 DIAGNOSIS — K7010 Alcoholic hepatitis without ascites: Secondary | ICD-10-CM | POA: Diagnosis not present

## 2024-09-14 DIAGNOSIS — K767 Hepatorenal syndrome: Secondary | ICD-10-CM | POA: Diagnosis not present

## 2024-09-14 DIAGNOSIS — E43 Unspecified severe protein-calorie malnutrition: Secondary | ICD-10-CM | POA: Insufficient documentation

## 2024-09-14 LAB — COMPREHENSIVE METABOLIC PANEL WITH GFR
ALT: 50 U/L — ABNORMAL HIGH (ref 0–44)
AST: 79 U/L — ABNORMAL HIGH (ref 15–41)
Albumin: 4.3 g/dL (ref 3.5–5.0)
Alkaline Phosphatase: 203 U/L — ABNORMAL HIGH (ref 38–126)
Anion gap: 20 — ABNORMAL HIGH (ref 5–15)
BUN: 91 mg/dL — ABNORMAL HIGH (ref 6–20)
CO2: 19 mmol/L — ABNORMAL LOW (ref 22–32)
Calcium: 10.4 mg/dL — ABNORMAL HIGH (ref 8.9–10.3)
Chloride: 103 mmol/L (ref 98–111)
Creatinine, Ser: 2.73 mg/dL — ABNORMAL HIGH (ref 0.44–1.00)
GFR, Estimated: 19 mL/min — ABNORMAL LOW (ref >60)
Glucose, Bld: 139 mg/dL — ABNORMAL HIGH (ref 70–99)
Potassium: 4 mmol/L (ref 3.5–5.1)
Sodium: 142 mmol/L (ref 135–145)
Total Bilirubin: 11.8 mg/dL — ABNORMAL HIGH (ref 0.0–1.2)
Total Protein: 7.3 g/dL (ref 6.5–8.1)

## 2024-09-14 LAB — RENAL FUNCTION PANEL
Albumin: 4.4 g/dL (ref 3.5–5.0)
Albumin: 6.5 g/dL — ABNORMAL HIGH (ref 3.5–5.0)
Anion gap: 18 — ABNORMAL HIGH (ref 5–15)
Anion gap: 17 — ABNORMAL HIGH (ref 5–15)
BUN: 89 mg/dL — ABNORMAL HIGH (ref 6–20)
BUN: 25 mg/dL — ABNORMAL HIGH (ref 6–20)
CO2: 18 mmol/L — ABNORMAL LOW (ref 22–32)
CO2: 24 mmol/L (ref 22–32)
Calcium: 10.4 mg/dL — ABNORMAL HIGH (ref 8.9–10.3)
Calcium: 10.4 mg/dL — ABNORMAL HIGH (ref 8.9–10.3)
Chloride: 104 mmol/L (ref 98–111)
Chloride: 96 mmol/L — ABNORMAL LOW (ref 98–111)
Creatinine, Ser: 2.82 mg/dL — ABNORMAL HIGH (ref 0.44–1.00)
Creatinine, Ser: 1.01 mg/dL — ABNORMAL HIGH (ref 0.44–1.00)
GFR, Estimated: 19 mL/min — ABNORMAL LOW (ref >60)
GFR, Estimated: >60 mL/min (ref >60)
Glucose, Bld: 136 mg/dL — ABNORMAL HIGH (ref 70–99)
Glucose, Bld: 166 mg/dL — ABNORMAL HIGH (ref 70–99)
Phosphorus: 5.8 mg/dL — ABNORMAL HIGH (ref 2.5–4.6)
Phosphorus: 1.9 mg/dL — ABNORMAL LOW (ref 2.5–4.6)
Potassium: 4 mmol/L (ref 3.5–5.1)
Potassium: 3.1 mmol/L — ABNORMAL LOW (ref 3.5–5.1)
Sodium: 140 mmol/L (ref 135–145)
Sodium: 137 mmol/L (ref 135–145)

## 2024-09-14 LAB — AMMONIA: Ammonia: 49 umol/L — ABNORMAL HIGH (ref 9–35)

## 2024-09-14 LAB — GLUCOSE, CAPILLARY
Glucose-Capillary: 105 mg/dL — ABNORMAL HIGH (ref 70–99)
Glucose-Capillary: 106 mg/dL — ABNORMAL HIGH (ref 70–99)
Glucose-Capillary: 121 mg/dL — ABNORMAL HIGH (ref 70–99)
Glucose-Capillary: 121 mg/dL — ABNORMAL HIGH (ref 70–99)
Glucose-Capillary: 121 mg/dL — ABNORMAL HIGH (ref 70–99)
Glucose-Capillary: 126 mg/dL — ABNORMAL HIGH (ref 70–99)
Glucose-Capillary: 127 mg/dL — ABNORMAL HIGH (ref 70–99)

## 2024-09-14 LAB — CBC
HCT: 27.1 % — ABNORMAL LOW (ref 36.0–46.0)
Hemoglobin: 9.5 g/dL — ABNORMAL LOW (ref 12.0–15.0)
MCH: 35.6 pg — ABNORMAL HIGH (ref 26.0–34.0)
MCHC: 35.1 g/dL (ref 30.0–36.0)
MCV: 101.5 fL — ABNORMAL HIGH (ref 80.0–100.0)
Platelets: 39 K/uL — ABNORMAL LOW (ref 150–400)
RBC: 2.67 MIL/uL — ABNORMAL LOW (ref 3.87–5.11)
RDW: 24.9 % — ABNORMAL HIGH (ref 11.5–15.5)
WBC: 35.8 K/uL — ABNORMAL HIGH (ref 4.0–10.5)
nRBC: 0.2 % (ref 0.0–0.2)

## 2024-09-14 LAB — ADAMTS13 ACTIVITY: Adamts 13 Activity: 23.1 % — CL (ref >66.8)

## 2024-09-14 LAB — ADAMTS13 ANTIBODY: ADAMTS13 Antibody: 3 U/mL (ref <12)

## 2024-09-14 LAB — CALCIUM, IONIZED
Calcium, Ionized, Serum: 5 mg/dL (ref 4.5–5.6)
Calcium, Ionized, Serum: 5.2 mg/dL (ref 4.5–5.6)

## 2024-09-14 LAB — HEPATITIS B SURFACE ANTIGEN: Hepatitis B Surface Ag: NONREACTIVE

## 2024-09-14 MED ORDER — ALBUMIN HUMAN 25 % IV SOLN
25.0000 g | Freq: Once | INTRAVENOUS | Status: AC
  Administered 2024-09-14: 25 g via INTRAVENOUS
  Filled 2024-09-14: qty 100

## 2024-09-14 MED ORDER — SODIUM CHLORIDE 0.9 % IV SOLN
1.0000 g | INTRAVENOUS | Status: DC
  Administered 2024-09-14 – 2024-09-15 (×2): 1 g via INTRAVENOUS
  Filled 2024-09-14 (×2): qty 20

## 2024-09-14 MED ORDER — CHLORHEXIDINE GLUCONATE CLOTH 2 % EX PADS
6.0000 | MEDICATED_PAD | Freq: Every day | CUTANEOUS | Status: DC
  Administered 2024-09-14 – 2024-09-16 (×3): 6 via TOPICAL

## 2024-09-14 MED ORDER — ORAL CARE MOUTH RINSE
15.0000 mL | OROMUCOSAL | Status: DC
  Administered 2024-09-14 (×3): 15 mL via OROMUCOSAL

## 2024-09-14 NOTE — Progress Notes (Signed)
 Patient ID: ALAIJAH GIBLER, female   DOB: [birth year], 63 y.o.   MRN: [id number]    Progress Note   Subjective   Day # 11 CC; decompensated EtOH related cirrhosis/acute alcoholic hepatitis nonresponsive to steroids/septic shock with DIC resolving/acute renal failure  Day 7 of 10 antibiotics-currently on meropenem    Due to bump in creatinine today patient undergoing dialysis Hypotensive with dialysis and started back on pressor Patient is alert, remains encephalopathic, oriented to year and month, speech very slow difficult to interpret.  She denies any pain Long discussion with patient's husband.  Labs-ammonia 49 Sodium 142/potassium 4.0/BUN 91/creatinine 2.73 T. bili 11.8/alk phos 203/AST 79/ALT 50 No INR today WBC 35.8/hemoglobin 9.5/crit 27.1/platelets 39   Objective   Vital signs in last 24 hours: Temp:  [97.3 F (36.3 C)-98.3 F (36.8 C)] 98.3 F (36.8 C) (09/26 1117) Pulse Rate:  [99-113] 106 (09/26 1200) Resp:  [14-27] 24 (09/26 1200) BP: (99-110)/(64-69) 99/64 (09/26 1200) SpO2:  [96 %-100 %] 97 % (09/26 1200) Arterial Line BP: (116-143)/(44-67) 123/55 (09/26 1200) Weight:  [44.7 kg] 44.7 kg (09/26 0500) Last BM Date : 09/14/24 General:   Very ill-appearing there are white female , deeply icteric, alert, speech very quiet and slow Heart:  Regular rate and rhythm; no murmurs Lungs: Respirations even and unlabored, lungs CTA bilaterally Abdomen:  Soft, nontense ascites,. Normal bowel sounds. Extremities: Upper extremities wrapped, left lower extremity wrapped Neurologic:  Alert oriented x 3, no definite asterixis, speech very slow Psych:  Cooperative. Normal mood and affect.  Intake/Output from previous day: 09/25 0701 - 09/26 0700 In: 1309.9 [I.V.:4.4; NG/GT:1100; IV Piggyback:205.5] Out: 1480 [Stool:1300] Intake/Output this shift: Total I/O In: 260 [NG/GT:260] Out: 200 [Stool:200]  Lab Results: Recent Labs    09/12/24 0333 09/13/24 0418  09/14/24 0516  WBC 35.2* 30.8* 35.8*  HGB 9.5* 10.0* 9.5*  HCT 27.0* 28.4* 27.1*  PLT 38* 31* 39*   BMET Recent Labs    09/13/24 0418 09/13/24 1524 09/14/24 0516  NA 137 138 142  140  K 3.7 4.0 4.0  4.0  CL 104 103 103  104  CO2 21* 21* 19*  18*  GLUCOSE 165* 160* 139*  136*  BUN 27* 46* 91*  89*  CREATININE 1.14* 1.59* 2.73*  2.82*  CALCIUM  9.6 9.9 10.4*  10.4*   LFT Recent Labs    09/13/24 0418 09/13/24 1524 09/14/24 0516  PROT 8.1  --  7.3  ALBUMIN  4.9  4.9   < > 4.3  4.4  AST 89*  --  79*  ALT 60*  --  50*  ALKPHOS 171*  --  203*  BILITOT 14.9*  --  11.8*  BILIDIR 5.7*  --   --   IBILI 9.2*  --   --    < > = values in this interval not displayed.   PT/INR Recent Labs    09/12/24 0333 09/13/24 0418  LABPROT 33.0* 28.4*  INR 3.1* 2.5*        Assessment / Plan:    #61 63 year old white female with new diagnosis of decompensated EtOH induced cirrhosis made about 6 weeks ago during initial hospitalization, also diagnosed with acute EtOH hepatitis at that time/met criteria for steroids but was steroid nonresponsive and stopped after 7 days. Current MEL D N/A=31-consistent with severe disease and high risk of mortality She has had some mild improvement in coagulopathy  Abstinent x 6 weeks  #2 acute hepatic encephalopathy/metabolic encephalopathy/ICU delirium-continues on folate and thiamine  Xifaxan  stopped  due to concerns for drug-induced skin injury Continue lactulose  30 cc 3 times daily  #3 septic shock secondary to gram-negative bacteremia/cellulitis Continues on meropenem  markedLeukocytosis persists ID following   #4 acute kidney injury possible component of HRS-rising creatinine over the past 24 hours, receiving dialysis today  #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

## 2024-09-14 NOTE — Progress Notes (Signed)
 PHARMACY NOTE:  ANTIMICROBIAL RENAL DOSAGE ADJUSTMENT  Current antimicrobial regimen includes a mismatch between antimicrobial dosage and estimated renal function.  As per policy approved by the Pharmacy & Therapeutics and Medical Executive Committees, the antimicrobial dosage will be adjusted accordingly.  Current antimicrobial dosage:  Merrem1g IV q8  Indication: bacteremia  Renal Function:  Estimated Creatinine Clearance: 15 mL/min (A) (by C-G formula based on SCr of 2.82 mg/dL (H)). [x]      On intermittent HD, scheduled: []      On CRRT    Antimicrobial dosage has been changed to:  Merrem  1g IV q24  Additional comments: CRRT stopped AM 9/25. Plan for [facility], PharmD, BCIDP, AAHIVP, CPP Infectious Disease Pharmacist 09/14/2024 9:02 AM

## 2024-09-14 NOTE — TOC Progression Note (Signed)
 Transition of Care [facility]) - Progression Note    Patient Details  Name: Rachel Lucas MRN: [id number] Date of Birth: 09-30-1961  Transition of Care [facility]) CM/SW Contact  Tom-Johnson, Keedan Sample Daphne, RN Phone Number: 09/14/2024, 3:33 PM  Clinical Narrative:     CRRT stopped yesterday 09/13/24, off Pressors. CIR following for potential admit.   CM will continue to follow a patient progresses with care towards discharge.                     Expected Discharge Plan and Services                                               Social Drivers of Health (SDOH) Interventions SDOH Screenings   Food Insecurity: No Food Insecurity (09/03/2024)  Housing: Low Risk  (09/03/2024)  Transportation Needs: No Transportation Needs (09/03/2024)  Utilities: Not At Risk (09/03/2024)  Depression (PHQ2-9): Low Risk  (08/31/2024)  Social Connections: Patient Declined (09/03/2024)  Tobacco Use: Medium Risk (09/04/2024)    Readmission Risk Interventions    09/04/2024   12:52 PM 08/13/2024    5:06 PM  Readmission Risk Prevention Plan  Transportation Screening Complete Complete  PCP or Specialist Appt within 5-7 Days  Complete  Home Care Screening  Complete  Medication Review (RN CM)  Referral to Pharmacy  Medication Review (RN Care Manager) Referral to Pharmacy   PCP or Specialist appointment within 3-5 days of discharge Complete   HRI or Home Care Consult Complete   SW Recovery Care/Counseling Consult Complete   Palliative Care Screening Not Applicable   Skilled Nursing Facility Not Applicable

## 2024-09-14 NOTE — Consult Note (Addendum)
 WOC Nurse Consult Note: Reason for Consult: requested to assess bulla and hematomas on LLE, L foot. Wound type: septic shock, cellulitis on L leg, poor circulation, alcoholic cirrhosis, thrombocytopenia.  Followed by vascular team, pt is in HD. Altered blood work results. Pressure Injury POA: NA Measurement: See flowsheet. Wound bed: 100% red, peeling skin, ecchymosis. Drainage (amount, consistency, odor) Minimum amount, serosanguinous. Periwound: hematoma, peeling skin.  Dressing procedure/placement/frequency: Last consult with WOC team 09/19 is still an appropriate recommendation.  Cleanse with NS, par dry. Cover intact and open Bullae with Xeroform, top with abd pad and wrap with Kerlix from ankle to mid-thigh.  Avoid placing tape directly on skin (tape over dressing only). Off load pressure to bilateral heels in Prevalon Boots (lawson # [id number])  Discussed with Dr. Toma about wound care.  WOC team will not plan to follow further. Please reconsult if further assistance is needed. Thank-you,  Lela Holm RN, CNS, [employer], MSN.  (Phone [phone])

## 2024-09-14 NOTE — Progress Notes (Signed)
 Speech Language Pathology Treatment: Dysphagia  Patient Details Name: Rachel Lucas MRN: [id number] DOB: 10-05-1961 Today's Date: 09/14/2024 Time: 8688-8670 SLP Time Calculation (min) (ACUTE ONLY): 18 min  Assessment / Plan / Recommendation Clinical Impression  RN reports pt rested briefly this AM after being awake for >36 hours. She is alert but requires cueing for initiation. Aphonia is persistent and she cannot expectorate secretions after weak, wet coughing. Oral holding is noted with ice chips, thin liquids, and purees before she initiates a swallow, which is followed by coughing. Discussed plan to prioritize sleep hygiene and ice chips/tspn sips of water  over the weekend with goal of proceeding with FEES next week. Ice chips or tspn sips of water  are meant to promote pharyngeal hygiene, preserve swallow function, and provide comfort. Minimal volumes of plain ice/water  (neutral pH and free of bacteria) is not expected to be harmful to the pt even if some moisture is aspirated. Discussed with pt, her husband, and Charity fundraiser. SLP will f/u to assess readiness.    HPI HPI: Patient is a 63 y.o. female with PMH: recent diagnosis of alcoholic cirrhosis with septic shock thought to be secondary to cellulitis. She presented to the hospital on 09/03/24 due to concern for single episode of melena  the day prior to admission but subsequently with normal stools. In ED, patient was unresponsive to fluids with BP 60/30's. She went into PEA arrest on 9/16 for 6 minutes with ROSC, NG tube in place. She was intubated after PEA and ultimately extubated on 9/24. She failed [facility] with RN and SLP swallow evaluation ordered.      SLP Plan  FEES          Recommendations  Diet recommendations: NPO Medication Administration: Via alternative means                  Oral care QID;Oral care prior to ice chip/H20   Frequent or constant Supervision/Assistance Dysphagia, unspecified (R13.10)     FEES      Damien Blumenthal, M.A., CCC-SLP Speech Language Pathology, Acute Rehabilitation Services  Secure Chat preferred [phone]   09/14/2024, 1:46 PM

## 2024-09-14 NOTE — Progress Notes (Signed)
 [facility]  Date of Admission:  09/03/2024      Lines: 9/17-c right internal jugular hd catheter 9/15-c right femoral triple lumen 9/15-c right femoral arterial line  9/16-c urethral catheter  Other: 9/15 - c stress dose hydrocortisone   Abx: 9/24-c meropenem   9/21-24 ceftriaxone  9/21-24 metronidazole   9/15-21 piptazo 9/15-19 vanc 9/15-22 rifaximin   ASSESSMENT: 63 yo female newly dx'ed alcoholic cirrhosis (presuming all alcohol), recent admission 07/2024 for klebsiella bacteremia, readmitted 09/03/24 with septic shock, in setting of acute 1 day left leg swelling/redness and geographic bullae/erythematous macule  9/15 bcx gnr on anaerobic bottle only not able to result by bcid  9/22 the left leg had vascular surgery input and no acute vascular occlusion determinted. The rash had fairly looked the same. There are purpura on the arms. She remains with multiorgan failure (aki requiring crrt), septic shock on pressor, and with dic like pictures vs decompensated cirrhosis   Clinically she doesn't behave like sjs, which primary team had thought of. I am concerned this is all rather related to decompensated cirrhosis with hrs1. Renal is also following along   She has 7 days of abx already and we plan 10 days of abx treatment total pending further sensitivity testing for the gnr    Reviewed other data: Temporal bone imaging mastoid effusion without bony abnormality incidental and unclear significance. On abx and that would cover acute mastoiditis but I do not have reason to believe she has mastoiditis  ----------------- 09/11/24 assessment Off pressors Pulm/ccm hopeful for extubation Rash lle blister coming off on some, otherwise stable; upper ext purpuric (discussed with nursing staff who has been patient for at least the last 2 days  Wbc up Anemic/thrombocytopenic still  Hydrocortisone  being tapered off   9/24 id  assessment Extubated  No complaint of pain or sensory deficit in the legs Appears to mentate well Wbc still rising  Gnr culture sent out to [facility] for identification  Concern we are missing the gnr coverage and that she might not be more horifically ill due to cirrhosis Remains on crrt  No sign of cdiff    9/25 id assessment Wbc count down Stable in terms of hemodynamics and mentation Crrt turned off today  Not clear if wbc improvement due to a few days removed from last dose of corticosteroid or meropenem  (latter rather too soon)   9/26 id assessment Crrt stopped but remains pretty much anuric and needing iHD Wbc up again Afebrile Mentation stable  Lft remains high with very elevated bilirubin    Prognosis overall remain guarded  PLAN: Finish 7 days meropenem  on 09/04/2024 ID will f/u bcx report from [facility] and adjust accordingly Supportive care per renal/pulm ccm Will sign off for now Maintain standard isolation precaution Discussed with primary team    Principal Problem:   Septic shock (HCC) Active Problems:   Hepatorenal syndrome (HCC)   AKI (acute kidney injury)   Severe malnutrition   Encephalopathy, hepatic (HCC)   Protein-calorie malnutrition, severe   Allergies  Allergen Reactions   Levofloxacin Other (See Comments)   Sulfonamide Derivatives Palpitations    Scheduled Meds:  Chlorhexidine  Gluconate Cloth  6 each Topical Q0600   feeding supplement (PROSource TF20)  60 mL Per Tube BID   folic acid   1 mg Per Tube Daily   insulin  aspart  0-9 Units Subcutaneous Q4H   lactulose   20 g Per Tube TID   levothyroxine   88 mcg Per Tube  QAC breakfast   multivitamin  1 tablet Per Tube QHS   ofloxacin   5 drop Both EARS Daily   mouth rinse  15 mL Mouth Rinse Q4H   pantoprazole  (PROTONIX ) IV  40 mg Intravenous Q12H   sodium chloride  flush  10-40 mL Intracatheter Q12H   thiamine   100 mg Per Tube Daily   Continuous Infusions:  feeding supplement (VITAL  1.5 CAL) 40 mL/hr at 09/14/24 1200   meropenem  (MERREM ) IV     norepinephrine  (LEVOPHED ) Adult infusion Stopped (09/13/24 1150)   PRN Meds:.artificial tears, diphenhydrAMINE , heparin , melatonin, OLANZapine , ondansetron  (ZOFRAN ) IV, mouth rinse, [START ON 09/10/2024] pneumococcal 20-valent conjugate vaccine, sodium chloride  flush   SUBJECTIVE: To get iHD Afebrile Mentating stable   Review of Systems: ROS All other ROS was negative, except mentioned above     OBJECTIVE: Vitals:   09/14/24 1000 09/14/24 1100 09/14/24 1117 09/14/24 1200  BP:    99/64  Pulse: (!) 105 (!) 107  (!) 106  Resp: (!) 24 (!) 25  (!) 24  Temp:   98.3 F (36.8 C)   TempSrc:   Axillary   SpO2: 96% 97%  97%  Weight:      Height:       Body mass index is 17.46 kg/m.  Physical Exam General/constitutional: chronically ill appearing, conversant, apperas to be oriented and alert/cooperative HEENT: jaundice CV: tachy, no mrg Lungs: clear on vent Abd: Soft Neuro: extubated; conversing; appropriate; strength symmetric generalized weakness MSK: no peripheral joint swelling  Lab Results Lab Results  Component Value Date   WBC 35.8 (H) 09/14/2024   HGB 9.5 (L) 09/14/2024   HCT 27.1 (L) 09/14/2024   MCV 101.5 (H) 09/14/2024   PLT 39 (L) 09/14/2024    Lab Results  Component Value Date   CREATININE 2.82 (H) 09/14/2024   CREATININE 2.73 (H) 09/14/2024   BUN 89 (H) 09/14/2024   BUN 91 (H) 09/14/2024   NA 140 09/14/2024   NA 142 09/14/2024   K 4.0 09/14/2024   K 4.0 09/14/2024   CL 104 09/14/2024   CL 103 09/14/2024   CO2 18 (L) 09/14/2024   CO2 19 (L) 09/14/2024    Lab Results  Component Value Date   ALT 50 (H) 09/14/2024   AST 79 (H) 09/14/2024   ALKPHOS 203 (H) 09/14/2024   BILITOT 11.8 (H) 09/14/2024      Microbiology: Recent Results (from the past 240 hours)  Body fluid culture w Gram Stain     Status: None   Collection Time: 09/06/24  4:28 PM   Specimen: Peritoneal Washings;  Pleural Fluid  Result Value Ref Range Status   Specimen Description PERITONEAL  Final   Special Requests NONE  Final   Gram Stain NO WBC SEEN NO ORGANISMS SEEN   Final   Culture   Final    NO GROWTH 3 DAYS Performed at [facility], [address], [place], [place] [address]    Report Status 09/10/2024 FINAL  Final  Fungus Culture With Stain     Status: None (Preliminary result)   Collection Time: 09/06/24  5:00 PM   Specimen: Pleural Fluid  Result Value Ref Range Status   Fungus Stain Final report  Final    Comment: (NOTE) Performed At: [facility] [address] [place], [place] [id number] Jennette Shorter MD Ey:[id number]    Fungus (Mycology) Culture PENDING  Incomplete   Fungal Source PERITONEAL  Final    Comment: Performed at [facility]  Lab, [address], [place], [place] [address]  Fungus Culture Result     Status: None   Collection Time: 09/06/24  5:00 PM  Result Value Ref Range Status   Result 1 Comment  Final    Comment: (NOTE) KOH/Calcofluor preparation:  no fungus observed. Performed At: [facility] [address] [place], [place] [id number] Jennette Shorter MD Ey:[id number]   Culture, blood (Routine X 2) w Reflex to ID Panel     Status: None   Collection Time: 09/07/24  4:13 PM   Specimen: BLOOD  Result Value Ref Range Status   Specimen Description BLOOD FOOT  Final   Special Requests   Final    AEROBIC BOTTLE ONLY Blood Culture results may not be optimal due to an inadequate volume of blood received in culture bottles   Culture   Final    NO GROWTH 5 DAYS Performed at [facility], [address] [place], [place] [address]    Report Status 09/12/2024 FINAL  Final  Culture, blood (Routine X 2) w Reflex to ID Panel     Status: None   Collection Time: 09/07/24  4:13 PM   Specimen: BLOOD  Result Value Ref Range Status   Specimen Description BLOOD FOOT  Final   Special Requests   Final    AEROBIC BOTTLE ONLY Blood Culture  results may not be optimal due to an inadequate volume of blood received in culture bottles   Culture   Final    NO GROWTH 5 DAYS Performed at [facility], [address] [place], [place] [address]    Report Status 09/12/2024 FINAL  Final     Serology:   Imaging: If present, new imagings (plain films, ct scans, and mri) have been personally visualized and interpreted; radiology reports have been reviewed. Decision making incorporated into the Impression / Recommendations.  9/22 cxr 1. Significant decrease in diffuse bilateral interstitial opacities. 2. Persistant dense left retrocardiac opacity. 3. Small left pleural effusion.  9/20 ct abd pelv 1. No acute inflammatory process identified within the abdomen or pelvis. 2. There is cirrhotic liver configuration. There is moderate ascites. No walled-off abscess or pneumoperitoneum. 3. There is small left and small-to-moderate right pleural effusion with associated compressive atelectatic changes in the bilateral lower lobes. There are additional diffuse patchy ground-glass opacities in the middle lobe and bilateral lower lobes. Findings are nonspecific but new since the prior study. Differential diagnosis includes multilobar pneumonia, pulmonary edema, ARDS, atypical pneumonia, hypersensitivity pneumonitis, etc. 4. Multiple other nonacute observations, as described above   9/20 ct temporal bones 1. Improved aeration of the left middle ear cavity and unchanged left mastoid effusion. No bone erosion. 2. New small right mastoid effusion.   9/15 ct left lower ext No acute fracture or erosive changes are noted.   Considerable subcutaneous edema without definitive abscess.   Incidental note is made of fatty infiltration of the medial head of the gastrocnemius.   Constance ONEIDA Passer, MD [facility] [phone] pager    09/14/2024, 1:13 PM

## 2024-09-14 NOTE — Consult Note (Signed)
 Subjective: Patient is a 63 year old female with history of alcoholic cirrhosis who presented to the emergency room on 09/03/2024 for swelling of her left lower extremity.  Patient was found to be in septic shock which was thought to be from left lower extremity cellulitis.  Patient was admitted to the ICU for further care.  Plastic surgery has been consulted for management of the left lower extremity wound.  Patient's husband is present at bedside.  He reports that he started when patient had severe swelling of her left lower extremity a few weeks ago.  Objective: Vital signs in last 24 hours: Temp:  [96.1 F (35.6 C)-98.6 F (37 C)] 96.1 F (35.6 C) (09/26 1545) Pulse Rate:  [93-112] 104 (09/26 1630) Resp:  [15-32] 24 (09/26 1630) BP: (61-111)/(46-74) 95/70 (09/26 1630) SpO2:  [93 %-99 %] 99 % (09/26 1630) Arterial Line BP: (116-140)/(44-64) 123/55 (09/26 1200) Weight:  [44.7 kg] 44.7 kg (09/26 1330) Last BM Date : 09/14/24  Intake/Output from previous day: 09/25 0701 - 09/26 0700 In: 1309.9 [I.V.:4.4; NG/GT:1100; IV Piggyback:205.5] Out: 1480 [Stool:1300] Intake/Output this shift: Total I/O In: 505.1 [I.V.:10.1; NG/GT:420; IV Piggyback:75] Out: 200 [Stool:200]  VolumeGeneral appearance: appears older than stated age, distracted, and no distress Resp: no respiratory distress Reviewed the photos in the chart, large areas of superficial wounds to the majority of the left lower extremity.  There are purpleish/erythematous areas throughout the left lower extremity.  There is some blistering noted at the midfoot.  Lab Results:     Latest Ref Rng & Units 09/14/2024    5:16 AM 09/13/2024    4:18 AM 09/12/2024    3:33 AM  CBC  WBC 4.0 - 10.5 K/uL 35.8  30.8  35.2   Hemoglobin 12.0 - 15.0 g/dL 9.5  89.9  9.5   Hematocrit 36.0 - 46.0 % 27.1  28.4  27.0   Platelets 150 - 400 K/uL 39  31  38     BMET Recent Labs    09/13/24 1524 09/14/24 0516  NA 138 142  140  K 4.0 4.0  4.0   CL 103 103  104  CO2 21* 19*  18*  GLUCOSE 160* 139*  136*  BUN 46* 91*  89*  CREATININE 1.59* 2.73*  2.82*  CALCIUM  9.9 10.4*  10.4*   PT/INR Recent Labs    09/12/24 0333 09/13/24 0418  LABPROT 33.0* 28.4*  INR 3.1* 2.5*   ABG No results for input(s): PHART, HCO3 in the last 72 hours.  Invalid input(s): PCO2, PO2  Studies/Results: No results found.  Anti-infectives: Anti-infectives (From admission, onward)    Start     Dose/Rate Route Frequency Ordered Stop   09/14/24 2200  meropenem  (MERREM ) 1 g in sodium chloride  0.9 % 100 mL IVPB        1 g 200 mL/hr over 30 Minutes Intravenous Every 24 hours 09/14/24 0902 08/30/2024 2359   09/13/24 1700  meropenem  (MERREM ) 1 g in sodium chloride  0.9 % 100 mL IVPB  Status:  Discontinued        1 g 200 mL/hr over 30 Minutes Intravenous Every 12 hours 09/13/24 0925 09/14/24 0902   09/13/24 0100  meropenem  (MERREM ) 1 g in sodium chloride  0.9 % 100 mL IVPB  Status:  Discontinued        1 g 200 mL/hr over 30 Minutes Intravenous Every 12 hours 09/12/24 1402 09/12/24 1548   09/12/24 2200  meropenem  (MERREM ) 1 g in sodium chloride  0.9 % 100 mL IVPB  Status:  Discontinued        1 g 200 mL/hr over 30 Minutes Intravenous Every 8 hours 09/12/24 1548 09/13/24 0925   09/12/24 0945  meropenem  (MERREM ) 1 g in sodium chloride  0.9 % 100 mL IVPB  Status:  Discontinued        1 g 200 mL/hr over 30 Minutes Intravenous Every 8 hours 09/12/24 0858 09/12/24 1402   09/09/24 1400  cefTRIAXone  (ROCEPHIN ) 2 g in sodium chloride  0.9 % 100 mL IVPB  Status:  Discontinued        2 g 200 mL/hr over 30 Minutes Intravenous Every 24 hours 09/09/24 1006 09/12/24 0857   09/09/24 1100  metroNIDAZOLE  (FLAGYL ) IVPB 500 mg  Status:  Discontinued        500 mg 100 mL/hr over 60 Minutes Intravenous Every 12 hours 09/09/24 1006 09/12/24 0857   09/06/24 1215  vancomycin  (VANCOCIN ) IVPB 1000 mg/200 mL premix  Status:  Discontinued        1,000 mg 200 mL/hr over  60 Minutes Intravenous Every 24 hours 09/06/24 1127 09/07/24 1144   09/05/24 1000  rifaximin  (XIFAXAN ) tablet 550 mg  Status:  Discontinued        550 mg Per Tube 2 times daily 09/05/24 0813 09/10/24 1209   09/05/24 0900  vancomycin  (VANCOREADY) IVPB 1500 mg/300 mL        1,500 mg 150 mL/hr over 120 Minutes Intravenous  Once 09/05/24 0804 09/05/24 1049   09/05/24 0804  vancomycin  variable dose per unstable renal function (pharmacist dosing)  Status:  Discontinued         Does not apply See admin instructions 09/05/24 0804 09/05/24 1542   09/04/24 1230  rifaximin  (XIFAXAN ) tablet 550 mg  Status:  Discontinued        550 mg Oral 2 times daily 09/04/24 1132 09/05/24 0813   09/04/24 0000  piperacillin -tazobactam (ZOSYN ) IVPB 3.375 g  Status:  Discontinued       Placed in Followed by Linked Group   3.375 g 12.5 mL/hr over 240 Minutes Intravenous Every 8 hours 09/03/24 1648 09/09/24 1006   09/03/24 1800  linezolid  (ZYVOX ) IVPB 600 mg  Status:  Discontinued        600 mg 300 mL/hr over 60 Minutes Intravenous Every 12 hours 09/03/24 1643 09/05/24 0758   09/03/24 1700  piperacillin -tazobactam (ZOSYN ) IVPB 3.375 g       Placed in Followed by Linked Group   3.375 g 100 mL/hr over 30 Minutes Intravenous  Once 09/03/24 1648 09/03/24 2302   09/03/24 1615  cefTRIAXone  (ROCEPHIN ) 2 g in sodium chloride  0.9 % 100 mL IVPB  Status:  Discontinued        2 g 200 mL/hr over 30 Minutes Intravenous  Once 09/03/24 1609 09/03/24 1641       Assessment/Plan: s/p  Plan: - Recommend to clean the wound with Vashe 2x daily and apply Xeroform over the wound daily for now. -Plastics will continue to follow along -Discussed plan with Dr. Lowery.  LOS: 11 days    Estefana FORBES Peck, PA-C 09/14/2024

## 2024-09-14 NOTE — Progress Notes (Signed)
   09/14/24 1735  Vitals  Temp (!) 97.1 F (36.2 C)  Pulse Rate (!) 101  Resp (!) 23  BP 109/68  SpO2 99 %  Post Treatment  Dialyzer Clearance Clear  Hemodialysis Intake (mL) 300 mL  Liters Processed 72.9  Fluid Removed (mL) 1100 mL  Tolerated HD Treatment Yes  Post-Hemodialysis Comments TOLERATED WITH MEDICATIONS   Received patient in bed to unit.  Alert Informed consent signed and in chart.   TX duration:3.5HRS  Patient tolerated well.   Alert, without acute distress.  Hand-off given to patient's nurse.   Access used: [facility] Access issues: NONE  Total UF removed: 1.1L Medication(s) given: ALBUMIN , LEVO   [facility]

## 2024-09-14 NOTE — Progress Notes (Signed)
 NAME:  Rachel Lucas, MRN:  [id number], DOB:  [birth year], LOS: 11 ADMISSION DATE:  09/03/2024, CONSULTATION DATE:  09/05/2024 REFERRING MD:  EDP, CHIEF COMPLAINT:  Left leg pain   History of Present Illness:  Patient is a 63 yo female with new diagnosis of alcoholic cirrhosis with septic shock thought 2/2 LLE cellulitis, does have persistent otitis media from recent prior admission for Klebsiella pneumonia and bacteremia.    Patient had concern for single episode of melena the day prior to admission, but subsequently had normal stools.  Had no cough, congestion, or hematemesis.  Did endorse worsening LLE swelling and redness with some pain the day of admission.   Recently, patient was admitted for Klebciella bacteremia and pneumonia in setting of accidental ear canal perforation with Q-tip (8/20-8/27).   In the ED, patient was unresponsive to fluids with BP 60/30s and PCCM was consulted for admission.  Patient stated that her baseline SBP is in the 90s.   Pertinent  Medical History  Fam hx of VWD but test 8/25 neg Alcoholic cirrhosis recent new diagnosis Last alcoholic drink 08/07/2024   Significant Hospital Events: Including procedures, antibiotic start and stop dates in addition to other pertinent events   9/15 - Admitted, working dx LE cellulitis and SBP in setting of nex dx of ETOH related cirrhosis.  started Zosyn  (9/15-) and linezolid  (9/15-), GI consulted with no concern for GIB, CVC and Art line placed in PM 9/16 - Worsening delirium, PEA arrest for 6 minutes with ROSC, NG tube in place 9/17 - EEG neg for seizure. ECHO: EF 35-40%. Became hyperglycemic after switch to D10 fluids. Had minimal urine output at 50 mL. Nephro consulted for CRRT. HD cath placed.  CRRT started. IR consulted. Felt not stable for para. Stating: coagulopathy, shock and hypothermia as factors. 9/18 - Lactic acid improving. Paracentesis, negative for SBP.  Vascular surgery consulted for cold LLE, no  interventions indicated, no evidence of limb ischemia 9/19 - ID consulted, added inflammatory marker eval raising concern for vasculitis. Pressors down significantly after starting the epinephrine . Cortrak placed. For intolerance of TF, inflammatory eval: sed rate 4, lactate 6.6->4.6. tbili 17 (rising from 11.9). wbc still elevated. Hgb 7.5 to 7. Plts 49->43 9/20 - hemodynamics a little better. Still w/ sig WBC ct. Prob steroids but repeating abd US  and CT imaging of head negative to r/o mastoiditis. 9/21 - All CT imaging including CT abdomen pelvis as well as CT temporal bone all reassuring no new evidence of infection.  Ultrasound abdomen negative.  Platelets down again, sending additional eval to rule out TTP.  Getting more platelets transfused.  Change abx to CTX and metronidazole . 9/22 - Off vasopressin , minimal sedation but remains encephalopathic. 9/23 - Off pressors, more alert and following instructions.  Weaning from vent. 9/24 - Overnight agitation and required norepi briefly but weaned and extubated in the AM, changed from CTX and metronidazole  to meropenem . 9/25 - Stable off ventilator, intermittent pressor requirements, discontinuing CRRT.   Interim History / Subjective:  This AM, patient is alert and oriented to self and place, not situation.  She has not slept overnight and according to family friend at bedside has been mildly confused.  She is not requiring pressor support and oxygenation is stable on room air.   Objective    Blood pressure 104/68, pulse 99, temperature 97.7 F (36.5 C), temperature source Axillary, resp. rate (!) 25, height 5' 3 (1.6 m), weight 44.7 kg, SpO2 99%. CVP:  [0 mmHg-14 mmHg]  0 mmHg      Intake/Output Summary (Last 24 hours) at 09/14/2024 0718 Last data filed at 09/14/2024 0700 Gross per 24 hour  Intake 1309.92 ml  Output 1480 ml  Net -170.08 ml   Filed Weights   09/12/24 0500 09/13/24 0500 09/14/24 0500  Weight: 48 kg 45.8 kg 44.7 kg     Examination: General: Chronically ill-appearing and deconditioned, resting comfortably in bed, NAD. HEENT: NCAT.  Mildly dry mucous membranes.  Scleral icterus.  Soft, quiet voice. Cardiovascular: Regular rate and rhythm. Normal S1/S2. No murmurs, rubs, or gallops appreciated. 2+ radial pulses. Pulmonary: Clear bilaterally to ascultation. No wheezes, crackles, or rhonchi. No increased WOB, no accessory muscle usage on room air. Abdominal: No tenderness to deep or light palpation. No rebound or guarding, mildly distended. No HSM. Skin: Warm and dry.  Jaundiced. Extremities: Trace peripheral edema bilaterally.  LLE with gauze dressing over areas of bullae and violaceous discoloration.  Capillary refill 2 seconds. Neuro: Alert to self, place.  Able to follow instructions.   Resolved problem list  Septic shock Acute hypoxic respiratory failure 2/2 acute lung injury (extubated 9/24)   Assessment and Plan   Severe sepsis 2/2 GNR bacteremia further complicated by acute systolic heart failure WBC again increased (30.8>35.8) but remains stable off pressors >24 hours.  BCx 9/15 with GNRs and poor growth, repeat BCx 9/19 with NGTD.  Now on day 3 of meropenem  and ID plans for 7 days total. Plan: - Stress dose steroids tapered off 9/23 - Cultures from 9/15 reincubated for better growth, solely GNRs noted - Meropenem  (9/24>), day 3 of 7 (last day 9/30) - s/p Zosyn  (9/15-9/21), CTX (9/21>9/24), metronidazole  (9/21>9/24) - No GDMT given sepsis - PPI daily - Now stable for transfer to lower level of care, will sign out to [facility] today   LLE bullae and mottling Possible this is secondary to cellulitis versus volume overload, now remains stable.  Considered SJS on 9/22 and discontinued rifaximin , low concern overall for this condition.  Neg for DVT on 9/17, felt exacerbated by hydrostatic pressure and volume overload. Evaluated by vascular on 9/18 and 9/19 with no concern for limb ischemia. No  eosinophilia on CBC w/ diff. Plan: - May ultimately require advanced wound care therapies if recovers from acute illness - Close pulse checks - Appreciate wound care assistance - Volume removal w/ CRRT continuing   Extubated 9/24 Dysphagia Now extubated and stable on room air.  SLP still recommending NPO given severe aspiration risk. Plan: - NPO - Meds per tube and IV - Oral care QID - SLP following - PT eval and treat   AKI 2/2 ischemic ATN post PEA arrest Hypokalemia and hypophosphatemia, intermittent Creatinine uptrending after discontinuation of CRRT 9/25 and minimal urine production (~10 mL). Plan: - Strict I&Os - Foley removed 9/24 - Renal panels BID - HD today, continue per Nephro   Decompensated alcoholic cirrhosis with ascites, Hx gastric varices, and superimposed shock liver Hepatic encephalopathy ICU delirium Encephalopathy improved, but remains agitated overnight.  1300 mL flatus pouch output past 24 hours.  Likely component of metabolic encephalopathy and ICU delirium given nighttime agitation. Plan: - Decreased lactulose  to 20 mg TID, follow with ammonia as needed - Rifaximin  discontinued as above given concern for SJS - Monitor frequency of BMs daily - Continue to reorient, delirium precautions - Holding home propranolol  and spiro in setting of sepsis - Minimize sedation as able - Per GI 9/25, if patient is able to recover and discharge,  Critical illness related protein calorie malnutrition Liquid stools Appreciate RD consult, post-pyloric Cortrack placed 9/20. Plan: - Advance trickle feeds via Cortrak as able - Stopped stool softeners as she is on lactulose  and think gut edema affecting absorption   Chronic macrocytic anemia Status post 1 unit PRBC 9/16 and 9/20. Hemoglobin now climbing. Plan: - Holding AC given coagulopathy - AM CBC - Transfusion threshold Hgb <7   Acute on chronic thrombocytopenia 2/2 sepsis superimposed on cirrhosis,  possible DIC Coagulopathy (septic coagulopathy/DIC superimposed on  decompensated cirrhosis) Plts 38>31 without evidence of frank bleeding.  Received platelets 9/17, again prior to paracentesis on 9/18, linezolid  stopped 9/18, again received platelets 9/21.  Also not a candidate for [facility] given coagulopathy.  Following labs for TTP.  Haptoglobin low and LDH elevated, suggesting some hemolysis. Plan: - Heme following, appreciate recs - Plt transfusion threshold <20 per Heme - If needs procedures, threshold for Plts >50 - Avoiding AC given coagulopathy and wounds with high risk for bleeding, unable to place SCDs due to LE wounds - f/u [id number] - Follow INR every 3 days, daily CBC   Hypothyroidism Stable chronic problem. Plan: - Continue levothyroxine  88 mcg   Glycemic control Now off steroids, mildly hyperglycemic.  Tube feeds continuing.  A1c 4.3. 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 Tobie with [facility] 9/16, who related that he has a no suspicion for mastoiditis after reviewing CT imaging.  Noted that patient has a packing wick in place and TM is open, so fluid would be expected but does not suspect mastoiditis based on radiography. Repeated imaging 9/20 improved aeration of middle ear cavity with no evidence of mastoiditis. Plan: - Ciprodex  drops changed to ofloxacin  (family request and cleared w/ ENT) both ears (started 9/20) - Monitor postauricular area for proptosis   GI PPx: Pantoprazole  BID DVT PPx: Heparin  held given thrombocytopenia, unable to place SCDs given LE wounds   Labs   CBC: Recent Labs  Lab 09/09/24 0941 09/10/24 0408 09/10/24 0916 09/11/24 0335 09/11/24 0339 09/12/24 0333 09/13/24 0418 09/14/24 0516  WBC 18.2* 22.5*  --  28.3*  --  35.2* 30.8* 35.8*  NEUTROABS 16.2*  --   --   --   --   --  26.1*  --   HGB 7.2* 7.7*   < > 8.2* 8.8* 9.5* 10.0*  9.5*  HCT 21.1* 22.6*   < > 23.6* 26.0* 27.0* 28.4* 27.1*  MCV 101.4* 100.9*  --  100.4*  --  100.4* 100.4* 101.5*  PLT 24* 45*  --  29*  --  38* 31* 39*   < > = values in this interval not displayed.    Basic Metabolic Panel: Recent Labs  Lab 09/11/24 0001 09/11/24 0335 09/11/24 0339 09/12/24 0333 09/12/24 0808 09/12/24 1523 09/13/24 0418 09/13/24 1524 09/14/24 0516  NA 141 141  141   < > 141 138 139 137 138 142  140  K 3.1* 3.1*  3.1*   < > 3.2* 4.2 4.0 3.7 4.0 4.0  4.0  CL 105 103  105   < > 106 106 103 104 103 103  104  CO2 23 23  23    < > 24 23 22  21* 21* 19*  18*  GLUCOSE 163* 150*  151*   < > 147* 135* 144* 165* 160* 139*  136*  BUN 23* 20  20   < > 24* 24* 24* 27* 46* 91*  89*  CREATININE 1.01* 1.03*  1.10*   < > 1.15* 1.17* 1.00 1.14* 1.59* 2.73*  2.82*  CALCIUM  8.9 9.0  9.0   < > 8.9 9.0 9.3 9.6 9.9 10.4*  10.4*  MG 2.2 2.4  --  2.4 2.5*  --  2.5*  --   --   PHOS  --  1.8*   < > 2.3*  --  3.0 2.6 3.7 5.8*   < > = values in this interval not displayed.   GFR: Estimated Creatinine Clearance: 15 mL/min (A) (by C-G formula based on SCr of 2.82 mg/dL (H)). Recent Labs  Lab 09/08/24 0316 09/09/24 0320 09/09/24 0941 09/11/24 0335 09/12/24 0333 09/13/24 0418 09/14/24 0516  WBC 22.6* 20.2*   < > 28.3* 35.2* 30.8* 35.8*  LATICACIDVEN 4.6* 2.4*  --   --   --   --   --    < > = values in this interval not displayed.    Liver Function Tests: Recent Labs  Lab 09/10/24 0408 09/10/24 1518 09/11/24 0335 09/11/24 1556 09/12/24 0333 09/12/24 1523 09/13/24 0418 09/13/24 1524 09/14/24 0516  AST 92*  --  84*  --  77*  --  89*  --  79*  ALT 50*  --  48*  --  50*  --  60*  --  50*  ALKPHOS 92  --  104  --  157*  --  171*  --  203*  BILITOT 18.5*  --  14.1*  --  13.0*  --  14.9*  --  11.8*  PROT 7.2  --  6.5  --  7.0  --  8.1  --  7.3  ALBUMIN  5.2*   < > 4.6  4.5   < > 4.5  4.4 4.9 4.9  4.9 4.7 4.3  4.4   < > = values in this interval not  displayed.   No results for input(s): LIPASE, AMYLASE in the last 168 hours. Recent Labs  Lab 09/11/24 0336  AMMONIA 64*    ABG    Component Value Date/Time   PHART 7.469 (H) 09/11/2024 0339   PCO2ART 36.3 09/11/2024 0339   PO2ART 110 (H) 09/11/2024 0339   HCO3 26.4 09/11/2024 0339   TCO2 27 09/11/2024 0339   ACIDBASEDEF 1.0 09/07/2024 0500   O2SAT 99 09/11/2024 0339     Coagulation Profile: Recent Labs  Lab 09/09/24 0320 09/10/24 0408 09/11/24 0335 09/12/24 0333 09/13/24 0418  INR 3.1* 2.9* 2.9* 3.1* 2.5*    Cardiac Enzymes: No results for input(s): CKTOTAL, CKMB, CKMBINDEX, TROPONINI in the last 168 hours.  HbA1C: Hgb A1c MFr Bld  Date/Time Value Ref Range Status  09/10/2024 04:08 AM 4.3 (L) 4.8 - 5.6 % Final    Comment:    (NOTE) Diagnosis of Diabetes The following HbA1c ranges recommended by the American Diabetes Association (ADA) may be used as an aid in the diagnosis of diabetes mellitus.  Hemoglobin             Suggested A1C NGSP%              Diagnosis  <5.7                   Non Diabetic  5.7-6.4                Pre-Diabetic  >6.4  Diabetic  <7.0                   Glycemic control for                       adults with diabetes.      CBG: Recent Labs  Lab 09/13/24 1142 09/13/24 1520 09/13/24 1953 09/13/24 2335 09/14/24 0349  GLUCAP 135* 148* 168* 127* 121*    Past Medical History:  She,  has a past medical history of Hypothyroidism, Thyroid  disease, and Von Willebrand disease (HCC).   Surgical History:   Past Surgical History:  Procedure Laterality Date   CYST REMOVAL NECK  1995   IR PARACENTESIS  09/06/2024     Social History:   reports that she quit smoking about 34 years ago. Her smoking use included cigarettes. She started smoking about 44 years ago. She has a 2 pack-year smoking history. She does not have any smokeless tobacco history on file. She reports that she does not currently use  alcohol. She reports that she does not use drugs.   Family History:  Her family history includes Asthma in her mother; Eczema in her mother; Heart Problems in her father.   Allergies Allergies  Allergen Reactions   Levofloxacin Other (See Comments)   Sulfonamide Derivatives Palpitations     Home Medications  Prior to Admission medications   Medication Sig Start Date End Date Taking? Authorizing Provider  folic acid  (FOLVITE ) 1 MG tablet Take 1 tablet (1 mg total) by mouth daily. 08/16/24  Yes Dennise Lavada POUR, MD  furosemide  (LASIX ) 20 MG tablet Take 1 tablet (20 mg total) by mouth daily. 08/30/24  Yes Craig Palma R, PA-C  lactulose , encephalopathy, (ENULOSE ) 10 GM/15ML SOLN Take 15 mLs (10 g total) by mouth 2 (two) times daily. 08/21/24  Yes Federico Rosario BROCKS, MD  levothyroxine  (SYNTHROID ) 88 MCG tablet Take 1 tablet (88 mcg total) by mouth daily before breakfast. 08/15/24  Yes Singh, Prashant K, MD  midodrine  (PROAMATINE ) 5 MG tablet Take 1 tablet (5 mg total) by mouth 2 (two) times daily with a meal. 08/30/24  Yes Craig Palma R, PA-C  propranolol  (INDERAL ) 10 MG tablet Take 1 tablet (10 mg total) by mouth 2 (two) times daily. 08/30/24  Yes Craig Palma SAUNDERS, PA-C  spironolactone  (ALDACTONE ) 50 MG tablet Take 1 tablet (50 mg total) by mouth daily. 08/30/24  Yes Craig Palma R, PA-C  thiamine  (VITAMIN B1) 100 MG tablet Take 1 tablet (100 mg total) by mouth daily. 08/16/24  Yes Singh, Prashant K, MD  potassium chloride  SA (KLOR-CON  M) 20 MEQ tablet Take 1 tablet (20 mEq total) by mouth daily for 2 days. 08/21/24 08/31/24  Federico Rosario BROCKS, MD     Dorian Duval Toma, MD PGY-2, ICU resident 09/14/24, 7:18 AM

## 2024-09-14 NOTE — Progress Notes (Signed)
 Admit: 09/03/2024 LOS: 11  39F anuric AKI cardiac arrest, septic shock, GNR bacteremia, decompensated cirrhosis, ARDS  S: Off pressors, awake, interactive   O: 09/25 0701 - 09/26 0700 In: 1309.9 [I.V.:4.4; NG/GT:1100; IV Piggyback:205.5] Out: 1480 [Stool:1300]  Filed Weights   09/12/24 0500 09/13/24 0500 09/14/24 0500  Weight: 48 kg 45.8 kg 44.7 kg    Recent Labs  Lab 09/13/24 0418 09/13/24 1524 09/14/24 0516  NA 137 138 142  140  K 3.7 4.0 4.0  4.0  CL 104 103 103  104  CO2 21* 21* 19*  18*  GLUCOSE 165* 160* 139*  136*  BUN 27* 46* 91*  89*  CREATININE 1.14* 1.59* 2.73*  2.82*  CALCIUM  9.6 9.9 10.4*  10.4*  PHOS 2.6 3.7 5.8*   Recent Labs  Lab 09/09/24 0941 09/10/24 0408 09/12/24 0333 09/13/24 0418 09/14/24 0516  WBC 18.2*   < > 35.2* 30.8* 35.8*  NEUTROABS 16.2*  --   --  26.1*  --   HGB 7.2*   < > 9.5* 10.0* 9.5*  HCT 21.1*   < > 27.0* 28.4* 27.1*  MCV 101.4*   < > 100.4* 100.4* 101.5*  PLT 24*   < > 38* 31* 39*   < > = values in this interval not displayed.    Scheduled Meds:  Chlorhexidine  Gluconate Cloth  6 each Topical Q0600   feeding supplement (PROSource TF20)  60 mL Per Tube BID   folic acid   1 mg Per Tube Daily   insulin  aspart  0-9 Units Subcutaneous Q4H   lactulose   20 g Per Tube TID   levothyroxine   88 mcg Per Tube QAC breakfast   multivitamin  1 tablet Per Tube QHS   ofloxacin   5 drop Both EARS Daily   mouth rinse  15 mL Mouth Rinse Q4H   pantoprazole  (PROTONIX ) IV  40 mg Intravenous Q12H   sodium chloride  flush  10-40 mL Intracatheter Q12H   thiamine   100 mg Per Tube Daily   Continuous Infusions:  feeding supplement (VITAL 1.5 CAL) 40 mL/hr at 09/14/24 0900   meropenem  (MERREM ) IV     norepinephrine  (LEVOPHED ) Adult infusion Stopped (09/13/24 1150)   PRN Meds:.artificial tears, diphenhydrAMINE , heparin , melatonin, OLANZapine , ondansetron  (ZOFRAN ) IV, mouth rinse, [START ON 08/25/2024] pneumococcal 20-valent conjugate vaccine,  sodium chloride  flush  ABG    Component Value Date/Time   PHART 7.469 (H) 09/11/2024 0339   PCO2ART 36.3 09/11/2024 0339   PO2ART 110 (H) 09/11/2024 0339   HCO3 26.4 09/11/2024 0339   TCO2 27 09/11/2024 0339   ACIDBASEDEF 1.0 09/07/2024 0500   O2SAT 99 09/11/2024 0339   Chronically ill-appearing, jaundiced Awake, alert Coarse breath sounds bilaterally Regular, normal S1 and S2 1-2+ lower extremity edema  A/P  Dialysis dependent anuric AKI from ATN on CRRT through 09/13/2024 Cardiac arrest PEA 9/16 Septic shock with previous GNR bacteremia off vasopressors,  AHRF/ARDS per CCM, extubated 9/24 Decompensated alcoholic cirrhosis with portal hypertension, variceal disease  Coagulopathy, thrombocytopenia, hematology following, DIC favored, TTP unlikely Hypophosphatemia related to CRRT, should stabilize with discontinuation of CRRT Hypokalemia stable now off CRRT anemia  Intermittent hemodialysis ordered for today  Bernardino Gasman, MD [facility]

## 2024-09-14 NOTE — Plan of Care (Signed)
 Consulted Dr. Lowery with Plastic Surgery for evaluation of wounds, in particular LLE.  On initial review of chart and photos, Dr. Lowery recommended Vashe cleanse twice daily, gently pat dry and cover with Xeroform dressing. -Wound care order adjusted to reflect this  Dr. Lowery will evaluate patient and follow to determine if further therapies are indicated.

## 2024-09-15 ENCOUNTER — Inpatient Hospital Stay (HOSPITAL_COMMUNITY)

## 2024-09-15 DIAGNOSIS — Z515 Encounter for palliative care: Secondary | ICD-10-CM | POA: Diagnosis not present

## 2024-09-15 DIAGNOSIS — Z7189 Other specified counseling: Secondary | ICD-10-CM

## 2024-09-15 DIAGNOSIS — R918 Other nonspecific abnormal finding of lung field: Secondary | ICD-10-CM

## 2024-09-15 DIAGNOSIS — K7682 Hepatic encephalopathy: Secondary | ICD-10-CM | POA: Diagnosis not present

## 2024-09-15 DIAGNOSIS — K7290 Hepatic failure, unspecified without coma: Secondary | ICD-10-CM | POA: Diagnosis not present

## 2024-09-15 LAB — RENAL FUNCTION PANEL
Albumin: 5.1 g/dL — ABNORMAL HIGH (ref 3.5–5.0)
Albumin: 4.9 g/dL (ref 3.5–5.0)
Anion gap: 17 — ABNORMAL HIGH (ref 5–15)
Anion gap: 23 — ABNORMAL HIGH (ref 5–15)
BUN: 61 mg/dL — ABNORMAL HIGH (ref 6–20)
BUN: 110 mg/dL — ABNORMAL HIGH (ref 6–20)
CO2: 25 mmol/L (ref 22–32)
CO2: 19 mmol/L — ABNORMAL LOW (ref 22–32)
Calcium: 10.5 mg/dL — ABNORMAL HIGH (ref 8.9–10.3)
Calcium: 10.8 mg/dL — ABNORMAL HIGH (ref 8.9–10.3)
Chloride: 97 mmol/L — ABNORMAL LOW (ref 98–111)
Chloride: 97 mmol/L — ABNORMAL LOW (ref 98–111)
Creatinine, Ser: 2.22 mg/dL — ABNORMAL HIGH (ref 0.44–1.00)
Creatinine, Ser: 3.46 mg/dL — ABNORMAL HIGH (ref 0.44–1.00)
GFR, Estimated: 25 mL/min — ABNORMAL LOW (ref >60)
GFR, Estimated: 15 mL/min — ABNORMAL LOW (ref >60)
Glucose, Bld: 123 mg/dL — ABNORMAL HIGH (ref 70–99)
Glucose, Bld: 130 mg/dL — ABNORMAL HIGH (ref 70–99)
Phosphorus: 5.8 mg/dL — ABNORMAL HIGH (ref 2.5–4.6)
Phosphorus: 8.2 mg/dL — ABNORMAL HIGH (ref 2.5–4.6)
Potassium: 4 mmol/L (ref 3.5–5.1)
Potassium: 4.4 mmol/L (ref 3.5–5.1)
Sodium: 139 mmol/L (ref 135–145)
Sodium: 139 mmol/L (ref 135–145)

## 2024-09-15 LAB — CBC
HCT: 25.8 % — ABNORMAL LOW (ref 36.0–46.0)
Hemoglobin: 9.1 g/dL — ABNORMAL LOW (ref 12.0–15.0)
MCH: 35.8 pg — ABNORMAL HIGH (ref 26.0–34.0)
MCHC: 35.3 g/dL (ref 30.0–36.0)
MCV: 101.6 fL — ABNORMAL HIGH (ref 80.0–100.0)
Platelets: 58 K/uL — ABNORMAL LOW (ref 150–400)
RBC: 2.54 MIL/uL — ABNORMAL LOW (ref 3.87–5.11)
RDW: 26.1 % — ABNORMAL HIGH (ref 11.5–15.5)
WBC: 28.7 K/uL — ABNORMAL HIGH (ref 4.0–10.5)
nRBC: 0.2 % (ref 0.0–0.2)

## 2024-09-15 LAB — GLUCOSE, CAPILLARY
Glucose-Capillary: 110 mg/dL — ABNORMAL HIGH (ref 70–99)
Glucose-Capillary: 113 mg/dL — ABNORMAL HIGH (ref 70–99)
Glucose-Capillary: 121 mg/dL — ABNORMAL HIGH (ref 70–99)
Glucose-Capillary: 130 mg/dL — ABNORMAL HIGH (ref 70–99)
Glucose-Capillary: 132 mg/dL — ABNORMAL HIGH (ref 70–99)
Glucose-Capillary: 163 mg/dL — ABNORMAL HIGH (ref 70–99)

## 2024-09-15 LAB — HEPATITIS B SURFACE ANTIBODY, QUANTITATIVE: Hep B S AB Quant (Post): <3.5 m[IU]/mL — ABNORMAL LOW

## 2024-09-15 LAB — CALCIUM, IONIZED: Calcium, Ionized, Serum: 5.2 mg/dL (ref 4.5–5.6)

## 2024-09-15 MED ORDER — HYDROMORPHONE HCL 1 MG/ML IJ SOLN: 0.2500 mg | Freq: Once | INTRAMUSCULAR | Status: DC

## 2024-09-15 MED ORDER — MIDODRINE HCL 5 MG PO TABS
5.0000 mg | ORAL_TABLET | Freq: Three times a day (TID) | ORAL | Status: DC
  Administered 2024-09-15 – 2024-09-16 (×4): 5 mg via ORAL
  Filled 2024-09-15 (×4): qty 1

## 2024-09-15 MED ORDER — ACETAMINOPHEN 160 MG/5ML PO SOLN
650.0000 mg | ORAL | Status: DC | PRN
  Administered 2024-09-15: 650 mg
  Filled 2024-09-15: qty 20.3

## 2024-09-15 MED ORDER — IPRATROPIUM-ALBUTEROL 0.5-2.5 (3) MG/3ML IN SOLN
3.0000 mL | Freq: Four times a day (QID) | RESPIRATORY_TRACT | Status: DC | PRN
  Administered 2024-09-15: 3 mL via RESPIRATORY_TRACT
  Filled 2024-09-15: qty 3

## 2024-09-15 MED ORDER — PANTOPRAZOLE SODIUM 40 MG IV SOLR
40.0000 mg | INTRAVENOUS | Status: DC
  Administered 2024-09-16 – 2024-09-17 (×2): 40 mg via INTRAVENOUS
  Filled 2024-09-15 (×2): qty 10

## 2024-09-15 NOTE — Consult Note (Signed)
 Palliative Care Consult Note                                  Date: 09/15/2024   Patient Name: Rachel Lucas  DOB: Jul 27, 1961  MRN: [id number]  Age / Sex: 63 y.o., female  PCP: Rachel Opal, DO Referring Physician: Gatha Pence, MD  Reason for Consultation: Establishing goals of care  HPI/Patient Profile: 63 y.o. female  with a new diagnosis of alcoholic cirrhosis who presented to the ED on 09/03/2024 with bilateral leg swelling and pain in her left foot.  She was admitted with septic shock likely secondary to LLE cellulitis and possible SBP.  Hospitalization has been complicated by decompensated alcoholic cirrhosis with ascites, PEA arrest, AKI requiring CRRT, acute systolic heart failure, and hepatic encephalopathy.  She was recently admitted (8/20 - 8/27) for Klebsiella bacteremia and pneumonia in the setting of otitis media from accidental ear canal perforation.  Palliative Medicine has been consulted for goals of care discussions and complex medical decision making.   Significant events: 9/15 - Admitted with working diagnosis of LE cellulitis and possible SBP in the setting of alcohol-related cirrhosis 9/16 - worsening delirium, PEA arrest for 6 minutes with ROSC, intubated 9/17 - CRRT started 9/18 - paracentesis negative for SBP 9/23 - off vasopressors 9/24 - extubated 9/25 - discontinued CRRT 9/26 - received iHD with low-dose pressor support  Clinical Assessment and Goals of Care:   Extensive chart review has been completed including labs, vital signs, imaging, progress/consult notes, orders, medications and available advance directive documents.    Update received from RN.  Patient assessed.  She remains encephalopathic and is unable to participate in meaningful GOC discussion at this time.  I met with her husband/Rachel Lucas in the 48M waiting room to discuss diagnosis, prognosis, and GOC.  Patient is known to PMT from her  recent hospitalization.  I re-introduced Palliative Medicine as specialized medical care for people with serious illness.   Created space and opportunity for husband to express thoughts and feelings regarding current medical situation. Values and goals of care were attempted to be elicited.  Life Review: Rachel Lucas was an Rachel Lucas in [place] for 30+ years. She has always been very active. She and [name] do not have children, but have a large support system with family, friends, and their prayer group.   Discussion: We discussed patient's current illness and what it means in the larger context of his/her ongoing co-morbidities. Hospital course and current clinical status was reviewed.   We reviewed Rachel Lucas's multiple issues including liver cirrhosis (recent diagnosis), AKI requiring dialysis, encephalopathy, and sepsis secondary to cellulitis.    Rachel Lucas is high risk to decompensate secondary to her multiple comorbidities. Discussed the what ifs and encouraged Rachel Lucas to consider what medical interventions patient would or would not want in the event her condition were to deteriorate.    Rachel Lucas has a good understanding of the seriousness of Rachel Lucas's current medical situation and her guarded prognosis. At this time, goal is clear for medical stabilization and recovery to the extent possible. He speaks to the importance of remaining hopeful and optimistic, as well as the power of prayer.  He is open to all offered and available medical interventions to prolong life.  In the event of another cardiac arrest, he would want resuscitation attempted again (at least one more time).   Discussed the importance of continued conversation with the medical team regarding  overall plan of care and treatment options. Hard choices book given. Rachel Lucas has PMT contact information, and was encouraged to reach out with any questions or concerns.    Review of Systems  Unable to perform ROS   Objective:    Primary Diagnoses: Present on Admission:  Septic shock (HCC)  Hepatorenal syndrome (HCC)   Physical Exam Vitals reviewed.  Constitutional:      General: She is not in acute distress.    Appearance: She is ill-appearing.  Pulmonary:     Effort: Pulmonary effort is normal.     Comments: Room air Skin:    Coloration: Skin is jaundiced.  Neurological:     Mental Status: She is lethargic and confused.    Palliative Assessment/Data: PPS 30%     Assessment & Plan:   SUMMARY OF RECOMMENDATIONS   Continue full scope care Goal of care is medical stabilization and recovery PMT will continue to follow peripherally, please call team phone [phone] if there are urgent needs  Primary Decision Maker: NEXT OF KIN - husband/Rachel Lucas  Existing Vynca/ACP Documentation: None  Code Status/Advance Care Planning: Full code  Prognosis:  guarded  Discharge Planning:  To Be Determined    Thank you for allowing us  to participate in the care of Rachel Lucas   Time Total: 76 minutes  Detailed review of medical records (labs, imaging, vital signs), medically appropriate exam, discussed with treatment team, counseling and education to patient, family, & staff, documenting clinical information, coordination of care.  Signed by: Rachel Loll, NP Palliative Medicine Team  Team Phone # [phone]  For individual providers, please see AMION

## 2024-09-15 NOTE — Progress Notes (Signed)
 NAME:  Rachel Lucas, MRN:  [id number], DOB:  1961-05-31, LOS: 12 ADMISSION DATE:  09/03/2024, CONSULTATION DATE:  09/05/2024 REFERRING MD:  EDP, CHIEF COMPLAINT:  Left leg pain  History of Present Illness:  Patient is a 63 yo female with new diagnosis of alcoholic cirrhosis with septic shock thought 2/2 LLE cellulitis, does have persistent otitis media from recent prior admission for Klebsiella pneumonia and bacteremia.    Patient had concern for single episode of melena the day prior to admission, but subsequently had normal stools.  Had no cough, congestion, or hematemesis.  Did endorse worsening LLE swelling and redness with some pain the day of admission.   Recently, patient was admitted for Klebciella bacteremia and pneumonia in setting of accidental ear canal perforation with Q-tip (8/20-8/27).   In the ED, patient was unresponsive to fluids with BP 60/30s and PCCM was consulted for admission.  Patient stated that her baseline SBP is in the 90s.   Pertinent  Medical History  Fam hx of VWD but test 8/25 neg Alcoholic cirrhosis recent new diagnosis Last alcoholic drink 08/07/2024   Significant Hospital Events: Including procedures, antibiotic start and stop dates in addition to other pertinent events   9/15 - Admitted, working dx LE cellulitis and SBP in setting of nex dx of ETOH related cirrhosis.  started Zosyn  (9/15-) and linezolid  (9/15-), GI consulted with no concern for GIB, CVC and Art line placed in PM 9/16 - Worsening delirium, PEA arrest for 6 minutes with ROSC, NG tube in place 9/17 - EEG neg for seizure. ECHO: EF 35-40%. Became hyperglycemic after switch to D10 fluids. Had minimal urine output at 50 mL. Nephro consulted for CRRT. HD cath placed.  CRRT started. IR consulted. Felt not stable for para. Stating: coagulopathy, shock and hypothermia as factors. 9/18 - Lactic acid improving. Paracentesis, negative for SBP.  Vascular surgery consulted for cold LLE, no  interventions indicated, no evidence of limb ischemia 9/19 - ID consulted, added inflammatory marker eval raising concern for vasculitis. Pressors down significantly after starting the epinephrine . Cortrak placed. For intolerance of TF, inflammatory eval: sed rate 4, lactate 6.6->4.6. tbili 17 (rising from 11.9). wbc still elevated. Hgb 7.5 to 7. Plts 49->43 9/20 - hemodynamics a little better. Still w/ sig WBC ct. Prob steroids but repeating abd US  and CT imaging of head negative to r/o mastoiditis. 9/21 - All CT imaging including CT abdomen pelvis as well as CT temporal bone all reassuring no new evidence of infection.  Ultrasound abdomen negative.  Platelets down again, sending additional eval to rule out TTP.  Getting more platelets transfused.  Change abx to CTX and metronidazole . 9/22 - Off vasopressin , minimal sedation but remains encephalopathic. 9/23 - Off pressors, more alert and following instructions.  Weaning from vent. 9/24 - Overnight agitation and required norepi briefly but weaned and extubated in the AM, changed from CTX and metronidazole  to meropenem . 9/25 - Stable off ventilator, intermittent pressor requirements, discontinuing CRRT. 9/26 - Minimal pressor requirement, but could not fully wean. 9/27 - Desatted in the AM, required HFNC.   Interim History / Subjective:  Yesterday, the plan was to transition patient out of ICU today, but the patient  did require pressors again yesterday afternoon and remains in the ICU today. This AM, patient remains on 1 mcg of norepinephrine .  Upon evaluation, she did desat to 80s on room air and was placed on HFNC 10 L with gradual return to low 90s SpO2. The patient is alert  to self and place and able to follow instructions consistently.  She states that she feel happy and denies pain or discomfort.   Objective    Blood pressure (!) 89/38, pulse 96, temperature (!) 97.5 F (36.4 C), temperature source Oral, resp. rate (!) 36, height 5' 3  (1.6 m), weight 44.7 kg, SpO2 96%.        Intake/Output Summary (Last 24 hours) at 09/15/2024 0708 Last data filed at 09/15/2024 0600 Gross per 24 hour  Intake 1290.53 ml  Output 2350 ml  Net -1059.47 ml   Filed Weights   09/13/24 0500 09/14/24 0500 09/14/24 1330  Weight: 45.8 kg 44.7 kg 44.7 kg    Examination: General: Chronically ill-appearing, deconditioned.  Resting comfortably in bed, NAD, awake and alert. HEENT: [place] in place.  Scleral icterus.  Clear oropharynx.  Dry lips. Cardiovascular: Regular rate and rhythm. Normal S1/S2. No murmurs, rubs, or gallops appreciated. 2+ radial pulses. Pulmonary: Clear bilaterally to ascultation. No wheezes, crackles, or rhonchi. No increased WOB, no tachypnea on HFNC. Abdominal: No tenderness to deep or light palpation. No rebound or guarding, mildly distended. Skin: Warm and dry.  Jaundiced. Extremities: Trace peripheral edema bilaterally.  LLE with bullae and violaceous discoloration wrapped in Kerlix dressing, not draining through dressing.  Scattered violaceous discoloration and bullae across other extremities.  PT and DP pulses 2+ in BLE.  All distal extremities warm and perfusing.  Capillary refill 2 seconds. Neuro: Awake, following instructions.  Alert to self and place.  Not agitated.  Select labs and studies Cr 1.01>2.22 BUN 25>61 Phosphorus 1.9>5.8 ADAMTS13 activity 23.1%, antibody WNL I-STAT PaO2 of 63 CXR unchanged from prior, no pneumothorax, PNA, pulmonary edema    Resolved problem list  Septic shock Acute hypoxic respiratory failure 2/2 acute lung injury (extubated 9/24)   Assessment and Plan   Severe sepsis 2/2 GNR bacteremia further complicated by acute systolic heart failure WBC again increased (30.8>35.8) but remains stable off pressors >24 hours.  BCx 9/15 with GNRs and poor growth, repeat BCx 9/19 with NGTD.  Now on day 4 of meropenem  and ID plans for 7 days total. Plan: - Stress dose steroids tapered off 9/23 -  Cultures from 9/15 sent to [facility] for better growth, solely GNRs noted - Meropenem  (9/24>), day 4 of 7 (last day 12-Oct-2024) - s/p Zosyn  (9/15-9/21), CTX (9/21>9/24), metronidazole  (9/21>9/24) - No GDMT given sepsis - PPI daily - Start midodrine  5 mg TID to facilitate weaning from norepinephrine    Hypoxemia This AM, patient has desatted and is requiring HFNC.  CXR unchanged with no evidence of pulmonary edema.  I-STAT ABG with low PaO2 of 63.  Weaned to room air rapidly after starting incentive spirometry.  Patient remains comfortable on exam and without evidence of respiratory distress.  Suspect atelectasis as primary etiology. - Q1h incentive spirometry, encourage up in bed - Continue PT/OT as able - Obtain CTA if acutely worsening SpO2, Nephrology agrees with contrast if emergent need    LLE bullae and mottling Possible this is secondary to cellulitis versus volume overload, now remains stable.  Considered SJS on 9/22 and discontinued rifaximin , low concern overall for this condition.  Neg for DVT on 9/17, felt exacerbated by hydrostatic pressure and volume overload. Evaluated by vascular on 9/18 and 9/19 with no concern for limb ischemia. No eosinophilia on CBC w/ diff. Plan: - May ultimately require advanced wound care therapies if recovers from acute illness - Close pulse checks - Appreciate wound care assistance - Volume removal  w/ CRRT continuing - Plastic surgery consulted 9/26 and will continue to follow     Extubated 9/24 Dysphagia Now extubated and stable on room air.  SLP still recommending NPO given severe aspiration risk. Plan: - NPO - Meds per tube and IV - Oral care QID - SLP following - PT eval and treat     AKI 2/2 ischemic ATN post PEA arrest Acute renal failure on HD Hyperphosphatemia, intermittent Remains anuric.  Rapidly rising BUN and phosphorus.  Given azotemia, Nephro expects HD will be needed tomorrow. Plan: - Strict I&Os - Foley removed 9/24 - Renal  panels BID - HD possibly 9/28 per Nephro - Consider adjusting feeds to renal diet     Decompensated alcoholic cirrhosis with ascites, Hx gastric varices, and superimposed shock liver Hepatic encephalopathy ICU delirium Encephalopathy improved, sleep-wake cycle is dysregulated but improving.  1300 mL flatus pouch output past 24 hours.  Likely component of metabolic encephalopathy and ICU delirium given nighttime agitation. Plan: - Decreased lactulose  to 20 mg TID, follow with ammonia as needed - Rifaximin  discontinued as above given concern for SJS - Monitor frequency of BMs daily - Continue to reorient, delirium precautions - Holding home propranolol  and spiro in setting of sepsis - Minimize sedation as able - Per GI 9/25, if patient is able to recover and discharge, maintain abstinence from EtOH, will need to follow up with [facility] or [facility] for transplant consideration     Critical illness related protein calorie malnutrition Liquid stools Appreciate RD consult, post-pyloric Cortrack placed 9/20. Plan: - Advance trickle feeds via Cortrak as able - Stopped stool softeners as she is on lactulose  and think gut edema affecting absorption - Flatus pouch in place - Consider low phosphorus tube feed as above     Chronic macrocytic anemia Status post 1 unit PRBC 9/16 and 9/20. Hemoglobin now climbing. Plan: - Holding AC given coagulopathy - AM CBC - Transfusion threshold Hgb <7     Acute on chronic thrombocytopenia Coagulopathy (septic coagulopathy/DIC superimposed on decompensated cirrhosis) Plts 38>31 without evidence of frank bleeding.  Received platelets 9/17, again prior to paracentesis on 9/18, linezolid  stopped 9/18, again received platelets 9/21.  Also not a candidate for [facility] given coagulopathy. ADAMTS13 activity low but above 10% diagnostic level and negative antibody, do not suspect TTP.  Haptoglobin low and LDH elevated, suggesting some hemolysis. Plan: - Heme following,  appreciate recs - Plt transfusion threshold <20 per Heme - If needs procedures, threshold for Plts >50 - Avoiding AC given coagulopathy and wounds with high risk for bleeding, unable to place SCDs due to LE wounds - Follow INR every 3 days, daily CBC     Hypothyroidism Stable chronic problem. Plan: - Continue levothyroxine  88 mcg     Glycemic control Now off steroids, mildly hyperglycemic.  Tube feeds continuing.  A1c 4.3. 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 Tobie with [facility] 9/16, who related that he has a no suspicion for mastoiditis after reviewing CT imaging.  Noted that patient has a packing wick in place and TM is open, so fluid would be expected but does not suspect mastoiditis based on radiography. Repeated imaging 9/20 improved aeration of middle ear cavity with no evidence of mastoiditis. Plan: - Ciprodex  drops changed to ofloxacin  (family request and cleared w/ ENT) both ears (started 9/20) - Monitor postauricular area for proptosis  GI PPx: Pantoprazole  changed back to daily given no GIB DVT PPx: Heparin  held given thrombocytopenia, unable to place SCDs given LE wounds   Labs   CBC: Recent Labs  Lab 09/09/24 0941 09/10/24 0408 09/10/24 0916 09/11/24 0335 09/11/24 0339 09/12/24 0333 09/13/24 0418 09/14/24 0516  WBC 18.2* 22.5*  --  28.3*  --  35.2* 30.8* 35.8*  NEUTROABS 16.2*  --   --   --   --   --  26.1*  --   HGB 7.2* 7.7*   < > 8.2* 8.8* 9.5* 10.0* 9.5*  HCT 21.1* 22.6*   < > 23.6* 26.0* 27.0* 28.4* 27.1*  MCV 101.4* 100.9*  --  100.4*  --  100.4* 100.4* 101.5*  PLT 24* 45*  --  29*  --  38* 31* 39*   < > = values in this interval not displayed.    Basic Metabolic Panel: Recent Labs  Lab 09/11/24 0001 09/11/24 0335 09/11/24 0339 09/12/24 0333 09/12/24 0808 09/12/24 1523 09/13/24 0418 09/13/24 1524 09/14/24 0516 09/14/24 1627  09/15/24 0345  NA 141 141  141   < > 141 138   < > 137 138 142  140 137 139  K 3.1* 3.1*  3.1*   < > 3.2* 4.2   < > 3.7 4.0 4.0  4.0 3.1* 4.0  CL 105 103  105   < > 106 106   < > 104 103 103  104 96* 97*  CO2 23 23  23    < > 24 23   < > 21* 21* 19*  18* 24 25  GLUCOSE 163* 150*  151*   < > 147* 135*   < > 165* 160* 139*  136* 166* 123*  BUN 23* 20  20   < > 24* 24*   < > 27* 46* 91*  89* 25* 61*  CREATININE 1.01* 1.03*  1.10*   < > 1.15* 1.17*   < > 1.14* 1.59* 2.73*  2.82* 1.01* 2.22*  CALCIUM  8.9 9.0  9.0   < > 8.9 9.0   < > 9.6 9.9 10.4*  10.4* 10.4* 10.5*  MG 2.2 2.4  --  2.4 2.5*  --  2.5*  --   --   --   --   PHOS  --  1.8*   < > 2.3*  --    < > 2.6 3.7 5.8* 1.9* 5.8*   < > = values in this interval not displayed.   GFR: Estimated Creatinine Clearance: 19 mL/min (A) (by C-G formula based on SCr of 2.22 mg/dL (H)). Recent Labs  Lab 09/09/24 0320 09/09/24 0941 09/11/24 0335 09/12/24 0333 09/13/24 0418 09/14/24 0516  WBC 20.2*   < > 28.3* 35.2* 30.8* 35.8*  LATICACIDVEN 2.4*  --   --   --   --   --    < > = values in this interval not displayed.    Liver Function Tests: Recent Labs  Lab 09/10/24 0408 09/10/24 1518 09/11/24 0335 09/11/24 1556 09/12/24 0333 09/12/24 1523 09/13/24 0418 09/13/24 1524 09/14/24 0516 09/14/24 1627 09/15/24 0345  AST 92*  --  84*  --  77*  --  89*  --  79*  --   --   ALT 50*  --  48*  --  50*  --  60*  --  50*  --   --   ALKPHOS 92  --  104  --  157*  --  171*  --  203*  --   --   BILITOT 18.5*  --  14.1*  --  13.0*  --  14.9*  --  11.8*  --   --   PROT 7.2  --  6.5  --  7.0  --  8.1  --  7.3  --   --   ALBUMIN  5.2*   < > 4.6  4.5   < > 4.5  4.4   < > 4.9  4.9 4.7 4.3  4.4 6.5* 5.1*   < > = values in this interval not displayed.   No results for input(s): LIPASE, AMYLASE in the last 168 hours. Recent Labs  Lab 09/11/24 0336 09/14/24 1100  AMMONIA 64* 49*    ABG    Component Value Date/Time   PHART 7.469  (H) 09/11/2024 0339   PCO2ART 36.3 09/11/2024 0339   PO2ART 110 (H) 09/11/2024 0339   HCO3 26.4 09/11/2024 0339   TCO2 27 09/11/2024 0339   ACIDBASEDEF 1.0 09/07/2024 0500   O2SAT 99 09/11/2024 0339     Coagulation Profile: Recent Labs  Lab 09/09/24 0320 09/10/24 0408 09/11/24 0335 09/12/24 0333 09/13/24 0418  INR 3.1* 2.9* 2.9* 3.1* 2.5*    Cardiac Enzymes: No results for input(s): CKTOTAL, CKMB, CKMBINDEX, TROPONINI in the last 168 hours.  HbA1C: Hgb A1c MFr Bld  Date/Time Value Ref Range Status  09/10/2024 04:08 AM 4.3 (L) 4.8 - 5.6 % Final    Comment:    (NOTE) Diagnosis of Diabetes The following HbA1c ranges recommended by the American Diabetes Association (ADA) may be used as an aid in the diagnosis of diabetes mellitus.  Hemoglobin             Suggested A1C NGSP%              Diagnosis  <5.7                   Non Diabetic  5.7-6.4                Pre-Diabetic  >6.4                   Diabetic  <7.0                   Glycemic control for                       adults with diabetes.      CBG: Recent Labs  Lab 09/14/24 1115 09/14/24 1542 09/14/24 1952 09/14/24 2340 09/15/24 0344  GLUCAP 121* 106* 126* 105* 113*    Past Medical History:  She,  has a past medical history of Hypothyroidism, Thyroid  disease, and Von Willebrand disease (HCC).   Surgical History:   Past Surgical History:  Procedure Laterality Date   CYST REMOVAL NECK  1995   IR PARACENTESIS  09/06/2024     Social History:   reports that she quit smoking about 34 years ago. Her smoking use included cigarettes. She started smoking about 44 years ago. She has a 2 pack-year smoking history. She does not have any smokeless tobacco history on file. She reports that she does not currently use alcohol. She reports that she does not use drugs.   Family History:  Her family history includes Asthma in her mother; Eczema in her mother; Heart Problems in her father.    Allergies Allergies  Allergen Reactions   Levofloxacin Other (See Comments)   Sulfonamide Derivatives Palpitations  Home Medications  Prior to Admission medications   Medication Sig Start Date End Date Taking? Authorizing Provider  folic acid  (FOLVITE ) 1 MG tablet Take 1 tablet (1 mg total) by mouth daily. 08/16/24  Yes Dennise Lavada POUR, MD  furosemide  (LASIX ) 20 MG tablet Take 1 tablet (20 mg total) by mouth daily. 08/30/24  Yes Craig Palma R, PA-C  lactulose , encephalopathy, (ENULOSE ) 10 GM/15ML SOLN Take 15 mLs (10 g total) by mouth 2 (two) times daily. 08/21/24  Yes Federico Rosario BROCKS, MD  levothyroxine  (SYNTHROID ) 88 MCG tablet Take 1 tablet (88 mcg total) by mouth daily before breakfast. 08/15/24  Yes Singh, Prashant K, MD  midodrine  (PROAMATINE ) 5 MG tablet Take 1 tablet (5 mg total) by mouth 2 (two) times daily with a meal. 08/30/24  Yes Craig Palma R, PA-C  propranolol  (INDERAL ) 10 MG tablet Take 1 tablet (10 mg total) by mouth 2 (two) times daily. 08/30/24  Yes Craig Palma R, PA-C  spironolactone  (ALDACTONE ) 50 MG tablet Take 1 tablet (50 mg total) by mouth daily. 08/30/24  Yes Craig Palma R, PA-C  thiamine  (VITAMIN B1) 100 MG tablet Take 1 tablet (100 mg total) by mouth daily. 08/16/24  Yes Singh, Prashant K, MD  potassium chloride  SA (KLOR-CON  M) 20 MEQ tablet Take 1 tablet (20 mEq total) by mouth daily for 2 days. 08/21/24 08/31/24  Federico Rosario BROCKS, MD     Gordie Belvin Toma, MD PGY-2, ICU resident 09/15/24, 7:08 AM

## 2024-09-15 NOTE — Plan of Care (Signed)
  Problem: Clinical Measurements: Goal: Will remain free from infection Outcome: Progressing Goal: Respiratory complications will improve Outcome: Progressing Goal: Cardiovascular complication will be avoided Outcome: Progressing   Problem: Nutrition: Goal: Adequate nutrition will be maintained Outcome: Progressing   Problem: Coping: Goal: Level of anxiety will decrease Outcome: Progressing   Problem: Elimination: Goal: Will not experience complications related to bowel motility Outcome: Progressing   Problem: Respiratory: Goal: Ability to maintain adequate ventilation will improve Outcome: Progressing   Problem: Coping: Goal: Ability to adjust to condition or change in health will improve Outcome: Progressing   Problem: Education: Goal: Knowledge of General Education information will improve Description: Including pain rating scale, medication(s)/side effects and non-pharmacologic comfort measures Outcome: Not Progressing   Problem: Health Behavior/Discharge Planning: Goal: Ability to manage health-related needs will improve Outcome: Not Progressing   Problem: Clinical Measurements: Goal: Ability to maintain clinical measurements within normal limits will improve Outcome: Not Progressing Goal: Diagnostic test results will improve Outcome: Not Progressing   Problem: Activity: Goal: Risk for activity intolerance will decrease Outcome: Not Progressing   Problem: Elimination: Goal: Will not experience complications related to urinary retention Outcome: Not Progressing   Problem: Pain Managment: Goal: General experience of comfort will improve and/or be controlled Outcome: Not Progressing   Problem: Safety: Goal: Ability to remain free from injury will improve Outcome: Not Progressing   Problem: Skin Integrity: Goal: Risk for impaired skin integrity will decrease Outcome: Not Progressing   Problem: Fluid Volume: Goal: Hemodynamic stability will  improve Outcome: Not Progressing   Problem: Clinical Measurements: Goal: Diagnostic test results will improve Outcome: Not Progressing Goal: Signs and symptoms of infection will decrease Outcome: Not Progressing   Problem: Education: Goal: Ability to manage disease process will improve Outcome: Not Progressing   Problem: Cardiac: Goal: Ability to achieve and maintain adequate cardiopulmonary perfusion will improve Outcome: Not Progressing   Problem: Neurologic: Goal: Promote progressive neurologic recovery Outcome: Not Progressing   Problem: Skin Integrity: Goal: Risk for impaired skin integrity will be minimized. Outcome: Not Progressing   Problem: Education: Goal: Ability to describe self-care measures that may prevent or decrease complications (Diabetes Survival Skills Education) will improve Outcome: Not Progressing Goal: Individualized Educational Video(s) Outcome: Not Progressing   Problem: Fluid Volume: Goal: Ability to maintain a balanced intake and output will improve Outcome: Not Progressing   Problem: Health Behavior/Discharge Planning: Goal: Ability to identify and utilize available resources and services will improve Outcome: Not Progressing Goal: Ability to manage health-related needs will improve Outcome: Not Progressing   Problem: Metabolic: Goal: Ability to maintain appropriate glucose levels will improve Outcome: Not Progressing   Problem: Nutritional: Goal: Maintenance of adequate nutrition will improve Outcome: Not Progressing Goal: Progress toward achieving an optimal weight will improve Outcome: Not Progressing   Problem: Skin Integrity: Goal: Risk for impaired skin integrity will decrease Outcome: Not Progressing   Problem: Tissue Perfusion: Goal: Adequacy of tissue perfusion will improve Outcome: Not Progressing

## 2024-09-15 NOTE — Progress Notes (Signed)
 Admit: 09/03/2024 LOS: 12  27F anuric AKI cardiac arrest, septic shock, GNR bacteremia, decompensated cirrhosis, ARDS  S: Received dialysis yesterday supported by albumin  and low-dose pressors Remains anuric 1.1 L UF K4.0, BUN 61  O: 09/26 0701 - 09/27 0700 In: 1331.5 [I.V.:41.6; NG/GT:1080; IV Piggyback:199.9] Out: 2350 [Stool:1250]  Filed Weights   09/13/24 0500 09/14/24 0500 09/14/24 1330  Weight: 45.8 kg 44.7 kg 44.7 kg    Recent Labs  Lab 09/14/24 0516 09/14/24 1627 09/15/24 0345  NA 142  140 137 139  K 4.0  4.0 3.1* 4.0  CL 103  104 96* 97*  CO2 19*  18* 24 25  GLUCOSE 139*  136* 166* 123*  BUN 91*  89* 25* 61*  CREATININE 2.73*  2.82* 1.01* 2.22*  CALCIUM  10.4*  10.4* 10.4* 10.5*  PHOS 5.8* 1.9* 5.8*   Recent Labs  Lab 09/09/24 0941 09/10/24 0408 09/13/24 0418 09/14/24 0516 09/15/24 0839  WBC 18.2*   < > 30.8* 35.8* 28.7*  NEUTROABS 16.2*  --  26.1*  --   --   HGB 7.2*   < > 10.0* 9.5* 9.1*  HCT 21.1*   < > 28.4* 27.1* 25.8*  MCV 101.4*   < > 100.4* 101.5* 101.6*  PLT 24*   < > 31* 39* PENDING   < > = values in this interval not displayed.    Scheduled Meds:  Chlorhexidine  Gluconate Cloth  6 each Topical Q0600   feeding supplement (PROSource TF20)  60 mL Per Tube BID   folic acid   1 mg Per Tube Daily   insulin  aspart  0-9 Units Subcutaneous Q4H   lactulose   20 g Per Tube TID   levothyroxine   88 mcg Per Tube QAC breakfast   midodrine   5 mg Oral TID WC   multivitamin  1 tablet Per Tube QHS   ofloxacin   5 drop Both EARS Daily   pantoprazole  (PROTONIX ) IV  40 mg Intravenous Q12H   sodium chloride  flush  10-40 mL Intracatheter Q12H   thiamine   100 mg Per Tube Daily   Continuous Infusions:  feeding supplement (VITAL 1.5 CAL) 40 mL/hr at 09/15/24 0900   meropenem  (MERREM ) IV Stopped (09/14/24 2209)   norepinephrine  (LEVOPHED ) Adult infusion 2 mcg/min (09/15/24 0900)   PRN Meds:.artificial tears, diphenhydrAMINE , heparin , melatonin,  OLANZapine , ondansetron  (ZOFRAN ) IV, mouth rinse, [START ON 10-06-2024] pneumococcal 20-valent conjugate vaccine, sodium chloride  flush  ABG    Component Value Date/Time   PHART 7.469 (H) 09/11/2024 0339   PCO2ART 36.3 09/11/2024 0339   PO2ART 110 (H) 09/11/2024 0339   HCO3 26.4 09/11/2024 0339   TCO2 27 09/11/2024 0339   ACIDBASEDEF 1.0 09/07/2024 0500   O2SAT 99 09/11/2024 0339   Chronically ill-appearing, jaundiced Awake, alert Coarse breath sounds bilaterally Regular, normal S1 and S2 1-2+ lower extremity edema  A/P  Dialysis dependent anuric AKI from ATN on CRRT through 09/13/2024, tolerated i 9/26HD  Cardiac arrest PEA 9/16 Septic shock with previous GNR bacteremia on/off vasopressors,  AHRF/ARDS per CCM, extubated 9/24 Decompensated alcoholic cirrhosis with portal hypertension, variceal disease  Coagulopathy, thrombocytopenia, hematology following, DIC favored, TTP unlikely Hypophosphatemia related to CRRT, rebounded after discontinuation on enteral nutrition hypokalemia stable now off CRRT anemia  No dialysis indicated here today, reassess here tomorrow. Family/friends updated at bedside, all questions answered Eventually will need to move towards tunneled HD catheter would like to see more progress first  Bernardino Gasman, MD [employer]

## 2024-09-16 DIAGNOSIS — K7010 Alcoholic hepatitis without ascites: Secondary | ICD-10-CM | POA: Diagnosis not present

## 2024-09-16 DIAGNOSIS — E43 Unspecified severe protein-calorie malnutrition: Secondary | ICD-10-CM

## 2024-09-16 DIAGNOSIS — K7030 Alcoholic cirrhosis of liver without ascites: Secondary | ICD-10-CM | POA: Diagnosis not present

## 2024-09-16 DIAGNOSIS — R7881 Bacteremia: Secondary | ICD-10-CM | POA: Diagnosis not present

## 2024-09-16 DIAGNOSIS — Z992 Dependence on renal dialysis: Secondary | ICD-10-CM

## 2024-09-16 DIAGNOSIS — R6521 Severe sepsis with septic shock: Secondary | ICD-10-CM | POA: Diagnosis not present

## 2024-09-16 LAB — POCT I-STAT 7, (LYTES, BLD GAS, ICA,H+H)
Acid-Base Excess: 2 mmol/L (ref 0.0–2.0)
Bicarbonate: 25.1 mmol/L (ref 20.0–28.0)
Calcium, Ion: 1.2 mmol/L (ref 1.15–1.40)
HCT: 29 % — ABNORMAL LOW (ref 36.0–46.0)
Hemoglobin: 9.9 g/dL — ABNORMAL LOW (ref 12.0–15.0)
O2 Saturation: 94 %
Patient temperature: 36.1
Potassium: 4.1 mmol/L (ref 3.5–5.1)
Sodium: 138 mmol/L (ref 135–145)
TCO2: 26 mmol/L (ref 22–32)
pCO2 arterial: 31.8 mmHg — ABNORMAL LOW (ref 32–48)
pH, Arterial: 7.502 — ABNORMAL HIGH (ref 7.35–7.45)
pO2, Arterial: 59 mmHg — ABNORMAL LOW (ref 83–108)

## 2024-09-16 LAB — RENAL FUNCTION PANEL
Albumin: 4.5 g/dL (ref 3.5–5.0)
Albumin: 4.6 g/dL (ref 3.5–5.0)
Anion gap: 25 — ABNORMAL HIGH (ref 5–15)
Anion gap: 20 — ABNORMAL HIGH (ref 5–15)
BUN: 143 mg/dL — ABNORMAL HIGH (ref 6–20)
BUN: 130 mg/dL — ABNORMAL HIGH (ref 6–20)
CO2: 16 mmol/L — ABNORMAL LOW (ref 22–32)
CO2: 19 mmol/L — ABNORMAL LOW (ref 22–32)
Calcium: 10.8 mg/dL — ABNORMAL HIGH (ref 8.9–10.3)
Calcium: 9.5 mg/dL (ref 8.9–10.3)
Chloride: 101 mmol/L (ref 98–111)
Chloride: 103 mmol/L (ref 98–111)
Creatinine, Ser: 4.36 mg/dL — ABNORMAL HIGH (ref 0.44–1.00)
Creatinine, Ser: 3.93 mg/dL — ABNORMAL HIGH (ref 0.44–1.00)
GFR, Estimated: 11 mL/min — ABNORMAL LOW (ref >60)
GFR, Estimated: 12 mL/min — ABNORMAL LOW (ref >60)
Glucose, Bld: 147 mg/dL — ABNORMAL HIGH (ref 70–99)
Glucose, Bld: 116 mg/dL — ABNORMAL HIGH (ref 70–99)
Phosphorus: 8.5 mg/dL — ABNORMAL HIGH (ref 2.5–4.6)
Phosphorus: 6.6 mg/dL — ABNORMAL HIGH (ref 2.5–4.6)
Potassium: 4.3 mmol/L (ref 3.5–5.1)
Potassium: 3.9 mmol/L (ref 3.5–5.1)
Sodium: 142 mmol/L (ref 135–145)
Sodium: 142 mmol/L (ref 135–145)

## 2024-09-16 LAB — CG4 I-STAT (LACTIC ACID)
Lactic Acid, Venous: 1.8 mmol/L (ref 0.5–1.9)
Lactic Acid, Venous: 2 mmol/L (ref 0.5–1.9)

## 2024-09-16 LAB — CBC
HCT: 25.9 % — ABNORMAL LOW (ref 36.0–46.0)
Hemoglobin: 9 g/dL — ABNORMAL LOW (ref 12.0–15.0)
MCH: 35.3 pg — ABNORMAL HIGH (ref 26.0–34.0)
MCHC: 34.7 g/dL (ref 30.0–36.0)
MCV: 101.6 fL — ABNORMAL HIGH (ref 80.0–100.0)
Platelets: 106 K/uL — ABNORMAL LOW (ref 150–400)
RBC: 2.55 MIL/uL — ABNORMAL LOW (ref 3.87–5.11)
RDW: 26.2 % — ABNORMAL HIGH (ref 11.5–15.5)
WBC: 29.5 K/uL — ABNORMAL HIGH (ref 4.0–10.5)
nRBC: 0.3 % — ABNORMAL HIGH (ref 0.0–0.2)

## 2024-09-16 LAB — CALCIUM, IONIZED
Calcium, Ionized, Serum: 5 mg/dL (ref 4.5–5.6)
Calcium, Ionized, Serum: 5 mg/dL (ref 4.5–5.6)

## 2024-09-16 LAB — GLUCOSE, CAPILLARY
Glucose-Capillary: 116 mg/dL — ABNORMAL HIGH (ref 70–99)
Glucose-Capillary: 123 mg/dL — ABNORMAL HIGH (ref 70–99)
Glucose-Capillary: 137 mg/dL — ABNORMAL HIGH (ref 70–99)
Glucose-Capillary: 143 mg/dL — ABNORMAL HIGH (ref 70–99)
Glucose-Capillary: 145 mg/dL — ABNORMAL HIGH (ref 70–99)
Glucose-Capillary: 166 mg/dL — ABNORMAL HIGH (ref 70–99)

## 2024-09-16 LAB — MAGNESIUM: Magnesium: 3.2 mg/dL — ABNORMAL HIGH (ref 1.7–2.4)

## 2024-09-16 MED ORDER — PRISMASOL BGK 4/2.5 32-4-2.5 MEQ/L EC SOLN: Status: DC

## 2024-09-16 MED ORDER — SODIUM CHLORIDE 0.9 % FOR CRRT: INTRAVENOUS_CENTRAL | Status: DC | PRN

## 2024-09-16 MED ORDER — MELATONIN 5 MG PO TABS
5.0000 mg | ORAL_TABLET | Freq: Every evening | ORAL | Status: DC | PRN
  Administered 2024-09-17: 5 mg
  Filled 2024-09-16: qty 1

## 2024-09-16 MED ORDER — MEROPENEM 1 G IV SOLR
1.0000 g | Freq: Three times a day (TID) | INTRAVENOUS | Status: DC
  Administered 2024-09-16 – 2024-09-17 (×4): 1 g via INTRAVENOUS
  Filled 2024-09-16 (×4): qty 20

## 2024-09-16 MED ORDER — ALBUMIN HUMAN 25 % IV SOLN
INTRAVENOUS | Status: AC
  Filled 2024-09-16: qty 50

## 2024-09-16 MED ORDER — PRISMASOL BGK 4/2.5 32-4-2.5 MEQ/L EC SOLN
Status: DC
Start: 2024-09-16 — End: 2024-09-18

## 2024-09-16 MED ORDER — MIDODRINE HCL 5 MG PO TABS
10.0000 mg | ORAL_TABLET | Freq: Three times a day (TID) | ORAL | Status: DC
  Administered 2024-09-16 – 2024-09-17 (×4): 10 mg
  Filled 2024-09-16 (×4): qty 2

## 2024-09-16 MED ORDER — MIDODRINE HCL 5 MG PO TABS: 10.0000 mg | ORAL_TABLET | Freq: Three times a day (TID) | ORAL | Status: DC

## 2024-09-16 MED ORDER — ALBUMIN HUMAN 25 % IV SOLN
12.5000 g | Freq: Once | INTRAVENOUS | Status: AC
  Administered 2024-09-16: 12.5 g via INTRAVENOUS

## 2024-09-16 MED ORDER — MORPHINE SULFATE (PF) 2 MG/ML IV SOLN
1.0000 mg | Freq: Once | INTRAVENOUS | Status: AC | PRN
  Administered 2024-09-16: 1 mg via INTRAVENOUS
  Filled 2024-09-16: qty 1

## 2024-09-16 NOTE — Plan of Care (Signed)
  Problem: Education: Goal: Knowledge of General Education information will improve Description: Including pain rating scale, medication(s)/side effects and non-pharmacologic comfort measures Outcome: Progressing   Problem: Clinical Measurements: Goal: Ability to maintain clinical measurements within normal limits will improve Outcome: Progressing Goal: Will remain free from infection Outcome: Progressing Goal: Diagnostic test results will improve Outcome: Progressing   Problem: Health Behavior/Discharge Planning: Goal: Ability to identify and utilize available resources and services will improve Outcome: Progressing Goal: Ability to manage health-related needs will improve Outcome: Progressing   Problem: Nutritional: Goal: Maintenance of adequate nutrition will improve Outcome: Progressing

## 2024-09-16 NOTE — Progress Notes (Signed)
 Admit: 09/03/2024 LOS: 13  18F anuric AKI cardiac arrest, septic shock, GNR bacteremia, decompensated cirrhosis, ARDS  S: No interval events Remains anuric BUN up to 143, K4.3, bicarbonate 16 with anion gap 25  O: 09/27 0701 - 09/28 0700 In: 1555.1 [I.V.:45.1; NG/GT:1410; IV Piggyback:100] Out: 1200 [Stool:1200]  Filed Weights   09/13/24 0500 09/14/24 0500 09/14/24 1330  Weight: 45.8 kg 44.7 kg 44.7 kg    Recent Labs  Lab 09/15/24 0345 09/15/24 0852 09/15/24 1754 09/16/24 0408  NA 139 138 139 142  K 4.0 4.1 4.4 4.3  CL 97*  --  97* 101  CO2 25  --  19* 16*  GLUCOSE 123*  --  130* 147*  BUN 61*  --  110* 143*  CREATININE 2.22*  --  3.46* 4.36*  CALCIUM  10.5*  --  10.8* 10.8*  PHOS 5.8*  --  8.2* 8.5*   Recent Labs  Lab 09/13/24 0418 09/14/24 0516 09/15/24 0839 09/15/24 0852 09/16/24 0408  WBC 30.8* 35.8* 28.7*  --  29.5*  NEUTROABS 26.1*  --   --   --   --   HGB 10.0* 9.5* 9.1* 9.9* 9.0*  HCT 28.4* 27.1* 25.8* 29.0* 25.9*  MCV 100.4* 101.5* 101.6*  --  101.6*  PLT 31* 39* 58*  --  106*    Scheduled Meds:  Chlorhexidine  Gluconate Cloth  6 each Topical Q0600   feeding supplement (PROSource TF20)  60 mL Per Tube BID   folic acid   1 mg Per Tube Daily   insulin  aspart  0-9 Units Subcutaneous Q4H   lactulose   20 g Per Tube TID   levothyroxine   88 mcg Per Tube QAC breakfast   midodrine   5 mg Oral TID WC   multivitamin  1 tablet Per Tube QHS   ofloxacin   5 drop Both EARS Daily   pantoprazole  (PROTONIX ) IV  40 mg Intravenous Q24H   sodium chloride  flush  10-40 mL Intracatheter Q12H   thiamine   100 mg Per Tube Daily   Continuous Infusions:  feeding supplement (VITAL 1.5 CAL) 40 mL/hr at 09/16/24 0900   meropenem  (MERREM ) IV Stopped (09/15/24 2330)   norepinephrine  (LEVOPHED ) Adult infusion 3 mcg/min (09/16/24 0900)   PRN Meds:.artificial tears, diphenhydrAMINE , heparin , ipratropium-albuterol , melatonin, OLANZapine , ondansetron  (ZOFRAN ) IV, mouth rinse, [START  ON 10/16/24] pneumococcal 20-valent conjugate vaccine, sodium chloride  flush  ABG    Component Value Date/Time   PHART 7.502 (H) 09/15/2024 0852   PCO2ART 31.8 (L) 09/15/2024 0852   PO2ART 59 (L) 09/15/2024 0852   HCO3 25.1 09/15/2024 0852   TCO2 26 09/15/2024 0852   ACIDBASEDEF 1.0 09/07/2024 0500   O2SAT 94 09/15/2024 0852   Chronically ill-appearing, jaundiced Awake, alert Coarse breath sounds bilaterally Regular, normal S1 and S2 1-2+ lower extremity edema  A/P  Dialysis dependent anuric AKI from ATN on CRRT through 09/13/2024, tolerated i 9/26HD  Severe azotemia/catabolic state in addition to low GFR causing high BUN Cardiac arrest PEA 9/16 Septic shock with previous GNR bacteremia on/off vasopressors,  AHRF/ARDS per CCM, extubated 9/24 Decompensated alcoholic cirrhosis with portal hypertension, variceal disease  Coagulopathy, thrombocytopenia, hematology following, DIC favored, TTP unlikely Hypophosphatemia  Dialysis today, per severe catabolic state and high BUN makes me worried that she could have a decompensation prior to routine dialysis tomorrow Family/friends updated at bedside, all questions answered Eventually will need to move towards tunneled HD catheter would like to see more progress first  Assessment and plan discussed with Dr. Gatha, CCM MD  Rachel Lucas  Rachel Gasman, MD  [facility]

## 2024-09-16 NOTE — Progress Notes (Signed)
 Patient unable to tolerate increased pulling on CRRT for goal of -50 an hour, MAPs in the 50s. Levo gtt increased to try and allow for additional fluid removal.    09/16/24 2100  Art Line  Arterial Line BP 109/40  Arterial Line MAP (mmHg) 53 mmHg     09/17/24 0322  Art Line  Arterial Line BP 103/34  Arterial Line MAP (mmHg) 46 mmHg

## 2024-09-16 NOTE — Progress Notes (Signed)
 Brief Nephrology Progress Note  Intolerant of iHD despite inc dose of NE, bolus with crystalloid.  MAPs in 40s. D/w KDU RN and will stop iHD and return to CRRT given severe azotemia.

## 2024-09-16 NOTE — Plan of Care (Signed)
  Problem: Education: Goal: Knowledge of General Education information will improve Description: Including pain rating scale, medication(s)/side effects and non-pharmacologic comfort measures Outcome: Progressing   Problem: Clinical Measurements: Goal: Respiratory complications will improve Outcome: Progressing   Problem: Nutrition: Goal: Adequate nutrition will be maintained Outcome: Progressing   Problem: Elimination: Goal: Will not experience complications related to bowel motility Outcome: Progressing   Problem: Skin Integrity: Goal: Risk for impaired skin integrity will decrease Outcome: Progressing   Problem: Fluid Volume: Goal: Hemodynamic stability will improve Outcome: Progressing   Problem: Respiratory: Goal: Ability to maintain adequate ventilation will improve Outcome: Progressing   Problem: Metabolic: Goal: Ability to maintain appropriate glucose levels will improve Outcome: Progressing   Problem: Tissue Perfusion: Goal: Adequacy of tissue perfusion will improve Outcome: Progressing

## 2024-09-16 NOTE — Progress Notes (Signed)
 eLink Physician-Brief Progress Note Patient Name: Rachel Lucas DOB: 1961/10/11 MRN: [id number]   Date of Service  09/16/2024  HPI/Events of Note  BSRN requesting for IV pain med as about to do extensive dressing change to leg/cellulitis  eICU Interventions  Morphine 1 mg ordered BP precaution     Intervention Category Intermediate Interventions: Pain - evaluation and management  Rachel Lucas 09/16/2024, 9:22 PM

## 2024-09-16 NOTE — Progress Notes (Incomplete)
 Patient ID: Rachel Lucas, female   DOB: October 08, 1961, 63 y.o.   MRN: [id number]    Progress Note   Subjective   Day # 13 CC; decompensated EtOH related cirrhosis, acute alcoholic hepatitis, nonresponsive to steroid/septic shock with DIC-gram-negative bacteremia, /acute renal failure ATN versus HRS/PEA arrest  Day 9 of 10 antibiotics/currently on meropenem   remains anuric-nephrology plans dialysis today  Palliative care met with husband yesterday, continue full scope of care  Labs today-WBC 29.5/hemoglobin 9.0/hematocrit 25.9/MCV 101/platelets 106 Sodium 142/potassium 4.3/BUN 143/creatinine 4.36/albumin  4.5 mg 3.2   Objective   Vital signs in last 24 hours: Temp:  [97.9 F (36.6 C)-99 F (37.2 C)] 98 F (36.7 C) (09/28 0748) Pulse Rate:  [66-106] 100 (09/28 0930) Resp:  [20-39] 31 (09/28 0930) BP: (80-105)/(45-63) 96/58 (09/28 0800) SpO2:  [93 %-98 %] 95 % (09/28 0930) Arterial Line BP: (103-137)/(43-58) 114/43 (09/28 0930) Last BM Date : 09/15/24 General:   Frail white female , deeply jaundiced Heart:  Regular rate and rhythm; no murmurs Lungs: Respirations even and unlabored, lungs CTA bilaterally Abdomen:  Soft, nontender and nondistended. Normal bowel sounds. Extremities:  Without edema. Neurologic:  Alert and oriented,  grossly normal neurologically. Psych:  Cooperative. Normal mood and affect.  Intake/Output from previous day: 09/27 0701 - 09/28 0700 In: 1555.1 [I.V.:45.1; NG/GT:1410; IV Piggyback:100] Out: 1200 [Stool:1200] Intake/Output this shift: Total I/O In: 205.6 [I.V.:5.6; NG/GT:200] Out: -   Lab Results: Recent Labs    09/14/24 0516 09/15/24 0839 09/15/24 0852 09/16/24 0408  WBC 35.8* 28.7*  --  29.5*  HGB 9.5* 9.1* 9.9* 9.0*  HCT 27.1* 25.8* 29.0* 25.9*  PLT 39* 58*  --  106*   BMET Recent Labs    09/15/24 0345 09/15/24 0852 09/15/24 1754 09/16/24 0408  NA 139 138 139 142  K 4.0 4.1 4.4 4.3  CL 97*  --  97* 101  CO2 25  --  19*  16*  GLUCOSE 123*  --  130* 147*  BUN 61*  --  110* 143*  CREATININE 2.22*  --  3.46* 4.36*  CALCIUM  10.5*  --  10.8* 10.8*   LFT Recent Labs    09/14/24 0516 09/14/24 1627 09/16/24 0408  PROT 7.3  --   --   ALBUMIN  4.3  4.4   < > 4.5  AST 79*  --   --   ALT 50*  --   --   ALKPHOS 203*  --   --   BILITOT 11.8*  --   --    < > = values in this interval not displayed.   PT/INR No results for input(s): LABPROT, INR in the last 72 hours.  Studies/Results: DG CHEST PORT 1 VIEW Result Date: 09/15/2024 CLINICAL DATA:  [id number] Acute hypoxemic respiratory failure (HCC) [id number] EXAM: PORTABLE CHEST 1 VIEW COMPARISON:  09/10/2024 FINDINGS: Central venous line and feeding tube unchanged. Interval removal of endotracheal to. Improved atelectasis at the LEFT lung base. No pulmonary edema. No pneumothorax. IMPRESSION: 1. Interval extubation.  Improved LEFT basilar atelectasis. 2. No pulmonary edema. Electronically Signed   By: Jackquline Boxer M.D.   On: 09/15/2024 09:16       Assessment / Plan:        Principal Problem:   Septic shock (HCC) Active Problems:   Decompensated liver disease (HCC)   AKI (acute kidney injury)   Severe malnutrition   Encephalopathy, hepatic (HCC)   Protein-calorie malnutrition, severe   Decompensated cirrhosis (HCC)   Hemodialysis status  LOS: 13 days   Hazeline Charnley EsterwoodPA-C  09/16/2024, 10:38 AM

## 2024-09-16 NOTE — Progress Notes (Signed)
 Progress Note   Assessment    63 year old female with decompensated alcohol induced cirrhosis, recent admission with alcoholic hepatitis, readmitted with lower extremity cellulitis leading to sepsis, bacteremia, AKI felt to be ATN initially requiring hemodialysis, hepatic and metabolic encephalopathy, malnutrition  Principal Problem:   Septic shock (HCC) Active Problems:   Decompensated liver disease (HCC)   AKI (acute kidney injury)   Severe malnutrition   Encephalopathy, hepatic (HCC)   Protein-calorie malnutrition, severe   Decompensated cirrhosis (HCC)   Hemodialysis status   Recommendations   1.  Decompensated alcohol-related cirrhosis --severely decompensated.  She remains quite ill in the ICU.  Her family is aware of the severity of her illness.  Her last alcohol intake was 08/07/2024.  She continues to fight to improve but is requiring renal replacement therapy switching back to continuous dialysis due to hypotension with iHD.  I have had thorough discussions with the patient's husband regarding the severity of her illness.  My hope is that she will make slow progress over the coming weeks and eventually be able to make it for liver transplant consideration -- Please check MELD labs daily; CBC, CMP and INR  2.  Encephalopathy --hepatic, metabolic plus delirium -- Continue lactulose  -- Rifaximin  stopped due to concerns of drug-induced skin injury -- Continue electrolyte replacement  3.  Gram-negative sepsis/cellulitis --she continues on meropenem , leukocytosis is slowly improving.  ID is following  3.  Acute renal failure --likely a component of ATN initially but cannot exclude an HRS component. -- Renal following and going back to CRRT/CVVHD  4.  Significant malnutrition --continue tube feedings, hypoalbuminemia in the setting of cirrhosis and critical illness  Given severity of illness and at the patient's family request GI will follow intermittently while she is  here.  We remain available if questions arise.  55 minutes total spent today including patient facing time, coordination of care, reviewing medical history/procedures/pertinent radiology studies, and documentation of the encounter.    Chief Complaint   Sister at bedside RN starting back CRRT Still on NE gtt Able to follow commands with me today Nonbloody BMs    Vital signs in last 24 hours: Temp:  [97.1 F (36.2 C)-99 F (37.2 C)] 98.2 F (36.8 C) (09/28 1530) Pulse Rate:  [74-106] 85 (09/28 1545) Resp:  [20-39] 26 (09/28 1545) BP: (69-131)/(34-69) 109/69 (09/28 1545) SpO2:  [92 %-98 %] 97 % (09/28 1545) Arterial Line BP: (84-144)/(27-59) 122/38 (09/28 1545) Weight:  [46 kg-46.4 kg] 46.4 kg (09/28 1424) Last BM Date : 09/15/24 Gen: jaundiced, ill-appearing HEENT: icteric, nasoenteric tube in place  CV: RRR, no mrg Pulm: CTA b/l Abd: soft, NT, +BS throughout Ext: no c/c, no edema  Intake/Output from previous day: 09/27 0701 - 09/28 0700 In: 1555.1 [I.V.:45.1; NG/GT:1410; IV Piggyback:100] Out: 1200 [Stool:1200] Intake/Output this shift: Total I/O In: 515.5 [I.V.:24.8; NG/GT:440.7; IV Piggyback:50] Out: 0   Lab Results: Recent Labs    09/14/24 0516 09/15/24 0839 09/15/24 0852 09/16/24 0408  WBC 35.8* 28.7*  --  29.5*  HGB 9.5* 9.1* 9.9* 9.0*  HCT 27.1* 25.8* 29.0* 25.9*  PLT 39* 58*  --  106*   BMET Recent Labs    09/15/24 0345 09/15/24 0852 09/15/24 1754 09/16/24 0408  NA 139 138 139 142  K 4.0 4.1 4.4 4.3  CL 97*  --  97* 101  CO2 25  --  19* 16*  GLUCOSE 123*  --  130* 147*  BUN 61*  --  110* 143*  CREATININE 2.22*  --  3.46* 4.36*  CALCIUM  10.5*  --  10.8* 10.8*   LFT Recent Labs    09/14/24 0516 09/14/24 1627 09/16/24 0408  PROT 7.3  --   --   ALBUMIN  4.3  4.4   < > 4.5  AST 79*  --   --   ALT 50*  --   --   ALKPHOS 203*  --   --   BILITOT 11.8*  --   --    < > = values in this interval not displayed.   PT/INR No results for  input(s): LABPROT, INR in the last 72 hours. Hepatitis Panel Recent Labs    09/14/24 0840  HEPBSAG NON REACTIVE    Studies/Results: DG CHEST PORT 1 VIEW Result Date: 09/15/2024 CLINICAL DATA:  [id number] Acute hypoxemic respiratory failure (HCC) [id number] EXAM: PORTABLE CHEST 1 VIEW COMPARISON:  09/10/2024 FINDINGS: Central venous line and feeding tube unchanged. Interval removal of endotracheal to. Improved atelectasis at the LEFT lung base. No pulmonary edema. No pneumothorax. IMPRESSION: 1. Interval extubation.  Improved LEFT basilar atelectasis. 2. No pulmonary edema. Electronically Signed   By: Jackquline Boxer M.D.   On: 09/15/2024 09:16      LOS: 13 days   Gordy [name] Starch, MD 09/16/2024, 4:29 PM See Rachel Lucas, [facility], to contact our on call provider

## 2024-09-16 NOTE — Progress Notes (Addendum)
 NAME:  Rachel Lucas, MRN:  [id number], DOB:  [birth year], LOS: 13 ADMISSION DATE:  09/03/2024, CONSULTATION DATE:  09/05/2024 REFERRING MD:  EDP, CHIEF COMPLAINT:  Left leg pain  History of Present Illness:  Patient is a 63 yo female with new diagnosis of alcoholic cirrhosis with septic shock thought 2/2 LLE cellulitis, does have persistent otitis media from recent prior admission for Klebsiella pneumonia and bacteremia.    Patient had concern for single episode of melena the day prior to admission, but subsequently had normal stools.  Had no cough, congestion, or hematemesis.  Did endorse worsening LLE swelling and redness with some pain the day of admission.   Recently, patient was admitted for Klebciella bacteremia and pneumonia in setting of accidental ear canal perforation with Q-tip (8/20-8/27).   In the ED, patient was unresponsive to fluids with BP 60/30s and PCCM was consulted for admission.  Patient stated that her baseline SBP is in the 90s.   Pertinent  Medical History  Fam hx of VWD but test 8/25 neg Alcoholic cirrhosis recent new diagnosis Last alcoholic drink 08/07/2024   Significant Hospital Events: Including procedures, antibiotic start and stop dates in addition to other pertinent events   9/15 - Admitted, working dx LE cellulitis and SBP in setting of nex dx of ETOH related cirrhosis.  started Zosyn  (9/15-) and linezolid  (9/15-), GI consulted with no concern for GIB, CVC and Art line placed in PM 9/16 - Worsening delirium, PEA arrest for 6 minutes with ROSC, NG tube in place 9/17 - EEG neg for seizure. ECHO: EF 35-40%. Became hyperglycemic after switch to D10 fluids. Had minimal urine output at 50 mL. Nephro consulted for CRRT. HD cath placed.  CRRT started. IR consulted. Felt not stable for para. Stating: coagulopathy, shock and hypothermia as factors. 9/18 - Lactic acid improving. Paracentesis, negative for SBP.  Vascular surgery consulted for cold LLE, no  interventions indicated, no evidence of limb ischemia 9/19 - ID consulted, added inflammatory marker eval raising concern for vasculitis. Pressors down significantly after starting the epinephrine . Cortrak placed. For intolerance of TF, inflammatory eval: sed rate 4, lactate 6.6->4.6. tbili 17 (rising from 11.9). wbc still elevated. Hgb 7.5 to 7. Plts 49->43 9/20 - hemodynamics a little better. Still w/ sig WBC ct. Prob steroids but repeating abd US  and CT imaging of head negative to r/o mastoiditis. 9/21 - All CT imaging including CT abdomen pelvis as well as CT temporal bone all reassuring no new evidence of infection.  Ultrasound abdomen negative.  Platelets down again, sending additional eval to rule out TTP.  Getting more platelets transfused.  Change abx to CTX and metronidazole . 9/22 - Off vasopressin , minimal sedation but remains encephalopathic. 9/23 - Off pressors, more alert and following instructions.  Weaning from vent. 9/24 - Overnight agitation and required norepi briefly but weaned and extubated in the AM, changed from CTX and metronidazole  to meropenem . 9/25 - Stable off ventilator, intermittent pressor requirements, discontinuing CRRT. 9/26 - Minimal pressor requirement, but could not fully wean. 9/27 - Desatted in the AM, required HFNC. 9/28 - HD planned for today. On 3 of levophed . Midodrine  dose increased to 10. On room air.   Interim History / Subjective:  Patient is doing well morning she seemed to have rested well.  She was sleeping in the morning but wakes up to me.  No distress.  She is on room air now.  Her arterial blood pressure is still low mainly because of the diastolic blood  pressure.  Plans for HD today.   Objective    Blood pressure (!) 96/58, pulse 100, temperature 98 F (36.7 C), temperature source Oral, resp. rate (!) 31, height 5' 3 (1.6 m), weight 44.7 kg, SpO2 95%.        Intake/Output Summary (Last 24 hours) at 09/16/2024 1057 Last data filed at  09/16/2024 0900 Gross per 24 hour  Intake 1516.02 ml  Output 1200 ml  Net 316.02 ml   Filed Weights   09/13/24 0500 09/14/24 0500 09/14/24 1330  Weight: 45.8 kg 44.7 kg 44.7 kg    Examination: General: Chronically ill-appearing, deconditioned.  Resting comfortably in bed, NAD, awake and alert. HEENT: [place] in place.  Scleral icterus.  Clear oropharynx.  Dry lips. Cardiovascular: Regular rate and rhythm. Normal S1/S2. No murmurs, rubs, or gallops appreciated. 2+ radial pulses. Pulmonary: Clear bilaterally to ascultation. No wheezes, crackles, or rhonchi. No increased WOB, tachypnea on room air Abdominal: No tenderness to deep or light palpation. No rebound or guarding, mildly distended. Skin: Warm and dry.  Jaundiced. Extremities: Trace peripheral edema bilaterally.  LLE with bullae and violaceous discoloration wrapped in Kerlix dressing, not draining through dressing.  Scattered violaceous discoloration and bullae across other extremities.  PT and DP pulses 2+ in BLE.  All distal extremities warm and perfusing.  Capillary refill 2 seconds. Neuro: Awake, following instructions.  Alert to self and place.  Not agitated.  Select labs and studies Cr 1.01>2.22>4.36 BUN 25>61>147 Phosphorus 1.9>5.8>8.5 ADAMTS13 activity 23.1%, antibody WNL CXR on 9/27 unchanged from prior, no pneumothorax, PNA, pulmonary edema    Resolved problem list  Septic shock Acute hypoxic respiratory failure 2/2 acute lung injury (extubated 9/24)   Assessment and Plan   Severe sepsis 2/2 GNR bacteremia further complicated by acute systolic heart failure WBC again increased (30.8>35.8) but remains stable off pressors >24 hours.  BCx 9/15 with GNRs and poor growth, repeat BCx 9/19 with NGTD.  Now on day 4 of meropenem  and ID plans for 7 days total. Plan: - Stress dose steroids tapered off 9/23 - Cultures from 9/15 sent to [facility] for better growth, solely GNRs noted - Meropenem  (9/24>), day 5 of 7 (last day  09/26/2024) - s/p Zosyn  (9/15-9/21), CTX (9/21>9/24), metronidazole  (9/21>9/24) - No GDMT given sepsis - PPI daily - Midodrine  dose will be increased to 10 mg 3 times daily to facilitate weaning from norepinephrine    Hypoxemia -resolved likely due to atelectasis  - Q1h incentive spirometry, encourage up in bed - Continue PT/OT as able - Obtain CTA if acutely worsening SpO2, Nephrology agrees with contrast if emergent need    LLE bullae and mottling Possible this is secondary to cellulitis versus volume overload, now remains stable.  Considered SJS on 9/22 and discontinued rifaximin , low concern overall for this condition.  Neg for DVT on 9/17, felt exacerbated by hydrostatic pressure and volume overload. Evaluated by vascular on 9/18 and 9/19 with no concern for limb ischemia. No eosinophilia on CBC w/ diff. Plan: - May ultimately require advanced wound care therapies if recovers from acute illness - Close pulse checks - Appreciate wound care assistance - Volume removal w/ CRRT continuing - Plastic surgery consulted 9/26 and will continue to follow     Extubated 9/24 Dysphagia Now extubated and stable on room air.  SLP still recommending NPO given severe aspiration risk. Plan: - NPO - Meds per tube and IV - Oral care QID - SLP following - PT eval and treat  AKI 2/2 ischemic ATN post PEA arrest Acute renal failure on HD Hyperphosphatemia, intermittent Remains anuric.  Rapidly rising BUN and phosphorus.  Given azotemia, Nephro expects HD will be needed tomorrow. Plan: - Strict I&Os - Foley removed 9/24 - Renal panels BID - HD possibly 9/28 per Nephro - Consider adjusting feeds to renal diet     Decompensated alcoholic cirrhosis with ascites, Hx gastric varices, and superimposed shock liver Hepatic encephalopathy ICU delirium Encephalopathy improved, sleep-wake cycle is dysregulated but improving.  1300 mL flatus pouch output past 24 hours.  Likely component of metabolic  encephalopathy and ICU delirium given nighttime agitation. Plan: - Decreased lactulose  to 20 mg TID, follow with ammonia as needed - Rifaximin  discontinued as above given concern for SJS - Monitor frequency of BMs daily - Continue to reorient, delirium precautions - Holding home propranolol  and spiro in setting of sepsis - Minimize sedation as able -Check ammonia levels in the a.m. - Per GI 9/25, if patient is able to recover and discharge, maintain abstinence from EtOH, will need to follow up with [facility] or [facility] for transplant consideration     Critical illness related protein calorie malnutrition Liquid stools Appreciate RD consult, post-pyloric Cortrack placed 9/20. Plan: - Advance trickle feeds via Cortrak as able - Stopped stool softeners as she is on lactulose  and think gut edema affecting absorption - Flatus pouch in place - Consider low phosphorus tube feed as above     Chronic macrocytic anemia Status post 1 unit PRBC 9/16 and 9/20. Hemoglobin now climbing. Plan: - Holding AC given coagulopathy - AM CBC - Transfusion threshold Hgb <7     Acute on chronic thrombocytopenia Coagulopathy (septic coagulopathy/DIC superimposed on decompensated cirrhosis) At least 1 troponin 06 today.  Without evidence of frank bleeding.  Received platelets 9/17, again prior to paracentesis on 9/18, linezolid  stopped 9/18, again received platelets 9/21.  Also not a candidate for [facility] given coagulopathy. ADAMTS13 activity low but above 10% diagnostic level and negative antibody, do not suspect TTP.  Haptoglobin low and LDH elevated, suggesting some hemolysis. Plan: - Heme following, appreciate recs - Plt transfusion threshold <20 per Heme - If needs procedures, threshold for Plts >50 - Avoiding AC given coagulopathy and wounds with high risk for bleeding, unable to place SCDs due to LE wounds - Follow INR every 3 days, daily CBC     Hypothyroidism Stable chronic problem. Plan: - Continue  levothyroxine  88 mcg     Glycemic control Now off steroids, mildly hyperglycemic.  Tube feeds continuing.  A1c 4.3. 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 Tobie with [facility] 9/16, who related that he has a no suspicion for mastoiditis after reviewing CT imaging.  Noted that patient has a packing wick in place and TM is open, so fluid would be expected but does not suspect mastoiditis based on radiography. Repeated imaging 9/20 improved aeration of middle ear cavity with no evidence of mastoiditis. Plan: - Ciprodex  drops changed to ofloxacin  (family request and cleared w/ ENT) both ears (started 9/20) - Monitor postauricular area for proptosis   GI PPx: Pantoprazole  changed back to daily given no GIB DVT PPx: Heparin  held given thrombocytopenia, unable to place SCDs given LE wounds  Patient's husband Lamar and sister were in the room I spoke with them and updated them on the clinical course.  Labs  CBC: Recent Labs  Lab 09/12/24 0333 09/13/24 0418 09/14/24 0516 09/15/24 0839 09/15/24 0852 09/16/24 0408  WBC 35.2* 30.8* 35.8* 28.7*  --  29.5*  NEUTROABS  --  26.1*  --   --   --   --   HGB 9.5* 10.0* 9.5* 9.1* 9.9* 9.0*  HCT 27.0* 28.4* 27.1* 25.8* 29.0* 25.9*  MCV 100.4* 100.4* 101.5* 101.6*  --  101.6*  PLT 38* 31* 39* 58*  --  106*    Basic Metabolic Panel: Recent Labs  Lab 09/11/24 0335 09/11/24 0339 09/12/24 0333 09/12/24 0808 09/12/24 1523 09/13/24 0418 09/13/24 1524 09/14/24 0516 09/14/24 1627 09/15/24 0345 09/15/24 0852 09/15/24 1754 09/16/24 0408  NA 141  141   < > 141 138   < > 137   < > 142  140 137 139 138 139 142  K 3.1*  3.1*   < > 3.2* 4.2   < > 3.7   < > 4.0  4.0 3.1* 4.0 4.1 4.4 4.3  CL 103  105   < > 106 106   < > 104   < > 103  104 96* 97*  --  97* 101  CO2 23  23   < > 24 23   < > 21*   < > 19*  18* 24 25  --  19* 16*   GLUCOSE 150*  151*   < > 147* 135*   < > 165*   < > 139*  136* 166* 123*  --  130* 147*  BUN 20  20   < > 24* 24*   < > 27*   < > 91*  89* 25* 61*  --  110* 143*  CREATININE 1.03*  1.10*   < > 1.15* 1.17*   < > 1.14*   < > 2.73*  2.82* 1.01* 2.22*  --  3.46* 4.36*  CALCIUM  9.0  9.0   < > 8.9 9.0   < > 9.6   < > 10.4*  10.4* 10.4* 10.5*  --  10.8* 10.8*  MG 2.4  --  2.4 2.5*  --  2.5*  --   --   --   --   --   --  3.2*  PHOS 1.8*   < > 2.3*  --    < > 2.6   < > 5.8* 1.9* 5.8*  --  8.2* 8.5*   < > = values in this interval not displayed.   GFR: Estimated Creatinine Clearance: 9.7 mL/min (A) (by C-G formula based on SCr of 4.36 mg/dL (H)). Recent Labs  Lab 09/13/24 0418 09/14/24 0516 09/15/24 0839 09/15/24 0840 09/15/24 0847 09/16/24 0408  WBC 30.8* 35.8* 28.7*  --   --  29.5*  LATICACIDVEN  --   --   --  1.8 2.0*  --     Liver Function Tests: Recent Labs  Lab 09/10/24 0408 09/10/24 1518 09/11/24 0335 09/11/24 1556 09/12/24 0333 09/12/24 1523 09/13/24 0418 09/13/24 1524 09/14/24 0516 09/14/24 1627 09/15/24 0345 09/15/24 1754 09/16/24 0408  AST 92*  --  84*  --  77*  --  89*  --  79*  --   --   --   --   ALT 50*  --  48*  --  50*  --  60*  --  50*  --   --   --   --   ALKPHOS 92  --  104  --  157*  --  171*  --  203*  --   --   --   --   BILITOT 18.5*  --  14.1*  --  13.0*  --  14.9*  --  11.8*  --   --   --   --   PROT 7.2  --  6.5  --  7.0  --  8.1  --  7.3  --   --   --   --   ALBUMIN  5.2*   < > 4.6  4.5   < > 4.5  4.4   < > 4.9  4.9   < > 4.3  4.4 6.5* 5.1* 4.9 4.5   < > = values in this interval not displayed.   No results for input(s): LIPASE, AMYLASE in the last 168 hours. Recent Labs  Lab 09/11/24 0336 09/14/24 1100  AMMONIA 64* 49*    ABG    Component Value Date/Time   PHART 7.502 (H) 09/15/2024 0852   PCO2ART 31.8 (L) 09/15/2024 0852   PO2ART 59 (L) 09/15/2024 0852   HCO3 25.1 09/15/2024 0852   TCO2 26 09/15/2024 0852   ACIDBASEDEF  1.0 09/07/2024 0500   O2SAT 94 09/15/2024 0852     Coagulation Profile: Recent Labs  Lab 09/10/24 0408 09/11/24 0335 09/12/24 0333 09/13/24 0418  INR 2.9* 2.9* 3.1* 2.5*    Cardiac Enzymes: No results for input(s): CKTOTAL, CKMB, CKMBINDEX, TROPONINI in the last 168 hours.  HbA1C: Hgb A1c MFr Bld  Date/Time Value Ref Range Status  09/10/2024 04:08 AM 4.3 (L) 4.8 - 5.6 % Final    Comment:    (NOTE) Diagnosis of Diabetes The following HbA1c ranges recommended by the American Diabetes Association (ADA) may be used as an aid in the diagnosis of diabetes mellitus.  Hemoglobin             Suggested A1C NGSP%              Diagnosis  <5.7                   Non Diabetic  5.7-6.4                Pre-Diabetic  >6.4                   Diabetic  <7.0                   Glycemic control for                       adults with diabetes.      CBG: Recent Labs  Lab 09/15/24 1513 09/15/24 1950 09/15/24 2338 09/16/24 0331 09/16/24 0743  GLUCAP 110* 132* 163* 166* 116*    Past Medical History:  She,  has a past medical history of Hypothyroidism, Thyroid  disease, and Von Willebrand disease (HCC).   Surgical History:   Past Surgical History:  Procedure Laterality Date   CYST REMOVAL NECK  1995   IR PARACENTESIS  09/06/2024     Social History:   reports that she quit smoking about 34 years ago. Her smoking use included cigarettes. She started smoking about 44 years ago. She has a 2 pack-year smoking history. She does not have any smokeless tobacco history on file. She reports that she does not currently use alcohol. She reports that she does not use drugs.   Family History:  Her family history includes Asthma in her mother; Eczema in her mother; Heart Problems in  her father.   Allergies Allergies  Allergen Reactions   Levofloxacin Other (See Comments)   Sulfonamide Derivatives Palpitations     Home Medications  Prior to Admission medications   Medication Sig  Start Date End Date Taking? Authorizing Provider  folic acid  (FOLVITE ) 1 MG tablet Take 1 tablet (1 mg total) by mouth daily. 08/16/24  Yes Dennise Lavada POUR, MD  furosemide  (LASIX ) 20 MG tablet Take 1 tablet (20 mg total) by mouth daily. 08/30/24  Yes Craig Palma R, PA-C  lactulose , encephalopathy, (ENULOSE ) 10 GM/15ML SOLN Take 15 mLs (10 g total) by mouth 2 (two) times daily. 08/21/24  Yes Federico Rosario BROCKS, MD  levothyroxine  (SYNTHROID ) 88 MCG tablet Take 1 tablet (88 mcg total) by mouth daily before breakfast. 08/15/24  Yes Singh, Prashant K, MD  midodrine  (PROAMATINE ) 5 MG tablet Take 1 tablet (5 mg total) by mouth 2 (two) times daily with a meal. 08/30/24  Yes Craig Palma SAUNDERS, PA-C  propranolol  (INDERAL ) 10 MG tablet Take 1 tablet (10 mg total) by mouth 2 (two) times daily. 08/30/24  Yes Craig Palma SAUNDERS, PA-C  spironolactone  (ALDACTONE ) 50 MG tablet Take 1 tablet (50 mg total) by mouth daily. 08/30/24  Yes Craig Palma R, PA-C  thiamine  (VITAMIN B1) 100 MG tablet Take 1 tablet (100 mg total) by mouth daily. 08/16/24  Yes Singh, Prashant K, MD  potassium chloride  SA (KLOR-CON  M) 20 MEQ tablet Take 1 tablet (20 mEq total) by mouth daily for 2 days. 08/21/24 08/31/24  Federico Rosario BROCKS, MD   35 minutes of critical care time spent on caring for this patient excluding procedures.  Tamela Stakes, MD  Attending Physician, Critical Care Medicine [facility] See Amion for pager If no response to pager, please call [phone] until 7pm After 7pm, Please call E-link [phone]   09/16/24, 10:57 AM

## 2024-09-16 NOTE — Progress Notes (Signed)
 eLink Physician-Brief Progress Note Patient Name: Rachel Lucas DOB: [birth year] MRN: [id number]   Date of Service  09/16/2024  HPI/Events of Note  Received request for Flexiseal Platelets > 100 No contraindication identified nor reported  eICU Interventions  Flexiseal ordered Bedside rounding team to reassess when this can be appropriately removed     Intervention Category Intermediate Interventions: Other:  Damien ONEIDA Grout 09/16/2024, 7:33 PM

## 2024-09-17 ENCOUNTER — Inpatient Hospital Stay (HOSPITAL_COMMUNITY)

## 2024-09-17 DIAGNOSIS — I5020 Unspecified systolic (congestive) heart failure: Secondary | ICD-10-CM | POA: Diagnosis not present

## 2024-09-17 DIAGNOSIS — J9601 Acute respiratory failure with hypoxia: Secondary | ICD-10-CM | POA: Diagnosis not present

## 2024-09-17 DIAGNOSIS — K7030 Alcoholic cirrhosis of liver without ascites: Secondary | ICD-10-CM | POA: Diagnosis not present

## 2024-09-17 DIAGNOSIS — A419 Sepsis, unspecified organism: Secondary | ICD-10-CM | POA: Diagnosis not present

## 2024-09-17 DIAGNOSIS — Z515 Encounter for palliative care: Secondary | ICD-10-CM

## 2024-09-17 LAB — POCT I-STAT 7, (LYTES, BLD GAS, ICA,H+H)
Acid-Base Excess: 0 mmol/L (ref 0.0–2.0)
Acid-Base Excess: 4 mmol/L — ABNORMAL HIGH (ref 0.0–2.0)
Acid-Base Excess: 1 mmol/L (ref 0.0–2.0)
Acid-Base Excess: 3 mmol/L — ABNORMAL HIGH (ref 0.0–2.0)
Bicarbonate: 22.5 mmol/L (ref 20.0–28.0)
Bicarbonate: 26.4 mmol/L (ref 20.0–28.0)
Bicarbonate: 22.9 mmol/L (ref 20.0–28.0)
Bicarbonate: 24.7 mmol/L (ref 20.0–28.0)
Calcium, Ion: 1.16 mmol/L (ref 1.15–1.40)
Calcium, Ion: 1.13 mmol/L — ABNORMAL LOW (ref 1.15–1.40)
Calcium, Ion: 1.14 mmol/L — ABNORMAL LOW (ref 1.15–1.40)
Calcium, Ion: 1.14 mmol/L — ABNORMAL LOW (ref 1.15–1.40)
HCT: 31 % — ABNORMAL LOW (ref 36.0–46.0)
HCT: 29 % — ABNORMAL LOW (ref 36.0–46.0)
HCT: 31 % — ABNORMAL LOW (ref 36.0–46.0)
HCT: 34 % — ABNORMAL LOW (ref 36.0–46.0)
Hemoglobin: 10.5 g/dL — ABNORMAL LOW (ref 12.0–15.0)
Hemoglobin: 9.9 g/dL — ABNORMAL LOW (ref 12.0–15.0)
Hemoglobin: 10.5 g/dL — ABNORMAL LOW (ref 12.0–15.0)
Hemoglobin: 11.6 g/dL — ABNORMAL LOW (ref 12.0–15.0)
O2 Saturation: 90 %
O2 Saturation: 100 %
O2 Saturation: 94 %
O2 Saturation: 95 %
Patient temperature: 98.1
Patient temperature: 96.8
Patient temperature: 97.8
Patient temperature: 99
Potassium: 4.4 mmol/L (ref 3.5–5.1)
Potassium: 5.1 mmol/L (ref 3.5–5.1)
Potassium: 4.7 mmol/L (ref 3.5–5.1)
Potassium: 5.2 mmol/L — ABNORMAL HIGH (ref 3.5–5.1)
Sodium: 141 mmol/L (ref 135–145)
Sodium: 136 mmol/L (ref 135–145)
Sodium: 138 mmol/L (ref 135–145)
Sodium: 137 mmol/L (ref 135–145)
TCO2: 23 mmol/L (ref 22–32)
TCO2: 27 mmol/L (ref 22–32)
TCO2: 24 mmol/L (ref 22–32)
TCO2: 26 mmol/L (ref 22–32)
pCO2 arterial: 28 mmHg — ABNORMAL LOW (ref 32–48)
pCO2 arterial: 31.2 mmHg — ABNORMAL LOW (ref 32–48)
pCO2 arterial: 27.6 mmHg — ABNORMAL LOW (ref 32–48)
pCO2 arterial: 29.2 mmHg — ABNORMAL LOW (ref 32–48)
pH, Arterial: 7.511 — ABNORMAL HIGH (ref 7.35–7.45)
pH, Arterial: 7.531 — ABNORMAL HIGH (ref 7.35–7.45)
pH, Arterial: 7.525 — ABNORMAL HIGH (ref 7.35–7.45)
pH, Arterial: 7.536 — ABNORMAL HIGH (ref 7.35–7.45)
pO2, Arterial: 50 mmHg — ABNORMAL LOW (ref 83–108)
pO2, Arterial: 146 mmHg — ABNORMAL HIGH (ref 83–108)
pO2, Arterial: 59 mmHg — ABNORMAL LOW (ref 83–108)
pO2, Arterial: 67 mmHg — ABNORMAL LOW (ref 83–108)

## 2024-09-17 LAB — RENAL FUNCTION PANEL
Albumin: 4.7 g/dL (ref 3.5–5.0)
Albumin: 4.7 g/dL (ref 3.5–5.0)
Albumin: 4.4 g/dL (ref 3.5–5.0)
Anion gap: 13 (ref 5–15)
Anion gap: 13 (ref 5–15)
Anion gap: 14 (ref 5–15)
BUN: 70 mg/dL — ABNORMAL HIGH (ref 6–20)
BUN: 57 mg/dL — ABNORMAL HIGH (ref 6–20)
BUN: 49 mg/dL — ABNORMAL HIGH (ref 6–20)
CO2: 20 mmol/L — ABNORMAL LOW (ref 22–32)
CO2: 21 mmol/L — ABNORMAL LOW (ref 22–32)
CO2: 21 mmol/L — ABNORMAL LOW (ref 22–32)
Calcium: 9.7 mg/dL (ref 8.9–10.3)
Calcium: 9.8 mg/dL (ref 8.9–10.3)
Calcium: 9.4 mg/dL (ref 8.9–10.3)
Chloride: 104 mmol/L (ref 98–111)
Chloride: 102 mmol/L (ref 98–111)
Chloride: 100 mmol/L (ref 98–111)
Creatinine, Ser: 2.22 mg/dL — ABNORMAL HIGH (ref 0.44–1.00)
Creatinine, Ser: 1.77 mg/dL — ABNORMAL HIGH (ref 0.44–1.00)
Creatinine, Ser: 1.45 mg/dL — ABNORMAL HIGH (ref 0.44–1.00)
GFR, Estimated: 25 mL/min — ABNORMAL LOW (ref >60)
GFR, Estimated: 33 mL/min — ABNORMAL LOW (ref >60)
GFR, Estimated: 41 mL/min — ABNORMAL LOW (ref >60)
Glucose, Bld: 124 mg/dL — ABNORMAL HIGH (ref 70–99)
Glucose, Bld: 135 mg/dL — ABNORMAL HIGH (ref 70–99)
Glucose, Bld: 173 mg/dL — ABNORMAL HIGH (ref 70–99)
Phosphorus: 4.6 mg/dL (ref 2.5–4.6)
Phosphorus: 4.3 mg/dL (ref 2.5–4.6)
Phosphorus: 4.1 mg/dL (ref 2.5–4.6)
Potassium: 4.4 mmol/L (ref 3.5–5.1)
Potassium: 4.5 mmol/L (ref 3.5–5.1)
Potassium: 4.6 mmol/L (ref 3.5–5.1)
Sodium: 137 mmol/L (ref 135–145)
Sodium: 136 mmol/L (ref 135–145)
Sodium: 135 mmol/L (ref 135–145)

## 2024-09-17 LAB — GLUCOSE, CAPILLARY
Glucose-Capillary: 129 mg/dL — ABNORMAL HIGH (ref 70–99)
Glucose-Capillary: 150 mg/dL — ABNORMAL HIGH (ref 70–99)
Glucose-Capillary: 155 mg/dL — ABNORMAL HIGH (ref 70–99)
Glucose-Capillary: 155 mg/dL — ABNORMAL HIGH (ref 70–99)
Glucose-Capillary: 156 mg/dL — ABNORMAL HIGH (ref 70–99)
Glucose-Capillary: 195 mg/dL — ABNORMAL HIGH (ref 70–99)

## 2024-09-17 LAB — CBC
HCT: 27.2 % — ABNORMAL LOW (ref 36.0–46.0)
Hemoglobin: 9.6 g/dL — ABNORMAL LOW (ref 12.0–15.0)
MCH: 35.6 pg — ABNORMAL HIGH (ref 26.0–34.0)
MCHC: 35.3 g/dL (ref 30.0–36.0)
MCV: 100.7 fL — ABNORMAL HIGH (ref 80.0–100.0)
Platelets: 133 K/uL — ABNORMAL LOW (ref 150–400)
RBC: 2.7 MIL/uL — ABNORMAL LOW (ref 3.87–5.11)
RDW: 27.4 % — ABNORMAL HIGH (ref 11.5–15.5)
WBC: 24.8 K/uL — ABNORMAL HIGH (ref 4.0–10.5)
nRBC: 1.8 % — ABNORMAL HIGH (ref 0.0–0.2)

## 2024-09-17 LAB — POCT ACTIVATED CLOTTING TIME: Activated Clotting Time: 0 s

## 2024-09-17 LAB — CALCIUM, IONIZED
Calcium, Ionized, Serum: 5.1 mg/dL (ref 4.5–5.6)
Calcium, Ionized, Serum: 5.1 mg/dL (ref 4.5–5.6)
Calcium, Ionized, Serum: 4.8 mg/dL (ref 4.5–5.6)

## 2024-09-17 LAB — HEPATIC FUNCTION PANEL
ALT: 48 U/L — ABNORMAL HIGH (ref 0–44)
AST: 122 U/L — ABNORMAL HIGH (ref 15–41)
Albumin: 4.6 g/dL (ref 3.5–5.0)
Alkaline Phosphatase: 266 U/L — ABNORMAL HIGH (ref 38–126)
Bilirubin, Direct: 3.8 mg/dL — ABNORMAL HIGH (ref 0.0–0.2)
Indirect Bilirubin: 5.5 mg/dL — ABNORMAL HIGH (ref 0.3–0.9)
Total Bilirubin: 9.3 mg/dL — ABNORMAL HIGH (ref 0.0–1.2)
Total Protein: 8.3 g/dL — ABNORMAL HIGH (ref 6.5–8.1)

## 2024-09-17 LAB — MAGNESIUM: Magnesium: 2.8 mg/dL — ABNORMAL HIGH (ref 1.7–2.4)

## 2024-09-17 LAB — LACTIC ACID, PLASMA: Lactic Acid, Venous: 2.2 mmol/L (ref 0.5–1.9)

## 2024-09-17 LAB — AMMONIA: Ammonia: 70 umol/L — ABNORMAL HIGH (ref 9–35)

## 2024-09-17 LAB — PROTIME-INR
INR: 2.2 — ABNORMAL HIGH (ref 0.8–1.2)
Prothrombin Time: 25.4 s — ABNORMAL HIGH (ref 11.4–15.2)

## 2024-09-17 MED ORDER — VASOPRESSIN 20 UNITS/100 ML INFUSION FOR SHOCK
0.0000 [IU]/min | INTRAVENOUS | Status: DC
  Administered 2024-09-17 (×2): 0.03 [IU]/min via INTRAVENOUS
  Filled 2024-09-17 (×2): qty 100

## 2024-09-17 MED ORDER — SODIUM CHLORIDE 3 % IN NEBU
4.0000 mL | INHALATION_SOLUTION | RESPIRATORY_TRACT | Status: DC | PRN
Start: 2024-09-17 — End: 2024-09-20

## 2024-09-17 MED ORDER — LACTATED RINGERS IV BOLUS
500.0000 mL | Freq: Once | INTRAVENOUS | Status: AC
  Administered 2024-09-17: 500 mL via INTRAVENOUS

## 2024-09-17 MED ORDER — HEPARIN SODIUM (PORCINE) 5000 UNIT/ML IJ SOLN
5000.0000 [IU] | Freq: Three times a day (TID) | INTRAMUSCULAR | Status: DC
  Administered 2024-09-17 (×2): 5000 [IU] via SUBCUTANEOUS
  Filled 2024-09-17 (×2): qty 1

## 2024-09-17 MED ORDER — VASOPRESSIN 20 UNITS/100 ML INFUSION FOR SHOCK
INTRAVENOUS | Status: AC
  Filled 2024-09-17: qty 100

## 2024-09-17 NOTE — Progress Notes (Signed)
 OT Cancellation Note  Patient Details Name: Rachel Lucas MRN: [id number] DOB: August 14, 1961   Cancelled Treatment:    Reason Eval/Treat Not Completed: Medical issues which prohibited therapy (Patient with increase in O2 demand, currently on CRRT, on pressors, and decreased cognition. OT to hold treatment today and reattempt tomorrow.)  Jeb LITTIE Laine 09/17/2024, 12:15 PM  Dick Laine, OTA Acute Rehabilitation Services  Office [phone]

## 2024-09-17 NOTE — Progress Notes (Signed)
 Nutrition Follow-up  DOCUMENTATION CODES:  Severe malnutrition in context of acute illness/injury, Underweight  INTERVENTION:  Continue TF via Cortrak: Vital 1.5 at 71ml/hr ( per day) 60ml ProSource TF20 BID Provides 1600 kcal, 105g protein, free water  daily    Renal MVI with minerals daily   NUTRITION DIAGNOSIS:  Severe Malnutrition related to acute illness (PEA arrest, recent diagnosis of alcoholic cirrhosis) as evidenced by severe muscle depletion, severe fat depletion. - remains applicable  GOAL:  Patient will meet greater than or equal to 90% of their needs - goal met via TF  MONITOR:  Labs, Weight trends, TF tolerance, Skin, I & O's  REASON FOR ASSESSMENT:  Consult (CRRT protocol) Other (Comment) (CRRT protocol)  ASSESSMENT:  Pt admitted with c/o LLE swelling and redness. PMH significant for new diagnosis of alcoholic cirrhosis with septic shock though 2/2 LLE cellulitis, persistent otitis media, recent admission for klebsiella bacteremia and PNA.  9/15: admitted 9/16: PEA arrest for 6 minutes with ROSC 9/17: repeat chest xray with ARDS; CRRT initiated for acidosis, shock, worsening AKI and volume status  9/18: s/p paracentesis- yield 1.3L; no evidence of SBP 9/19: post pyloric Cortrak placed (suspected in the LOT per imaging) 9/24: extubated 9/25: discontinue CRRT 9/28: resumed CRRT  PMT following peripherally. In patient discussion on 9/27- continue full scope of care with goal for medical stabilization and recovery.   Pt discussed in interdisciplinary rounds.  Pt did not tolerate iHD d/t hypotension and azotemia and returned back to CRRT.  Heart rate and blood pressure has been variable today.  Low threshold for re-intubation.    Spoke with pt's husband at bedside. Continues on tube feeding at goal rate via Cortrak. Abdomen soft, non distended and having bowel movements on lactulose .  No nutrition concerns per husband at this time. Pt mouths hi  to RD but appears deconditioned and does not otherwise interact with RD.   First measured weight (9/16): 63.7 kg Current weight: 46.4 kg  I/O's: CRRT x24 hours Net since admission -  MAP (a-line): 54-78 mmHg x12 hours  Medications: folvite , lactulose  20g TID, rena-vit, thiamine  Drips: Levo @ 9 mcg/min Vaso @ 0.03 units/min  Lab:  BUN 57 Cr 1.77 Ionized calcium  1.13 Mg 2.8 Alkaline phosphatase 266 AST 122 ALT 48 Ammonia 70 Total bilirubin 9.3 GFR 33 CBG's 123-195 x24 hours  Diet Order:   Diet Order             Diet NPO time specified Except for: Ice Chips, Sips with Meds  Diet effective now                   EDUCATION NEEDS:   No education needs have been identified at this time  Skin:  Skin Assessment: Reviewed RN Assessment  Last BM:  9/29 via FMS- quantity not reported  Height:   Ht Readings from Last 1 Encounters:  09/07/24 5' 3 (1.6 m)    Weight:   Wt Readings from Last 1 Encounters:  09/16/24 46.4 kg    Ideal Body Weight:  52.3 kg  BMI:  Body mass index is 18.12 kg/m.  Estimated Nutritional Needs:   Kcal:  1500-1700  Protein:  95-105g  Fluid:  1L + UOP  Royce Maris, RDN, LDN Clinical Nutrition See AMiON for contact information.

## 2024-09-17 NOTE — Progress Notes (Signed)
 eLink Physician-Brief Progress Note Patient Name: Rachel Lucas DOB: [birth year] MRN: [id number]   Date of Service  09/17/2024  HPI/Events of Note  RT requesting for hypertonic 3% neb Unable to pass nasal airway for NT suctioning  eICU Interventions  Hypertonic neb ordered CXR ordered     Intervention Category Intermediate Interventions: Other:  Damien ONEIDA Grout 09/17/2024, 4:22 AM

## 2024-09-17 NOTE — Progress Notes (Addendum)
 eLink Physician-Brief Progress Note Patient Name: Rachel Lucas DOB: 02/08/1961 MRN: [id number]   Date of Service  09/17/2024  HPI/Events of Note  called d/t tachypnea and decreased mental status. staring off,  and worsening o2 demands last night and today  CRRT going on. Cirrhosis, shock. S/p extubation from 24 th.   Camera: RR 30's. 9 levo, vaso 0.03 Not in pain, on 7 lit o2. AMS. Able to protect her airways. NG tube. Weak cough. Sats ok.   eICU Interventions  Get ABG Asp precautions.       Intervention Category Intermediate Interventions: Respiratory distress - evaluation and management Minor Interventions: Agitation / anxiety - evaluation and management  Jodelle ONEIDA Hutching 09/17/2024, 8:30 PM  21:39 ABG : Persisting reps alkaosis , stable though at pco2 at 29.  Now on o2 at 10 lit for pao2 69. Not in pain  Discussed with RN. Continue care  0351: Camera high alert: Hypotension on HFNC, appear comfortable. Sats ok.  HR 89.  Stat neo synephrine 100 mcg, LR 500 ml bolus. Get CVP before and after LR bolus. CxR from 29 th images seen, did not see any effusion or obvious CHF.  Should able to handle fluids. On 2 pressors.encephalopathy.  Escalate levophed  totration faster for now.  Get a stat ABG.  Full code.  Ground ccu team notified. They going to bed side also.   04:18 Camera follow up. MAP > 65. Post ne 160 mcg bolus and LR. CVP -4, but hooked to dialysis catheter, so may not be right. RR 25. Lungs sound clear per RN stethoscope check. -ABG resp alkalosis, stable, improved P/F ratio. Pao2 > 80.   0435  Camera: Hypotension again. Neo 100 mcg stick.  Called ground team to bed side,  Started epi drip. 3 rd pressor, received 1 mg epi.   04:50 NP Rahul talked to husband over phone. In multi organ failure, 3 pressors, CRRT, AHRF. Frail. - husband visiting bed side soon. Mean time no escalation of care.

## 2024-09-17 NOTE — Progress Notes (Signed)
 SLP Cancellation Note  Patient Details Name: Rachel Lucas MRN: [id number] DOB: [birth year]   Cancelled treatment:       Reason Eval/Treat Not Completed: Medical issues which prohibited therapy (on CRRT with increased O2 requirements). Discussed with RN, who states pt is not able to participate in SLP interventions today. SLP will f/u.    Damien Blumenthal, M.A., CCC-SLP Speech Language Pathology, Acute Rehabilitation Services  Secure Chat preferred [phone]  09/17/2024, 9:24 AM

## 2024-09-17 NOTE — Progress Notes (Signed)
   09/17/24 0345  Therapy Vitals  Pulse Rate (!) 59  Resp (!) 32  MEWS Score/Color  MEWS Score 6  MEWS Score Color Red  Respiratory Assessment  Assessment Type Assess only  Respiratory Pattern Regular;Unlabored  Chest Assessment Chest expansion symmetrical  Cough Weak;Productive  Sputum Amount Small  Sputum Color Tan  Sputum Consistency Thick  Sputum Specimen Source (S)  Nasal tracheal  Bilateral Breath Sounds Diminished;Rhonchi  Oxygen Therapy/Pulse Ox  O2 Device Room Air  SpO2 93 %   Pt NTSx3 with small tan thick secretions removed. Pt continues to have rhonchi breath sounds. CPT via bed started.

## 2024-09-17 NOTE — Progress Notes (Signed)
 eLink Physician-Brief Progress Note Patient Name: Rachel Lucas DOB: October 07, 1961 MRN: [id number]   Date of Service  09/17/2024  HPI/Events of Note  Received request for NT suctioning Chest physiotherapy had minimal improvement Sats 87%  eICU Interventions  NT suctioning ordered prn     Intervention Category Intermediate Interventions: Other:  Rachel Lucas 09/17/2024, 3:33 AM

## 2024-09-17 NOTE — Plan of Care (Signed)
     Referral previously received for Rachel Lucas for goals of care discussion. Noted most recent palliative in-person assessment dated 09/15/2024 at which time it was recommended to follow from a distance/chart check.  Chart reviewed for Recent provider notes, nurse notes, vitals, labs, and imaging and updates received from RN.   At this time patient appears to be having some decline.  She did not tolerate iHD and is back on CRRT.  Her MELD score today is approximately 37.  Pressor needs and oxygen needs have increased.  However, previous conversations with husband indicate desire for full code and full scope including intubation and resuscitation.  I will continue to monitor the chart and if the patient does have further decline, especially if she ends up intubated, I will reengage with the patient's husband to again discuss goals of care.  No plan for in person follow-up today. Please contact the palliative medicine provider on service for any new/urgent needs that require our assistance with this patient.  Thank you for your referral and allowing PMT to assist in Rachel Lucas care.   Camellia Kays, NP Palliative Medicine Team Phone: [phone]  NO CHARGE

## 2024-09-17 NOTE — Progress Notes (Signed)
 Admit: 09/03/2024 LOS: 14  16F anuric AKI 2/2 cardiac arrest, septic shock, GNR bacteremia, decompensated cirrhosis, ARDS  S: Did not tolerate iHD yesterday, despite increased dose of pressor support and crystalloid bolus.  Stopped IHD and restarted CRRT.  Per nurse, no CRRT issues. Required morphine 1 mg overnight due to leg cellulitis No UOP Remains on pressor support. Long discussion with husband.  She is alert, not responsive.  Does not follow or answer questions. BUN down to 70, K 4.4, bicarbonate 20 with anion gap 13  O: 09/28 0701 - 09/29 0700 In: 1613.7 [I.V.:133.7; NG/GT:1230; IV Piggyback:250] Out: 1679.8 [Emesis/NG output:200]  Filed Weights   09/16/24 1331 09/16/24 1421 09/16/24 1424  Weight: 46 kg 46.4 kg 46.4 kg    Recent Labs  Lab 09/16/24 0408 09/16/24 1720 09/17/24 0413 09/17/24 0534  NA 142 142 137 141  K 4.3 3.9 4.4 4.4  CL 101 103 104  --   CO2 16* 19* 20*  --   GLUCOSE 147* 116* 124*  --   BUN 143* 130* 70*  --   CREATININE 4.36* 3.93* 2.22*  --   CALCIUM  10.8* 9.5 9.7  --   PHOS 8.5* 6.6* 4.6  --    Recent Labs  Lab 09/13/24 0418 09/14/24 0516 09/15/24 0839 09/15/24 0852 09/16/24 0408 09/17/24 0413 09/17/24 0534  WBC 30.8*   < > 28.7*  --  29.5* 24.8*  --   NEUTROABS 26.1*  --   --   --   --   --   --   HGB 10.0*   < > 9.1*   < > 9.0* 9.6* 10.5*  HCT 28.4*   < > 25.8*   < > 25.9* 27.2* 31.0*  MCV 100.4*   < > 101.6*  --  101.6* 100.7*  --   PLT 31*   < > 58*  --  106* 133*  --    < > = values in this interval not displayed.    Scheduled Meds:  Chlorhexidine  Gluconate Cloth  6 each Topical Q0600   feeding supplement (PROSource TF20)  60 mL Per Tube BID   folic acid   1 mg Per Tube Daily   lactulose   20 g Per Tube TID   levothyroxine   88 mcg Per Tube QAC breakfast   midodrine   10 mg Per Tube TID WC   multivitamin  1 tablet Per Tube QHS   ofloxacin   5 drop Both EARS Daily   pantoprazole  (PROTONIX ) IV  40 mg Intravenous Q24H   sodium  chloride flush  10-40 mL Intracatheter Q12H   thiamine   100 mg Per Tube Daily   Continuous Infusions:  feeding supplement (VITAL 1.5 CAL) 40 mL/hr at 09/17/24 0700   meropenem  (MERREM ) IV Stopped (09/17/24 0553)   norepinephrine  (LEVOPHED ) Adult infusion 11 mcg/min (09/17/24 0700)   prismasol  BGK 4/2.5 400 mL/hr at 09/17/24 0233   prismasol  BGK 4/2.5 400 mL/hr at 09/17/24 0233   prismasol  BGK 4/2.5 1,000 mL/hr at 09/17/24 0233   PRN Meds:.artificial tears, diphenhydrAMINE , heparin , ipratropium-albuterol , melatonin, OLANZapine , ondansetron  (ZOFRAN ) IV, mouth rinse, [START ON 09/30/2024] pneumococcal 20-valent conjugate vaccine, sodium chloride , sodium chloride  flush, sodium chloride  HYPERTONIC  ABG    Component Value Date/Time   PHART 7.511 (H) 09/17/2024 0534   PCO2ART 28.0 (L) 09/17/2024 0534   PO2ART 50 (L) 09/17/2024 0534   HCO3 22.5 09/17/2024 0534   TCO2 23 09/17/2024 0534   ACIDBASEDEF 1.0 09/07/2024 0500   O2SAT 90 09/17/2024 0534  Chronically ill-appearing, jaundiced Awake, noninteractive Coarse breath sounds bilaterally Regular, normal S1 and S2 1-2+ lower extremity edema  A/P  Dialysis dependent anuric AKI from ATN on CRRT through 09/13/2024, tolerated iHD 9/26; did not tolerate iHD 9/28 -> CRRT, 9/28 Severe azotemia/catabolic state in addition to low GFR causing high BUN Cardiac arrest PEA 9/16 Septic shock with previous GNR bacteremia on/off vasopressors,  AHRF/ARDS per CCM, extubated 9/24 Decompensated alcoholic cirrhosis with portal hypertension, variceal disease  Coagulopathy, thrombocytopenia, hematology following, DIC favored, TTP unlikely Hypo/Hyperphosphatemia: resolved currently   Medication Issues:   o Preferred narcotic agents for pain control are hydromorphone, fentanyl , and methadone. Morphine should not be used.    o Baclofen should be avoided   o Avoid oral sodium phosphate  and magnesium  citrate based laxatives / bowel preps   Assessment and plan  discussed with primary team  Evalene [name] Lanes, DO  [facility]

## 2024-09-17 NOTE — Progress Notes (Signed)
 NAME:  Rachel Lucas, MRN:  [id number], DOB:  02/05/1961, LOS: 14 ADMISSION DATE:  09/03/2024, CONSULTATION DATE:  09/05/2024 REFERRING MD:  EDP, CHIEF COMPLAINT:  Left leg pain  History of Present Illness:  Patient is a 63 yo female with new diagnosis of alcoholic cirrhosis with septic shock thought 2/2 LLE cellulitis, does have persistent otitis media from recent prior admission for Klebsiella pneumonia and bacteremia.    Patient had concern for single episode of melena the day prior to admission, but subsequently had normal stools.  Had no cough, congestion, or hematemesis.  Did endorse worsening LLE swelling and redness with some pain the day of admission.   Recently, patient was admitted for Klebciella bacteremia and pneumonia in setting of accidental ear canal perforation with Q-tip (8/20-8/27).   In the ED, patient was unresponsive to fluids with BP 60/30s and PCCM was consulted for admission.  Patient stated that her baseline SBP is in the 90s.   Pertinent  Medical History  Fam hx of VWD but test 8/25 neg Alcoholic cirrhosis recent new diagnosis Last alcoholic drink 08/07/2024   Significant Hospital Events: Including procedures, antibiotic start and stop dates in addition to other pertinent events   9/15 - Admitted, working dx LE cellulitis and SBP in setting of nex dx of ETOH related cirrhosis.  started Zosyn  (9/15-) and linezolid  (9/15-), GI consulted with no concern for GIB, CVC and Art line placed in PM 9/16 - Worsening delirium, PEA arrest for 6 minutes with ROSC, NG tube in place 9/17 - EEG neg for seizure. ECHO: EF 35-40%. Became hyperglycemic after switch to D10 fluids. Had minimal urine output at 50 mL. Nephro consulted for CRRT. HD cath placed.  CRRT started. IR consulted. Felt not stable for para. Stating: coagulopathy, shock and hypothermia as factors. 9/18 - Lactic acid improving. Paracentesis, negative for SBP.  Vascular surgery consulted for cold LLE, no  interventions indicated, no evidence of limb ischemia 9/19 - ID consulted, added inflammatory marker eval raising concern for vasculitis. Pressors down significantly after starting the epinephrine . Cortrak placed. For intolerance of TF, inflammatory eval: sed rate 4, lactate 6.6->4.6. tbili 17 (rising from 11.9). wbc still elevated. Hgb 7.5 to 7. Plts 49->43 9/20 - hemodynamics a little better. Still w/ sig WBC ct. Prob steroids but repeating abd US  and CT imaging of head negative to r/o mastoiditis. 9/21 - All CT imaging including CT abdomen pelvis as well as CT temporal bone all reassuring no new evidence of infection.  Ultrasound abdomen negative.  Platelets down again, sending additional eval to rule out TTP.  Getting more platelets transfused.  Change abx to CTX and metronidazole . 9/22 - Off vasopressin , minimal sedation but remains encephalopathic. 9/23 - Off pressors, more alert and following instructions.  Weaning from vent. 9/24 - Overnight agitation and required norepi briefly but weaned and extubated in the AM, changed from CTX and metronidazole  to meropenem . 9/25 - Stable off ventilator, intermittent pressor requirements, discontinuing CRRT. 9/26 - Minimal pressor requirement, but could not fully wean. 9/27 - Desatted in the AM, briefly required HFNC, CXR unchanged, returned to room air after with incentive spirometry 9/28 - Significant azotemia, restarted CRRT 9/29 - Again hypoxic requiring HFNC, CXR unchanged, ABG improved with oxygenation and stable; pressor requirement increasing   Interim History / Subjective:  Overnight, the patient's oxygen requirement increased due to desaturations and she was placed on HFNC.  RT suctioned patient and requested hypertonic saline neb.  Chest physiotherapy did not provide improvement.  This AM, patient's mental status is worsened; she is not able to follow instructions or respond to questions.  She is tracking movement with eyes, has an intact cough  to suction.   Objective    Blood pressure (!) 99/49, pulse 70, temperature 98.1 F (36.7 C), temperature source Oral, resp. rate (!) 29, height 5' 3 (1.6 m), weight 46.4 kg, SpO2 99%.        Intake/Output Summary (Last 24 hours) at 09/17/2024 0730 Last data filed at 09/17/2024 0700 Gross per 24 hour  Intake 1613.71 ml  Output 1679.8 ml  Net -66.09 ml   Filed Weights   09/16/24 1331 09/16/24 1421 09/16/24 1424  Weight: 46 kg 46.4 kg 46.4 kg    Examination: General: Chronically ill-appearing, deconditioned, resting in bed, mild respiratory distress on HFNC. HEENT: Scleral icterus and mild edema. Cardiovascular: Tachycardic, irregular rhythm, frequent PACs on monitor. Normal S1/S2. No murmurs or rubs. 2+ radial pulses. Pulmonary: Clear bilaterally to ascultation. No wheezes, crackles, or rhonchi. Tachypneic on nonrebreather HFNC 15 L/min. Abdominal: Mildly distended.  Present bowel sounds.  Flexiseal in place and draining. Skin: Warm and dry.  Scattered violaceous discoloration/purpura across chest and extremities. Extremities: Trace peripheral edema bilaterally. Observed dressing change of LLE, noted multiple ruptured bullae and extensive purpura of extremity.  Capillary refill 2 seconds. Neuro: Tracking movements, rousable to stimuli.  Not following instructions, not responding to questions consistently.  Select labs and studies pO2 50, pH 7.511 Ammonia 49>70 CXR unchanged from 9/27, no evidence of PNA, pneumothorax, pulmonary edema Albumin  4.6   Resolved problem list  Septic shock Acute hypoxic respiratory failure 2/2 acute lung injury (extubated 9/24)   Assessment and Plan   Severe sepsis 2/2 GNR bacteremia further complicated by acute systolic heart failure Leukocytes now downtrending, but has again required pressors for past several hours.  BCx 9/15 with GNRs and poor growth, repeat BCx 9/19 with NGTD.  Now on day 4 of meropenem  and ID plans for 7 days  total. Plan: - Stress dose steroids tapered off 9/23 - Cultures from 9/15 sent to [facility] for better growth, solely GNRs noted - Meropenem  (9/24>), day 6 of 7 (last day 09/24/2024) - s/p Zosyn  (9/15-9/21), CTX (9/21>9/24), metronidazole  (9/21>9/24) - No GDMT given sepsis - PPI daily - Midodrine  10 mg TID - Norepinephrine  titrated to goal MAP>65 - Added vasopressin  this AM     Acute hypoxic respiratory failure Briefly hypoxic on 9/27, suspected 2/2 atelectasis.  Again hypoxic this AM with initial PaO2 50 and on non-rebreather HFNC 15 L/min.  CXR this AM without pneumothorax, pulmonary edema, pneumonia; unchanged from 9/27.  Repeat ABG improved, no intubation yet, but possible in future. Suspect contribution of atelectasis.  Reportedly with increasing secretions, s/p hypertonic saline neb overnight.  Possible PE, but not currently stable for CTA. - Q1h incentive spirometry, encourage up in bed - Continue PT/OT as able - Chest physiotherapy per RT - Not stable for CTA at this time, consider obtaining if hypoxemia persists, Nephrology agrees with contrast if emergent need   Tachycardia PACs Increased frequency of ectopic beats since stating CRRT 9/28, though norepinephrine  increasing. - CTM closely - Optimize electrolytes; goal K>4, Mag>2    LLE bullae and mottling Possible this is secondary to cellulitis versus volume overload, now remains stable.  Considered SJS on 9/22 and discontinued rifaximin , low concern overall for this condition.  Neg for DVT on 9/17, felt exacerbated by hydrostatic pressure and volume overload. Evaluated by vascular on 9/18 and 9/19 with  no concern for limb ischemia.  No eosinophilia on CBC w/ diff. Plan: - May ultimately require advanced wound care therapies if recovers from acute illness - Close pulse checks - Appreciate wound care assistance - Volume removal w/ CRRT continuing - Plastic surgery consulted 9/26 and will continue to follow     Extubated  9/24 Dysphagia Now extubated and stable on room air.  SLP still recommending NPO given severe aspiration risk. Plan: - NPO - Meds per tube and IV - Oral care QID - SLP following - PT eval and treat     AKI 2/2 ischemic ATN post PEA arrest Acute renal failure on CRRT Hyperphosphatemia, intermittent Remains anuric.  Rapidly rising BUN and phosphorus.  Given azotemia, CRRT was restarted 9/28. Plan: - Strict I&Os - Foley removed 9/24 - Renal panels BID - HD possibly 9/28 per Nephro - Consider adjusting feeds to renal diet     Decompensated alcoholic cirrhosis with ascites, Hx gastric varices, and superimposed shock liver Hepatic encephalopathy ICU delirium Encephalopathy improved, sleep-wake cycle is dysregulated but improving.  1300 mL flatus pouch output past 24 hours.  Likely component of metabolic encephalopathy and ICU delirium given nighttime agitation. Plan: - Decreased lactulose  to 20 mg TID, follow with ammonia as needed - Rifaximin  discontinued as above given concern for SJS - Monitor frequency of BMs daily - Continue to reorient, delirium precautions - Holding home propranolol  and spiro in setting of sepsis - Minimize sedation as able -Check ammonia levels in the a.m. - Per GI 9/25, if patient is able to recover and discharge, maintain abstinence from EtOH, will need to follow up with [facility] or [facility] for transplant consideration     Critical illness related protein calorie malnutrition Liquid stools Appreciate RD consult, post-pyloric Cortrack placed 9/20. Plan: - Advance trickle feeds via Cortrak as able - Stopped stool softeners as she is on lactulose  and think gut edema affecting absorption - Flatus pouch in place - Consider low phosphorus tube feed as above     Chronic macrocytic anemia Status post 1 unit PRBC 9/16 and 9/20. Hemoglobin now climbing. Plan: - Holding AC given coagulopathy - AM CBC - Transfusion threshold Hgb <7     Acute on chronic  thrombocytopenia Coagulopathy (septic coagulopathy/DIC superimposed on decompensated cirrhosis) At least 1 troponin 06 today.  Without evidence of frank bleeding.  Received platelets 9/17, again prior to paracentesis on 9/18, linezolid  stopped 9/18, again received platelets 9/21.  Also not a candidate for [facility] given coagulopathy. ADAMTS13 activity low but above 10% diagnostic level and negative antibody, do not suspect TTP.  Haptoglobin low and LDH elevated, suggesting some hemolysis. Plan: - Heme following, appreciate recs - Plt transfusion threshold <20 per Heme - If needs procedures, threshold for Plts >50 - Avoiding AC given coagulopathy and wounds with high risk for bleeding, unable to place SCDs due to LE wounds - Follow INR every 3 days, daily CBC     Hypothyroidism Stable chronic problem. Plan: - Continue levothyroxine  88 mcg     Glycemic control Now off steroids, mildly hyperglycemic.  Tube feeds continuing.  A1c 4.3. 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 Tobie with [facility] 9/16, who related that he has a no suspicion for mastoiditis after reviewing CT imaging.  Noted that patient has a packing wick in place and TM is open, so fluid  would be expected but does not suspect mastoiditis based on radiography. Repeated imaging 9/20 improved aeration of middle ear cavity with no evidence of mastoiditis. Plan: - Ciprodex  drops changed to ofloxacin  (family request and cleared w/ ENT) both ears (started 9/20) - Monitor postauricular area for proptosis     GI PPx: Pantoprazole  changed back to daily given no GIB DVT PPx: Heparin  held given thrombocytopenia, unable to place SCDs given LE wounds Family update: At bedside, discussed possibility of reintubation and family agreed if emergent need   Labs   CBC: Recent Labs  Lab 09/13/24 0418 09/14/24 0516 09/15/24 0839  09/15/24 0852 09/16/24 0408 09/17/24 0413 09/17/24 0534  WBC 30.8* 35.8* 28.7*  --  29.5* 24.8*  --   NEUTROABS 26.1*  --   --   --   --   --   --   HGB 10.0* 9.5* 9.1* 9.9* 9.0* 9.6* 10.5*  HCT 28.4* 27.1* 25.8* 29.0* 25.9* 27.2* 31.0*  MCV 100.4* 101.5* 101.6*  --  101.6* 100.7*  --   PLT 31* 39* 58*  --  106* 133*  --     Basic Metabolic Panel: Recent Labs  Lab 09/12/24 0333 09/12/24 0808 09/12/24 1523 09/13/24 0418 09/13/24 1524 09/15/24 0345 09/15/24 0852 09/15/24 1754 09/16/24 0408 09/16/24 1720 09/17/24 0413 09/17/24 0534  NA 141 138   < > 137   < > 139   < > 139 142 142 137 141  K 3.2* 4.2   < > 3.7   < > 4.0   < > 4.4 4.3 3.9 4.4 4.4  CL 106 106   < > 104   < > 97*  --  97* 101 103 104  --   CO2 24 23   < > 21*   < > 25  --  19* 16* 19* 20*  --   GLUCOSE 147* 135*   < > 165*   < > 123*  --  130* 147* 116* 124*  --   BUN 24* 24*   < > 27*   < > 61*  --  110* 143* 130* 70*  --   CREATININE 1.15* 1.17*   < > 1.14*   < > 2.22*  --  3.46* 4.36* 3.93* 2.22*  --   CALCIUM  8.9 9.0   < > 9.6   < > 10.5*  --  10.8* 10.8* 9.5 9.7  --   MG 2.4 2.5*  --  2.5*  --   --   --   --  3.2*  --  2.8*  --   PHOS 2.3*  --    < > 2.6   < > 5.8*  --  8.2* 8.5* 6.6* 4.6  --    < > = values in this interval not displayed.   GFR: Estimated Creatinine Clearance: 19.7 mL/min (A) (by C-G formula based on SCr of 2.22 mg/dL (H)). Recent Labs  Lab 09/14/24 0516 09/15/24 0839 09/15/24 0840 09/15/24 0847 09/16/24 0408 09/17/24 0413 09/17/24 0512  WBC 35.8* 28.7*  --   --  29.5* 24.8*  --   LATICACIDVEN  --   --  1.8 2.0*  --   --  2.2*    Liver Function Tests: Recent Labs  Lab 09/11/24 0335 09/11/24 1556 09/12/24 0333 09/12/24 1523 09/13/24 0418 09/13/24 1524 09/14/24 0516 09/14/24 1627 09/15/24 0345 09/15/24 1754 09/16/24 0408 09/16/24 1720 09/17/24 0413  AST 84*  --  77*  --  89*  --  79*  --   --   --   --   --  122*  ALT 48*  --  50*  --  60*  --  50*  --   --   --    --   --  48*  ALKPHOS 104  --  157*  --  171*  --  203*  --   --   --   --   --  266*  BILITOT 14.1*  --  13.0*  --  14.9*  --  11.8*  --   --   --   --   --  9.3*  PROT 6.5  --  7.0  --  8.1  --  7.3  --   --   --   --   --  8.3*  ALBUMIN  4.6  4.5   < > 4.5  4.4   < > 4.9  4.9   < > 4.3  4.4   < > 5.1* 4.9 4.5 4.6 4.6  4.7   < > = values in this interval not displayed.   No results for input(s): LIPASE, AMYLASE in the last 168 hours. Recent Labs  Lab 09/11/24 0336 09/14/24 1100 09/17/24 0512  AMMONIA 64* 49* 70*    ABG    Component Value Date/Time   PHART 7.511 (H) 09/17/2024 0534   PCO2ART 28.0 (L) 09/17/2024 0534   PO2ART 50 (L) 09/17/2024 0534   HCO3 22.5 09/17/2024 0534   TCO2 23 09/17/2024 0534   ACIDBASEDEF 1.0 09/07/2024 0500   O2SAT 90 09/17/2024 0534     Coagulation Profile: Recent Labs  Lab 09/11/24 0335 09/12/24 0333 09/13/24 0418 09/17/24 0413  INR 2.9* 3.1* 2.5* 2.2*    Cardiac Enzymes: No results for input(s): CKTOTAL, CKMB, CKMBINDEX, TROPONINI in the last 168 hours.  HbA1C: Hgb A1c MFr Bld  Date/Time Value Ref Range Status  09/10/2024 04:08 AM 4.3 (L) 4.8 - 5.6 % Final    Comment:    (NOTE) Diagnosis of Diabetes The following HbA1c ranges recommended by the American Diabetes Association (ADA) may be used as an aid in the diagnosis of diabetes mellitus.  Hemoglobin             Suggested A1C NGSP%              Diagnosis  <5.7                   Non Diabetic  5.7-6.4                Pre-Diabetic  >6.4                   Diabetic  <7.0                   Glycemic control for                       adults with diabetes.      CBG: Recent Labs  Lab 09/16/24 1128 09/16/24 1528 09/16/24 1933 09/16/24 2334 09/17/24 0414  GLUCAP 143* 145* 123* 137* 129*    Past Medical History:  She,  has a past medical history of Hypothyroidism, Thyroid  disease, and Von Willebrand disease (HCC).   Surgical History:   Past Surgical  History:  Procedure Laterality Date   CYST REMOVAL NECK  1995   IR PARACENTESIS  09/06/2024     Social History:   reports that she  quit smoking about 34 years ago. Her smoking use included cigarettes. She started smoking about 44 years ago. She has a 2 pack-year smoking history. She does not have any smokeless tobacco history on file. She reports that she does not currently use alcohol. She reports that she does not use drugs.   Family History:  Her family history includes Asthma in her mother; Eczema in her mother; Heart Problems in her father.   Allergies Allergies  Allergen Reactions   Levofloxacin Other (See Comments)   Sulfonamide Derivatives Palpitations     Home Medications  Prior to Admission medications   Medication Sig Start Date End Date Taking? Authorizing Provider  folic acid  (FOLVITE ) 1 MG tablet Take 1 tablet (1 mg total) by mouth daily. 08/16/24  Yes Dennise Lavada POUR, MD  furosemide  (LASIX ) 20 MG tablet Take 1 tablet (20 mg total) by mouth daily. 08/30/24  Yes Craig Palma R, PA-C  lactulose , encephalopathy, (ENULOSE ) 10 GM/15ML SOLN Take 15 mLs (10 g total) by mouth 2 (two) times daily. 08/21/24  Yes Federico Rosario BROCKS, MD  levothyroxine  (SYNTHROID ) 88 MCG tablet Take 1 tablet (88 mcg total) by mouth daily before breakfast. 08/15/24  Yes Singh, Prashant K, MD  midodrine  (PROAMATINE ) 5 MG tablet Take 1 tablet (5 mg total) by mouth 2 (two) times daily with a meal. 08/30/24  Yes Craig Palma R, PA-C  propranolol  (INDERAL ) 10 MG tablet Take 1 tablet (10 mg total) by mouth 2 (two) times daily. 08/30/24  Yes Craig Palma R, PA-C  spironolactone  (ALDACTONE ) 50 MG tablet Take 1 tablet (50 mg total) by mouth daily. 08/30/24  Yes Craig Palma SAUNDERS, PA-C  thiamine  (VITAMIN B1) 100 MG tablet Take 1 tablet (100 mg total) by mouth daily. 08/16/24  Yes Singh, Prashant K, MD  potassium chloride  SA (KLOR-CON  M) 20 MEQ tablet Take 1 tablet (20 mEq total) by mouth daily for 2 days. 08/21/24  08/31/24  Federico Rosario BROCKS, MD     Kortez Murtagh Toma, MD PGY-2, ICU resident 09/17/24, 7:30 AM

## 2024-09-17 NOTE — Progress Notes (Signed)
 Chest PT held today due to increase need for pressors and patients hemodynamic status.

## 2024-09-17 NOTE — Progress Notes (Signed)
 [facility]  Antibiotics  Meropenem : 9/24 - Present  Ceftriaxone : 9/21 - 9/24  Metronidazole : 9/21 - 9/24 Zosyn : 9/15 - 9/21 Vancomycin  9/15 - 9/19 Rifaximin  9/15 - 9/22     Assessment and Plan: The patient is a 63 year old female with newly diagnosed alcoholic cirrhosis who was recently admitted in August 2025 for Klebsiella bacteremia. She was then readmitted on September 03, 2024 with septic shock in the setting of acute 1 day left leg swelling/redness and geographic bullae/erythematous macule. The left leg had vascular surgery input and no acute vascular occlusion determinted. The rash had fairly looked the same. There are purpura on the arms. She remains with multiorgan failure (aki requiring crrt), septic shock on pressor, and with dic like pictures vs decompensated cirrhosis. Hospital course has been complicated by a gram negative bacteremia that was not able to be identified. Blood cultures were sent out to [facility] for help with identification. The patient is currently on Meropenem    - Finish 10 days of Meropenem  on 09/21/24 - ID will follow up on blood cultures from [facility] and adjust accordingly     Patient Active Problem List   Diagnosis Date Noted   Palliative care by specialist 09/17/2024   Protein-calorie malnutrition, severe 09/14/2024   Decompensated cirrhosis (HCC) 09/14/2024   Hemodialysis status 09/14/2024   Severe malnutrition 09/13/2024   Encephalopathy, hepatic (HCC) 09/13/2024   AKI (acute kidney injury) 09/05/2024   Septic shock (HCC) 09/03/2024   Esophageal varices without bleeding (HCC) 08/14/2024   Alcoholic cirrhosis (HCC) 08/13/2024   Bleeding from left ear 08/12/2024   Melena 08/12/2024   Blood transfusion reaction-febrile illness 08/09/2024   Thrombocytopenia 08/09/2024   Alcoholic hepatitis 08/09/2024   E coli bacteremia 08/09/2024   Bacteremia due to Enterobacter species 08/09/2024   Hyponatremia 08/09/2024   Anemia  due to folic acid  deficiency 08/09/2024   Decompensated liver disease (HCC) 08/08/2024   Cough 07/13/2016   Mild intermittent asthma 07/13/2016   Palpitations 03/14/2009   HYPOTHYROIDISM, HX OF 03/14/2009    Current Discharge Medication List     CONTINUE these medications which have NOT CHANGED   Details  folic acid  (FOLVITE ) 1 MG tablet Take 1 tablet (1 mg total) by mouth daily. Qty: 30 tablet, Refills: 0    furosemide  (LASIX ) 20 MG tablet Take 1 tablet (20 mg total) by mouth daily. Qty: 90 tablet, Refills: 1    lactulose , encephalopathy, (ENULOSE ) 10 GM/15ML SOLN Take 15 mLs (10 g total) by mouth 2 (two) times daily. Qty: 946 mL, Refills: 2   Associated Diagnoses: Alcoholic hepatitis, unspecified whether ascites present    levothyroxine  (SYNTHROID ) 88 MCG tablet Take 1 tablet (88 mcg total) by mouth daily before breakfast. Qty: 30 tablet, Refills: 1    midodrine  (PROAMATINE ) 5 MG tablet Take 1 tablet (5 mg total) by mouth 2 (two) times daily with a meal. Qty: 60 tablet, Refills: 0    propranolol  (INDERAL ) 10 MG tablet Take 1 tablet (10 mg total) by mouth 2 (two) times daily. Qty: 30 tablet, Refills: 0    spironolactone  (ALDACTONE ) 50 MG tablet Take 1 tablet (50 mg total) by mouth daily. Qty: 30 tablet, Refills: 3    thiamine  (VITAMIN B1) 100 MG tablet Take 1 tablet (100 mg total) by mouth daily. Qty: 30 tablet, Refills: 0    potassium chloride  SA (KLOR-CON  M) 20 MEQ tablet Take 1 tablet (20 mEq total) by mouth daily for 2 days. Qty: 2 tablet,  Refills: 0       Subjective: The patient is lying in bed in no apparent distress. She is extubated but on HFNC. Pressor requirements are also increasing and she is now on 11 of Levophed  and 0.03 of Vasopressin . The patient is only tracking with her eyes at this time and was unable to answer questions or follow commands. Sister at bedside updated. Blood cultures that were sent to [facility] have still not resulted.   Review of  Systems: Unable to be assessed   Past Medical History:  Diagnosis Date   Hypothyroidism    Thyroid  disease    Von Willebrand disease (HCC)    Social History   Tobacco Use   Smoking status: Former    Current packs/day: 0.00    Average packs/day: 0.2 packs/day for 10.0 years (2.0 ttl pk-yrs)    Types: Cigarettes    Start date: 12/21/1979    Quit date: 12/20/1989    Years since quitting: 34.7   Tobacco comments:    Pt also notes passive exposure- father smoked in house growing up.   Vaping Use   Vaping status: Never Used  Substance Use Topics   Alcohol use: Not Currently   Drug use: Never   Family History  Problem Relation Age of Onset   Asthma Mother    Eczema Mother    Heart Problems Father    Allergies  Allergen Reactions   Levofloxacin Other (See Comments)   Sulfonamide Derivatives Palpitations   Objective: Vitals:   09/17/24 1500 09/17/24 1537 09/17/24 1600 09/17/24 1700  BP:      Pulse: 79 84 80 (!) 187  Resp: (!) 33 (!) 31 (!) 34 (!) 28  Temp:  97.8 F (36.6 C)    TempSrc:  Axillary    SpO2: 94% 94% 94% 91%  Weight:      Height:       Body mass index is 18.12 kg/m.  General: Chronically ill appearing female lying in bed in no apparent distress  HENT: Moist mucous membranes, normal nose, normal external ears, and normocephalic Neck: Supple, trachea midline, and normal cervical range of motion Eyes: PERRL, EOMI, Scleral icterus. Normal conjunctivae and lids Lungs: Clear to auscultation bilaterally. No wheezing, rales or rhonchi Cardiac: Tachycardic. Irregular rhythm. No murmurs, rubs or gallops. Trace peripheral edema  Abdomen: Mildly distended, active bowel sounds Skin: Jaundiced. Purpura noted to her neck and lower extremities  GU: Foley Musculoskeletal: No obvious skeletal abnormalities Neuro: Alert but did not answer any questions. Tried to answer. Extremely weak. Not following commands  Psych: Unable to be assessed    Problem List Items Addressed  This Visit       Digestive   Decompensated liver disease (HCC) - Primary     Genitourinary   AKI (acute kidney injury)    Evalene Munch, MD [facility]  09/17/2024, 5:11 PM

## 2024-09-17 NOTE — Progress Notes (Signed)
 PT Cancellation Note  Patient Details Name: Rachel Lucas MRN: [id number] DOB: 1961-09-23   Cancelled Treatment:    Reason Eval/Treat Not Completed: (P) Medical issues which prohibited therapy Pt with an increase in O2 demand, currently on CRRT, on pressors, with decreased cognition. Therapy held today. PT will follow back tomorrow to determine appropriateness.   Rachel Lucas PT, DPT Acute Rehabilitation Services Please use secure chat or  Call Office [phone]    Rachel Lucas [facility] 09/17/2024, 11:52 AM

## 2024-09-18 ENCOUNTER — Inpatient Hospital Stay (HOSPITAL_COMMUNITY)

## 2024-09-18 ENCOUNTER — Encounter (HOSPITAL_COMMUNITY): Payer: Self-pay | Admitting: Internal Medicine

## 2024-09-18 DIAGNOSIS — D65 Disseminated intravascular coagulation [defibrination syndrome]: Secondary | ICD-10-CM | POA: Insufficient documentation

## 2024-09-18 DIAGNOSIS — R7881 Bacteremia: Secondary | ICD-10-CM | POA: Insufficient documentation

## 2024-09-18 DIAGNOSIS — A419 Sepsis, unspecified organism: Secondary | ICD-10-CM | POA: Diagnosis not present

## 2024-09-18 DIAGNOSIS — D539 Nutritional anemia, unspecified: Secondary | ICD-10-CM | POA: Insufficient documentation

## 2024-09-18 DIAGNOSIS — R6521 Severe sepsis with septic shock: Secondary | ICD-10-CM | POA: Diagnosis not present

## 2024-09-18 DIAGNOSIS — Z66 Do not resuscitate: Secondary | ICD-10-CM | POA: Diagnosis not present

## 2024-09-18 DIAGNOSIS — N179 Acute kidney failure, unspecified: Secondary | ICD-10-CM | POA: Insufficient documentation

## 2024-09-18 DIAGNOSIS — I5021 Acute systolic (congestive) heart failure: Secondary | ICD-10-CM | POA: Insufficient documentation

## 2024-09-18 DIAGNOSIS — J9601 Acute respiratory failure with hypoxia: Secondary | ICD-10-CM | POA: Insufficient documentation

## 2024-09-18 HISTORY — DX: Do not resuscitate: Z66

## 2024-09-18 LAB — POCT I-STAT 7, (LYTES, BLD GAS, ICA,H+H)
Acid-Base Excess: 0 mmol/L (ref 0.0–2.0)
Bicarbonate: 21 mmol/L (ref 20.0–28.0)
Calcium, Ion: 1.13 mmol/L — ABNORMAL LOW (ref 1.15–1.40)
HCT: 30 % — ABNORMAL LOW (ref 36.0–46.0)
Hemoglobin: 10.2 g/dL — ABNORMAL LOW (ref 12.0–15.0)
O2 Saturation: 98 %
Patient temperature: 99
Potassium: 5.1 mmol/L (ref 3.5–5.1)
Sodium: 137 mmol/L (ref 135–145)
TCO2: 22 mmol/L (ref 22–32)
pCO2 arterial: 24.5 mmHg — ABNORMAL LOW (ref 32–48)
pH, Arterial: 7.543 — ABNORMAL HIGH (ref 7.35–7.45)
pO2, Arterial: 86 mmHg (ref 83–108)

## 2024-09-18 LAB — CBC
HCT: 22.4 % — ABNORMAL LOW (ref 36.0–46.0)
Hemoglobin: 7.6 g/dL — ABNORMAL LOW (ref 12.0–15.0)
MCH: 35.7 pg — ABNORMAL HIGH (ref 26.0–34.0)
MCHC: 33.9 g/dL (ref 30.0–36.0)
MCV: 105.2 fL — ABNORMAL HIGH (ref 80.0–100.0)
Platelets: 118 K/uL — ABNORMAL LOW (ref 150–400)
RBC: 2.13 MIL/uL — ABNORMAL LOW (ref 3.87–5.11)
WBC: 19.9 K/uL — ABNORMAL HIGH (ref 4.0–10.5)
nRBC: 6.2 % — ABNORMAL HIGH (ref 0.0–0.2)

## 2024-09-18 LAB — PROTIME-INR
INR: 2.5 — ABNORMAL HIGH (ref 0.8–1.2)
Prothrombin Time: 28.3 s — ABNORMAL HIGH (ref 11.4–15.2)

## 2024-09-18 LAB — RENAL FUNCTION PANEL
Albumin: 3.4 g/dL — ABNORMAL LOW (ref 3.5–5.0)
Anion gap: 13 (ref 5–15)
BUN: 36 mg/dL — ABNORMAL HIGH (ref 6–20)
CO2: 18 mmol/L — ABNORMAL LOW (ref 22–32)
Calcium: 8.6 mg/dL — ABNORMAL LOW (ref 8.9–10.3)
Chloride: 103 mmol/L (ref 98–111)
Creatinine, Ser: 1.14 mg/dL — ABNORMAL HIGH (ref 0.44–1.00)
GFR, Estimated: 55 mL/min — ABNORMAL LOW (ref >60)
Glucose, Bld: 170 mg/dL — ABNORMAL HIGH (ref 70–99)
Phosphorus: 4.3 mg/dL (ref 2.5–4.6)
Potassium: 5 mmol/L (ref 3.5–5.1)
Sodium: 134 mmol/L — ABNORMAL LOW (ref 135–145)

## 2024-09-18 LAB — AMMONIA: Ammonia: 83 umol/L — ABNORMAL HIGH (ref 9–35)

## 2024-09-18 LAB — GLUCOSE, CAPILLARY: Glucose-Capillary: 171 mg/dL — ABNORMAL HIGH (ref 70–99)

## 2024-09-18 LAB — CALCIUM, IONIZED
Calcium, Ionized, Serum: 5.1 mg/dL (ref 4.5–5.6)
Calcium, Ionized, Serum: 5 mg/dL (ref 4.5–5.6)

## 2024-09-18 LAB — MAGNESIUM: Magnesium: 2.5 mg/dL — ABNORMAL HIGH (ref 1.7–2.4)

## 2024-09-18 MED ORDER — SODIUM BICARBONATE 8.4 % IV SOLN
INTRAVENOUS | Status: AC
  Filled 2024-09-18: qty 50

## 2024-09-18 MED ORDER — EPINEPHRINE 1 MG/10ML IV SOSY
PREFILLED_SYRINGE | INTRAVENOUS | Status: AC
  Filled 2024-09-18: qty 10

## 2024-09-18 MED ORDER — EPINEPHRINE 1 MG/10ML IV SOSY
1.0000 mg | PREFILLED_SYRINGE | Freq: Once | INTRAVENOUS | Status: AC
  Administered 2024-09-18: 1 mg via INTRAVENOUS

## 2024-09-18 MED ORDER — GLYCOPYRROLATE 1 MG PO TABS: 1.0000 mg | ORAL_TABLET | ORAL | Status: DC | PRN

## 2024-09-18 MED ORDER — PHENYLEPHRINE 80 MCG/ML (10ML) SYRINGE FOR IV PUSH (FOR BLOOD PRESSURE SUPPORT)
80.0000 ug | PREFILLED_SYRINGE | Freq: Once | INTRAVENOUS | Status: AC
  Administered 2024-09-18: 80 ug via INTRAVENOUS

## 2024-09-18 MED ORDER — EPINEPHRINE HCL 5 MG/250ML IV SOLN IN NS: 0.5000 ug/min | INTRAVENOUS | Status: DC

## 2024-09-18 MED ORDER — PHENYLEPHRINE 80 MCG/ML (10ML) SYRINGE FOR IV PUSH (FOR BLOOD PRESSURE SUPPORT)
PREFILLED_SYRINGE | INTRAVENOUS | Status: AC
  Administered 2024-09-18: 160 ug
  Filled 2024-09-18: qty 10

## 2024-09-18 MED ORDER — PHENYLEPHRINE 80 MCG/ML (10ML) SYRINGE FOR IV PUSH (FOR BLOOD PRESSURE SUPPORT): 100.0000 ug | PREFILLED_SYRINGE | Freq: Once | INTRAVENOUS | Status: DC | PRN

## 2024-09-18 MED ORDER — PHENYLEPHRINE 200 MCG/ML (NON-ED) FOR PRIAPISM: 100.0000 ug | Freq: Once | INTRAMUSCULAR | Status: DC

## 2024-09-18 MED ORDER — SODIUM CHLORIDE 0.9 % IV SOLN: INTRAVENOUS | Status: DC

## 2024-09-18 MED ORDER — LACTATED RINGERS IV BOLUS
500.0000 mL | Freq: Once | INTRAVENOUS | Status: AC
  Administered 2024-09-18: 500 mL via INTRAVENOUS

## 2024-09-18 MED ORDER — PHENYLEPHRINE HCL-NACL 20-0.9 MG/250ML-% IV SOLN: 0.0000 ug/min | INTRAVENOUS | Status: DC

## 2024-09-18 MED ORDER — PHENYLEPHRINE HCL-NACL 20-0.9 MG/250ML-% IV SOLN
INTRAVENOUS | Status: AC
  Filled 2024-09-18: qty 250

## 2024-09-18 MED ORDER — GLYCOPYRROLATE 0.2 MG/ML IJ SOLN: 0.2000 mg | INTRAMUSCULAR | Status: DC | PRN

## 2024-09-18 MED ORDER — MORPHINE SULFATE (PF) 2 MG/ML IV SOLN: 2.0000 mg | INTRAVENOUS | Status: DC | PRN

## 2024-09-18 MED ORDER — SODIUM BICARBONATE 8.4 % IV SOLN: 50.0000 meq | Freq: Once | INTRAVENOUS | Status: DC

## 2024-09-19 LAB — CALCIUM, IONIZED: Calcium, Ionized, Serum: 4.8 mg/dL (ref 4.5–5.6)

## 2024-09-19 NOTE — Progress Notes (Signed)
 Patient in PEA. Rahual Desai at bedside. No heart and lung sounds auscultated by this RN, time of death called at 0519. Family at bedside.

## 2024-09-19 NOTE — Progress Notes (Signed)
  Progress Note   Date: 09/17/2024  Patient Name: Rachel Lucas        MRN#: [id number]  Clarification of diagnosis:  Severe malnutrition

## 2024-09-19 NOTE — IPAL (Signed)
  Interdisciplinary Goals of Care Family Meeting   Date carried out: 17-Oct-2024  Location of the meeting: Phone conference  Member's involved: Bedside Registered Nurse and Family Member or next of kin, Husband, PA-C  Durable Power of Pensions consultant or acting medical decision maker: Husband, Rachel Lucas.  Discussion: We discussed goals of care for Rachel Lucas. Nursing staff had already started discussions with family upon my arrival to the bedside. Husband had been in prior discussions with our team regarding the critical illness and concern for worsening clinical status of Rachel Lucas along with poor prognosis. Palliative Medicine had also been involved in pt's care over this admission. Multiple notes reviewed.  Unfortunately over the course of tonight, pt has had progressive shock and encephalopathy to the point she had become minimally responsive. Vasopressor requirements steadily increased throughout night from 10 Norepinephrine  to 40 Norepineprhine and vaso along with addition of Neosynephrine.  SBP via art line still in 70s. On exam, pt eyes open but not responsive and not following commands. Jaundiced throughout with mottling. CRRT ongoing.  Discussed options with husband who agreed to transitioning to comfort measures. He is roughly 15-20 minutes away from the hospital and has requested we try to get pt through the next hour so that he can come in to the hospital. I explained to him that we will increase pressors further in an attempt to do so; however, can not guarantee that she will respond. He understands and is appreciative of the efforts.  NE increased from 40 to 70, Neo increased from 100 to 400, continue Vaso.  RN and EMA MD updated on plan of care. Comfort orders to be placed now; however, RN aware to hold off until husband has hopefully arrived at bedside.   Code status:   Code Status: Do not attempt resuscitation (DNR) - Comfort care   Disposition: In-patient comfort  care  Time spent for the meeting: 10 minutes.    Rachel Trimarco, PA-C  10/17/24, 5:03 AM

## 2024-09-19 NOTE — Death Summary Note (Signed)
 DEATH SUMMARY   Patient Details  Name: Rachel Lucas MRN: [id number] DOB: 10-18-1961  Admission/Discharge Information   Admit Date:  2024/09/24  Date of Death: Date of Death: 10/09/2024  Time of Death: Time of Death: 0519  Length of Stay: 10/24/2024  Referring Physician: Auston Opal, DO   Reason(s) for Hospitalization  Sepsis  Diagnoses  Preliminary cause of death:  Secondary Diagnoses (including complications and co-morbidities):  Principal Problem:   Septic shock (HCC) Active Problems:   AKI (acute kidney injury)   Severe malnutrition   Encephalopathy, hepatic (HCC)   Protein-calorie malnutrition, severe   Decompensated cirrhosis (HCC)   Hemodialysis status   Palliative care by specialist   DIC (disseminated intravascular coagulation)   Gram-negative bacteremia   Macrocytic anemia   Acute hypoxic respiratory failure (HCC)   Acute renal failure   Acute systolic heart failure [facility])   Brief Hospital Course (including significant findings, care, treatment, and services provided and events leading to death)  Rachel Lucas is a 63 y.o. year old female with new diagnosis of alcoholic cirrhosis who presented on 09-24-24 with septic shock requiring pressors and was admitted directly to the ICU on broad spectrum antibiotics.  Her sepsis was initially though to be secondary to LLE cellulitis, but blood cultures grew GNRs which never speciated due to poor growth.  She experienced PEA arrest the day after admission, ROSC was achieved after 6 minutes, but she was intubated.  She was started on CRRT the following day.  She remained on pressors, ventilator, and CRRT and these interventions were gradually weaned off as she showed improvement.  Patient was able to be extubated and mental status improved significantly, though she remained somewhat encephalopathic.  Unfortunately, after a few days of improvement, mental status deteriorated, she again required pressors, hypoxemia returned, and she  again required CRRT.  As she acutely decompensated, the decision was made with spouse to transition to comfort care and patient passed away early 09-Oct-2024 AM.  Patient was initially started on Zosyn  (08/2020/10/06) and linezolid  (9/10-05-2015), then transitioned to CTX and metronidazole  (9/21-9/23), and finally completed course of meropenem  (9/23-9/30).  Below is a general timeline of significant hospital events:  24-Sep-2024 - Admitted, started on pressors and broad spectrum antibiotics. 9/16 - PEA arrest for 6 minutes with ROSC, intubated. 9/17 - Nephro consulted and CRRT started.  GNRs growing in BCx. 9/18 - Paracentesis negative for SBP. 9/19 - Cortrak placed and TF started. 9/23 - Off pressors, more alert and following instructions. 9/24 - Extubated. 9/25 - Discontinued CRRT. 9/28 - Significant azotemia, restarted CRRT. 9/29 - Started back on norepi and vasopressin  for hypotension, some respiratory distress. 10/09/24 - Acutely decompensated overnight, became hypotensive requiring 3 pressors, made comfort care, passed away.   Pertinent Labs and Studies  Significant Diagnostic Studies DG CHEST PORT 1 VIEW Result Date: 09/17/2024 CLINICAL DATA:  63 year old female with respiratory failure. EXAM: PORTABLE CHEST 1 VIEW COMPARISON:  Portable chest 09/15/2024 and earlier. CT Abdomen and Pelvis 09/08/2024. FINDINGS: Portable AP semi upright view at 0422 hours. Improved bibasilar ventilation from the CT on 09/08/2024. Residual streaky and now more confluent and platelike retrocardiac opacity on the left. Mildly improved lung volumes from 09/15/2024. No pneumothorax, edema, or effusion identified. Mediastinal contours remain normal. Visible enteric tube is stable, looping in the left upper quadrant. Visible right IJ probable dual lumen catheter is stable. Paucity of bowel gas.  No acute osseous abnormality identified. IMPRESSION: 1. Streaky and platelike retrocardiac opacity at the left lung  base, favor atelectasis.  Otherwise improved lung volumes and bibasilar ventilation since 09/08/2024. 2. Stable lines and tubes. Electronically Signed   By: VEAR Hurst M.D.   On: 09/17/2024 08:27   DG CHEST PORT 1 VIEW Result Date: 09/15/2024 CLINICAL DATA:  [id number] Acute hypoxemic respiratory failure (HCC) [id number] EXAM: PORTABLE CHEST 1 VIEW COMPARISON:  09/10/2024 FINDINGS: Central venous line and feeding tube unchanged. Interval removal of endotracheal to. Improved atelectasis at the LEFT lung base. No pulmonary edema. No pneumothorax. IMPRESSION: 1. Interval extubation.  Improved LEFT basilar atelectasis. 2. No pulmonary edema. Electronically Signed   By: Jackquline Boxer M.D.   On: 09/15/2024 09:16   DG Chest Port 1 View Result Date: 09/10/2024 EXAM: 1 VIEW XRAY OF THE CHEST 09/10/2024 05:44:06 AM COMPARISON: 09/08/2024 CLINICAL HISTORY: Respiratory failure (HCC) [id number]. ETT,SEPTIC SHOCK; ROVER FINDINGS: LINES, TUBES AND DEVICES: Endotracheal tube in place with tip 3.2 cm above carina. Enteric tube in place with tip reaching diaphragm and terminating below field of view. Right IJ venous catheter in place with tip at superior cavoatrial junction. LUNGS AND PLEURA: Significant decrease in diffuse bilateral interstitial opacities. Dense left retrocardiac opacity. Small left pleural effusion. HEART AND MEDIASTINUM: No acute abnormality of the cardiac and mediastinal silhouettes. BONES AND SOFT TISSUES: No acute osseous abnormality. IMPRESSION: 1. Significant decrease in diffuse bilateral interstitial opacities. 2. Persistant dense left retrocardiac opacity. 3. Small left pleural effusion. Electronically signed by: Waddell Calk MD 09/10/2024 06:28 AM EDT RP Workstation: [id number]   US  Abdomen Limited RUQ (LIVER/GB) Result Date: 09/08/2024 CLINICAL DATA:  Septic shock, cirrhosis, ascites EXAM: ULTRASOUND ABDOMEN LIMITED RIGHT UPPER QUADRANT COMPARISON:  CT today FINDINGS: Gallbladder: Layering sludge within the gallbladder. No  shadowing echogenic foci to suggest stones. No wall thickening or sonographic Murphy sign. Common bile duct: Diameter: Normal caliber, 5 mm Liver: Nodular contours compatible with cirrhosis. No focal abnormality. Portal vein is patent with reversal of flow. Other: Ascites noted in all 4 quadrants. Bilateral pleural effusions noted. IMPRESSION: Changes of cirrhosis with associated ascites. Sludge within the gallbladder. No evidence of cholelithiasis or acute cholecystitis. Bilateral pleural effusions. Electronically Signed   By: Franky Crease M.D.   On: 09/08/2024 19:43   CT HEAD WO CONTRAST ( ) Result Date: 09/08/2024 CLINICAL DATA:  Anoxic brain damage. EXAM: CT HEAD WITHOUT CONTRAST TECHNIQUE: Contiguous axial images were obtained from the base of the skull through the vertex without intravenous contrast. RADIATION DOSE REDUCTION: This exam was performed according to the departmental dose-optimization program which includes automated exposure control, adjustment of the mA and/or kV according to patient size and/or use of iterative reconstruction technique. COMPARISON:  None Available. FINDINGS: Brain: There is no evidence of an acute infarct, intracranial hemorrhage, mass, midline shift, or extra-axial fluid collection. There is mild cerebral atrophy. Cerebral white matter hypodensities are nonspecific but compatible with mild chronic small vessel ischemic disease. No cerebral edema is evident. Vascular: Calcified atherosclerosis at the skull base. No hyperdense vessel. Skull: No acute fracture or suspicious lesion. Sinuses/Orbits: Minimal mucosal thickening in the included paranasal sinuses. Temporal bone reported separately. Unremarkable orbits. Other: None. IMPRESSION: 1. No evidence of acute intracranial abnormality. 2. Mild chronic small vessel ischemic disease and cerebral atrophy. Electronically Signed   By: Dasie Hamburg M.D.   On: 09/08/2024 17:25   CT TEMPORAL BONES WO CONTRAST Result Date:  09/08/2024 CLINICAL DATA:  Mastoiditis. EXAM: CT TEMPORAL BONES WITHOUT CONTRAST TECHNIQUE: Axial and coronal plane CT imaging of the petrous temporal bones was performed with thin-collimation  image reconstruction. No intravenous contrast was administered. Multiplanar CT image reconstructions were also generated. RADIATION DOSE REDUCTION: This exam was performed according to the departmental dose-optimization program which includes automated exposure control, adjustment of the mA and/or kV according to patient size and/or use of iterative reconstruction technique. COMPARISON:  CT temporal bones 09/03/2024 FINDINGS: RIGHT TEMPORAL BONE External auditory canal: Normal. Middle ear cavity: Normally aerated. The scutum and ossicles are normal. The tegmen tympani is intact. Inner ear structures: The cochlea, vestibule and semicircular canals are normal. The vestibular aqueduct is not enlarged. Internal auditory and facial nerve canals:  Normal. Mastoid air cells: New mild scattered air cell opacification/small effusion without coalescence. LEFT TEMPORAL BONE External auditory canal: Persistent material throughout the [facility] with chart review documenting a tympanic membrane rupture and wick in place. No interval bone erosion. Middle ear cavity: There is a moderate amount of nonspecific material primarily throughout the mesotympanum with mild extension into the epitympanum, and middle ear cavity aeration has improved from the prior CT where it was completely opacified. Residual material surrounds the ossicles which appear grossly intact. The scutum and tegmen tympani also appear intact. Inner ear structures: The cochlea, vestibule and semicircular canals are normal. The vestibular aqueduct is not enlarged. Internal auditory and facial nerve canals:  Normal. Mastoid air cells: Moderate air cell opacification/effusion, similar to the prior CT and without coalescence. Vascular: Normal non-contrast appearance of the carotid  canals, jugular bulbs and sigmoid plates. Limited intracranial: More fully evaluated on the contemporaneous dedicated head CT. Visible orbits/paranasal sinuses: Minimal mucosal thickening in the paranasal sinuses. Unremarkable orbits. Soft tissues: Unremarkable. IMPRESSION: 1. Improved aeration of the left middle ear cavity and unchanged left mastoid effusion. No bone erosion. 2. New small right mastoid effusion. Electronically Signed   By: Dasie Hamburg M.D.   On: 09/08/2024 17:23   CT ABDOMEN PELVIS WO CONTRAST Result Date: 09/08/2024 CLINICAL DATA:  Sepsis jaucince, cirrosh. EXAM: CT ABDOMEN AND PELVIS WITHOUT CONTRAST TECHNIQUE: Multidetector CT imaging of the abdomen and pelvis was performed following the standard protocol without IV contrast. RADIATION DOSE REDUCTION: This exam was performed according to the departmental dose-optimization program which includes automated exposure control, adjustment of the mA and/or kV according to patient size and/or use of iterative reconstruction technique. COMPARISON:  CT scan abdomen and pelvis from 08/08/2024. FINDINGS: Lower chest: There is small left and small-to-moderate right pleural effusion with associated compressive atelectatic changes in the bilateral lower lobes. There are additional diffuse patchy ground-glass opacities in the middle lobe and bilateral lower lobes. Findings are nonspecific but new since the prior study. Differential diagnosis includes multilobar pneumonia, pulmonary edema, ARDS, atypical pneumonia, hypersensitivity pneumonitis, etc. Normal heart size. No pericardial effusion. There is apparent hypoattenuation of the blood pool relative to the myocardium, suggestive of anemia. Hepatobiliary: The liver is normal in size. There is diffuse liver surface irregularity/nodularity, compatible with cirrhosis. No focal lesion seen within the limitations of this unenhanced exam. No intrahepatic or extrahepatic bile duct dilation. Distended gallbladder  containing small volume dependent gallstones/sludge without imaging signs of acute cholecystitis. No abnormal wall thickening. Pancreas: Unremarkable. No pancreatic ductal dilatation or surrounding inflammatory changes. Spleen: Within normal limits. No focal lesion. Adrenals/Urinary Tract: Evaluation of bilateral adrenal glands is limited due to bilateral suprarenal venous collaterals, which are difficult to differentiate from the adrenal glands. No suspicious renal mass within the limitations of this unenhanced exam. No nephroureterolithiasis or obstructive uropathy. Urinary bladder is decompressed surrounding the Foley catheter. Stomach/Bowel: There are at least  2 enteric tubes. No disproportionate dilation of the small or large bowel loops. No evidence of abnormal bowel wall thickening or inflammatory changes. The appendix was not visualized; however there is no acute inflammatory process in the right lower quadrant. Vascular/Lymphatic: There is moderate ascites. No walled-off abscess. No pneumoperitoneum. There is also mild diffuse mesenteric edema. No abdominal or pelvic lymphadenopathy, by size criteria. No aneurysmal dilation of the major abdominal arteries. There are mild peripheral atherosclerotic vascular calcifications of the aorta and its major branches. Reproductive: The uterus is unremarkable. No large adnexal mass. Other: There is small left femoral hernia containing fat. There is moderate anasarca. Right common iliac arterial and venous catheters noted inserted from right inguinal approach. Musculoskeletal: No suspicious osseous lesions. There are mild multilevel degenerative changes in the visualized spine. IMPRESSION: 1. No acute inflammatory process identified within the abdomen or pelvis. 2. There is cirrhotic liver configuration. There is moderate ascites. No walled-off abscess or pneumoperitoneum. 3. There is small left and small-to-moderate right pleural effusion with associated compressive  atelectatic changes in the bilateral lower lobes. There are additional diffuse patchy ground-glass opacities in the middle lobe and bilateral lower lobes. Findings are nonspecific but new since the prior study. Differential diagnosis includes multilobar pneumonia, pulmonary edema, ARDS, atypical pneumonia, hypersensitivity pneumonitis, etc. 4. Multiple other nonacute observations, as described above. Aortic Atherosclerosis (ICD10-I70.0). Electronically Signed   By: Ree Molt M.D.   On: 09/08/2024 16:27   DG CHEST PORT 1 VIEW Result Date: 09/08/2024 CLINICAL DATA:  [id number].  Adult respiratory distress syndrome. EXAM: PORTABLE CHEST 1 VIEW COMPARISON:  Portable chest yesterday at 4:31 a.m. FINDINGS: 4:48 a.m. NGT again noted entering the stomach with interval new feeding tube paralleling it. The radiopaque feeding tube and NGT tip and both are out of view. ETT terminates 4.2 cm from the carina. Right IJ double-lumen catheter with tip in the distal SVC. There is a tangle of overlying wires. The heart is slightly enlarged. There is still mild perihilar vascular congestion. Interstitial and patchy airspace infiltrates throughout the lungs continue to be seen with more dense left lower lobe consolidation and small pleural effusions. Overall aeration seems unchanged. The mediastinum stable. No new osseous finding. IMPRESSION: 1. No significant change in the appearance of the chest. 2. Support apparatus as above. 3. Stable cardiomegaly and mild perihilar vascular congestion. 4. Stable interstitial and patchy airspace infiltrates throughout the lungs with more dense left lower lobe consolidation and small pleural effusions. Electronically Signed   By: Francis Quam M.D.   On: 09/08/2024 07:35   DG Abd Portable 1V Result Date: 09/07/2024 CLINICAL DATA:  Feeding tube placement EXAM: DG ABD PORTABLE 1V COMPARISON:  None Available. FINDINGS: Enteric tube tip and side hole terminates in the stomach. Additional enteric  tube possibly terminating at the level of ligament of Treitz. Right femoral catheter tip at the level of the common iliac. Paucity of bowel gas. No radio-opaque calculi or other significant radiographic abnormality are seen. IMPRESSION: Enteric tubes in place. Paucity of bowel gas. Electronically Signed   By: Megan  Zare M.D.   On: 09/07/2024 14:25   DG Abd 1 View Result Date: 09/07/2024 EXAM: 1 VIEW XRAY OF THE ABDOMEN 09/07/2024 11:24:00 AM COMPARISON: 09/05/2024 CLINICAL HISTORY: Ileus FINDINGS: LINES, TUBES AND DEVICES: Enteric tube terminates in the stomach. Right femoral vascular catheter unchanged, tip in the region of the common iliac vessels . BOWEL: Generalized positive bowel gas. No gaseous bowel distention. No significant formed colon colonic stool. SOFT TISSUES:  No opaque urinary calculi. BONES: No acute osseous abnormality. IMPRESSION: 1. Paucity of bowel gas. No gaseous bowel distention. Electronically signed by: Andrea Gasman MD 09/07/2024 01:12 PM EDT RP Workstation: [id number]   DG CHEST PORT 1 VIEW Result Date: 09/07/2024 EXAM: 1 VIEW XRAY OF THE CHEST 09/07/2024 05:04:00 AM COMPARISON: 09/06/2024 CLINICAL HISTORY: Encounter for ARDS (adult respiratory distress syndrome) FINDINGS: LINES, TUBES AND DEVICES: Endotracheal tube in place with tip 6 cm above the carina. Enteric tube in place, courses below the diaphragm, beyond the field of view. Right IJ CVC in place with tip overlying the lower SVC. LUNGS AND PLEURA: Improved aeration of lung bases. Persistent diffuse airspace opacities. Small left pleural effusion. HEART AND MEDIASTINUM: Unchanged heart size and mediastinal contours. BONES AND SOFT TISSUES: No acute osseous abnormality. IMPRESSION: 1. Improved aeration of lung bases. Persistent diffuse airspace opacities. 2. Small left pleural effusion. Electronically signed by: Andrea Gasman MD 09/07/2024 01:10 PM EDT RP Workstation: [id number]   IR Paracentesis Result Date:  09/06/2024 INDICATION: 63 year old female with history of cirrhosis, new abdominal distention with sepsis. Diagnostic and therapeutic paracentesis to rule out spontaneous bacterial peritonitis. EXAM: ULTRASOUND GUIDED DIAGNOSTIC AND THERAPEUTIC PARACENTESIS MEDICATIONS: 4 mL 1% lidocaine  COMPLICATIONS: None immediate. PROCEDURE: Informed written consent was obtained from the patient after a discussion of the risks, benefits and alternatives to treatment. A timeout was performed prior to the initiation of the procedure. Initial ultrasound scanning demonstrates a small amount of ascites within the right lower abdominal quadrant. The right lower abdomen was prepped and draped in the usual sterile fashion. 1% lidocaine  was used for local anesthesia. Following this, a 19 gauge, 7-cm, Cook centesis catheter was introduced. An ultrasound image was saved for documentation purposes. The paracentesis was performed. The catheter was removed and a dressing was applied. The patient tolerated the procedure well without immediate post procedural complication. FINDINGS: A total of approximately 1.3 liters of light amber/orange fluid was removed. Samples were sent to the laboratory as requested by the clinical team. IMPRESSION: Successful ultrasound-guided paracentesis yielding 1.3 liters of peritoneal fluid. Performed by: Kacie Matthews PA-C Electronically Signed   By: Cordella Banner   On: 09/06/2024 15:15   DG CHEST PORT 1 VIEW Result Date: 09/06/2024 CLINICAL DATA:  Endotracheally intubated, ARDS EXAM: PORTABLE CHEST 1 VIEW COMPARISON:  09/05/2024 FINDINGS: Endotracheal tube is 5 cm above the carina. NG tube is in the stomach. Right dialysis catheter is unchanged. Heart and mediastinal contours are within normal limits. Airspace disease improved since prior study. Probable small layering effusions. IMPRESSION: Diffuse bilateral airspace disease improving since prior study. Small layering effusions. Electronically Signed    By: Franky Crease M.D.   On: 09/06/2024 13:41   DG CHEST PORT 1 VIEW Result Date: 09/05/2024 CLINICAL DATA:  [id number] Encounter for central line placement [id number] EXAM: PORTABLE CHEST - 1 VIEW COMPARISON:  09/05/2024 7:28 a.m. FINDINGS: Similarly positioned endotracheal tube terminating in the mid trachea. Right IJ approach central venous catheter has been placed in the interim, terminating in the lower SVC. Overlying defibrillator pads again noted. Esophagogastric tube courses below the diaphragm with the distal tip not included in the field of view. Unchanged diffuse hazy airspace disease throughout both lungs. Likely layering bilateral pleural effusions are also present. Mild cardiomegaly. No pneumothorax. IMPRESSION: Interval placement of a right IJ approach central venous catheter, which terminates in the lower SVC. No pneumothorax. Otherwise, little significant interval change to the lungs. Electronically Signed   By: Rogelia Carlean HERO.D.  On: 09/05/2024 16:32   DG CHEST PORT 1 VIEW Result Date: 09/05/2024 CLINICAL DATA:  63 year old female with respiratory failure. Intubated. EXAM: PORTABLE CHEST 1 VIEW COMPARISON:  Portable chest yesterday and earlier. FINDINGS: Portable AP semi upright view at 0728 hours. Endotracheal tube tip at the level the clavicles. Enteric tube courses to the abdomen, tip not included. Pacer/resuscitation pads remain in place. Stable lung volumes and visible mediastinal contours with ongoing diffuse pulmonary opacity, veiling and confluent bibasilar opacity which obscures the diaphragm. Vague but confluent symmetric lung opacity elsewhere. Marked progression compared to portable chest on 09/03/2024, ventilation not significantly changed from yesterday. No pneumothorax. Paucity of bowel gas. Stable visualized osseous structures. IMPRESSION: 1. Stable lines and tubes. 2. Extensive bilateral Pneumonia versus Edema versus ARDS without significant change from yesterday. Probable  bilateral pleural effusions. Electronically Signed   By: VEAR Hurst M.D.   On: 09/05/2024 07:38   DG Abd Portable 1V Result Date: 09/05/2024 CLINICAL DATA:  Check gastric catheter placement EXAM: PORTABLE ABDOMEN - 1 VIEW COMPARISON:  Film from the previous day. FINDINGS: Gastric catheter is been advanced deeper into the stomach. Free air seen. IMPRESSION: Gastric catheter within the stomach. Electronically Signed   By: Oneil Devonshire M.D.   On: 09/05/2024 01:28   DG Abd 1 View Result Date: 09/04/2024 CLINICAL DATA:  Check gastric catheter placement EXAM: ABDOMEN - 1 VIEW COMPARISON:  None Available. FINDINGS: Gastric catheter is noted within the stomach. The proximal side port is felt to lie within the distal esophagus. This should be advanced several cm deeper and stomach. IMPRESSION: Gastric catheter as described. This should be advanced deeper into the stomach. Electronically Signed   By: Oneil Devonshire M.D.   On: 09/04/2024 22:16   DG CHEST PORT 1 VIEW Result Date: 09/04/2024 EXAM: 1 VIEW XRAY OF THE CHEST 09/04/2024 05:35:18 PM COMPARISON: 09/04/2024 CLINICAL HISTORY: Endotracheally intubated; MD approved xrays at bedside. FINDINGS: LINES, TUBES AND DEVICES: Endotracheal tube in place with tip 4.3 cm above the carina. Enteric tube with tip and sidehole overlying the lower esophagus, recommend advancement. External cardiac pacing pads on lower chest. LUNGS AND PLEURA: Hazy airspace opacities throughout the lungs are increased, likely representing multifocal pneumonia versus pulmonary edema. Bilateral pleural effusions. HEART AND MEDIASTINUM: No acute abnormality of the cardiac and mediastinal silhouettes. BONES AND SOFT TISSUES: No acute osseous abnormality. IMPRESSION: 1. Hazy airspace opacities throughout the lungs, likely representing multifocal pneumonia versus pulmonary edema. 2. Bilateral pleural effusions. 3. Enteric tube with tip and sidehole overlying the lower esophagus, recommend advancement.  Electronically signed by: Donnice Mania MD 09/04/2024 07:31 PM EDT RP Workstation: [id number]   DG Abd 1 View Result Date: 09/04/2024 EXAM: 1 VIEW XRAY OF THE ABDOMEN 09/04/2024 05:35:18 PM COMPARISON: 09/04/2024 CLINICAL HISTORY: Endotracheally intubated; MD approved xrays at bedside. FINDINGS: LINES, TUBES AND DEVICES: Enteric tube in distal esophagus. Right femoral vascular catheter in place. BOWEL: Paucity of gas throughout the abdomen. Gaseous distension of the stomach similar to prior study. SOFT TISSUES: No opaque urinary calculi. BONES: No acute osseous abnormality. IMPRESSION: 1. Gaseous distension of the stomach, similar to prior study. 2. Enteric tube with tip and side hole overlying the lower esophagus. Electronically signed by: Donnice Mania MD 09/04/2024 07:28 PM EDT RP Workstation: [id number]   EEG adult Result Date: 09/04/2024 Shelton Arlin KIDD, MD     09/04/2024  5:32 PM Patient Name: KINLEIGH NAULT MRN: [id number] Epilepsy Attending: Arlin KIDD Shelton Referring Physician/Provider: Toma Matas, MD Date: 09/04/2024 Duration: 22.19  mins Patient history: 63yo F with ams getting eeg to evaluate for seizure Level of alertness: Awake AEDs during EEG study: None Technical aspects: This EEG study was done with scalp electrodes positioned according to the 10-20 International system of electrode placement. Electrical activity was reviewed with band pass filter of 1-70Hz , sensitivity of 7 uV/mm, display speed of 19mm/sec with a 60Hz  notched filter applied as appropriate. EEG data were recorded continuously and digitally stored.  Video monitoring was available and reviewed as appropriate. Description: EEG showed continuous generalized 3 to 6 Hz theta-delta slowing. Hyperventilation and photic stimulation were not performed.   ABNORMALITY - Continuous slow, generalized IMPRESSION: This study is suggestive of moderate diffuse encephalopathy. No seizures or epileptiform discharges were seen throughout  the recording. Priyanka O Yadav   VAS US  LOWER EXTREMITY VENOUS (DVT) Result Date: 09/04/2024  Lower Venous DVT Study Patient Name:  ALA KRATZ  Date of Exam:   09/04/2024 Medical Rec #: [id number]           Accession #:    [id number] Date of Birth: 05-08-1961           Patient Gender: F Patient Age:   64 years Exam Location:  [facility] Procedure:      VAS US  LOWER EXTREMITY VENOUS (DVT) Referring Phys: TORIBIO SHARPS --------------------------------------------------------------------------------  Indications: Edema.  Limitations: Poor ultrasound/tissue interface and line. Comparison Study: No previous exams Performing Technologist: Jody Hill RVT, RDMS  Examination Guidelines: A complete evaluation includes B-mode imaging, spectral Doppler, color Doppler, and power Doppler as needed of all accessible portions of each vessel. Bilateral testing is considered an integral part of a complete examination. Limited examinations for reoccurring indications may be performed as noted. The reflux portion of the exam is performed with the patient in reverse Trendelenburg.  +-----+---------------+---------+-----------+----------+--------------+ RIGHTCompressibilityPhasicitySpontaneityPropertiesThrombus Aging +-----+---------------+---------+-----------+----------+--------------+ CFV                                               not visualized +-----+---------------+---------+-----------+----------+--------------+ CFV/SFJ not visualized due to central and arterial line placement.  +---------+---------------+---------+-----------+----------+--------------+ LEFT     CompressibilityPhasicitySpontaneityPropertiesThrombus Aging +---------+---------------+---------+-----------+----------+--------------+ CFV      Full           No       Yes                                 +---------+---------------+---------+-----------+----------+--------------+ SFJ      Full                                                         +---------+---------------+---------+-----------+----------+--------------+ FV Prox  Full           No       Yes                                 +---------+---------------+---------+-----------+----------+--------------+ FV Mid   Full           No       Yes                                 +---------+---------------+---------+-----------+----------+--------------+  FV DistalFull           No       Yes                                 +---------+---------------+---------+-----------+----------+--------------+ PFV      Full                                                        +---------+---------------+---------+-----------+----------+--------------+ POP      Full           No       Yes                                 +---------+---------------+---------+-----------+----------+--------------+ PTV      Full                                                        +---------+---------------+---------+-----------+----------+--------------+ PERO     Full                                                        +---------+---------------+---------+-----------+----------+--------------+ pulsatile doppler waveforms    Summary: LEFT: - There is no evidence of deep vein thrombosis in the lower extremity.  - No cystic structure found in the popliteal fossa. Diffuse subcutaneous edema throughout lower extremity.  *See table(s) above for measurements and observations. Electronically signed by Penne Colorado MD on 09/04/2024 at 4:30:43 PM.    Final    DG Abd Portable 1V Result Date: 09/04/2024 EXAM: 1 VIEW XRAY OF THE ABDOMEN 09/04/2024 02:26:00 PM COMPARISON: CT abdomen and pelvis dated 08/08/2024. CLINICAL HISTORY: Encounter for feeding tube placement. Post intubation and feeding tube. FINDINGS: LINES, TUBES AND DEVICES: Enteric tube tip projects over the stomach. Side hole projects over the lower esophagus. Right inferior femoral approach venous catheter  tip projects over the right L5 level. BOWEL: Gas filled mildly dilated stomach and a few loops of right-sided bowel loops are present. SOFT TISSUES: No opaque urinary calculi. BONES: No acute osseous abnormality. IMPRESSION: 1. Enteric tube tip projects over the stomach, with side hole projecting over the lower esophagus. Recommend advancing. Electronically signed by: Manford Cummins MD 09/04/2024 03:12 PM EDT RP Workstation: [id number]   DG CHEST PORT 1 VIEW Result Date: 09/04/2024 CLINICAL DATA:  Status post intubation EXAM: PORTABLE CHEST 1 VIEW COMPARISON:  September 04, 2024 abdominal radiograph and September 03, 2024 chest radiograph FINDINGS: Interval placement of endotracheal tube with tip at the level of the trachea. Recommend retraction 2-3 cm. Interval placement of enteric tube with tip outside the field of view with side port near the distal esophagus, better seen on same day abdominal radiograph. Recommend advancement. Partially imaged linear radiodensity projecting over the left hemithorax and mediastinum, possibly representing overlying lead or support device external to the patient. Low lung volumes with multifocal bilateral airspace opacities,  greatest at the right lung base, possibly related to a pulmonary edema and/or infection. Unchanged cardiomediastinal silhouette. No acute osseous findings. IMPRESSION: 1. Endotracheal tube with tip at the level of the carina. Recommend retraction 2-3 cm. 2. Enteric tube with tip better seen on prior same day abdominal radiograph but side port in the distal esophagus. Recommend advancement. 3. Additional overlying radiodensities, favored to represent external leads or support devices. 4. Multifocal pulmonary opacities, greatest at the right lung base, possibly representing multifocal pneumonia with possible underlying pulmonary edema. These results will be called to the ordering clinician or representative by the Radiologist Assistant, and communication documented  in the PACS or [employer]. Electronically Signed   By: Michaeline Blanch M.D.   On: 09/04/2024 14:52   US  Abdomen Limited RUQ (LIVER/GB) Result Date: 09/04/2024 EXAM: Right Upper Quadrant Abdominal Ultrasound TECHNIQUE: Real-time ultrasonography of the right upper quadrant of the abdomen was performed. COMPARISON: CT abdomen and pelvis dated 08/08/2024. CLINICAL HISTORY: Cirrhosis (HCC) FINDINGS: LIVER: The liver demonstrates cirrhotic morphology with nodular hepatic contour. Portal vein is patent. Reversal of portal venous flow. BILIARY SYSTEM: No evidence of wall thickening. Moderate volume layering sludge. No cholelithiasis. Common bile duct measures 5 mm. OTHER: Moderate volume ascites. IMPRESSION: 1. Cirrhotic morphology with sequela of portal hypertension including moderate volume ascites and reversal of portal venous flow. 2. Gallbladder contains moderate volume layering sludge. Electronically signed by: Manford Cummins MD 09/04/2024 02:49 PM EDT RP Workstation: [id number]   ECHOCARDIOGRAM COMPLETE Result Date: 09/04/2024    ECHOCARDIOGRAM REPORT   Patient Name:   ARLEN LEGENDRE Date of Exam: 09/04/2024 Medical Rec #:  [id number]          Height:       63.0 in Accession #:    [id number]         Weight:       140.4 lb Date of Birth:  1961-09-11          BSA:          1.664 m Patient Age:    62 years           BP:           139/85 mmHg Patient Gender: F                  HR:           59 bpm. Exam Location:  Inpatient Procedure: 2D Echo, Color Doppler and Cardiac Doppler (Both Spectral and Color            Flow Doppler were utilized during procedure). Indications:    Cardiac Arrest i46.9  History:        Patient has no prior history of Echocardiogram examinations.  Sonographer:    Damien Senior RDCS Referring Phys: [phone] MURALI RAMASWAMY  Sonographer Comments: Zoll pads still in place post arrest, no parasternals IMPRESSIONS  1. Left ventricular ejection fraction, by estimation, is 35 to 40%. The left ventricle  has moderately decreased function. Paradoxical/abnormal septal motion after cardiac arrest possibly secondary to junctional escape. Left ventricular diastolic parameters are indeterminate.  2. Right ventricular systolic function is low normal. The right ventricular size is normal. There is normal pulmonary artery systolic pressure. The estimated right ventricular systolic pressure is 33.5 mmHg.  3. The mitral valve is grossly normal. Severe mitral valve regurgitation.  4. Tricuspid valve regurgitation is moderate to severe.  5. The aortic valve is grossly normal. Aortic valve regurgitation is not visualized.  6. The  inferior vena cava is dilated in size with <50% respiratory variability, suggesting right atrial pressure of 15 mmHg. FINDINGS  Left Ventricle: Left ventricular ejection fraction, by estimation, is 35 to 40%. The left ventricle has moderately decreased function. The left ventricle demonstrates regional wall motion abnormalities. The left ventricular internal cavity size was normal in size. Suboptimal image quality limits for assessment of left ventricular hypertrophy. Left ventricular diastolic parameters are indeterminate. Right Ventricle: The right ventricular size is normal. No increase in right ventricular wall thickness. Right ventricular systolic function is low normal. There is normal pulmonary artery systolic pressure. The tricuspid regurgitant velocity is 2.15 m/s,  and with an assumed right atrial pressure of 15 mmHg, the estimated right ventricular systolic pressure is 33.5 mmHg. Left Atrium: Left atrial size was normal in size. Right Atrium: Right atrial size was normal in size. Pericardium: There is no evidence of pericardial effusion. Mitral Valve: The mitral valve is grossly normal. Severe mitral valve regurgitation. Tricuspid Valve: The tricuspid valve is grossly normal. Tricuspid valve regurgitation is moderate to severe. Aortic Valve: The aortic valve is grossly normal. Aortic valve  regurgitation is not visualized. Pulmonic Valve: The pulmonic valve was not assessed. Pulmonic valve regurgitation is not visualized. Aorta: The ascending aorta was not well visualized. Venous: The inferior vena cava is dilated in size with less than 50% respiratory variability, suggesting right atrial pressure of 15 mmHg. IAS/Shunts: No atrial level shunt detected by color flow Doppler. Additional Comments: Severe ascites is present.  LEFT VENTRICLE PLAX 2D LVOT diam:     2.10 cm LV SV:         29 LV SV Index:   18 LVOT Area:     3.46 cm  LV Volumes (MOD) LV vol d, MOD A4C: 85.2 ml LV vol s, MOD A4C: 50.6 ml LV SV MOD A4C:     85.2 ml RIGHT VENTRICLE RV S prime:     11.60 cm/s TAPSE (M-mode): 2.0 cm LEFT ATRIUM             Index        RIGHT ATRIUM           Index LA Vol (A2C):   47.3 ml 28.43 ml/m  RA Area:     17.10 cm LA Vol (A4C):   42.0 ml 25.24 ml/m  RA Volume:   44.40 ml  26.69 ml/m LA Biplane Vol: 48.0 ml 28.85 ml/m  AORTIC VALVE LVOT Vmax:   57.90 cm/s LVOT Vmean:  43.350 cm/s LVOT VTI:    0.085 m TRICUSPID VALVE TR Peak grad:   18.5 mmHg TR Vmax:        215.00 cm/s  SHUNTS Systemic VTI:  0.08 m Systemic Diam: 2.10 cm Aditya Sabharwal Electronically signed by Ria Commander Signature Date/Time: 09/04/2024/2:27:44 PM    Final    DG Chest 1 View Result Date: 09/03/2024 CLINICAL DATA:  Septic shock EXAM: PORTABLE CHEST 1 VIEW COMPARISON:  Film from earlier in the same day. FINDINGS: Cardiac shadow is stable. The lungs are well aerated bilaterally. Mild basilar atelectasis is seen. No focal confluent infiltrate is noted. Small right effusion is again seen. No bony abnormality is noted. IMPRESSION: Mild basilar atelectasis and right effusion stable from the prior study. Electronically Signed   By: Oneil Devonshire M.D.   On: 09/03/2024 21:52   CT TEMPORAL BONES WO CONTRAST Result Date: 09/03/2024 EXAM: CT TEMPORAL BONES WITHOUT CONTRAST 09/03/2024 06:32:41 PM TECHNIQUE: CT of the temporal bones was  performed  without the administration of intravenous contrast. Multiplanar reformatted images are provided for review. Automated exposure control, iterative reconstruction, and/or weight based adjustment of the mA/kV was utilized to reduce the radiation dose to as low as reasonably achievable. COMPARISON: None available. CLINICAL HISTORY: Hearing loss, mixed. FINDINGS: RIGHT TEMPORAL BONE: EXTERNAL AUDITORY CANAL: Clear. No bony erosion. Scutum is intact. MIDDLE EAR CAVITY: Clear. Ossicular chain is intact. MASTOID AIR CELLS: Clear. INNER EAR: The cochlea and vestibule are unremarkable. Normal mineralization of the otic capsule. Normal semicircular canals. The vestibular aqueduct is not dilated. INTERNAL AUDITORY CANAL: Unremarkable. Normal bony canal of the facial nerve. LEFT TEMPORAL BONE: EXTERNAL AUDITORY CANAL: There is lobulated debris within the left external auditory canal possibly related to history of tympanic membrane perforation. The left tympanic membrane is intact. There is no definite soft tissue swelling or abnormal enhancement along the left external auditory canal. MIDDLE EAR CAVITY: There is complete opacification of the left middle ear. Abnormal soft tissue attenuation surrounds the left ossicles without evidence of erosive change. The left ossicles appear normal in morphology and position. The left tympanic membrane is obscured due to adjacent soft tissue. There is thickening and heterogeneity of the left tympanic membrane likely reflecting chronic changes. MASTOID AIR CELLS: Moderate left mastoid effusion. INNER EAR: The cochlea and vestibule are unremarkable. Normal mineralization of the otic capsule. Normal semicircular canals. The vestibular aqueduct is not dilated. INTERNAL AUDITORY CANAL: Unremarkable. Normal bony canal of the facial nerve. VASCULAR: Normal jugular bulbs. Normal carotid canals. BRAIN: Unremarkable. ORBITS: No acute abnormality. SINUSES: Clear. IMPRESSION: 1. Complete  opacification of the left middle ear concerning for otitis media. Underlying small mass cannot be excluded. No erosive change of the ossicles. 2. Moderate left mastoid effusion. 3. Lobulated debris within the left external auditory canal, possibly related to history of tympanic membrane perforation. 4. Thickening and heterogeneity of the left tympanic membrane, likely reflecting chronic inflammatory changes. 5. Unremarkable CT of the right temporal bone. Electronically signed by: Donnice Mania MD 09/03/2024 07:17 PM EDT RP Workstation: [id number]   CT TIBIA FIBULA LEFT WO CONTRAST Result Date: 09/03/2024 CLINICAL DATA:  Soft tissue swelling in the lower extremity, evaluate for underlying infection EXAM: CT OF THE LOWER LEFT EXTREMITY WITHOUT CONTRAST TECHNIQUE: Multidetector CT imaging of the lower left extremity was performed according to the standard protocol. RADIATION DOSE REDUCTION: This exam was performed according to the departmental dose-optimization program which includes automated exposure control, adjustment of the mA and/or kV according to patient size and/or use of iterative reconstruction technique. COMPARISON:  None Available. FINDINGS: Bones/Joint/Cartilage Distal femur is within normal limits. Tibia and fibula are well visualized without acute fracture or erosive changes. No joint effusion is noted in the knee. Ligaments Suboptimally assessed by CT. Muscles and Tendons Surrounding musculature reveals fatty infiltration of the medial head of the gastrocnemius. The Achilles tendon is intact. The remainder of the musculature appears within normal limits. Visualized tendons appear intact. Soft tissues Considerable soft tissue edema is noted throughout the lower extremity and extending into the distal thigh. No focal findings to suggest abscess are identified. IMPRESSION: No acute fracture or erosive changes are noted. Considerable subcutaneous edema without definitive abscess. Incidental note is made  of fatty infiltration of the medial head of the gastrocnemius. Electronically Signed   By: Oneil Devonshire M.D.   On: 09/03/2024 19:10   US  RENAL Result Date: 09/03/2024 CLINICAL DATA:  Acute kidney injury EXAM: RENAL / URINARY TRACT ULTRASOUND COMPLETE COMPARISON:  CT abdomen pelvis August 08, 2024 FINDINGS: Right Kidney: Renal measurements: 10.5 x 4.7 x 5.4 cm = volume: 137 mL. Echogenicity is increased. No mass or hydronephrosis visualized. Left Kidney: Renal measurements: 11.2 x 4.9 x 4.5 cm = volume: 132 cm mL. Echogenicity is increased. No mass or hydronephrosis visualized. Bladder: Not well seen due to moderate ascites and under distension. Other: Moderate ascites is identified throughout the abdominopelvic cavity. Cirrhotic liver morphology. IMPRESSION: Bilateral increased renal echogenicity which can be seen the setting of parenchymal renal disease. (hepatocellular disease) Partially visualized cirrhotic liver. Moderate ascites. Electronically Signed   By: Megan  Zare M.D.   On: 09/03/2024 18:53   DG Chest Port 1 View Result Date: 09/03/2024 CLINICAL DATA:  [id number] Sepsis (HCC) [id number] EXAM: PORTABLE CHEST - 1 VIEW COMPARISON:  08/13/2024 FINDINGS: Low lung volumes. Patchy airspace opacities in both lung bases. No pneumothorax. Unchanged trace right pleural effusion. Biapical pleural scarring. No cardiomegaly. Tortuous aorta with aortic atherosclerosis. No acute fracture or destructive lesions. Multilevel thoracic osteophytosis. IMPRESSION: 1. Low lung volumes. Patchy airspace opacities in both lung bases, which may represent atelectasis or a developing bronchopneumonia, in the correct clinical context. 2. Unchanged trace right pleural effusion. Electronically Signed   By: Rogelia Myers M.D.   On: 09/03/2024 17:18    Microbiology No results found for this or any previous visit (from the past 240 hours).  Lab Basic Metabolic Panel: Recent Labs  Lab 09/12/24 0808 09/12/24 1523 09/13/24 0418  09/13/24 1524 09/16/24 0408 09/16/24 1720 09/17/24 0413 09/17/24 0534 09/17/24 9077 09/17/24 1609 09/17/24 1616 09/17/24 2110 2024/10/11 0356 11-Oct-2024 0424  NA 138   < > 137   < > 142 142 137   < > 136 135 138 137 137 134*  K 4.2   < > 3.7   < > 4.3 3.9 4.4   < > 4.5 4.6 4.7 5.2* 5.1 5.0  CL 106   < > 104   < > 101 103 104  --  102 100  --   --   --  103  CO2 23   < > 21*   < > 16* 19* 20*  --  21* 21*  --   --   --  18*  GLUCOSE 135*   < > 165*   < > 147* 116* 124*  --  135* 173*  --   --   --  170*  BUN 24*   < > 27*   < > 143* 130* 70*  --  57* 49*  --   --   --  36*  CREATININE 1.17*   < > 1.14*   < > 4.36* 3.93* 2.22*  --  1.77* 1.45*  --   --   --  1.14*  CALCIUM  9.0   < > 9.6   < > 10.8* 9.5 9.7  --  9.8 9.4  --   --   --  8.6*  MG 2.5*  --  2.5*  --  3.2*  --  2.8*  --   --   --   --   --   --  2.5*  PHOS  --    < > 2.6   < > 8.5* 6.6* 4.6  --  4.3 4.1  --   --   --  4.3   < > = values in this interval not displayed.   Liver Function Tests: Recent Labs  Lab 09/12/24 9666 09/12/24 1523 09/13/24 0418 09/13/24 1524 09/14/24 0516 09/14/24 1627  09/16/24 1720 09/17/24 0413 09/17/24 0922 09/17/24 1609 2024/10/15 0424  AST 77*  --  89*  --  79*  --   --  122*  --   --   --   ALT 50*  --  60*  --  50*  --   --  48*  --   --   --   ALKPHOS 157*  --  171*  --  203*  --   --  266*  --   --   --   BILITOT 13.0*  --  14.9*  --  11.8*  --   --  9.3*  --   --   --   PROT 7.0  --  8.1  --  7.3  --   --  8.3*  --   --   --   ALBUMIN  4.5  4.4   < > 4.9  4.9   < > 4.3  4.4   < > 4.6 4.6  4.7 4.7 4.4 3.4*   < > = values in this interval not displayed.   No results for input(s): LIPASE, AMYLASE in the last 168 hours. Recent Labs  Lab 09/14/24 1100 09/17/24 0512 Oct 15, 2024 0423  AMMONIA 49* 70* 83*   CBC: Recent Labs  Lab 09/13/24 0418 09/14/24 0516 09/15/24 0839 09/15/24 0852 09/16/24 0408 09/17/24 0413 09/17/24 0534 09/17/24 0850 09/17/24 1616 09/17/24 2110  Oct 15, 2024 0356 October 15, 2024 0424  WBC 30.8* 35.8* 28.7*  --  29.5* 24.8*  --   --   --   --   --  19.9*  NEUTROABS 26.1*  --   --   --   --   --   --   --   --   --   --   --   HGB 10.0* 9.5* 9.1*   < > 9.0* 9.6*   < > 9.9* 10.5* 11.6* 10.2* 7.6*  HCT 28.4* 27.1* 25.8*   < > 25.9* 27.2*   < > 29.0* 31.0* 34.0* 30.0* 22.4*  MCV 100.4* 101.5* 101.6*  --  101.6* 100.7*  --   --   --   --   --  105.2*  PLT 31* 39* 58*  --  106* 133*  --   --   --   --   --  118*   < > = values in this interval not displayed.   Cardiac Enzymes: No results for input(s): CKTOTAL, CKMB, CKMBINDEX, TROPONINI in the last 168 hours. Sepsis Labs: Recent Labs  Lab 09/15/24 0839 09/15/24 0840 09/15/24 0847 09/16/24 0408 09/17/24 0413 09/17/24 0512 October 15, 2024 0424  WBC 28.7*  --   --  29.5* 24.8*  --  19.9*  LATICACIDVEN  --  1.8 2.0*  --   --  2.2*  --     Procedures/Operations   CVC placed 9/15 PEA arrest, code, intubation 9/16 Diagnostic paracentesis 9/18 CRRT started 9/17 Extubated 9/24   Nereida Schepp 2024/10/15, 10:37 AM

## 2024-09-19 NOTE — Progress Notes (Signed)
  Progress Note   Date: 09/17/2024  Patient Name: Rachel Lucas        MRN#: [id number]  Clarification of the type of atrial fibrillation:  Paroxysmal atrial fibrillation

## 2024-09-19 DEATH — deceased

## 2024-09-27 LAB — BACTERIAL ORGANISM REFLEX

## 2024-10-02 ENCOUNTER — Ambulatory Visit: Admitting: Physician Assistant

## 2024-10-04 LAB — FUNGUS CULTURE WITH STAIN

## 2024-10-04 LAB — FUNGAL ORGANISM REFLEX

## 2024-10-04 LAB — FUNGUS CULTURE RESULT

## 2024-10-11 MED FILL — Medication: Qty: 1 | Status: AC

## 2024-10-16 LAB — ANAEROBIC BACTERIA, ID BY SEQ.

## 2024-10-17 LAB — CULTURE, BLOOD (ROUTINE X 2)

## 2024-10-18 LAB — SUSCEPTIBILITY RESULT

## 2024-10-18 LAB — SUSCEPTIBILITY, AER + ANAEROB

## 2024-10-22 LAB — ORGANISM ID BY SEQUENCING

## 2024-10-22 LAB — ORGANISM ID, BACTERIA
